# Patient Record
Sex: Female | Born: 1940 | Race: White | Hispanic: No | Marital: Single | Smoking: Never smoker
Health system: Southern US, Community
[De-identification: ages and names within clinical notes are randomized; demographics above are authoritative.]

## PROBLEM LIST (undated history)

## (undated) DIAGNOSIS — I639 Cerebral infarction, unspecified: Secondary | ICD-10-CM

## (undated) DIAGNOSIS — E039 Hypothyroidism, unspecified: Secondary | ICD-10-CM

## (undated) DIAGNOSIS — F411 Generalized anxiety disorder: Secondary | ICD-10-CM

## (undated) DIAGNOSIS — R259 Unspecified abnormal involuntary movements: Secondary | ICD-10-CM

## (undated) DIAGNOSIS — M6530 Trigger finger, unspecified finger: Secondary | ICD-10-CM

## (undated) DIAGNOSIS — M1990 Unspecified osteoarthritis, unspecified site: Secondary | ICD-10-CM

## (undated) DIAGNOSIS — Z780 Asymptomatic menopausal state: Secondary | ICD-10-CM

## (undated) DIAGNOSIS — G2581 Restless legs syndrome: Secondary | ICD-10-CM

## (undated) DIAGNOSIS — R51 Headache: Secondary | ICD-10-CM

## (undated) DIAGNOSIS — R000 Tachycardia, unspecified: Secondary | ICD-10-CM

## (undated) DIAGNOSIS — H269 Unspecified cataract: Secondary | ICD-10-CM

## (undated) DIAGNOSIS — L309 Dermatitis, unspecified: Secondary | ICD-10-CM

## (undated) DIAGNOSIS — H9190 Unspecified hearing loss, unspecified ear: Secondary | ICD-10-CM

## (undated) DIAGNOSIS — F70 Mild intellectual disabilities: Secondary | ICD-10-CM

## (undated) DIAGNOSIS — L03039 Cellulitis of unspecified toe: Secondary | ICD-10-CM

## (undated) DIAGNOSIS — E785 Hyperlipidemia, unspecified: Secondary | ICD-10-CM

## (undated) DIAGNOSIS — I10 Essential (primary) hypertension: Secondary | ICD-10-CM

## (undated) HISTORY — DX: Generalized anxiety disorder: F41.1

## (undated) HISTORY — DX: Trigger finger, unspecified finger: M65.30

## (undated) HISTORY — DX: Tachycardia, unspecified: R00.0

## (undated) HISTORY — DX: Cellulitis of unspecified toe: L03.039

## (undated) HISTORY — PX: BELPHAROPTOSIS REPAIR: SHX369

## (undated) HISTORY — DX: Unspecified osteoarthritis, unspecified site: M19.90

## (undated) HISTORY — DX: Restless legs syndrome: G25.81

## (undated) HISTORY — DX: Dermatitis, unspecified: L30.9

## (undated) HISTORY — DX: Headache: R51

## (undated) HISTORY — PX: CATARACT EXTRACTION, BILATERAL: SHX1313

## (undated) HISTORY — DX: Cerebral infarction, unspecified: I63.9

## (undated) HISTORY — DX: Asymptomatic menopausal state: Z78.0

## (undated) HISTORY — DX: Hypothyroidism, unspecified: E03.9

## (undated) HISTORY — DX: Unspecified abnormal involuntary movements: R25.9

## (undated) HISTORY — DX: Hyperlipidemia, unspecified: E78.5

## (undated) HISTORY — DX: Essential (primary) hypertension: I10

## (undated) HISTORY — DX: Unspecified hearing loss, unspecified ear: H91.90

## (undated) HISTORY — PX: COLONOSCOPY: SHX174

## (undated) HISTORY — DX: Unspecified cataract: H26.9

## (undated) HISTORY — DX: Mild intellectual disabilities: F70

---

## 2002-06-06 ENCOUNTER — Encounter: Admission: RE | Admit: 2002-06-06 | Discharge: 2002-06-06 | Payer: Self-pay | Admitting: Endocrinology

## 2002-06-06 ENCOUNTER — Encounter: Payer: Self-pay | Admitting: Endocrinology

## 2004-08-11 ENCOUNTER — Encounter: Payer: Self-pay | Admitting: Endocrinology

## 2005-07-29 ENCOUNTER — Ambulatory Visit: Payer: Self-pay | Admitting: Endocrinology

## 2006-05-03 ENCOUNTER — Ambulatory Visit: Payer: Self-pay | Admitting: Endocrinology

## 2006-05-10 ENCOUNTER — Ambulatory Visit: Payer: Self-pay | Admitting: Internal Medicine

## 2006-05-21 ENCOUNTER — Ambulatory Visit: Payer: Self-pay | Admitting: Internal Medicine

## 2006-05-31 ENCOUNTER — Ambulatory Visit: Payer: Self-pay | Admitting: Endocrinology

## 2006-06-04 ENCOUNTER — Ambulatory Visit: Payer: Self-pay | Admitting: Internal Medicine

## 2006-06-04 LAB — HM COLONOSCOPY

## 2006-08-25 ENCOUNTER — Ambulatory Visit: Payer: Self-pay | Admitting: Endocrinology

## 2006-10-13 ENCOUNTER — Ambulatory Visit: Payer: Self-pay | Admitting: Endocrinology

## 2006-12-01 ENCOUNTER — Ambulatory Visit: Payer: Self-pay | Admitting: Endocrinology

## 2006-12-06 ENCOUNTER — Encounter: Payer: Self-pay | Admitting: Endocrinology

## 2006-12-06 LAB — CONVERTED CEMR LAB
Metaneph Total, Ur: 142 ug/24hr (ref 95–475)
Metanephrines, Ur: 24 (ref 19–140)
Norepinephrine 24 Hr Urine: 15 mcg/24hr (ref <80)
Normetanephrine, 24H Ur: 118 (ref 52–310)

## 2007-01-05 ENCOUNTER — Ambulatory Visit: Payer: Self-pay | Admitting: Endocrinology

## 2007-04-06 ENCOUNTER — Ambulatory Visit: Payer: Self-pay | Admitting: Endocrinology

## 2007-04-27 ENCOUNTER — Ambulatory Visit: Payer: Self-pay | Admitting: Endocrinology

## 2007-05-11 ENCOUNTER — Ambulatory Visit: Payer: Self-pay | Admitting: Endocrinology

## 2007-06-06 ENCOUNTER — Ambulatory Visit: Payer: Self-pay | Admitting: Endocrinology

## 2007-06-22 ENCOUNTER — Encounter: Payer: Self-pay | Admitting: Endocrinology

## 2007-06-22 DIAGNOSIS — E785 Hyperlipidemia, unspecified: Secondary | ICD-10-CM

## 2007-06-22 HISTORY — DX: Hyperlipidemia, unspecified: E78.5

## 2007-07-06 ENCOUNTER — Ambulatory Visit: Payer: Self-pay | Admitting: Endocrinology

## 2007-08-26 ENCOUNTER — Ambulatory Visit (HOSPITAL_COMMUNITY): Payer: Self-pay | Admitting: Psychiatry

## 2007-09-19 ENCOUNTER — Ambulatory Visit: Payer: Self-pay | Admitting: Endocrinology

## 2007-10-10 ENCOUNTER — Ambulatory Visit: Payer: Self-pay | Admitting: Endocrinology

## 2007-10-10 DIAGNOSIS — F411 Generalized anxiety disorder: Secondary | ICD-10-CM | POA: Insufficient documentation

## 2007-10-10 HISTORY — DX: Generalized anxiety disorder: F41.1

## 2007-11-09 ENCOUNTER — Encounter: Payer: Self-pay | Admitting: Internal Medicine

## 2007-11-21 ENCOUNTER — Ambulatory Visit: Payer: Self-pay | Admitting: Endocrinology

## 2007-11-21 DIAGNOSIS — R259 Unspecified abnormal involuntary movements: Secondary | ICD-10-CM

## 2007-11-21 HISTORY — DX: Unspecified abnormal involuntary movements: R25.9

## 2007-12-19 ENCOUNTER — Encounter: Payer: Self-pay | Admitting: Endocrinology

## 2007-12-29 ENCOUNTER — Encounter: Payer: Self-pay | Admitting: Endocrinology

## 2008-01-16 ENCOUNTER — Ambulatory Visit: Payer: Self-pay | Admitting: Endocrinology

## 2008-02-27 ENCOUNTER — Ambulatory Visit: Payer: Self-pay | Admitting: Endocrinology

## 2008-04-25 ENCOUNTER — Telehealth: Payer: Self-pay | Admitting: Endocrinology

## 2008-05-07 ENCOUNTER — Ambulatory Visit: Payer: Self-pay | Admitting: Endocrinology

## 2008-05-07 DIAGNOSIS — I10 Essential (primary) hypertension: Secondary | ICD-10-CM

## 2008-05-07 HISTORY — DX: Essential (primary) hypertension: I10

## 2008-05-28 ENCOUNTER — Ambulatory Visit: Payer: Self-pay | Admitting: Endocrinology

## 2008-07-02 ENCOUNTER — Ambulatory Visit: Payer: Self-pay | Admitting: Endocrinology

## 2008-07-23 ENCOUNTER — Encounter: Payer: Self-pay | Admitting: Endocrinology

## 2008-08-06 ENCOUNTER — Ambulatory Visit: Payer: Self-pay | Admitting: Endocrinology

## 2008-08-06 ENCOUNTER — Encounter: Payer: Self-pay | Admitting: Internal Medicine

## 2008-08-27 ENCOUNTER — Ambulatory Visit: Payer: Self-pay | Admitting: Endocrinology

## 2008-08-27 DIAGNOSIS — R519 Headache, unspecified: Secondary | ICD-10-CM | POA: Insufficient documentation

## 2008-08-27 DIAGNOSIS — R51 Headache: Secondary | ICD-10-CM

## 2008-08-27 DIAGNOSIS — R000 Tachycardia, unspecified: Secondary | ICD-10-CM

## 2008-08-27 HISTORY — DX: Tachycardia, unspecified: R00.0

## 2008-08-27 HISTORY — DX: Headache: R51

## 2008-09-04 ENCOUNTER — Encounter (INDEPENDENT_AMBULATORY_CARE_PROVIDER_SITE_OTHER): Payer: Self-pay | Admitting: *Deleted

## 2008-09-06 ENCOUNTER — Encounter: Payer: Self-pay | Admitting: Endocrinology

## 2008-09-12 ENCOUNTER — Encounter: Payer: Self-pay | Admitting: Endocrinology

## 2008-09-14 ENCOUNTER — Telehealth: Payer: Self-pay | Admitting: Endocrinology

## 2008-09-14 ENCOUNTER — Encounter (INDEPENDENT_AMBULATORY_CARE_PROVIDER_SITE_OTHER): Payer: Self-pay | Admitting: *Deleted

## 2008-10-01 ENCOUNTER — Ambulatory Visit: Payer: Self-pay | Admitting: Endocrinology

## 2008-10-01 DIAGNOSIS — G2581 Restless legs syndrome: Secondary | ICD-10-CM

## 2008-10-01 HISTORY — DX: Restless legs syndrome: G25.81

## 2008-11-05 ENCOUNTER — Encounter: Payer: Self-pay | Admitting: Endocrinology

## 2008-11-12 ENCOUNTER — Encounter: Admission: RE | Admit: 2008-11-12 | Discharge: 2008-11-12 | Payer: Self-pay | Admitting: Neurology

## 2008-12-03 ENCOUNTER — Encounter: Payer: Self-pay | Admitting: Endocrinology

## 2008-12-07 ENCOUNTER — Telehealth: Payer: Self-pay | Admitting: Endocrinology

## 2008-12-07 ENCOUNTER — Encounter (INDEPENDENT_AMBULATORY_CARE_PROVIDER_SITE_OTHER): Payer: Self-pay | Admitting: *Deleted

## 2009-02-18 ENCOUNTER — Encounter: Payer: Self-pay | Admitting: Endocrinology

## 2009-03-14 ENCOUNTER — Encounter: Payer: Self-pay | Admitting: Endocrinology

## 2009-03-22 ENCOUNTER — Ambulatory Visit: Payer: Self-pay | Admitting: Endocrinology

## 2009-04-15 ENCOUNTER — Ambulatory Visit: Payer: Self-pay | Admitting: Endocrinology

## 2009-04-15 DIAGNOSIS — Z780 Asymptomatic menopausal state: Secondary | ICD-10-CM

## 2009-04-15 DIAGNOSIS — R9431 Abnormal electrocardiogram [ECG] [EKG]: Secondary | ICD-10-CM

## 2009-04-15 DIAGNOSIS — E039 Hypothyroidism, unspecified: Secondary | ICD-10-CM | POA: Insufficient documentation

## 2009-04-15 HISTORY — DX: Asymptomatic menopausal state: Z78.0

## 2009-04-15 HISTORY — DX: Hypothyroidism, unspecified: E03.9

## 2009-05-06 ENCOUNTER — Ambulatory Visit: Payer: Self-pay

## 2009-05-06 ENCOUNTER — Encounter: Payer: Self-pay | Admitting: Endocrinology

## 2009-07-08 ENCOUNTER — Ambulatory Visit: Payer: Self-pay | Admitting: Endocrinology

## 2009-09-02 ENCOUNTER — Telehealth: Payer: Self-pay | Admitting: Endocrinology

## 2009-09-03 ENCOUNTER — Telehealth: Payer: Self-pay | Admitting: Endocrinology

## 2009-10-30 ENCOUNTER — Ambulatory Visit: Payer: Self-pay | Admitting: Endocrinology

## 2009-10-30 DIAGNOSIS — M6530 Trigger finger, unspecified finger: Secondary | ICD-10-CM | POA: Insufficient documentation

## 2009-10-30 HISTORY — DX: Trigger finger, unspecified finger: M65.30

## 2009-10-30 LAB — CONVERTED CEMR LAB
Cholesterol: 306 mg/dL — ABNORMAL HIGH (ref 0–200)
Direct LDL: 164.3 mg/dL
HDL: 44.7 mg/dL (ref >39.00)
TSH: 6.96 microintl units/mL — ABNORMAL HIGH (ref 0.35–5.50)
Total CHOL/HDL Ratio: 7
Triglycerides: 429 mg/dL — ABNORMAL HIGH (ref 0.0–149.0)
VLDL: 85.8 mg/dL — ABNORMAL HIGH (ref 0.0–40.0)

## 2009-11-06 ENCOUNTER — Telehealth: Payer: Self-pay | Admitting: Endocrinology

## 2009-12-23 ENCOUNTER — Ambulatory Visit: Payer: Self-pay | Admitting: Endocrinology

## 2009-12-23 DIAGNOSIS — L03039 Cellulitis of unspecified toe: Secondary | ICD-10-CM

## 2009-12-23 HISTORY — DX: Cellulitis of unspecified toe: L03.039

## 2010-04-10 ENCOUNTER — Telehealth: Payer: Self-pay | Admitting: Endocrinology

## 2010-07-01 ENCOUNTER — Encounter: Payer: Self-pay | Admitting: Endocrinology

## 2010-07-04 ENCOUNTER — Ambulatory Visit: Payer: Self-pay | Admitting: Endocrinology

## 2010-07-07 ENCOUNTER — Telehealth: Payer: Self-pay | Admitting: Internal Medicine

## 2010-07-17 ENCOUNTER — Encounter (INDEPENDENT_AMBULATORY_CARE_PROVIDER_SITE_OTHER): Payer: Self-pay | Admitting: *Deleted

## 2010-08-07 ENCOUNTER — Telehealth: Payer: Self-pay | Admitting: Endocrinology

## 2010-08-13 ENCOUNTER — Encounter: Payer: Self-pay | Admitting: Endocrinology

## 2010-08-28 ENCOUNTER — Encounter: Payer: Self-pay | Admitting: Endocrinology

## 2010-09-04 ENCOUNTER — Encounter: Payer: Self-pay | Admitting: Endocrinology

## 2010-09-08 ENCOUNTER — Encounter (INDEPENDENT_AMBULATORY_CARE_PROVIDER_SITE_OTHER): Payer: Self-pay | Admitting: *Deleted

## 2010-09-09 ENCOUNTER — Encounter (INDEPENDENT_AMBULATORY_CARE_PROVIDER_SITE_OTHER): Payer: Self-pay | Admitting: *Deleted

## 2010-09-09 ENCOUNTER — Telehealth: Payer: Self-pay | Admitting: Endocrinology

## 2010-09-15 ENCOUNTER — Ambulatory Visit: Payer: Self-pay | Admitting: Endocrinology

## 2010-09-18 ENCOUNTER — Encounter: Payer: Self-pay | Admitting: Endocrinology

## 2010-09-19 ENCOUNTER — Telehealth: Payer: Self-pay | Admitting: Endocrinology

## 2010-09-19 ENCOUNTER — Encounter (INDEPENDENT_AMBULATORY_CARE_PROVIDER_SITE_OTHER): Payer: Self-pay | Admitting: *Deleted

## 2010-09-22 ENCOUNTER — Encounter: Payer: Self-pay | Admitting: Endocrinology

## 2010-09-25 ENCOUNTER — Encounter: Payer: Self-pay | Admitting: Endocrinology

## 2010-10-02 ENCOUNTER — Encounter: Payer: Self-pay | Admitting: Endocrinology

## 2010-10-03 ENCOUNTER — Encounter: Payer: Self-pay | Admitting: Endocrinology

## 2010-10-07 ENCOUNTER — Encounter: Payer: Self-pay | Admitting: Endocrinology

## 2010-10-08 ENCOUNTER — Encounter (INDEPENDENT_AMBULATORY_CARE_PROVIDER_SITE_OTHER): Payer: Self-pay | Admitting: *Deleted

## 2010-10-08 ENCOUNTER — Encounter: Payer: Self-pay | Admitting: Endocrinology

## 2010-10-13 ENCOUNTER — Encounter: Payer: Self-pay | Admitting: Endocrinology

## 2010-10-13 ENCOUNTER — Encounter (INDEPENDENT_AMBULATORY_CARE_PROVIDER_SITE_OTHER): Payer: Self-pay | Admitting: *Deleted

## 2010-10-14 ENCOUNTER — Encounter (INDEPENDENT_AMBULATORY_CARE_PROVIDER_SITE_OTHER): Payer: Self-pay | Admitting: *Deleted

## 2010-10-15 ENCOUNTER — Encounter (INDEPENDENT_AMBULATORY_CARE_PROVIDER_SITE_OTHER): Payer: Self-pay | Admitting: *Deleted

## 2010-10-20 ENCOUNTER — Encounter: Payer: Self-pay | Admitting: Endocrinology

## 2010-10-22 ENCOUNTER — Encounter (INDEPENDENT_AMBULATORY_CARE_PROVIDER_SITE_OTHER): Payer: Self-pay | Admitting: *Deleted

## 2010-10-23 ENCOUNTER — Ambulatory Visit: Payer: Self-pay | Admitting: Endocrinology

## 2010-10-24 ENCOUNTER — Encounter: Payer: Self-pay | Admitting: Endocrinology

## 2010-10-24 ENCOUNTER — Encounter (INDEPENDENT_AMBULATORY_CARE_PROVIDER_SITE_OTHER): Payer: Self-pay | Admitting: *Deleted

## 2010-10-24 LAB — CONVERTED CEMR LAB: TSH: 1.38 microintl units/mL (ref 0.35–5.50)

## 2010-10-27 ENCOUNTER — Encounter: Payer: Self-pay | Admitting: Endocrinology

## 2010-10-27 ENCOUNTER — Encounter (INDEPENDENT_AMBULATORY_CARE_PROVIDER_SITE_OTHER): Payer: Self-pay | Admitting: *Deleted

## 2010-10-28 ENCOUNTER — Encounter: Payer: Self-pay | Admitting: Endocrinology

## 2010-10-28 ENCOUNTER — Encounter (INDEPENDENT_AMBULATORY_CARE_PROVIDER_SITE_OTHER): Payer: Self-pay | Admitting: *Deleted

## 2010-10-29 ENCOUNTER — Encounter: Payer: Self-pay | Admitting: Endocrinology

## 2010-10-29 ENCOUNTER — Encounter (INDEPENDENT_AMBULATORY_CARE_PROVIDER_SITE_OTHER): Payer: Self-pay | Admitting: *Deleted

## 2010-10-31 ENCOUNTER — Encounter: Payer: Self-pay | Admitting: Endocrinology

## 2010-10-31 ENCOUNTER — Encounter (INDEPENDENT_AMBULATORY_CARE_PROVIDER_SITE_OTHER): Payer: Self-pay | Admitting: *Deleted

## 2010-11-03 ENCOUNTER — Encounter: Payer: Self-pay | Admitting: Endocrinology

## 2010-11-04 ENCOUNTER — Encounter: Payer: Self-pay | Admitting: Endocrinology

## 2010-11-04 ENCOUNTER — Encounter (INDEPENDENT_AMBULATORY_CARE_PROVIDER_SITE_OTHER): Payer: Self-pay | Admitting: *Deleted

## 2010-11-06 ENCOUNTER — Encounter (INDEPENDENT_AMBULATORY_CARE_PROVIDER_SITE_OTHER): Payer: Self-pay | Admitting: *Deleted

## 2010-11-06 ENCOUNTER — Encounter: Payer: Self-pay | Admitting: Endocrinology

## 2010-11-25 ENCOUNTER — Encounter: Payer: Self-pay | Admitting: Endocrinology

## 2010-12-01 ENCOUNTER — Encounter: Payer: Self-pay | Admitting: Endocrinology

## 2010-12-02 ENCOUNTER — Telehealth: Payer: Self-pay | Admitting: Endocrinology

## 2010-12-02 ENCOUNTER — Encounter (INDEPENDENT_AMBULATORY_CARE_PROVIDER_SITE_OTHER): Payer: Self-pay | Admitting: *Deleted

## 2010-12-05 ENCOUNTER — Encounter (INDEPENDENT_AMBULATORY_CARE_PROVIDER_SITE_OTHER): Payer: Self-pay | Admitting: *Deleted

## 2010-12-05 ENCOUNTER — Encounter: Payer: Self-pay | Admitting: Endocrinology

## 2010-12-10 ENCOUNTER — Encounter: Payer: Self-pay | Admitting: Endocrinology

## 2010-12-15 ENCOUNTER — Encounter (INDEPENDENT_AMBULATORY_CARE_PROVIDER_SITE_OTHER): Payer: Self-pay | Admitting: *Deleted

## 2010-12-19 ENCOUNTER — Ambulatory Visit: Admit: 2010-12-19 | Payer: Self-pay | Admitting: Endocrinology

## 2010-12-21 LAB — CONVERTED CEMR LAB
BUN: 17 mg/dL (ref 6–23)
CO2: 33 meq/L — ABNORMAL HIGH (ref 19–32)
Calcium: 9.2 mg/dL (ref 8.4–10.5)
Chloride: 104 meq/L (ref 96–112)
Cholesterol: 297 mg/dL — ABNORMAL HIGH (ref 0–200)
Creatinine, Ser: 0.8 mg/dL (ref 0.4–1.2)
Direct LDL: 149.2 mg/dL
GFR calc non Af Amer: 76.26 mL/min (ref >60)
Glucose, Bld: 102 mg/dL — ABNORMAL HIGH (ref 70–99)
HDL: 46.2 mg/dL (ref >39.00)
Potassium: 4 meq/L (ref 3.5–5.1)
Sodium: 141 meq/L (ref 135–145)
TSH: 7.74 microintl units/mL — ABNORMAL HIGH (ref 0.35–5.50)
Total CHOL/HDL Ratio: 6
Triglycerides: 562 mg/dL — ABNORMAL HIGH (ref 0.0–149.0)
VLDL: 112.4 mg/dL — ABNORMAL HIGH (ref 0.0–40.0)

## 2010-12-23 NOTE — Letter (Signed)
Summary: Generic Letter  [facility]  [address] [place], [place] [address]   Phone: [phone]  Fax: [phone]    10/28/2010  Laura [name] [address] [place], [place]  [address]  Dear [name],   Dr. [name] wants you to continue to follow the advise of your neurologist, Dr. Terrace Arabia.        Sincerely,   Brenton Grills CMA (AAMA)

## 2010-12-23 NOTE — Letter (Signed)
Summary: Letter from patient regarding medications  Letter from patient regarding medications   Imported By: Sherian Rein 10/27/2010 07:49:44  _____________________________________________________________________  External Attachment:    Type:   Image     Comment:   External Document

## 2010-12-23 NOTE — Letter (Signed)
Summary: Generic Letter  [facility]  [address] [place], [place] [address]   Phone: [phone]  Fax: [phone]    10/13/2010  LAHNA NATH [address] [place], [place]  [address]  Dear [name],    Dr. [name] wants you to follow the advice of your neurologist, Dr. Terrace Arabia.      Sincerely,   Brenton Grills CMA (AAMA)/Dr. Romero Belling

## 2010-12-23 NOTE — Letter (Signed)
Summary: Letter from patient  Letter from patient   Imported By: Lester Valinda 10/06/2010 08:53:47  _____________________________________________________________________  External Attachment:    Type:   Image     Comment:   External Document

## 2010-12-23 NOTE — Progress Notes (Signed)
Summary: rx refill req  Phone Note Call from Patient Call back at Home Phone [phone]   Caller: Patient Summary of Call: Pt faxed letter requesting to have Requip and Temazepam refilled at [facility]. Per pharmacy they are unsure which medication to fill for pt *see phone note 08/15 and 09/15*. Please advise on correct medication and dosage. Initial call taken by: Margaret Pyle, CMA,  September 09, 2010 1:47 PM  Follow-up for Phone Call        i updated med list, per advice of neurologist.  i have printed a refill of temazepam.  ov needed for thyroi. Follow-up by: Minus Breeding MD,  September 09, 2010 2:22 PM  Additional Follow-up for Phone Call Additional follow up Details #1::        rx sent to [facility]. Pt informed by fax per her request. Additional Follow-up by: Brenton Grills MA,  September 09, 2010 3:25 PM    New/Updated Medications: NORTRIPTYLINE HCL 10 MG CAPS (NORTRIPTYLINE HCL) 2 tabs at bedtime TRAZODONE HCL 100 MG TABS (TRAZODONE HCL) 1 tab at bedtime Prescriptions: TEMAZEPAM 15 MG CAPS (TEMAZEPAM) 1 tab three times a day as needed for tremor  #90 x 0   Entered and Authorized by:   Minus Breeding MD   Signed by:   Minus Breeding MD on 09/09/2010   Method used:   Print then Give to Patient   RxID:   [id number]

## 2010-12-23 NOTE — Letter (Signed)
Summary: Generic Letter  [facility]  [address] [place], [place] [address]   Phone: [phone]  Fax: [phone]    10/31/2010  Laura [name] [address] [place], [place]  [address]  Dear [name],   Dr. [name] has refilled your prescription for Temazepam. It has been sent to your [facility].        Sincerely,   Brenton Grills CMA (AAMA)

## 2010-12-23 NOTE — Letter (Signed)
Summary: Generic Letter  [facility]  [address] [place], [place] [address]   Phone: [phone]  Fax: [phone]    10/08/2010  Laura [name] [address] [place], [place]  [address]  Dear [name],   Dr. [name] wants you to follow the advice of your neurologist, Dr. Terrace Arabia.        Sincerely,   Brenton Grills CMA (AAMA/)/Dr. Romero Belling

## 2010-12-23 NOTE — Assessment & Plan Note (Signed)
Summary: follow up/interpreter informed/lb   Vital Signs:  Patient profile:   70 year old female Height:      63 inches (160.02 cm) Weight:      129.13 pounds (58.70 kg) BMI:     22.96 O2 Sat:      95 % on Room air Temp:     98.7 degrees F (37.06 degrees C) oral Pulse rate:   96 / minute BP sitting:   136 / 88  (left arm) Cuff size:   regular  Vitals Entered By: Brenton Grills MA (September 15, 2010 1:18 PM)  O2 Flow:  Room air CC: Follow-up visit/aj Is Patient Diabetic? No   CC:  Follow-up visit/aj.  History of Present Illness: pt was noted at neurol to have elevated tsh.  it has been elevated here, but not as high.  pt has never been on thyroid medication.  she c/o dry mouth.    Current Medications (verified): 1)  Multivitamins   Tabs (Multiple Vitamin) .... Take 1 By Mouth Qd 2)  Metoprolol Tartrate 25 Mg  Tabs (Metoprolol Tartrate) .... 1/2 Bid 3)  Temazepam 15 Mg Caps (Temazepam) .Marland Kitchen.. 1 Tab Three Times A Day As Needed For Tremor 4)  Nortriptyline Hcl 10 Mg Caps (Nortriptyline Hcl) .... 2 Tabs At Bedtime 5)  Trazodone Hcl 100 Mg Tabs (Trazodone Hcl) .Marland Kitchen.. 1 Tab At Bedtime 6)  Vitamin E 100 Unit Caps (Vitamin E) .Marland Kitchen.. 1 By Mouth Two Times A Day  Allergies (verified): No Known Drug Allergies  Past History:  Past Medical History: Last updated: 06/22/2007 Hyperlipidemia ( Rx refused) Deaf Mild retardation Dermatitis Tremors Restless legs syndrome  Family History: Reviewed history and no changes required. no thyroid dz  Review of Systems       she denies weight gain/constipation/arthralgias/fatigue/depression  Physical Exam  General:  normal appearance.   Neck:  Supple without thyroid enlargement or tenderness.  Additional Exam:  outside test results are reviewed: tsh=11   Impression & Recommendations:  Problem # 1:  HYPOTHYROIDISM (ICD-244.9) Assessment Deteriorated  Medications Added to Medication List This Visit: 1)  Vitamin E 100 Unit Caps  (Vitamin e) .Marland Kitchen.. 1 by mouth two times a day 2)  Levothyroxine Sodium 50 Mcg Tabs (Levothyroxine sodium) .Marland Kitchen.. 1 tab once daily  Other Orders: Est. Patient Level III ([address])  Patient Instructions: 1)  levothyroxine 50 micrograms/day. 2)  Please schedule a "medicare wellness" appointment in 3 months. Prescriptions: TRAZODONE HCL 100 MG TABS (TRAZODONE HCL) 1 tab at bedtime  #30 x 11   Entered and Authorized by:   Minus Breeding MD   Signed by:   Minus Breeding MD on 09/18/2010   Method used:   Electronically to        [facility] [address] (retail)       [phone]       [place], [place]  [address]       Ph: [phone]       Fax: [phone]   RxID:   [id number] LEVOTHYROXINE SODIUM 50 MCG TABS (LEVOTHYROXINE SODIUM) 1 tab once daily  #30 x 5   Entered and Authorized by:   Minus Breeding MD   Signed by:   Minus Breeding MD on 09/15/2010   Method used:   Electronically to        [facility] [address] (retail)       [phone]  [place], [place]  [address]       Ph: [phone]       Fax: [phone]   RxID:   [id number]    Orders Added: 1)  Est. Patient Level III [[address]]

## 2010-12-23 NOTE — Letter (Signed)
Summary: Generic Letter  [facility]  [address] [place], [place] [address]   Phone: [phone]  Fax: [phone]    10/15/2010  AVAGAIL WHITTLESEY [address] [place], [place]  [address]  Dear [name],   Dr. [name] wants you to continue to follow the advice of your neurologist, Dr. Terrace Arabia.        Sincerely,   Brenton Grills CMA (AAMA)

## 2010-12-23 NOTE — Medication Information (Signed)
Summary: Rx Request/Patient  Rx Request/Patient   Imported By: Sherian Rein 09/15/2010 15:56:40  _____________________________________________________________________  External Attachment:    Type:   Image     Comment:   External Document

## 2010-12-23 NOTE — Letter (Signed)
Summary: Generic Letter  [facility]  [address] [place], [place] [address]   Phone: [phone]  Fax: [phone]    10/29/2010  MIKI LABUDA [address] [place], [place]  [address]  Dear [name],   Dr. Zannie Cove fax number is [phone]. I have called and verified with his office that this fax number is correct.         Sincerely,   Brenton Grills CMA (AAMA)

## 2010-12-23 NOTE — Letter (Signed)
Summary: Generic Letter   [facility]  [address] [place], [place] [address]   Phone: [phone]  Fax: [phone]    09/08/2010  Laura [name] [address] [place], [place]  [address]  Dear [name],   We have received the results of your thyroid test from Dr. Terrace Arabia. Dr. [name] would like you to make an appointment to address the results of your thyroid test. Please contact the office to schedule an appointment with Dr. [name] at your earliest [place]. You can reach [place] at [phone] to schedule an appointment.          Sincerely,   Brenton Grills MA/Dr. Romero Belling

## 2010-12-23 NOTE — Letter (Signed)
Summary: Generic Letter  [facility]  [address] [place], [place] [address]   Phone: [phone]  Fax: [phone]    10/22/2010  Laura [name] [address] [place], [place]  [address]  Dear [name],  Dr. [name] would like you to come in for labwork to check your thyroid. The laboratory is open from 7:30am-5:30pm M-F. You do not need to make an appointment, you can come in anytime that is convenient for you. Dr. [name] also states that you should follow the instructions of your neurologist Dr. Terrace Arabia.         Sincerely,   Brenton Grills CMA (AAMA)

## 2010-12-23 NOTE — Progress Notes (Signed)
Summary: temazepam  Phone Note From Pharmacy   Caller: [facility] [address] [place] Details for Reason: ? temazepam Summary of Call: Pharmacy is calling about temazepam rx that was written on 07/04/10. Is this ok to fill for tremors. Pt is at pharmacy trying to get rx fill. Pharmacist Trey Paula) states this is unusal for pt to take for tremors three times a day. Is md ok filling med. Initial call taken by: Orlan Leavens RMA,  August 07, 2010 9:45 AM  Follow-up for Phone Call        ok Follow-up by: Minus Breeding MD,  August 07, 2010 11:13 AM  Additional Follow-up for Phone Call Additional follow up Details #1::        Notified Trey Paula with md response Additional Follow-up by: Orlan Leavens RMA,  August 07, 2010 11:43 AM

## 2010-12-23 NOTE — Letter (Signed)
Summary: Request for new OV instruction form/Patient  Request for new OV instruction form/Patient   Imported By: Sherian Rein 09/05/2010 09:37:28  _____________________________________________________________________  External Attachment:    Type:   Image     Comment:   External Document

## 2010-12-23 NOTE — Letter (Signed)
Summary: Letter regarding shaking legs/Patient  Letter regarding shaking legs/Patient   Imported By: Sherian Rein 07/08/2010 12:08:03  _____________________________________________________________________  External Attachment:    Type:   Image     Comment:   External Document

## 2010-12-23 NOTE — Letter (Signed)
Summary: ELAM Consult Scheduled Letter  [facility]  [address] [place], [place] [address]   Phone: [phone]  Fax: [phone]      07/17/2010 MRN: [id number]  ADEA GEISEL [address] [place], [place]  [address]    Dear [name],      We have scheduled an appointment for you.  At the recommendation of Dr.Ellsion, we have scheduled you a consult with Dr.Yan on 08-25-2010 at 1:30pm.Their phone number is [phone].If this appointment day and time is not convenient for you, please feel free to call the office of the doctor you are being referred to at the number listed above and reschedule the appointment.  [facility]   [address], [place], [place] [address]  Thank you,  Patient Care Coordinator  [facility]

## 2010-12-23 NOTE — Letter (Signed)
Summary: Letter faxed regarding medication/Patient  Letter faxed regarding medication/Patient   Imported By: Sherian Rein 10/30/2010 12:16:16  _____________________________________________________________________  External Attachment:    Type:   Image     Comment:   External Document

## 2010-12-23 NOTE — Letter (Signed)
Summary: Generic Letter  [facility]  [address] [place], [place] [address]   Phone: [phone]  Fax: [phone]    09/09/2010  SHETARA LAUNER [address] [place], [place]  [address]  Dear [name],  We have sent a prescription for Temezepam to your [facility] pharmacy. Your prescription for Metoprolol was sent last week and should be ready to pick up. Please contact the office to schedule an appointment with Dr. [name] to follow up on your thyroid.         Sincerely,   Brenton Grills MA/Dr.Sean [name]

## 2010-12-23 NOTE — Assessment & Plan Note (Signed)
Summary: RIGHT GREAT TOE HURTING AND SWOLLEN-LB-INTERPRETER SCH-LB   Vital Signs:  Patient profile:   70 year old female Height:      63 inches (160.02 cm) Weight:      127.50 pounds (57.95 kg) O2 Sat:      96 % on Room air Temp:     97.3 degrees F (36.28 degrees C) oral Pulse rate:   96 / minute BP sitting:   122 / 80  (left arm) Cuff size:   regular  Vitals Entered By: Josph Macho CMA (December 23, 2009 1:05 PM)  O2 Flow:  Room air CC: Right great toe hurting and swollen/ CF Is Patient Diabetic? No   CC:  Right great toe hurting and swollen/ CF.  History of Present Illness: 3 days of pain at the right great toe paronychial area.    Current Medications (verified): 1)  Multivitamins   Tabs (Multiple Vitamin) .... Take 1 By Mouth Qd 2)  Metoprolol Tartrate 25 Mg  Tabs (Metoprolol Tartrate) .... 1/2 Bid 3)  Clonazepam 0.5 Mg Tabs (Clonazepam) .Marland Kitchen.. 1 Three Times A Day As Needed For Tremor  Allergies (verified): No Known Drug Allergies  Past History:  Past Medical History: Last updated: 06/22/2007 Hyperlipidemia ( Rx refused) Deaf Mild retardation Dermatitis Tremors Restless legs syndrome  Review of Systems  The patient denies fever.    Physical Exam  General:  normal appearance.   Extremities:  left great toe paronychial area:  has slight erythema and swelling, but no drainage.  the nail in ingrown.     Impression & Recommendations:  Problem # 1:  PARONYCHIA, RIGHT GREAT TOE (ICD-681.11)  Medications Added to Medication List This Visit: 1)  Cephalexin 250 Mg Caps (Cephalexin) .Marland Kitchen.. 1 tid  Other Orders: Podiatry Referral (Podiatry) Est. Patient Level III ([address])  Patient Instructions: 1)  cephalexin 250 mg two times a day 2)  refer podiatry. Prescriptions: CEPHALEXIN 250 MG CAPS (CEPHALEXIN) 1 tid  #21 x 0   Entered and Authorized by:   Minus Breeding MD   Signed by:   Minus Breeding MD on 12/23/2009   Method used:   Electronically to   [facility] [address] (retail)       [phone]       [place], [place]  [address]       Ph: [phone]       Fax: [phone]   RxID:   [id number]

## 2010-12-23 NOTE — Letter (Signed)
Summary: Generic Letter  [facility]  [address] [place], [place] [address]   Phone: [phone]  Fax: [phone]    10/14/2010  Laura [name] [address] [place], [place]  [address]  Dear [name],   Dr. [name] wants you to follow the advice of your neurologist, Dr. Terrace Arabia. Dr. [name] wants you to continue to take Levothyroxine once daily.        Sincerely,   Brenton Grills CMA (AAMA)

## 2010-12-23 NOTE — Letter (Signed)
Summary: Letter faxed by patient regarding medications  Letter faxed by patient regarding medications   Imported By: Sherian Rein 10/29/2010 07:12:46  _____________________________________________________________________  External Attachment:    Type:   Image     Comment:   External Document

## 2010-12-23 NOTE — Consult Note (Signed)
Summary: [facility]  [facility]   Imported By: Lester [place] 09/05/2010 10:45:50  _____________________________________________________________________  External Attachment:    Type:   Image     Comment:   External Document

## 2010-12-23 NOTE — Letter (Signed)
Summary: Generic Letter  [facility]  [address] [address] [place], [place] [address]   Phone: [phone]  Fax: [phone]    09/19/2010  Laura [name] [address] [place], [place]  [address]  Dear [name],    We have sent the prescription for Trazadone to your [facility] pharmacy.       Sincerely,   Brenton Grills CMA (AAMA)/Dr. [name]

## 2010-12-23 NOTE — Miscellaneous (Signed)
   Clinical Lists Changes  Medications: Removed medication of TRAZODONE HCL 100 MG TABS (TRAZODONE HCL) 1 tab at bedtime

## 2010-12-23 NOTE — Letter (Signed)
Summary: Rx concerns from patient  Rx concerns from patient   Imported By: Lester [place] 10/20/2010 06:58:18  _____________________________________________________________________  External Attachment:    Type:   Image     Comment:   External Document

## 2010-12-23 NOTE — Letter (Signed)
Summary: Letter Re medicine refill  Letter Re medicine refill   Imported By: Lester [place] 09/22/2010 10:25:37  _____________________________________________________________________  External Attachment:    Type:   Image     Comment:   External Document

## 2010-12-23 NOTE — Letter (Signed)
Summary: Generic Letter  [facility]  [address] [place], [place] [address]   Phone: [phone]  Fax: [phone]    10/27/2010  Laura [name] [address] [place], [place]  [address]  Dear [name],   Dr. [name] advises that you schedule a follow up appointment with Dr. Terrace Arabia regarding your concerns.        Sincerely,   Brenton Grills CMA (AAMA)

## 2010-12-23 NOTE — Assessment & Plan Note (Signed)
Summary: FU/ LEGS ARE SHAKEY AND NERVOUS/CLONAZEPAM DIDN'T WORK/NWS   Vital Signs:  Patient profile:   70 year old female Height:      63 inches (160.02 cm) Weight:      131.25 pounds (59.66 kg) BMI:     23.33 O2 Sat:      97 % on Room air Temp:     98.0 degrees F (36.67 degrees C) oral Pulse rate:   73 / minute BP sitting:   130 / 82  (left arm) Cuff size:   regular  Vitals Entered By: Brenton Grills MA (July 04, 2010 1:59 PM)  O2 Flow:  Room air CC: Pt c/o restless legs at all times, states that Clonazepem is not working for her/pt is no longer taking Cephalexin/aj   CC:  Pt c/o restless legs at all times and states that Clonazepem is not working for her/pt is no longer taking Cephalexin/aj.  History of Present Illness: pt says klonopin does not help tremor, and causes drowsiness.    Current Medications (verified): 1)  Multivitamins   Tabs (Multiple Vitamin) .... Take 1 By Mouth Qd 2)  Metoprolol Tartrate 25 Mg  Tabs (Metoprolol Tartrate) .... 1/2 Bid 3)  Clonazepam 0.5 Mg Tabs (Clonazepam) .Marland Kitchen.. 1 Three Times A Day As Needed For Tremor 4)  Cephalexin 250 Mg Caps (Cephalexin) .Marland Kitchen.. 1 Tid  Allergies (verified): No Known Drug Allergies  Past History:  Past Medical History: Last updated: 06/22/2007 Hyperlipidemia ( Rx refused) Deaf Mild retardation Dermatitis Tremors Restless legs syndrome  Review of Systems       she has anxiety.  Physical Exam  General:  normal appearance.   Neurologic:  minimal tremor   Impression & Recommendations:  Problem # 1:  TREMOR (ICD-781.0) needs increased rx  Medications Added to Medication List This Visit: 1)  Temazepam 15 Mg Caps (Temazepam) .Marland Kitchen.. 1 tab three times a day as needed for tremor  Other Orders: Neurology Referral (Neuro) Est. Patient Level III ([address])  Patient Instructions: 1)  refer neurol. 2)  in the meantime, change clonazepam to temazepam 15 mg three times a day as needed for  tremor. Prescriptions: TEMAZEPAM 15 MG CAPS (TEMAZEPAM) 1 tab three times a day as needed for tremor  #90 x 1   Entered and Authorized by:   Minus Breeding MD   Signed by:   Minus Breeding MD on 07/04/2010   Method used:   Print then Give to Patient   RxID:   [id number]

## 2010-12-23 NOTE — Letter (Signed)
Summary: Letter from patient  Letter from patient   Imported By: Lester [place] 10/21/2010 11:55:09  _____________________________________________________________________  External Attachment:    Type:   Image     Comment:   External Document

## 2010-12-23 NOTE — Progress Notes (Signed)
Summary: refill req  Phone Note Call from Patient Call back at Home Phone [phone]   Caller: Patient Summary of Call: Pt sent fax requesting refill for Trazodone Initial call taken by: Brenton Grills CMA [place] Dull),  September 19, 2010 10:21 AM  Follow-up for Phone Call        rx sent to pharmacy, pt informed per letter by fax Follow-up by: Brenton Grills CMA Duncan Dull),  September 19, 2010 10:20 AM

## 2010-12-23 NOTE — Letter (Signed)
Summary: Letter from patient  Letter from patient   Imported By: Lester [place] 10/17/2010 08:53:24  _____________________________________________________________________  External Attachment:    Type:   Image     Comment:   External Document

## 2010-12-23 NOTE — Letter (Signed)
Summary: Letter requesting to stop Trazodine/Patient  Letter requesting to stop Trazodine/Patient   Imported By: Sherian Rein 09/24/2010 11:48:11  _____________________________________________________________________  External Attachment:    Type:   Image     Comment:   External Document

## 2010-12-23 NOTE — Progress Notes (Signed)
Summary: Rx clarificatiopn/SAE pt  Phone Note From Pharmacy   Caller: [facility] [address] [place] Summary of Call: Pharmacy called to verify Rx for Temazepam given to pt three times a day for tremor. Pharmacist states that this is a longer acting drug and is usually used for sleep. Please advise, pharmacist requires clarification before Rx is filled. Initial call taken by: Margaret Pyle, CMA,  July 07, 2010 9:36 AM  Follow-up for Phone Call        this is true, and I agree this would not be a good choice of med for tremor due to risk of somnolence;  pt has been on requip in the past, as well as klonopin  ? ok to re-try requip  ? or consider neuro consult for tremor Follow-up by: Corwin Levins MD,  July 07, 2010 1:19 PM    New/Updated Medications: REQUIP 1 MG TABS (ROPINIROLE HCL) 1 tab by mouth three times a day Please follow up with MD in 30 days Prescriptions: REQUIP 1 MG TABS (ROPINIROLE HCL) 1 tab by mouth three times a day Please follow up with MD in 30 days  #90 x 0   Entered by:   Margaret Pyle, CMA   Authorized by:   Corwin Levins MD   Signed by:   Margaret Pyle, CMA on 07/07/2010   Method used:   Electronically to        [facility] [address] (retail)       [phone]       [place], [place]  [address]       Ph: [phone]       Fax: [phone]   RxID:   [id number]

## 2010-12-23 NOTE — Letter (Signed)
Summary: Letter Regarding Legs & Medication / Patient   Letter Regarding Legs & Medication / Patient   Imported ByLennie Odor 08/26/2010 14:40:00  _____________________________________________________________________  External Attachment:    Type:   Image     Comment:   External Document

## 2010-12-23 NOTE — Letter (Signed)
Summary: Letter from patient  Letter from patient   Imported By: Lester [place] 10/17/2010 08:52:20  _____________________________________________________________________  External Attachment:    Type:   Image     Comment:   External Document

## 2010-12-23 NOTE — Progress Notes (Signed)
  Phone Note Refill Request Message from:  Fax from Pharmacy on Apr 10, 2010 9:43 AM  Refills Requested: Medication #1:  METOPROLOL TARTRATE 25 MG  TABS 1/2 bid Initial call taken by: Ami Bullins CMA,  Apr 10, 2010 9:44 AM    Prescriptions: METOPROLOL TARTRATE 25 MG  TABS (METOPROLOL TARTRATE) 1/2 bid  #30 x 4   Entered by:   Ami Bullins CMA   Authorized by:   Minus Breeding MD   Signed by:   Bill Salinas CMA on 04/10/2010   Method used:   Electronically to        [facility] [address] (retail)       [id number]       [place], [place]  [address]       Ph: [phone]       Fax: [phone]   RxID:   [id number]

## 2010-12-23 NOTE — Letter (Signed)
Summary: Letter regarding medication/Patient  Letter regarding medication/Patient   Imported By: Sherian Rein 10/13/2010 07:36:59  _____________________________________________________________________  External Attachment:    Type:   Image     Comment:   External Document

## 2010-12-23 NOTE — Miscellaneous (Signed)
   Clinical Lists Changes  Medications: Removed medication of NORTRIPTYLINE HCL 10 MG CAPS (NORTRIPTYLINE HCL) 2 tabs at bedtime

## 2010-12-23 NOTE — Letter (Signed)
Summary: Letter regarding wrong fax number/Patient  Letter regarding wrong fax number/Patient   Imported By: Sherian Rein 10/31/2010 12:26:39  _____________________________________________________________________  External Attachment:    Type:   Image     Comment:   External Document

## 2010-12-23 NOTE — Letter (Signed)
Summary: Generic Letter  [facility]  [address] [place], [place] [address]   Phone: [phone]  Fax: [phone]    10/24/2010  KAYLEEANN HUXFORD [address] [place], [place]  [address]  Dear [name],  Your thyroid blood test is normal. Please continue to take the same medication for your thyroid.         Sincerely,   Brenton Grills CMA (AAMA/)/Dr. Romero Belling

## 2010-12-25 NOTE — Letter (Signed)
Summary: Letter from patient  Letter from patient   Imported By: Lester [place] 11/11/2010 11:16:53  _____________________________________________________________________  External Attachment:    Type:   Image     Comment:   External Document

## 2010-12-25 NOTE — Letter (Signed)
Summary: Letter regarding medication/Patient  Letter regarding medication/Patient   Imported By: Sherian Rein 12/15/2010 09:29:54  _____________________________________________________________________  External Attachment:    Type:   Image     Comment:   External Document

## 2010-12-25 NOTE — Progress Notes (Signed)
Summary: rx refill req  Phone Note Call from Patient   Caller: Patient Summary of Call: Pt sent letter regarding requesting refill of Metoprolol Tartrate Initial call taken by: Brenton Grills CMA Duncan Dull),  December 02, 2010 11:49 AM  Follow-up for Phone Call        rx sent to pharmacy-pt informed by letter via fax Follow-up by: Brenton Grills CMA Duncan Dull),  December 02, 2010 11:50 AM    Prescriptions: METOPROLOL TARTRATE 25 MG  TABS (METOPROLOL TARTRATE) 1/2 bid  #30 Each x 5   Entered by:   Brenton Grills CMA (AAMA)   Authorized by:   Minus Breeding MD   Signed by:   Brenton Grills CMA (AAMA) on 12/02/2010   Method used:   Electronically to        [facility] [address] (retail)       [phone]       [place], [place]  [address]       Ph: [phone]       Fax: [phone]   RxID:   [id number]

## 2010-12-25 NOTE — Letter (Signed)
Summary: Generic Letter  [facility]  [address] [place], [place] [address]   Phone: [phone]  Fax: [phone]    12/15/2010  [name] [address] [place], [place]  [address]  Dear [name],   We have reschedule your appointment to January 27th at 1:45pm. If you are unable to make this appointment or if this time is inconvenient for you, please contact our office.        Sincerely,   Brenton Grills CMA (AAMA)

## 2010-12-25 NOTE — Letter (Signed)
Summary: Generic Letter  [facility]  [address] [place], [place] [address]   Phone: [phone]  Fax: [phone]    12/05/2010  Laura [name] [address] [place], [place]  [address]  Dear [name],  Your refill for Temazepam is not due yet. You should still have additional refills of your last prescription Dr. [name] sent in for you. Please contact your pharmacy to see if you have additional refills regarding your Temazepam.         Sincerely,   Brenton Grills CMA (AAMA)

## 2010-12-25 NOTE — Letter (Signed)
Summary: Generic Letter  [facility]  [address] [place], [place] [address]   Phone: [phone]  Fax: [phone]    12/02/2010  SHALAN NEAULT [address] [place], [place]  [address]  Dear [name],   Dr. [name] wants you to continue to follow the instructions of your neurologist, Dr. Terrace Arabia.        Sincerely,   Brenton Grills CMA (AAMA)

## 2010-12-25 NOTE — Letter (Signed)
Summary: Letter faxed regarding medication/Patient  Faxed letter regarding medication/Patient   Imported By: Sherian Rein 11/06/2010 10:13:56  _____________________________________________________________________  External Attachment:    Type:   Image     Comment:   External Document

## 2010-12-25 NOTE — Letter (Signed)
Summary: Generic Letter  [facility]  [address] [place], [place] [address]   Phone: [phone]  Fax: [phone]    12/02/2010  AMYRAH PINKHASOV [address] [place], [place]  [address]  Dear [name],  Your prescription for Metoprolol Tartrate has been sent to your [facility].         Sincerely,   Brenton Grills CMA (AAMA)

## 2010-12-25 NOTE — Letter (Signed)
Summary: Letter regarding medication/Patient  Letter regarding medication/Patient   Imported By: Sherian Rein 12/10/2010 14:19:47  _____________________________________________________________________  External Attachment:    Type:   Image     Comment:   External Document

## 2010-12-25 NOTE — Letter (Signed)
Summary: Letter from patient  Letter from patient   Imported By: Lester [place] 12/08/2010 09:23:24  _____________________________________________________________________  External Attachment:    Type:   Image     Comment:   External Document

## 2010-12-25 NOTE — Letter (Signed)
Summary: Letter regarding Temazepam/Patient  Letter regarding Temazepam/Patient   Imported By: Sherian Rein 11/07/2010 12:40:20  _____________________________________________________________________  External Attachment:    Type:   Image     Comment:   External Document

## 2010-12-25 NOTE — Letter (Signed)
Summary: Letter from patient  Letter from patient   Imported By: Lester Max 12/08/2010 09:22:36  _____________________________________________________________________  External Attachment:    Type:   Image     Comment:   External Document

## 2010-12-25 NOTE — Letter (Signed)
Summary: Generic Letter  [facility]  [address] [place], [place] [address]   Phone: [phone]  Fax: [phone]    11/06/2010  ELLIANAH CORDY [address] [place], [place]  [address]  Dear [name],    Your appointment with Dr. [name] is December 15, 2010 at 1:15pm.       Sincerely,   Brenton Grills CMA [facility])

## 2010-12-25 NOTE — Letter (Signed)
Summary: Letter regarding Temazepam/Patient  Letter regarding Temazepam/Patient   Imported By: Sherian Rein 12/10/2010 11:28:38  _____________________________________________________________________  External Attachment:    Type:   Image     Comment:   External Document

## 2010-12-25 NOTE — Letter (Signed)
Summary: Letter regarding headaches/Patient  Letter regarding headaches/Patient   Imported By: Sherian Rein 11/07/2010 12:38:58  _____________________________________________________________________  External Attachment:    Type:   Image     Comment:   External Document

## 2010-12-25 NOTE — Letter (Signed)
Summary: Generic Letter  [facility]  [address] [place], [place] [address]   Phone: [phone]  Fax: [phone]    11/04/2010  Laura [name] [address] [place], [place]  [address]  Dear [name],   Dr. [name] states that for headache medicine your options are to take Tylenol or to ask Dr. Terrace Arabia about options for headache medicine.        Sincerely,   Brenton Grills CMA (AAMA)

## 2010-12-25 NOTE — Letter (Signed)
Summary: Letter regarding appt/Patient  Letter regarding appt/Patient   Imported By: Sherian Rein 11/12/2010 11:36:19  _____________________________________________________________________  External Attachment:    Type:   Image     Comment:   External Document

## 2011-01-02 ENCOUNTER — Ambulatory Visit (INDEPENDENT_AMBULATORY_CARE_PROVIDER_SITE_OTHER): Payer: Medicare Other | Admitting: Endocrinology

## 2011-01-02 ENCOUNTER — Encounter: Payer: Self-pay | Admitting: Endocrinology

## 2011-01-02 DIAGNOSIS — Z0000 Encounter for general adult medical examination without abnormal findings: Secondary | ICD-10-CM

## 2011-01-08 NOTE — Assessment & Plan Note (Signed)
Summary: MEDICARE WELLNESS/ NWS  INTERPRETER SCHEDULED   Vital Signs:  Patient profile:   70 year old female Height:      63 inches (160.02 cm) Weight:      127.50 pounds (57.95 kg) BMI:     22.67 O2 Sat:      97 % on Room air Temp:     98.0 degrees F (36.67 degrees C) oral Pulse rate:   89 / minute Pulse rhythm:   regular BP sitting:   132 / 80  (left arm) Cuff size:   regular  Vitals Entered By: Brenton Grills CMA Duncan Dull) (January 02, 2011 1:18 PM)  O2 Flow:  Room air CC: Medicare Welllness/pt c/o trembling in legs/aj Is Patient Diabetic? No Comments Pt has not had a recent mammogram or Pap Smear or Zostavax   CC:  Medicare Welllness/pt c/o trembling in legs/aj.  History of Present Illness: pt is here for medicare welllness visit.  she denies memory loss and depression.  she says she is able to perform activities of daily living without assistance.  she has no limitations to physical activity.  Current Medications (verified): 1)  Multivitamins   Tabs (Multiple Vitamin) .... Take 1 By Mouth Qd 2)  Metoprolol Tartrate 25 Mg  Tabs (Metoprolol Tartrate) .... 1/2 Bid 3)  Temazepam 15 Mg Caps (Temazepam) .Marland Kitchen.. 1 Tab Three Times A Day As Needed For Tremor 4)  Vitamin E 100 Unit Caps (Vitamin E) .Marland Kitchen.. 1 By Mouth Two Times A Day 5)  Levothyroxine Sodium 50 Mcg Tabs (Levothyroxine Sodium) .Marland Kitchen.. 1 Tab Once Daily  Allergies (verified): No Known Drug Allergies  Past History:  Past Medical History: Hyperlipidemia ( Rx refused) Deaf Mild retardation Dermatitis Tremors Restless legs syndrome  podiatry: hyatt neurol: yan opthal: dolan  Family History: Reviewed history from 09/15/2010 and no changes required. no thyroid dz  Social History: Reviewed history from 10/01/2008 and no changes required. disabled Never Smoked does not have a phone at home alcohol: none no illegal drugs.   lives alone never married physical activity is very good she has never had a driver's  license  Review of Systems  The patient denies vision loss.         she has severe chronic hearing loss  Physical Exam  General:  normal appearance.   Ears:  (sees opthal) Msk:  pt easily and quickly performs "get-up-and-go" from a sitting position  Psych:  remembers 3/3 at 5 minutes.  excellent recall.  can easily read and write a sentence.  alert and oriented x 3.   Impression & Recommendations:  Problem # 1:  ROUTINE GENERAL MEDICAL EXAM@HEALTH  CARE FACL (ICD-V70.0)  Other Orders: Medicare -1st Annual Wellness Visit [phone])   Patient Instructions: 1)  please consider these measures for your health:  minimize alcohol.  do not use tobacco products.  have a colonoscopy at least every 10 years from age 19.  keep firearms safely stored.  always use seat belts.  have working smoke alarms in your home.  see an eye doctor and dentist regularly.  never drive under the influence of alcohol or drugs (including prescription drugs).  those with fair skin should take precautions against the sun. 2)  please let me know what your wishes would be, if artificial life support measures should become necessary.  it is critically important to prevent falling down (keep floor areas well-lit, dry, and free of loose objects). 3)  please let us know if you decide to get a mammogram, or  osteoporosis x ray. 4)  Please schedule a follow-up appointment in 6 months. 5)  (update: we discussed code status.  pt requests full code, but would not want to be started or maintained on artificial life-support measures if there was not a reasonable chance of recovery)   Orders Added: 1)  Medicare -1st Annual Wellness Visit [[id number]]   Immunization History:  Influenza Immunization History:    Influenza:  historical (08/23/2010)   Immunization History:  Influenza Immunization History:    Influenza:  Historical (08/23/2010)

## 2011-01-25 ENCOUNTER — Encounter: Payer: Self-pay | Admitting: Endocrinology

## 2011-02-10 NOTE — Letter (Signed)
Summary: Letter regarding Temazepam/Patient  Letter regarding Temazepam/Patient   Imported By: Sherian Rein 02/05/2011 12:33:14  _____________________________________________________________________  External Attachment:    Type:   Image     Comment:   External Document

## 2011-03-12 ENCOUNTER — Other Ambulatory Visit: Payer: Self-pay | Admitting: Endocrinology

## 2011-03-20 ENCOUNTER — Encounter: Payer: Self-pay | Admitting: Endocrinology

## 2011-04-09 ENCOUNTER — Encounter: Payer: Self-pay | Admitting: *Deleted

## 2011-05-14 ENCOUNTER — Other Ambulatory Visit: Payer: Self-pay | Admitting: Endocrinology

## 2011-05-14 NOTE — Telephone Encounter (Signed)
Rx faxed to [facility].  

## 2011-05-14 NOTE — Telephone Encounter (Signed)
Please advise 

## 2011-06-03 ENCOUNTER — Other Ambulatory Visit: Payer: Self-pay | Admitting: *Deleted

## 2011-06-03 MED ORDER — METOPROLOL TARTRATE 25 MG PO TABS: ORAL_TABLET | ORAL | Status: DC

## 2011-06-03 NOTE — Telephone Encounter (Signed)
R'cd fax from pt requesting refill of Metoprolol

## 2011-06-22 ENCOUNTER — Encounter: Payer: Self-pay | Admitting: *Deleted

## 2011-06-29 ENCOUNTER — Other Ambulatory Visit (INDEPENDENT_AMBULATORY_CARE_PROVIDER_SITE_OTHER): Payer: Medicare Other

## 2011-06-29 ENCOUNTER — Other Ambulatory Visit: Payer: Self-pay | Admitting: Endocrinology

## 2011-06-29 ENCOUNTER — Ambulatory Visit (INDEPENDENT_AMBULATORY_CARE_PROVIDER_SITE_OTHER): Payer: Medicare Other | Admitting: Endocrinology

## 2011-06-29 ENCOUNTER — Encounter: Payer: Self-pay | Admitting: Endocrinology

## 2011-06-29 VITALS — BP 135/80 | HR 78 | Temp 97.3°F | Ht 63.0 in | Wt 128.2 lb

## 2011-06-29 DIAGNOSIS — R259 Unspecified abnormal involuntary movements: Secondary | ICD-10-CM

## 2011-06-29 DIAGNOSIS — E785 Hyperlipidemia, unspecified: Secondary | ICD-10-CM

## 2011-06-29 DIAGNOSIS — I10 Essential (primary) hypertension: Secondary | ICD-10-CM

## 2011-06-29 DIAGNOSIS — Z79899 Other long term (current) drug therapy: Secondary | ICD-10-CM

## 2011-06-29 DIAGNOSIS — E039 Hypothyroidism, unspecified: Secondary | ICD-10-CM

## 2011-06-29 DIAGNOSIS — R9431 Abnormal electrocardiogram [ECG] [EKG]: Secondary | ICD-10-CM

## 2011-06-29 LAB — TSH: TSH: 1.63 u[IU]/mL (ref 0.35–5.50)

## 2011-06-29 LAB — CBC WITH DIFFERENTIAL/PLATELET
Basophils Absolute: 0 10*3/uL (ref 0.0–0.1)
Basophils Relative: 0.5 % (ref 0.0–3.0)
Eosinophils Absolute: 0.1 10*3/uL (ref 0.0–0.7)
HCT: 39.4 % (ref 36.0–46.0)
Hemoglobin: 13.3 g/dL (ref 12.0–15.0)
Lymphs Abs: 1.7 10*3/uL (ref 0.7–4.0)
MCHC: 33.8 g/dL (ref 30.0–36.0)
MCV: 95.2 fl (ref 78.0–100.0)
Monocytes Absolute: 0.6 10*3/uL (ref 0.1–1.0)
Neutro Abs: 3.5 10*3/uL (ref 1.4–7.7)
RBC: 4.14 Mil/uL (ref 3.87–5.11)
RDW: 12.8 % (ref 11.5–14.6)

## 2011-06-29 LAB — BASIC METABOLIC PANEL
CO2: 27 mEq/L (ref 19–32)
Calcium: 8.7 mg/dL (ref 8.4–10.5)
Chloride: 97 mEq/L (ref 96–112)
Creatinine, Ser: 0.9 mg/dL (ref 0.4–1.2)
Sodium: 134 mEq/L — ABNORMAL LOW (ref 135–145)

## 2011-06-29 LAB — URINALYSIS, ROUTINE W REFLEX MICROSCOPIC
Bilirubin Urine: NEGATIVE
Ketones, ur: NEGATIVE
Specific Gravity, Urine: <=1.005 (ref 1.000–1.030)
Urine Glucose: NEGATIVE
Urobilinogen, UA: 0.2 (ref 0.0–1.0)

## 2011-06-29 LAB — LIPID PANEL
Cholesterol: 290 mg/dL — ABNORMAL HIGH (ref 0–200)
Total CHOL/HDL Ratio: 6
Triglycerides: 574 mg/dL — ABNORMAL HIGH (ref 0.0–149.0)
VLDL: 114.8 mg/dL — ABNORMAL HIGH (ref 0.0–40.0)

## 2011-06-29 LAB — HEPATIC FUNCTION PANEL
ALT: 17 U/L (ref 0–35)
AST: 21 U/L (ref 0–37)
Albumin: 4.1 g/dL (ref 3.5–5.2)
Alkaline Phosphatase: 82 U/L (ref 39–117)

## 2011-06-29 NOTE — Patient Instructions (Addendum)
Stop levothyroxine, on a trial basis. Refer back to neurologist dr Terrace Arabia.  you will receive a phone call, with a day and time for an appointment.   Let's check an "echo" (easy and painless heart test).   Also, please do blood tests today.   (see letter)

## 2011-06-29 NOTE — Progress Notes (Signed)
Subjective:    Patient ID: Laura [name], female    DOB: Aug 16, 1941, 70 y.o.   MRN: [id number]  HPI Pt states few mos of moderate non-vertigenous dizziness sensation in the head, and assoc tremulousness.  She says the sxs are caused by synthroid, and she wants to go off it.   Past Medical History  Diagnosis Date  . HYPERLIPIDEMIA 06/22/2007    rx refused  . HYPOTHYROIDISM 04/15/2009  . Anxiety state, unspecified 10/10/2007  . RESTLESS LEG SYNDROME 10/01/2008  . HYPERTENSION 05/07/2008  . 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 TRIGGER FINGER 10/30/2009  . TREMOR 11/21/2007  . Headache 08/27/2008  . TACHYCARDIA 08/27/2008  . ASYMPTOMATIC POSTMENOPAUSAL STATUS 04/15/2009  . Deaf   . Mild mental retardation   . Dermatitis     No past surgical history on file.  History   Social History  . Marital Status: Single    Spouse Name: N/A    Number of Children: N/A  . Years of Education: N/A   Occupational History  . Disabled    Social History Main Topics  . Smoking status: Never Smoker   . Smokeless tobacco: Not on file  . Alcohol Use: No  . Drug Use: No  . Sexually Active:    Other Topics Concern  . Not on file   Social History Narrative   Lives aloneNever marriedPhysical activity is very goodShe has never had a driver's licenseDoes not have a phone at home     Current Outpatient Prescriptions on File Prior to Visit  Medication Sig Dispense Refill  . levothyroxine (SYNTHROID, LEVOTHROID) 50 MCG tablet TAKE ONE TABLET BY MOUTH EVERY DAY  30 tablet  5  . metoprolol tartrate (LOPRESSOR) 25 MG tablet Take 1/2 tablet by mouth two times a day  30 tablet  5  . Multiple Vitamin (MULTIVITAMIN) tablet Take 1 tablet by mouth daily.        . temazepam (RESTORIL) 15 MG capsule TAKE ONE CAPSULE BY MOUTH THREE TIMES DAILY AS NEEDED FOR TREMOR  90 capsule  5  . vitamin E 100 UNIT capsule Take 100 Units by mouth 2 (two) times daily.          No Known Allergies  Family History  Problem  Relation Age of Onset  . Thyroid disease Neg Hx    BP 142/84  Pulse 78  Temp(Src) 97.3 F (36.3 C) (Oral)  Ht 5\' 3"  (1.6 m)  Wt 128 lb 3.2 oz (58.151 kg)  BMI 22.71 kg/m2  SpO2 97%  Review of Systems She has headache, but no loc.    Objective:   Physical Exam GENERAL: no distress LUNGS:  Clear to auscultation HEART: Regular rate and rhythm without murmurs noted. Normal S1,S2.   head: no deformity eyes: no periorbital swelling, no proptosis external nose and ears are normal mouth: no lesion seen Both eac's and tm's are normal    Lab Results  Component Value Date   WBC 5.9 06/29/2011   HGB 13.3 06/29/2011   HCT 39.4 06/29/2011   PLT 257.0 06/29/2011   CHOL 290* 06/29/2011   TRIG 574.0 Triglyceride is over 400; calculations on Lipids are invalid.* 06/29/2011   HDL 51.80 06/29/2011   LDLDIRECT 126.1 06/29/2011   ALT 17 06/29/2011   AST 21 06/29/2011   NA 134* 06/29/2011   K 4.3 06/29/2011   CL 97 06/29/2011   CREATININE 0.9 06/29/2011   BUN 17 06/29/2011   CO2 27 06/29/2011   TSH 1.63 06/29/2011  Assessment & Plan:  Dizziness, new.  uncertain etiology Hypothyroidism, well-replaced, but she needs synthroid Hypertriglyceridemia. This tg level is high, even non-fasting Htn, well-controlled

## 2011-06-30 LAB — LDL CHOLESTEROL, DIRECT: Direct LDL: 126.1 mg/dL

## 2011-07-02 MED ORDER — LEVOTHYROXINE SODIUM 50 MCG PO TABS: 50.0000 ug | ORAL_TABLET | Freq: Every day | ORAL | Status: DC

## 2011-07-13 ENCOUNTER — Ambulatory Visit: Payer: Medicare Other | Admitting: Endocrinology

## 2011-07-13 ENCOUNTER — Ambulatory Visit (HOSPITAL_COMMUNITY): Payer: Medicare Other | Attending: Endocrinology | Admitting: Radiology

## 2011-07-13 DIAGNOSIS — I079 Rheumatic tricuspid valve disease, unspecified: Secondary | ICD-10-CM | POA: Insufficient documentation

## 2011-07-13 DIAGNOSIS — I059 Rheumatic mitral valve disease, unspecified: Secondary | ICD-10-CM | POA: Insufficient documentation

## 2011-07-13 DIAGNOSIS — I10 Essential (primary) hypertension: Secondary | ICD-10-CM | POA: Insufficient documentation

## 2011-07-13 DIAGNOSIS — E785 Hyperlipidemia, unspecified: Secondary | ICD-10-CM | POA: Insufficient documentation

## 2011-07-13 DIAGNOSIS — R9431 Abnormal electrocardiogram [ECG] [EKG]: Secondary | ICD-10-CM | POA: Insufficient documentation

## 2011-07-13 DIAGNOSIS — Z029 Encounter for administrative examinations, unspecified: Secondary | ICD-10-CM

## 2011-07-13 DIAGNOSIS — I379 Nonrheumatic pulmonary valve disorder, unspecified: Secondary | ICD-10-CM | POA: Insufficient documentation

## 2011-07-13 DIAGNOSIS — I517 Cardiomegaly: Secondary | ICD-10-CM

## 2011-07-15 DIAGNOSIS — I517 Cardiomegaly: Secondary | ICD-10-CM | POA: Insufficient documentation

## 2011-08-20 ENCOUNTER — Other Ambulatory Visit: Payer: Self-pay | Admitting: Neurology

## 2011-08-20 DIAGNOSIS — E785 Hyperlipidemia, unspecified: Secondary | ICD-10-CM

## 2011-08-20 DIAGNOSIS — R200 Anesthesia of skin: Secondary | ICD-10-CM

## 2011-09-14 ENCOUNTER — Ambulatory Visit
Admission: RE | Admit: 2011-09-14 | Discharge: 2011-09-14 | Disposition: A | Discharge status: Home / Self Care | Payer: PRIVATE HEALTH INSURANCE | Source: Ambulatory Visit | Attending: Neurology | Admitting: Neurology

## 2011-09-14 DIAGNOSIS — E785 Hyperlipidemia, unspecified: Secondary | ICD-10-CM

## 2011-09-14 DIAGNOSIS — R200 Anesthesia of skin: Secondary | ICD-10-CM

## 2011-11-04 ENCOUNTER — Telehealth: Payer: Self-pay | Admitting: Endocrinology

## 2011-11-04 NOTE — Telephone Encounter (Signed)
The pt faxed over a note and is requesting a refill of Temazepam.  Thanks!   Fax for [facility]  [phone] Annabelle Harman Cox)

## 2011-11-05 ENCOUNTER — Other Ambulatory Visit: Payer: Self-pay | Admitting: Endocrinology

## 2011-11-05 NOTE — Telephone Encounter (Signed)
Please advise 

## 2011-11-05 NOTE — Telephone Encounter (Signed)
She gets this from [employer]

## 2011-11-05 NOTE — Telephone Encounter (Signed)
Pt informed of MD's advisement via fax.

## 2011-11-06 ENCOUNTER — Other Ambulatory Visit: Payer: Self-pay | Admitting: Endocrinology

## 2011-11-26 ENCOUNTER — Other Ambulatory Visit: Payer: Self-pay | Admitting: Endocrinology

## 2011-11-30 ENCOUNTER — Telehealth: Payer: Self-pay | Admitting: Endocrinology

## 2011-11-30 NOTE — Telephone Encounter (Signed)
[name], interpreter for Laura [name], called to say Laura [name] needs her Temazepam.  Per [name], Dr. Terrace Arabia is sending her back to Dr. [name] for refills.  Her next follow up is Feb. 25.

## 2011-12-02 ENCOUNTER — Other Ambulatory Visit: Payer: Self-pay | Admitting: Endocrinology

## 2011-12-02 MED ORDER — TEMAZEPAM 15 MG PO CAPS: 15.0000 mg | ORAL_CAPSULE | Freq: Three times a day (TID) | ORAL | Status: DC | PRN

## 2011-12-03 ENCOUNTER — Other Ambulatory Visit: Payer: Self-pay | Admitting: Endocrinology

## 2011-12-03 NOTE — Telephone Encounter (Signed)
Rx faxed to Temazepam 15 mg faxed to [facility] pharmacy. Pt informed via fax.

## 2011-12-15 DIAGNOSIS — B351 Tinea unguium: Secondary | ICD-10-CM | POA: Diagnosis not present

## 2011-12-15 DIAGNOSIS — M79609 Pain in unspecified limb: Secondary | ICD-10-CM | POA: Diagnosis not present

## 2012-01-18 ENCOUNTER — Ambulatory Visit (INDEPENDENT_AMBULATORY_CARE_PROVIDER_SITE_OTHER): Payer: Medicare Other | Admitting: Endocrinology

## 2012-01-18 ENCOUNTER — Encounter: Payer: Self-pay | Admitting: Endocrinology

## 2012-01-18 VITALS — BP 130/78 | HR 81 | Temp 97.9°F | Ht 63.0 in | Wt 122.0 lb

## 2012-01-18 DIAGNOSIS — R259 Unspecified abnormal involuntary movements: Secondary | ICD-10-CM

## 2012-01-18 DIAGNOSIS — Z0000 Encounter for general adult medical examination without abnormal findings: Secondary | ICD-10-CM | POA: Diagnosis not present

## 2012-01-18 MED ORDER — CLONAZEPAM 0.5 MG PO TABS: 0.5000 mg | ORAL_TABLET | Freq: Two times a day (BID) | ORAL | Status: DC | PRN

## 2012-01-18 NOTE — Progress Notes (Signed)
Subjective:    Patient ID: Laura [name], female    DOB: 1941/07/16, 71 y.o.   MRN: [id number]  HPI Pt says she no longer sees neurol.  She says tremor persists.   Past Medical History  Diagnosis Date  . HYPERLIPIDEMIA 06/22/2007    rx refused  . HYPOTHYROIDISM 04/15/2009  . Anxiety state, unspecified 10/10/2007  . RESTLESS LEG SYNDROME 10/01/2008  . HYPERTENSION 05/07/2008  . 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 TRIGGER FINGER 10/30/2009  . TREMOR 11/21/2007  . Headache 08/27/2008  . TACHYCARDIA 08/27/2008  . ASYMPTOMATIC POSTMENOPAUSAL STATUS 04/15/2009  . Deaf   . Mild mental retardation   . Dermatitis     No past surgical history on file.  History   Social History  . Marital Status: Single    Spouse Name: N/A    Number of Children: N/A  . Years of Education: N/A   Occupational History  . Disabled    Social History Main Topics  . Smoking status: Never Smoker   . Smokeless tobacco: Not on file  . Alcohol Use: No  . Drug Use: No  . Sexually Active:    Other Topics Concern  . Not on file   Social History Narrative   Lives aloneNever marriedPhysical activity is very goodShe has never had a driver's licenseDoes not have a phone at home     Current Outpatient Prescriptions on File Prior to Visit  Medication Sig Dispense Refill  . levothyroxine (LEVOTHROID) 50 MCG tablet Take 1 tablet (50 mcg total) by mouth daily.  30 tablet  11  . metoprolol tartrate (LOPRESSOR) 25 MG tablet TAKE ONE-HALF TABLET BY MOUTH TWICE DAILY  30 tablet  5  . Multiple Vitamin (MULTIVITAMIN) tablet Take 1 tablet by mouth daily.        . vitamin E 100 UNIT capsule Take 100 Units by mouth 2 (two) times daily.          No Known Allergies  Family History  Problem Relation Age of Onset  . Thyroid disease Neg Hx     BP 130/78  Pulse 81  Temp(Src) 97.9 F (36.6 C) (Oral)  Ht 5\' 3"  (1.6 m)  Wt 122 lb (55.339 kg)  BMI 21.61 kg/m2  SpO2 95%    Review of Systems Pt reports  anxiety.    Objective:   Physical Exam VITAL SIGNS:  See vs page GENERAL: no distress Pulses: dorsalis pedis intact bilat.   Feet: no deformity.  no ulcer on the feet.  feet are of normal color and temp.  no edema Neuro: sensation is intact to touch on the feet.  No tremor   Lab Results  Component Value Date   WBC 5.9 06/29/2011   HGB 13.3 06/29/2011   HCT 39.4 06/29/2011   PLT 257.0 06/29/2011   GLUCOSE 101* 06/29/2011   CHOL 290* 06/29/2011   TRIG 574.0 Triglyceride is over 400; calculations on Lipids are invalid.* 06/29/2011   HDL 51.80 06/29/2011   LDLDIRECT 126.1 06/29/2011   ALT 17 06/29/2011   AST 21 06/29/2011   NA 134* 06/29/2011   K 4.3 06/29/2011   CL 97 06/29/2011   CREATININE 0.9 06/29/2011   BUN 17 06/29/2011   CO2 27 06/29/2011   TSH 1.63 06/29/2011      Assessment & Plan:  Tremor, persistent   Subjective:   Patient here for Medicare annual wellness visit and management of other chronic and acute problems.     Risk factors: advanced age  Roster of Physicians Providing Medical Care to Patient: Neurol: yan    Activities of Daily Living: In your present state of health, do you have any difficulty performing the following activities?:  Preparing food and eating?: No  Bathing yourself: No  Getting dressed: No  Using the toilet: No  Moving around from place to place: No  In the past year have you fallen or had a near fall?: No    Home Safety: Has smoke detector and wears seat belts. No firearms. No excess sun exposure.  Diet and Exercise  Current exercise habits:  Pt reports good activity level. Dietary issues discussed: pt reports a healthy diet   Depression Screen  Q1: Over the past two weeks, have you felt down, depressed or hopeless? no  Q2: Over the past two weeks, have you felt little interest or pleasure in doing things? no   The following portions of the patient's history were reviewed and updated as appropriate: allergies, current medications, past family history,  past medical history, past social history, past surgical history and problem list.  Past Medical History  Diagnosis Date  . HYPERLIPIDEMIA 06/22/2007    rx refused  . HYPOTHYROIDISM 04/15/2009  . Anxiety state, unspecified 10/10/2007  . RESTLESS LEG SYNDROME 10/01/2008  . HYPERTENSION 05/07/2008  . 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 TRIGGER FINGER 10/30/2009  . TREMOR 11/21/2007  . Headache 08/27/2008  . TACHYCARDIA 08/27/2008  . ASYMPTOMATIC POSTMENOPAUSAL STATUS 04/15/2009  . Deaf   . Mild mental retardation   . Dermatitis     No past surgical history on file.  History   Social History  . Marital Status: Single    Spouse Name: N/A    Number of Children: N/A  . Years of Education: N/A   Occupational History  . Disabled    Social History Main Topics  . Smoking status: Never Smoker   . Smokeless tobacco: Not on file  . Alcohol Use: No  . Drug Use: No  . Sexually Active:    Other Topics Concern  . Not on file   Social History Narrative   Lives aloneNever marriedPhysical activity is very goodShe has never had a driver's licenseDoes not have a phone at home     Current Outpatient Prescriptions on File Prior to Visit  Medication Sig Dispense Refill  . levothyroxine (LEVOTHROID) 50 MCG tablet Take 1 tablet (50 mcg total) by mouth daily.  30 tablet  11  . metoprolol tartrate (LOPRESSOR) 25 MG tablet TAKE ONE-HALF TABLET BY MOUTH TWICE DAILY  30 tablet  5  . Multiple Vitamin (MULTIVITAMIN) tablet Take 1 tablet by mouth daily.        . temazepam (RESTORIL) 15 MG capsule Take 1 capsule (15 mg total) by mouth 3 (three) times daily as needed (tremor).  90 capsule  5  . vitamin E 100 UNIT capsule Take 100 Units by mouth 2 (two) times daily.          No Known Allergies  Family History  Problem Relation Age of Onset  . Thyroid disease Neg Hx     BP 130/78  Pulse 81  Temp(Src) 97.9 F (36.6 C) (Oral)  Ht 5\' 3"  (1.6 m)  Wt 122 lb (55.339 kg)  BMI 21.61 kg/m2  SpO2  95%   Review of Systems  She has chronic hearing loss, but no visual loss Objective:   Vision:  Sees opthalmologist Hearing: none--interview via sign-languge interpreter Body mass index:  See vs page Msk: pt  easily and quickly performs "get-up-and-go" from a sitting position Cognitive Impairment Assessment: cognition, memory and judgment appear normal.  remembers 3/3 at 5 minutes.  excellent recall.  can easily read and write a sentence.  alert and oriented x 3   Assessment:   Medicare wellness utd on preventive parameters    Plan:   During the course of the visit the patient was educated and counseled about appropriate screening and preventive services including:        Fall prevention   Screening mammography  Bone densitometry screening  Diabetes screening  Nutrition counseling   Vaccines / LABS Zostavax / Pnemonccoal Vaccine  today  PSA  Patient Instructions (the written plan) was given to the patient.

## 2012-01-18 NOTE — Patient Instructions (Addendum)
please consider these measures for your health:  minimize alcohol.  do not use tobacco products.  have a colonoscopy at least every 10 years from age 69.  Women should have an annual mammogram from age 51.  keep firearms safely stored.  always use seat belts.  have working smoke alarms in your home.  see an eye doctor and dentist regularly.  never drive under the influence of alcohol or drugs (including prescription drugs).  those with fair skin should take precautions against the sun. please let me know what your wishes would be, if artificial life support measures should become necessary.  it is critically important to prevent falling down (keep floor areas well-lit, dry, and free of loose objects.  If you have a cane, walker, or wheelchair, you should use it, even for short trips around the house.  Also, try not to rush). Please return in 1 year. Please continue the same medications for your shakiness.  i don't think there are any other medications that would be better. Change temazepam to clonazepam  Here is a prescription.   Call if you decide to do the mammogram.  This is very important to get, as it reduces your changes of getting cancer.

## 2012-03-22 DIAGNOSIS — M79609 Pain in unspecified limb: Secondary | ICD-10-CM | POA: Diagnosis not present

## 2012-03-22 DIAGNOSIS — B351 Tinea unguium: Secondary | ICD-10-CM | POA: Diagnosis not present

## 2012-06-02 ENCOUNTER — Other Ambulatory Visit: Payer: Self-pay | Admitting: Endocrinology

## 2012-06-02 ENCOUNTER — Encounter: Payer: Self-pay | Admitting: *Deleted

## 2012-06-28 DIAGNOSIS — B351 Tinea unguium: Secondary | ICD-10-CM | POA: Diagnosis not present

## 2012-06-28 DIAGNOSIS — M79609 Pain in unspecified limb: Secondary | ICD-10-CM | POA: Diagnosis not present

## 2012-07-20 ENCOUNTER — Telehealth: Payer: Self-pay | Admitting: *Deleted

## 2012-07-20 ENCOUNTER — Other Ambulatory Visit: Payer: Self-pay | Admitting: Endocrinology

## 2012-07-20 MED ORDER — CLONAZEPAM 0.5 MG PO TABS: 0.5000 mg | ORAL_TABLET | Freq: Two times a day (BID) | ORAL | Status: DC | PRN

## 2012-07-20 NOTE — Telephone Encounter (Signed)
Pt sent fax requesting refill of Clonazepam-last written 01/18/2012 #60 with 5 refills-please advise.

## 2012-07-20 NOTE — Telephone Encounter (Signed)
i printed 

## 2012-07-20 NOTE — Telephone Encounter (Signed)
Rx faxed to pharmacy, pt advised of same via return fax message.

## 2012-07-21 ENCOUNTER — Other Ambulatory Visit: Payer: Self-pay | Admitting: Endocrinology

## 2012-08-31 DIAGNOSIS — Z23 Encounter for immunization: Secondary | ICD-10-CM | POA: Diagnosis not present

## 2012-09-27 DIAGNOSIS — M79609 Pain in unspecified limb: Secondary | ICD-10-CM | POA: Diagnosis not present

## 2012-09-27 DIAGNOSIS — B351 Tinea unguium: Secondary | ICD-10-CM | POA: Diagnosis not present

## 2012-11-24 ENCOUNTER — Other Ambulatory Visit: Payer: Self-pay | Admitting: Endocrinology

## 2012-11-29 ENCOUNTER — Other Ambulatory Visit: Payer: Self-pay

## 2012-11-29 ENCOUNTER — Telehealth: Payer: Self-pay | Admitting: Endocrinology

## 2012-11-29 MED ORDER — METOPROLOL TARTRATE 25 MG PO TABS: 25.0000 mg | ORAL_TABLET | Freq: Two times a day (BID) | ORAL | Status: DC

## 2012-11-29 NOTE — Telephone Encounter (Signed)
[facility] pharmacy called to get dosage clarification on refill of Metoprolol that was e-rx'd for the patient today.  The pharmacy phone number is [phone].

## 2012-11-29 NOTE — Telephone Encounter (Signed)
Spoke with pharmacist they received duplicate rx and this has been taken care of.

## 2012-12-27 ENCOUNTER — Telehealth: Payer: Self-pay | Admitting: *Deleted

## 2012-12-27 DIAGNOSIS — M79609 Pain in unspecified limb: Secondary | ICD-10-CM | POA: Diagnosis not present

## 2012-12-27 DIAGNOSIS — B351 Tinea unguium: Secondary | ICD-10-CM | POA: Diagnosis not present

## 2012-12-27 NOTE — Telephone Encounter (Signed)
Patient information of communication is via fax. Requested appointment date and time be changed with Dr. [name] and to make sure Interpreter for the deaf is available at her next visit with Dr. [name].  Tried to return fax information of new date and time and interpreter will assist her at visit on 01/16/2013 @1 :15pm. Fax # busy.

## 2013-01-16 ENCOUNTER — Ambulatory Visit (INDEPENDENT_AMBULATORY_CARE_PROVIDER_SITE_OTHER): Payer: Medicare Other | Admitting: Endocrinology

## 2013-01-16 VITALS — BP 122/80 | HR 85 | Wt 118.0 lb

## 2013-01-16 DIAGNOSIS — N951 Menopausal and female climacteric states: Secondary | ICD-10-CM

## 2013-01-16 DIAGNOSIS — E785 Hyperlipidemia, unspecified: Secondary | ICD-10-CM

## 2013-01-16 DIAGNOSIS — Z0000 Encounter for general adult medical examination without abnormal findings: Secondary | ICD-10-CM | POA: Diagnosis not present

## 2013-01-16 DIAGNOSIS — G2581 Restless legs syndrome: Secondary | ICD-10-CM

## 2013-01-16 DIAGNOSIS — R259 Unspecified abnormal involuntary movements: Secondary | ICD-10-CM

## 2013-01-16 DIAGNOSIS — I10 Essential (primary) hypertension: Secondary | ICD-10-CM

## 2013-01-16 DIAGNOSIS — Z79899 Other long term (current) drug therapy: Secondary | ICD-10-CM

## 2013-01-16 DIAGNOSIS — E039 Hypothyroidism, unspecified: Secondary | ICD-10-CM

## 2013-01-16 NOTE — Patient Instructions (Addendum)
blood tests are being requested for you today.  We'll contact you with results. good diet and exercise habits significanly improve your health.  please let me know if you wish to be referred to a dietician.  please consider these measures for your health:  minimize alcohol.  do not use tobacco products.  have a colonoscopy at least every 10 years from age 70.  Women should have an annual mammogram from age 75.  keep firearms safely stored.  always use seat belts.  have working smoke alarms in your home.  see an eye doctor and dentist regularly.  never drive under the influence of alcohol or drugs (including prescription drugs).  those with fair skin should take precautions against the sun. Refer to a neurology specialist.  you will receive a phone call, about a day and time for an appointment

## 2013-01-16 NOTE — Progress Notes (Signed)
Subjective:    Patient ID: Laura [name], female    DOB: 1941/05/15, 72 y.o.   MRN: [id number]  HPI The state of at least three ongoing medical problems is addressed today, with interval history of each noted here: RLS: has not improved. Denies weight change HTN: denies sob Past Medical History  Diagnosis Date  . HYPERLIPIDEMIA 06/22/2007    rx refused  . HYPOTHYROIDISM 04/15/2009  . Anxiety state, unspecified 10/10/2007  . RESTLESS LEG SYNDROME 10/01/2008  . HYPERTENSION 05/07/2008  . 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 TRIGGER FINGER 10/30/2009  . TREMOR 11/21/2007  . Headache 08/27/2008  . TACHYCARDIA 08/27/2008  . ASYMPTOMATIC POSTMENOPAUSAL STATUS 04/15/2009  . Deaf   . Mild mental retardation   . Dermatitis    No past surgical history on file.  History   Social History  . Marital Status: Single    Spouse Name: N/A    Number of Children: N/A  . Years of Education: N/A   Occupational History  . Disabled    Social History Main Topics  . Smoking status: Never Smoker   . Smokeless tobacco: Not on file  . Alcohol Use: No  . Drug Use: No  . Sexually Active:    Other Topics Concern  . Not on file   Social History Narrative   Lives alone   Never married   Physical activity is very good   She has never had a driver's license   Does not have a phone at home     Current Outpatient Prescriptions on File Prior to Visit  Medication Sig Dispense Refill  . clonazePAM (KLONOPIN) 0.5 MG tablet Take 1 tablet (0.5 mg total) by mouth 2 (two) times daily as needed. As needed for tremor  60 tablet  5  . levothyroxine (SYNTHROID, LEVOTHROID) 50 MCG tablet TAKE ONE TABLET BY MOUTH EVERY DAY  30 tablet  6  . metoprolol tartrate (LOPRESSOR) 25 MG tablet Take 1 tablet (25 mg total) by mouth 2 (two) times daily. TAKE ONE-HALF TABLET BY MOUTH TWICE DAILY  30 tablet  4  . Multiple Vitamin (MULTIVITAMIN) tablet Take 1 tablet by mouth daily.        . vitamin E 100 UNIT capsule Take  100 Units by mouth 2 (two) times daily.         No current facility-administered medications on file prior to visit.    No Known Allergies  Family History  Problem Relation Age of Onset  . Thyroid disease Neg Hx     BP 122/80  Pulse 85  Wt 118 lb (53.524 kg)  BMI 20.91 kg/m2  SpO2 97%  Review of Systems Denies chest pain and depression.      Objective:   Physical Exam VITAL SIGNS:  See vs page GENERAL: no distress Gait: normal and steady.   LUNGS:  Clear to auscultation HEART:  Regular rate and rhythm without murmurs noted. Normal S1,S2.       Assessment & Plan:  RLS, persistent, needs increased rx Hypothyroidism, on rx HTN, well-controlled   Subjective:   Patient here for Medicare annual wellness visit and management of other chronic and acute problems.     Risk factors: advanced age    Roster of Physicians Providing Medical Care to Patient:  See "snapshot"   Activities of Daily Living: In your present state of health, do you have any difficulty performing the following activities?:  Preparing food and eating?: No  Bathing yourself: No  Getting dressed:  No  Using the toilet:No  Moving around from place to place: No  In the past year have you fallen or had a near fall?:No    Home Safety: Has smoke detector and wears seat belts. No firearms. No excess sun exposure.  Diet and Exercise  Current exercise habits: pt says good Dietary issues discussed: pt reports a healthy diet   Depression Screen  Q1: Over the past two weeks, have you felt down, depressed or hopeless? no  Q2: Over the past two weeks, have you felt little interest or pleasure in doing things? no   The following portions of the patient's history were reviewed and updated as appropriate: allergies, current medications, past family history, past medical history, past social history, past surgical history and problem list.  Past Medical History  Diagnosis Date  . HYPERLIPIDEMIA 06/22/2007    rx  refused  . HYPOTHYROIDISM 04/15/2009  . Anxiety state, unspecified 10/10/2007  . RESTLESS LEG SYNDROME 10/01/2008  . HYPERTENSION 05/07/2008  . 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 TRIGGER FINGER 10/30/2009  . TREMOR 11/21/2007  . Headache 08/27/2008  . TACHYCARDIA 08/27/2008  . ASYMPTOMATIC POSTMENOPAUSAL STATUS 04/15/2009  . Deaf   . Mild mental retardation   . Dermatitis     No past surgical history on file.  History   Social History  . Marital Status: Single    Spouse Name: N/A    Number of Children: N/A  . Years of Education: N/A   Occupational History  . Disabled    Social History Main Topics  . Smoking status: Never Smoker   . Smokeless tobacco: Not on file  . Alcohol Use: No  . Drug Use: No  . Sexually Active:    Other Topics Concern  . Not on file   Social History Narrative   Lives alone   Never married   Physical activity is very good   She has never had a driver's license   Does not have a phone at home     Current Outpatient Prescriptions on File Prior to Visit  Medication Sig Dispense Refill  . clonazePAM (KLONOPIN) 0.5 MG tablet Take 1 tablet (0.5 mg total) by mouth 2 (two) times daily as needed. As needed for tremor  60 tablet  5  . levothyroxine (SYNTHROID, LEVOTHROID) 50 MCG tablet TAKE ONE TABLET BY MOUTH EVERY DAY  30 tablet  6  . metoprolol tartrate (LOPRESSOR) 25 MG tablet Take 1 tablet (25 mg total) by mouth 2 (two) times daily. TAKE ONE-HALF TABLET BY MOUTH TWICE DAILY  30 tablet  4  . Multiple Vitamin (MULTIVITAMIN) tablet Take 1 tablet by mouth daily.        . vitamin E 100 UNIT capsule Take 100 Units by mouth 2 (two) times daily.         No current facility-administered medications on file prior to visit.    No Known Allergies  Family History  Problem Relation Age of Onset  . Thyroid disease Neg Hx     BP 122/80  Pulse 85  Wt 118 lb (53.524 kg)  BMI 20.91 kg/m2  SpO2 97%   Review of Systems  She has chronic hearing loss,  but no visual loss Objective:   Vision:  Sees opthalmologist Hearing: grossly normal Body mass index:  See vs page Msk: pt easily and quickly performs "get-up-and-go" from a sitting position Cognitive Impairment Assessment: cognition, memory and judgment appear normal.  remembers 2/3 at 5 minutes (she says she is  distracted by her RLS sxs).  excellent recall.  can easily read and write a sentence.  alert and oriented x 3.  Assessment:   Medicare wellness utd on preventive parameters.     Plan:   During the course of the visit the patient was educated and counseled about appropriate screening and preventive services including:        Fall prevention   Screening mammography is refused Bone densitometry screening Diabetes screening  Nutrition counseling   Vaccines are refused / LABS   Patient Instructions (the written plan) was given to the patient.

## 2013-01-19 ENCOUNTER — Encounter: Payer: Medicare Other | Admitting: Endocrinology

## 2013-01-19 ENCOUNTER — Telehealth: Payer: Self-pay | Admitting: *Deleted

## 2013-01-19 ENCOUNTER — Other Ambulatory Visit: Payer: Self-pay | Admitting: Endocrinology

## 2013-01-19 NOTE — Telephone Encounter (Signed)
ASHLEY CALLED FROM [facility] FOR REFILL ON KLONOPIN FOR PATIENT. ALSO NEEDS TO KNOW IF OK TO USE A NEW MANUFACTURE FOR PATIENT REFILL OF GENERIC SYNTHROID/LEVOTHYROXINE. STATES IT IS A LAW NOW PHARMACIES HAVE TO CALL AND GET APPROVAL FOR NEW MANUFACTURE WITH PRESCRIPTIONS. CB#[phone]

## 2013-01-20 NOTE — Telephone Encounter (Signed)
I will let Dr. [name] manage on Monday.

## 2013-01-20 NOTE — Telephone Encounter (Signed)
See note below, had sent to Dr. [name] but did not know if you could help with this answer. sue

## 2013-01-22 NOTE — Telephone Encounter (Signed)
ok 

## 2013-01-23 MED ORDER — LEVOTHYROXINE SODIUM 50 MCG PO TABS: ORAL_TABLET | ORAL | Status: DC

## 2013-01-23 MED ORDER — CLONAZEPAM 0.5 MG PO TABS: ORAL_TABLET | ORAL | Status: DC

## 2013-01-23 NOTE — Addendum Note (Signed)
Addended by: Elnora Morrison on: 01/23/2013 09:42 AM   Modules accepted: Orders

## 2013-01-23 NOTE — Telephone Encounter (Signed)
PATIENT NOTIFED OF MEDICATIONS SENT TO [facility]/.

## 2013-01-26 ENCOUNTER — Ambulatory Visit: Payer: Medicare Other | Admitting: Neurology

## 2013-02-01 ENCOUNTER — Encounter: Payer: Self-pay | Admitting: Endocrinology

## 2013-02-02 ENCOUNTER — Ambulatory Visit (INDEPENDENT_AMBULATORY_CARE_PROVIDER_SITE_OTHER): Payer: Medicare Other | Admitting: Neurology

## 2013-02-02 ENCOUNTER — Encounter: Payer: Self-pay | Admitting: Neurology

## 2013-02-02 ENCOUNTER — Other Ambulatory Visit: Payer: Self-pay | Admitting: Neurology

## 2013-02-02 VITALS — BP 130/80 | HR 88 | Temp 97.5°F | Resp 20 | Wt 117.0 lb

## 2013-02-02 DIAGNOSIS — R269 Unspecified abnormalities of gait and mobility: Secondary | ICD-10-CM | POA: Diagnosis not present

## 2013-02-02 DIAGNOSIS — G2581 Restless legs syndrome: Secondary | ICD-10-CM

## 2013-02-02 DIAGNOSIS — R5381 Other malaise: Secondary | ICD-10-CM

## 2013-02-02 LAB — CBC WITH DIFFERENTIAL/PLATELET
Eosinophils Absolute: 0.2 10*3/uL (ref 0.0–0.7)
MCHC: 33.8 g/dL (ref 30.0–36.0)
MCV: 95.4 fl (ref 78.0–100.0)
Monocytes Absolute: 0.6 10*3/uL (ref 0.1–1.0)
Neutrophils Relative %: 64.3 % (ref 43.0–77.0)
Platelets: 264 10*3/uL (ref 150.0–400.0)
RDW: 12.4 % (ref 11.5–14.6)

## 2013-02-02 MED ORDER — PRAMIPEXOLE DIHYDROCHLORIDE 0.125 MG PO TABS: 0.1250 mg | ORAL_TABLET | Freq: Two times a day (BID) | ORAL | Status: DC

## 2013-02-02 NOTE — Progress Notes (Signed)
Laura [name] was seen today in neurologic consultation at the request of Romero Belling, MD.  The consultation is for the evaluation of RLS and tremor.  She is accompanied by a Nurse, learning disability.  She c/o these sx's in the leg for about 12 years.  She describes a shakiness to the legs and describes a shakiness to the legs.  It occurs daily.  It never occurs while walking but always happens in the seated/lying position.  It is worse in the afternoon.  It seems better at night.  There is no sensation of burning or crawling.  She was placed on Klonopin and takes it bid but it does not work.  She knows of nothing that will initiate the sx.  She takes metoprolol for BP and not for this shaking sensation.  She nevers has the sensation in the arms.    PREVIOUS MEDICATIONS: klonopin no help  ALLERGIES:  No Known Allergies  CURRENT MEDICATIONS:  Current Outpatient Prescriptions on File Prior to Visit  Medication Sig Dispense Refill  . clonazePAM (KLONOPIN) 0.5 MG tablet TAKE ONE TABLET BY MOUTH TWICE DAILY AS NEEDED FOR TREMOR  60 tablet  0  . levothyroxine (SYNTHROID, LEVOTHROID) 50 MCG tablet TAKE ONE TABLET BY MOUTH EVERY DAY . PER DR. ELLISON IS OK TO USE BRAND OF SANDOZ.  30 tablet  6  . metoprolol tartrate (LOPRESSOR) 25 MG tablet Take 1 tablet (25 mg total) by mouth 2 (two) times daily. TAKE ONE-HALF TABLET BY MOUTH TWICE DAILY  30 tablet  4  . Multiple Vitamin (MULTIVITAMIN) tablet Take 1 tablet by mouth daily.        . vitamin E 100 UNIT capsule Take 100 Units by mouth 2 (two) times daily.         No current facility-administered medications on file prior to visit.    PAST MEDICAL HISTORY:   Past Medical History  Diagnosis Date  . HYPERLIPIDEMIA 06/22/2007    rx refused  . HYPOTHYROIDISM 04/15/2009  . Anxiety state, unspecified 10/10/2007  . RESTLESS LEG SYNDROME 10/01/2008  . HYPERTENSION 05/07/2008  . 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 TRIGGER FINGER 10/30/2009  . TREMOR 11/21/2007  .  Headache 08/27/2008  . TACHYCARDIA 08/27/2008  . ASYMPTOMATIC POSTMENOPAUSAL STATUS 04/15/2009  . Deaf   . Mild mental retardation   . Dermatitis     PAST SURGICAL HISTORY:   Past Surgical History  Procedure Laterality Date  . Cataract extraction, bilateral      SOCIAL HISTORY:   History   Social History  . Marital Status: Single    Spouse Name: N/A    Number of Children: N/A  . Years of Education: N/A   Occupational History  . Disabled    Social History Main Topics  . Smoking status: Never Smoker   . Smokeless tobacco: Never Used  . Alcohol Use: No  . Drug Use: No  . Sexually Active: Not on file   Other Topics Concern  . Not on file   Social History Narrative   Lives alone   Never married   Physical activity is very good   She has never had a driver's license   Does not have a phone at home     FAMILY HISTORY:   Family Status  Relation Status Death Age  . Mother Deceased     CVA  . Father Deceased     CVA, CAD    ROS:  Admits to some occasional dizziness.  Better if sits  down.  Some off balance.  A complete 10 system review of systems was obtained and was unremarkable apart from what is mentioned above.  PHYSICAL EXAMINATION:    VITALS:   Filed Vitals:   02/02/13 1345  BP: 130/80  Pulse: 88  Temp: 97.5 F (36.4 C)  Resp: 20  Weight: 117 lb (53.071 kg)    GEN:  Normal appears female in no acute distress.  Appears stated age. HEENT:  Normocephalic, atraumatic. The mucous membranes are moist. The superficial temporal arteries are without ropiness or tenderness. Cardiovascular: Regular rate and rhythm with 3/6 SEM Lungs: Clear to auscultation bilaterally. Neck/Heme: There are no carotid bruits noted bilaterally.  NEUROLOGICAL: Orientation:  The patient is alert and oriented x 3.  Fund of knowledge is appropriate.  Recent and remote memory intact.  Attention span and concentration normal.  Repeats and names without difficulty. Cranial nerves: There  is good facial symmetry. The pupils are equal round and reactive to light bilaterally. Fundoscopic exam reveals clear disc margins bilaterally. Extraocular muscles are intact and visual fields are full to confrontational testing. She speaks using sign language.  No intelligible verbal speech to assess fluency. Soft palate rises symmetrically and there is no tongue deviation. She is deaf and cannot hear. Tone: Tone is good throughout. Sensation: Sensation is intact to light touch and pinprick throughout (facial, trunk, extremities). Vibration is intact at the bilateral big toe. There is no extinction with double simultaneous stimulation. There is no sensory dermatomal level identified. Coordination:  The patient has no difficulty with RAM's or FNF bilaterally. Motor: Strength is 5/5 in the bilateral upper and lower extremities.  Shoulder shrug is equal and symmetric. There is no pronator drift.  There are no fasciculations noted. DTR's: Deep tendon reflexes are 2/4 at the bilateral biceps, triceps, brachioradialis, 2+ at the bilateral patella and 1/4 at the bilateral achilles.  Plantar responses are downgoing bilaterally. Gait and Station: The patient is able to ambulate without difficulty.    IMPRESSION/PLAN  1. Probable restless leg syndrome.  I saw no evidence of any tremor, even though she describes a "shakiness" in the legs.  There is no rigidity or spasticity.  -I am going to check CBC, TIBC and iron stores.  -We are going to slowly wean her off clonazepam as she states that it has never been helpful and can make her sleepy.  -We are going to start a low dose of Mirapex.  She will take pramipexole, 0.125 mg at night for a week and then, if tolerated, will increase to one tablet twice a day.  Risks, benefits, side effects and alternative therapies were discussed.  The opportunity to ask questions was given and they were answered to the best of my ability.  The patient expressed understanding and  willingness to follow the outlined treatment protocols.  -I plan on seeing her back in 8 weeks, sooner should new neurologic issues arise.

## 2013-02-02 NOTE — Patient Instructions (Addendum)
1.  Start mirapex (pramipexole) - 0.125 mg - 1 tablet at night for a week and then if you are doing well, increase to 1 tablet in the AM and one tablet in the PM 2.  Decrease klonopin to once daily for a week and then stop the klonopin (clonazepam) 3.  Labs today 4.  Follow up in 8 weeks

## 2013-02-04 LAB — FERRITIN: Ferritin: 62 ng/mL (ref 10–291)

## 2013-02-04 LAB — IRON AND TIBC: %SAT: 14 % — ABNORMAL LOW (ref 20–55)

## 2013-02-06 LAB — FOLATE: Folate: >20 ng/mL

## 2013-02-10 ENCOUNTER — Ambulatory Visit (HOSPITAL_COMMUNITY): Admission: RE | Admit: 2013-02-10 | Payer: Medicare Other | Source: Ambulatory Visit

## 2013-02-15 ENCOUNTER — Ambulatory Visit (HOSPITAL_COMMUNITY)
Admission: RE | Admit: 2013-02-15 | Discharge: 2013-02-15 | Disposition: A | Discharge status: Home / Self Care | Payer: Medicare Other | Source: Ambulatory Visit | Attending: Endocrinology | Admitting: Endocrinology

## 2013-02-15 DIAGNOSIS — Z780 Asymptomatic menopausal state: Secondary | ICD-10-CM | POA: Diagnosis not present

## 2013-02-15 DIAGNOSIS — Z13820 Encounter for screening for osteoporosis: Secondary | ICD-10-CM | POA: Diagnosis not present

## 2013-02-15 DIAGNOSIS — N951 Menopausal and female climacteric states: Secondary | ICD-10-CM

## 2013-02-15 DIAGNOSIS — M810 Age-related osteoporosis without current pathological fracture: Secondary | ICD-10-CM | POA: Diagnosis not present

## 2013-02-16 ENCOUNTER — Other Ambulatory Visit: Payer: Self-pay | Admitting: *Deleted

## 2013-02-16 MED ORDER — LEVOTHYROXINE SODIUM 50 MCG PO TABS: ORAL_TABLET | ORAL | Status: DC

## 2013-02-17 DIAGNOSIS — M19049 Primary osteoarthritis, unspecified hand: Secondary | ICD-10-CM | POA: Diagnosis not present

## 2013-02-23 ENCOUNTER — Encounter: Payer: Self-pay | Admitting: Neurology

## 2013-02-23 ENCOUNTER — Telehealth: Payer: Self-pay | Admitting: Neurology

## 2013-02-23 NOTE — Telephone Encounter (Signed)
Pt wrote me a letter that she d/c klonopin and is on mirpaex 0.125 mg bid.  Sounds like she did okay, but last 7 days felt shaky again.  Sleeping well.  Will increase dose to 2 po bid.  Will write letter and have Jan send to pt.  Jan, will you also take a copy of this letter from pt to Dr. [name] per pt request?  Thanks

## 2013-02-23 NOTE — Telephone Encounter (Signed)
Copy of the letter written by the patient given to Dr. [name] as the patient asked.

## 2013-02-23 NOTE — Telephone Encounter (Signed)
Printed the letter in [facility] written by Dr. Arbutus Leas to the patient and mailed to the patient at [facility] [address] in [place].

## 2013-03-23 ENCOUNTER — Other Ambulatory Visit: Payer: Self-pay | Admitting: Endocrinology

## 2013-03-24 ENCOUNTER — Telehealth: Payer: Self-pay

## 2013-03-24 NOTE — Telephone Encounter (Signed)
Multiple faxes from pt.  It appears that she is still having a lot of restless leg symptoms on the mirapex.  It may be that she was a little better while on the clonazepam, although it difficult to ascertain from her notes.  She has requested a refill of the clonazepam from Dr. [name].  She has a f/u appt on 5/13.

## 2013-03-26 NOTE — Telephone Encounter (Signed)
I have reviewed all of her faxed notes.  It is very difficult to figure out exactly what is helping and what is not, but overall I did think she was trying to tell me that the mirapex wasn't helping.  I am not sure that it would be good pt care to do more over the phone/fax since I think that something is getting lost in the translation.  Please tell her that we will address at her follow up visit in a week.

## 2013-03-27 NOTE — Telephone Encounter (Signed)
Tiffany please call [name] [phone], she has medical power of attorney for patient.

## 2013-03-28 NOTE — Telephone Encounter (Signed)
Spoke with [name] regarding the communication issues we are having.  [name] said she has issues with questions giving her choices for example, Did A help better or B?  She does better with short yes or no questions.  Also explained to her that we would discuss all this at pt's appt next week with an interpreter.

## 2013-04-04 ENCOUNTER — Ambulatory Visit (INDEPENDENT_AMBULATORY_CARE_PROVIDER_SITE_OTHER): Payer: Medicare Other | Admitting: Neurology

## 2013-04-04 ENCOUNTER — Encounter: Payer: Self-pay | Admitting: Neurology

## 2013-04-04 VITALS — BP 128/78 | HR 92 | Temp 98.2°F | Resp 20 | Wt 117.0 lb

## 2013-04-04 DIAGNOSIS — F411 Generalized anxiety disorder: Secondary | ICD-10-CM

## 2013-04-04 MED ORDER — CLONAZEPAM 0.5 MG PO TABS: 0.5000 mg | ORAL_TABLET | Freq: Two times a day (BID) | ORAL | Status: DC | PRN

## 2013-04-04 NOTE — Patient Instructions (Addendum)
1.  Decrease mirapex to one tablet twice per day for a week and then stop the mirapex 2.  Restart the klonopin - 0.5 mg - 1 tablet twice per day

## 2013-04-04 NOTE — Progress Notes (Signed)
Laura [name] was seen today in neurologic f/u for presumed RLS.  She is accompanied by a Nurse, learning disability.  She c/o these sx's in the leg for about 12 years.  The description of the symptoms is very different this time than last time.  Today, the patient states that she always has the symptoms, whether she is walking or sitting.  It does not interfere with her sleep going to sleep.  She describes it as an anxiety in her legs.  Although she states it is like a tremor, she admits that it is not visible.  Last visit, the patient told me that the clonazepam was not helping and I trying to wean her off of the medication while putting her on Mirapex.  She sent several faxes as a form of communication, but it was still very difficult to understand exactly what was helping and was not.  She is following up today in that regard.  Today, she states that the Mirapex was not helpful, and she realized that the clonazepam was helpful.  Prior to the clonazepam, she stated that she tried several anti-anxiety medications that were not as helpful as the clonazepam.  She cannot remember the names of those medications.  She does state that she has the symptoms every single day of the week, with the exception of Sunday.  When I asked her what is different about Sunday, she states that she is able to go to church and "calm down."  PREVIOUS MEDICATIONS: klonopin no help  ALLERGIES:  No Known Allergies  CURRENT MEDICATIONS:  Current Outpatient Prescriptions on File Prior to Visit  Medication Sig Dispense Refill  . clonazePAM (KLONOPIN) 0.5 MG tablet TAKE ONE TABLET BY MOUTH TWICE DAILY AS NEEDED FOR  TREMOR  60 tablet  0  . levothyroxine (SYNTHROID, LEVOTHROID) 50 MCG tablet TAKE ONE TABLET BY MOUTH EVERY DAY . PER DR. ELLISON IS OK TO USE BRAND OF SANDOZ.  30 tablet  6  . metoprolol tartrate (LOPRESSOR) 25 MG tablet Take 1 tablet (25 mg total) by mouth 2 (two) times daily. TAKE ONE-HALF TABLET BY MOUTH TWICE DAILY  30 tablet  4   . Multiple Vitamin (MULTIVITAMIN) tablet Take 1 tablet by mouth daily.        . pramipexole (MIRAPEX) 0.125 MG tablet Take 1 tablet (0.125 mg total) by mouth 2 (two) times daily.  60 tablet  3  . vitamin E 100 UNIT capsule Take 100 Units by mouth 2 (two) times daily.         No current facility-administered medications on file prior to visit.    PAST MEDICAL HISTORY:   Past Medical History  Diagnosis Date  . HYPERLIPIDEMIA 06/22/2007    rx refused  . HYPOTHYROIDISM 04/15/2009  . Anxiety state, unspecified 10/10/2007  . RESTLESS LEG SYNDROME 10/01/2008  . HYPERTENSION 05/07/2008  . 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 TRIGGER FINGER 10/30/2009  . TREMOR 11/21/2007  . Headache 08/27/2008  . TACHYCARDIA 08/27/2008  . ASYMPTOMATIC POSTMENOPAUSAL STATUS 04/15/2009  . Deaf   . Mild mental retardation   . Dermatitis     PAST SURGICAL HISTORY:   Past Surgical History  Procedure Laterality Date  . Cataract extraction, bilateral      SOCIAL HISTORY:   History   Social History  . Marital Status: Single    Spouse Name: N/A    Number of Children: N/A  . Years of Education: N/A   Occupational History  . Disabled    Social  History Main Topics  . Smoking status: Never Smoker   . Smokeless tobacco: Never Used  . Alcohol Use: No  . Drug Use: No  . Sexually Active: Not on file   Other Topics Concern  . Not on file   Social History Narrative   Lives alone   Never married   Physical activity is very good   She has never had a driver's license   Does not have a phone at home     FAMILY HISTORY:   Family Status  Relation Status Death Age  . Mother Deceased     CVA  . Father Deceased     CVA, CAD    ROS:  Admits to some occasional dizziness.  Better if sits down.  Some off balance.  A complete 10 system review of systems was obtained and was unremarkable apart from what is mentioned above.  PHYSICAL EXAMINATION:    VITALS:   There were no vitals filed for this  visit.  GEN:  Normal appears female in no acute distress.  Appears stated age. HEENT:  Normocephalic, atraumatic. The mucous membranes are moist. The superficial temporal arteries are without ropiness or tenderness. Cardiovascular: Regular rate and rhythm with 3/6 SEM Lungs: Clear to auscultation bilaterally. Neck/Heme: There are no carotid bruits noted bilaterally.  NEUROLOGICAL: Orientation:  The patient is alert and oriented x 3.  Fund of knowledge is appropriate.  Recent and remote memory intact.  Attention span and concentration normal.  Repeats and names without difficulty. Cranial nerves: There is good facial symmetry. The pupils are equal round and reactive to light bilaterally. Fundoscopic exam reveals clear disc margins bilaterally. Extraocular muscles are intact and visual fields are full to confrontational testing. She speaks using sign language.  No intelligible verbal speech to assess fluency. Soft palate rises symmetrically and there is no tongue deviation. She is deaf and cannot hear. Tone: Tone is good throughout. Sensation: Sensation is intact to light touch and pinprick throughout (facial, trunk, extremities). Vibration is intact at the bilateral big toe. There is no extinction with double simultaneous stimulation. There is no sensory dermatomal level identified. Coordination:  The patient has no difficulty with RAM's or FNF bilaterally. Motor: Strength is 5/5 in the bilateral upper and lower extremities.  Shoulder shrug is equal and symmetric. There is no pronator drift.  There are no fasciculations noted. DTR's: Deep tendon reflexes are 2/4 at the bilateral biceps, triceps, brachioradialis, 2+ at the bilateral patella and 1/4 at the bilateral achilles.  Plantar responses are downgoing bilaterally. Gait and Station: The patient is able to ambulate without difficulty.   Labs: The patient had some lab work done since last visit.  Her iron was normal at 46.  TIBC was normal at 318.   Percent saturation was slightly low at 14%.  Ferritin was normal at 62.   IMPRESSION/PLAN  1. anxiety disorder.    -Today, the symptoms sound much less like restless leg and much more like anxiety.  She would like to go back on the Klonopin and I think that is reasonable.  I gave her a weaning schedule to get her off of the Mirapex.    -We talked about adding something else for anxiety, perhaps Cymbalta but decided to hold off on that to see how she does when she gets off of the Mirapex and back on the Klonopin.  -I. will see her back in 3 weeks, sooner should new neurologic issues arise.

## 2013-04-11 DIAGNOSIS — M19049 Primary osteoarthritis, unspecified hand: Secondary | ICD-10-CM | POA: Diagnosis not present

## 2013-04-18 ENCOUNTER — Other Ambulatory Visit: Payer: Self-pay | Admitting: Endocrinology

## 2013-04-18 DIAGNOSIS — M79609 Pain in unspecified limb: Secondary | ICD-10-CM | POA: Diagnosis not present

## 2013-04-18 DIAGNOSIS — B351 Tinea unguium: Secondary | ICD-10-CM | POA: Diagnosis not present

## 2013-04-20 ENCOUNTER — Other Ambulatory Visit: Payer: Self-pay | Admitting: *Deleted

## 2013-04-20 MED ORDER — METOPROLOL TARTRATE 25 MG PO TABS: 25.0000 mg | ORAL_TABLET | Freq: Two times a day (BID) | ORAL | Status: DC

## 2013-04-21 ENCOUNTER — Other Ambulatory Visit: Payer: Self-pay | Admitting: *Deleted

## 2013-04-21 MED ORDER — METOPROLOL TARTRATE 25 MG PO TABS: ORAL_TABLET | ORAL | Status: DC

## 2013-04-21 NOTE — Telephone Encounter (Signed)
Rx did not go through electronically.

## 2013-04-25 ENCOUNTER — Encounter: Payer: Self-pay | Admitting: Neurology

## 2013-04-25 ENCOUNTER — Ambulatory Visit (INDEPENDENT_AMBULATORY_CARE_PROVIDER_SITE_OTHER): Payer: Medicare Other | Admitting: Neurology

## 2013-04-25 ENCOUNTER — Other Ambulatory Visit: Payer: Self-pay

## 2013-04-25 VITALS — BP 120/70 | HR 78 | Temp 97.7°F | Resp 12 | Wt 117.0 lb

## 2013-04-25 DIAGNOSIS — I10 Essential (primary) hypertension: Secondary | ICD-10-CM | POA: Diagnosis not present

## 2013-04-25 DIAGNOSIS — F411 Generalized anxiety disorder: Secondary | ICD-10-CM

## 2013-04-25 MED ORDER — BUSPIRONE HCL 5 MG PO TABS: 10.0000 mg | ORAL_TABLET | Freq: Two times a day (BID) | ORAL | Status: DC

## 2013-04-25 MED ORDER — METOPROLOL TARTRATE 25 MG PO TABS: 25.0000 mg | ORAL_TABLET | Freq: Two times a day (BID) | ORAL | Status: DC

## 2013-04-25 NOTE — Progress Notes (Signed)
Laura [name] was seen today in neurologic f/u for presumed RLS.  She is accompanied by a Nurse, learning disability.  She c/o these sx's in the leg for about 12 years.  The description of the symptoms is very different this time than last time.  Today, the patient states that she always has the symptoms, whether she is walking or sitting.  It does not interfere with her sleep going to sleep.  She describes it as an anxiety in her legs.  Although she states it is like a tremor, she admits that it is not visible.  Last visit, the patient told me that the clonazepam was not helping and I trying to wean her off of the medication while putting her on Mirapex.  She sent several faxes as a form of communication, but it was still very difficult to understand exactly what was helping and was not.  She is following up today in that regard.  Today, she states that the Mirapex was not helpful, and she realized that the clonazepam was helpful.  Prior to the clonazepam, she stated that she tried several anti-anxiety medications that were not as helpful as the clonazepam.  She cannot remember the names of those medications.  She does state that she has the symptoms every single day of the week, with the exception of Sunday.  When I asked her what is different about Sunday, she states that she is able to go to church and "calm down."  04/25/2013 Last visit her klonopin was restarted and mirapex d/c.  She reports that she is much better but still would like to try something additional.  She has almost no tremor in the AM but has it sometimes in the PM.  She states that she needs a RF on her metoprolol for BP.  She is unsure if it helps tremor though  PREVIOUS MEDICATIONS: Mirapex no help  ALLERGIES:  No Known Allergies  CURRENT MEDICATIONS:  Current Outpatient Prescriptions on File Prior to Visit  Medication Sig Dispense Refill  . clonazePAM (KLONOPIN) 0.5 MG tablet Take 1 tablet (0.5 mg total) by mouth 2 (two) times daily as  needed for anxiety.  60 tablet  3  . levothyroxine (SYNTHROID, LEVOTHROID) 50 MCG tablet TAKE ONE TABLET BY MOUTH EVERY DAY . PER DR. ELLISON IS OK TO USE BRAND OF SANDOZ.  30 tablet  6  . metoprolol tartrate (LOPRESSOR) 25 MG tablet Take 1 tablet (25 mg total) by mouth 2 (two) times daily. TAKE ONE-HALF TABLET BY MOUTH TWICE DAILY  30 tablet  3  . metoprolol tartrate (LOPRESSOR) 25 MG tablet TAKE ONE-HALF TABLET BY MOUTH TWICE DAILY.  30 tablet  2  . Multiple Vitamin (MULTIVITAMIN) tablet Take 1 tablet by mouth daily.        . vitamin E 100 UNIT capsule Take 100 Units by mouth 2 (two) times daily.        . clonazePAM (KLONOPIN) 0.5 MG tablet TAKE ONE TABLET BY MOUTH TWICE DAILY AS NEEDED FOR  TREMOR  60 tablet  0  . pramipexole (MIRAPEX) 0.125 MG tablet Take 1 tablet (0.125 mg total) by mouth 2 (two) times daily.  60 tablet  3   No current facility-administered medications on file prior to visit.    PAST MEDICAL HISTORY:   Past Medical History  Diagnosis Date  . HYPERLIPIDEMIA 06/22/2007    rx refused  . HYPOTHYROIDISM 04/15/2009  . Anxiety state, unspecified 10/10/2007  . RESTLESS LEG SYNDROME 10/01/2008  . HYPERTENSION 05/07/2008  .  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 TRIGGER FINGER 10/30/2009  . TREMOR 11/21/2007  . Headache(784.0) 08/27/2008  . TACHYCARDIA 08/27/2008  . ASYMPTOMATIC POSTMENOPAUSAL STATUS 04/15/2009  . Deaf   . Mild mental retardation   . Dermatitis     PAST SURGICAL HISTORY:   Past Surgical History  Procedure Laterality Date  . Cataract extraction, bilateral      SOCIAL HISTORY:   History   Social History  . Marital Status: Single    Spouse Name: N/A    Number of Children: N/A  . Years of Education: N/A   Occupational History  . Disabled    Social History Main Topics  . Smoking status: Never Smoker   . Smokeless tobacco: Never Used  . Alcohol Use: No  . Drug Use: No  . Sexually Active: Not on file   Other Topics Concern  . Not on file    Social History Narrative   Lives alone   Never married   Physical activity is very good   She has never had a driver's license   Does not have a phone at home     FAMILY HISTORY:   Family Status  Relation Status Death Age  . Mother Deceased     CVA  . Father Deceased     CVA, CAD    ROS:  Admits to some occasional dizziness.  Better if sits down.  Some off balance.  A complete 10 system review of systems was obtained and was unremarkable apart from what is mentioned above.  PHYSICAL EXAMINATION:    VITALS:   Filed Vitals:   04/25/13 1253  BP: 120/70  Pulse: 78  Temp: 97.7 F (36.5 C)  Resp: 12  Weight: 117 lb (53.071 kg)    GEN:  Normal appears female in no acute distress.  Appears stated age. HEENT:  Normocephalic, atraumatic. The mucous membranes are moist. The superficial temporal arteries are without ropiness or tenderness. Cardiovascular: Regular rate and rhythm with 3/6 SEM Lungs: Clear to auscultation bilaterally. Neck/Heme: There are no carotid bruits noted bilaterally.  NEUROLOGICAL: Orientation:  The patient is alert and oriented x 3.  Fund of knowledge is appropriate.  Recent and remote memory intact.  Attention span and concentration normal.  Repeats and names without difficulty. Cranial nerves: There is good facial symmetry. The pupils are equal round and reactive to light bilaterally. Fundoscopic exam reveals clear disc margins bilaterally. Extraocular muscles are intact and visual fields are full to confrontational testing. She speaks using sign language.  No intelligible verbal speech to assess fluency. Soft palate rises symmetrically and there is no tongue deviation. She is deaf and cannot hear. Tone: Tone is good throughout. Sensation: Sensation is intact to light touch  throughout (facial, trunk, extremities). Coordination:  The patient has no difficulty with RAM's or FNF bilaterally. Motor: Strength is 5/5 in the bilateral upper and lower  extremities.  Shoulder shrug is equal and symmetric. There is no pronator drift.  There are no fasciculations noted. DTR's:  Not tested today Gait and Station: The patient is able to ambulate without difficulty.  Movt exam:  Draws spirals without trouble  Labs: The patient had labs done.  Her iron was normal at 46.  TIBC was normal at 318.  Percent saturation was slightly low at 14%.  Ferritin was normal at 62.   IMPRESSION/PLAN  1. anxiety disorder.    -Today, the symptoms continue to sound much less like restless leg and much more like anxiety.  She would like to continue the on the Klonopin and I think that is reasonable.    -We added buspar and she will slowly titrate to 10 mg bid.  Risks, benefits, side effects and alternative therapies were discussed.  The opportunity to ask questions was given and they were answered to the best of my ability.  The patient expressed understanding and willingness to follow the outlined treatment protocols.  -I. will see her back in 4 weeks, sooner should new neurologic issues arise.  -I let Dr. Henderson Cloud nurse know that she needs a RF on her metoprolol.

## 2013-04-25 NOTE — Patient Instructions (Signed)
1.  Stay on clonazepam and OFF mirapex 2.  I talked with Dr. Henderson Cloud nurse and they will refill your metoprolol 3.  Start buspar (also known as buspirone) as follows:  5 mg once per day for 3 days, then 5 mg twice per day for a week, then 5 mg in the AM and 10 mg (2 tabs) in the PM for a week and then 10 mg (2 tablets) twice per day thereafter 4.  Follow up July 1 at 12:50pm

## 2013-05-23 ENCOUNTER — Other Ambulatory Visit: Payer: Self-pay

## 2013-05-23 ENCOUNTER — Encounter: Payer: Self-pay | Admitting: Neurology

## 2013-05-23 ENCOUNTER — Ambulatory Visit (INDEPENDENT_AMBULATORY_CARE_PROVIDER_SITE_OTHER): Payer: Medicare Other | Admitting: Neurology

## 2013-05-23 VITALS — BP 119/82 | HR 76 | Temp 97.7°F | Resp 18 | Wt 118.0 lb

## 2013-05-23 DIAGNOSIS — F411 Generalized anxiety disorder: Secondary | ICD-10-CM | POA: Diagnosis not present

## 2013-05-23 MED ORDER — METOPROLOL TARTRATE 25 MG PO TABS: ORAL_TABLET | ORAL | Status: DC

## 2013-05-23 NOTE — Patient Instructions (Signed)
1.  Take ONE tablet of clonazepam (klonopin) twice per day 2.  Take TWO tablets of buspar twice per day 3.  Your follow up appointment is August 5 at 1 pm 4.  I sent a message to Dr. Henderson Cloud assistant asking them to refill your metoprolol

## 2013-05-23 NOTE — Progress Notes (Signed)
Laura [name] was seen today in neurologic f/u for presumed RLS.  She is accompanied by a Nurse, learning disability.  She c/o these sx's in the leg for about 12 years.  The description of the symptoms is very different this time than last time.  Today, the patient states that she always has the symptoms, whether she is walking or sitting.  It does not interfere with her sleep going to sleep.  She describes it as an anxiety in her legs.  Although she states it is like a tremor, she admits that it is not visible.  Last visit, the patient told me that the clonazepam was not helping and I trying to wean her off of the medication while putting her on Mirapex.  She sent several faxes as a form of communication, but it was still very difficult to understand exactly what was helping and was not.  She is following up today in that regard.  Today, she states that the Mirapex was not helpful, and she realized that the clonazepam was helpful.  Prior to the clonazepam, she stated that she tried several anti-anxiety medications that were not as helpful as the clonazepam.  She cannot remember the names of those medications.  She does state that she has the symptoms every single day of the week, with the exception of Sunday.  When I asked her what is different about Sunday, she states that she is able to go to church and "calm down."  05/23/13 update:  The pt was started on buspar last visit in addition to her Klonopin.  She is just to be on 2 tablets of BuSpar in the morning and 2 in the afternoon, but she is misunderstood and, until yesterday, had been taking one tablet bid instead of the 2.   She was feeling quite shaky on the inside but over the last day has felt better.  Overall, the patient reports that she is better than she was prior to going on these medications.  PREVIOUS MEDICATIONS: Mirapex no help  ALLERGIES:  No Known Allergies  CURRENT MEDICATIONS:  Current Outpatient Prescriptions on File Prior to Visit  Medication  Sig Dispense Refill  . busPIRone (BUSPAR) 5 MG tablet Take 2 tablets (10 mg total) by mouth 2 (two) times daily.  120 tablet  4  . clonazePAM (KLONOPIN) 0.5 MG tablet Take 1 tablet (0.5 mg total) by mouth 2 (two) times daily as needed for anxiety.  60 tablet  3  . levothyroxine (SYNTHROID, LEVOTHROID) 50 MCG tablet TAKE ONE TABLET BY MOUTH EVERY DAY . PER DR. ELLISON IS OK TO USE BRAND OF SANDOZ.  30 tablet  6  . metoprolol tartrate (LOPRESSOR) 25 MG tablet TAKE ONE-HALF TABLET BY MOUTH TWICE DAILY.  30 tablet  2  . Multiple Vitamin (MULTIVITAMIN) tablet Take 1 tablet by mouth daily.        . vitamin E 100 UNIT capsule Take 100 Units by mouth 2 (two) times daily.         No current facility-administered medications on file prior to visit.    PAST MEDICAL HISTORY:   Past Medical History  Diagnosis Date  . HYPERLIPIDEMIA 06/22/2007    rx refused  . HYPOTHYROIDISM 04/15/2009  . Anxiety state, unspecified 10/10/2007  . RESTLESS LEG SYNDROME 10/01/2008  . HYPERTENSION 05/07/2008  . 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 TRIGGER FINGER 10/30/2009  . TREMOR 11/21/2007  . Headache(784.0) 08/27/2008  . TACHYCARDIA 08/27/2008  . ASYMPTOMATIC POSTMENOPAUSAL STATUS 04/15/2009  .  Deaf   . Mild mental retardation   . Dermatitis     PAST SURGICAL HISTORY:   Past Surgical History  Procedure Laterality Date  . Cataract extraction, bilateral      SOCIAL HISTORY:   History   Social History  . Marital Status: Single    Spouse Name: N/A    Number of Children: N/A  . Years of Education: N/A   Occupational History  . Disabled    Social History Main Topics  . Smoking status: Never Smoker   . Smokeless tobacco: Never Used  . Alcohol Use: No  . Drug Use: No  . Sexually Active: Not on file   Other Topics Concern  . Not on file   Social History Narrative   Lives alone   Never married   Physical activity is very good   She has never had a driver's license   Does not have a phone at home      FAMILY HISTORY:   Family Status  Relation Status Death Age  . Mother Deceased     CVA  . Father Deceased     CVA, CAD    ROS:  Admits to some occasional dizziness.  Better if sits down.  Some off balance.  A complete 10 system review of systems was obtained and was unremarkable apart from what is mentioned above.  PHYSICAL EXAMINATION:    VITALS:   Filed Vitals:   05/23/13 1240  BP: 119/82  Pulse: 76  Temp: 97.7 F (36.5 C)  Resp: 18  Weight: 118 lb (53.524 kg)    GEN:  Normal appears female in no acute distress.  Appears stated age. HEENT:  Normocephalic, atraumatic. The mucous membranes are moist. The superficial temporal arteries are without ropiness or tenderness. Cardiovascular: Regular rate and rhythm with 3/6 SEM Lungs: Clear to auscultation bilaterally. Neck/Heme: There are no carotid bruits noted bilaterally.  NEUROLOGICAL: Orientation:  The patient is alert and oriented x 3.  Fund of knowledge is appropriate.  Recent and remote memory intact.  Attention span and concentration normal.  Repeats and names without difficulty. Cranial nerves: There is good facial symmetry. The pupils are equal round and reactive to light bilaterally. Fundoscopic exam reveals clear disc margins bilaterally. Extraocular muscles are intact and visual fields are full to confrontational testing. She speaks using sign language.  No intelligible verbal speech to assess fluency. Soft palate rises symmetrically and there is no tongue deviation. She is deaf and cannot hear. Tone: Tone is good throughout. Sensation: Sensation is intact to light touch  throughout (facial, trunk, extremities). Coordination:  The patient has no difficulty with RAM's or FNF bilaterally. Motor: Strength is 5/5 in the bilateral upper and lower extremities.  Shoulder shrug is equal and symmetric. There is no pronator drift.  There are no fasciculations noted. DTR's:  Not tested today Gait and Station: The patient is  able to ambulate without difficulty.  Movt exam:  Draws spirals without trouble  Labs: The patient had labs done.  Her iron was normal at 46.  TIBC was normal at 318.  Percent saturation was slightly low at 14%.  Ferritin was normal at 62.   IMPRESSION/PLAN  1. anxiety disorder.    -Today, the symptoms continue to sound much less like restless leg and much more like anxiety.  She would like to continue the on the Klonopin and I think that is reasonable.    -We continued the buspar since she just got up to the  10 mg bid.  Risks, benefits, side effects and alternative therapies were discussed.  The opportunity to ask questions was given and they were answered to the best of my ability.  The patient expressed understanding and willingness to follow the outlined treatment protocols.  -I. will see her back in 4 weeks, sooner should new neurologic issues arise.  -I let Dr. Henderson Cloud nurse know that she needs a RF on her metoprolol again

## 2013-06-20 DIAGNOSIS — M19049 Primary osteoarthritis, unspecified hand: Secondary | ICD-10-CM | POA: Diagnosis not present

## 2013-06-27 ENCOUNTER — Ambulatory Visit (INDEPENDENT_AMBULATORY_CARE_PROVIDER_SITE_OTHER): Payer: Medicare Other | Admitting: Neurology

## 2013-06-27 ENCOUNTER — Encounter: Payer: Self-pay | Admitting: Neurology

## 2013-06-27 VITALS — BP 140/78 | HR 72 | Temp 97.9°F | Resp 18 | Wt 119.0 lb

## 2013-06-27 DIAGNOSIS — F411 Generalized anxiety disorder: Secondary | ICD-10-CM | POA: Diagnosis not present

## 2013-06-27 MED ORDER — BUSPIRONE HCL 15 MG PO TABS: 15.0000 mg | ORAL_TABLET | Freq: Two times a day (BID) | ORAL | Status: DC

## 2013-06-27 NOTE — Patient Instructions (Addendum)
1.  Stop your current buspar and instead fill the new prescription for 15 mg, ONE tablet twice per day 2.  Continue your clonazepam as previously prescribed

## 2013-06-27 NOTE — Progress Notes (Signed)
Laura [name] was seen today in neurologic f/u for presumed RLS.  She is accompanied by a Nurse, learning disability.  She c/o these sx's in the leg for about 12 years.  The description of the symptoms is very different this time than last time.  Today, the patient states that she always has the symptoms, whether she is walking or sitting.  It does not interfere with her sleep going to sleep.  She describes it as an anxiety in her legs.  Although she states it is like a tremor, she admits that it is not visible.  Last visit, the patient told me that the clonazepam was not helping and I trying to wean her off of the medication while putting her on Mirapex.  She sent several faxes as a form of communication, but it was still very difficult to understand exactly what was helping and was not.  She is following up today in that regard.  Today, she states that the Mirapex was not helpful, and she realized that the clonazepam was helpful.  Prior to the clonazepam, she stated that she tried several anti-anxiety medications that were not as helpful as the clonazepam.  She cannot remember the names of those medications.  She does state that she has the symptoms every single day of the week, with the exception of Sunday.  When I asked her what is different about Sunday, she states that she is able to go to church and "calm down."  05/23/13 update:  The pt was started on buspar last visit in addition to her Klonopin.  She is just to be on 2 tablets of BuSpar in the morning and 2 in the afternoon, but she is misunderstood and, until yesterday, had been taking one tablet bid instead of the 2.   She was feeling quite shaky on the inside but over the last day has felt better.  Overall, the patient reports that she is better than she was prior to going on these medications.  06/27/13 update:  The pt is accompanied again by the deaf interpreter.  Pt reports that buspar may be helping some but the klonopin is not.  We reminded her that she  got much worse in the past when klonopin was d/c and while she remembered that, she still thinks that the klonopin doesn't help.  She continues to report tremor in the legs.  It is an inner tremor and cannot be seen.  Walking does not help it.  It comes and goes.  It does not prevent sleep and is more common in the day.  She insists its not anxiety.  She has no sx's on Sunday and the only thing different about this day is that its her church day and she feels more "calm."  PREVIOUS MEDICATIONS: Mirapex no help  ALLERGIES:  No Known Allergies  CURRENT MEDICATIONS:  Current Outpatient Prescriptions on File Prior to Visit  Medication Sig Dispense Refill  . busPIRone (BUSPAR) 5 MG tablet Take 2 tablets (10 mg total) by mouth 2 (two) times daily.  120 tablet  4  . clonazePAM (KLONOPIN) 0.5 MG tablet Take 1 tablet (0.5 mg total) by mouth 2 (two) times daily as needed for anxiety.  60 tablet  3  . levothyroxine (SYNTHROID, LEVOTHROID) 50 MCG tablet TAKE ONE TABLET BY MOUTH EVERY DAY . PER DR. ELLISON IS OK TO USE BRAND OF SANDOZ.  30 tablet  6  . metoprolol tartrate (LOPRESSOR) 25 MG tablet TAKE ONE-HALF TABLET BY MOUTH TWICE DAILY.  30 tablet  5  . Multiple Vitamin (MULTIVITAMIN) tablet Take 1 tablet by mouth daily.        . vitamin E 100 UNIT capsule Take 100 Units by mouth 2 (two) times daily.         No current facility-administered medications on file prior to visit.    PAST MEDICAL HISTORY:   Past Medical History  Diagnosis Date  . HYPERLIPIDEMIA 06/22/2007    rx refused  . HYPOTHYROIDISM 04/15/2009  . Anxiety state, unspecified 10/10/2007  . RESTLESS LEG SYNDROME 10/01/2008  . HYPERTENSION 05/07/2008  . 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 TRIGGER FINGER 10/30/2009  . TREMOR 11/21/2007  . Headache(784.0) 08/27/2008  . TACHYCARDIA 08/27/2008  . ASYMPTOMATIC POSTMENOPAUSAL STATUS 04/15/2009  . Deaf   . Mild mental retardation   . Dermatitis     PAST SURGICAL HISTORY:   Past Surgical  History  Procedure Laterality Date  . Cataract extraction, bilateral      SOCIAL HISTORY:   History   Social History  . Marital Status: Single    Spouse Name: N/A    Number of Children: N/A  . Years of Education: N/A   Occupational History  . Disabled    Social History Main Topics  . Smoking status: Never Smoker   . Smokeless tobacco: Never Used  . Alcohol Use: No  . Drug Use: No  . Sexually Active: Not on file   Other Topics Concern  . Not on file   Social History Narrative   Lives alone   Never married   Physical activity is very good   She has never had a driver's license   Does not have a phone at home     FAMILY HISTORY:   Family Status  Relation Status Death Age  . Mother Deceased     CVA  . Father Deceased     CVA, CAD    ROS:  Admits to some occasional dizziness.  Better if sits down.  Some off balance.  A complete 10 system review of systems was obtained and was unremarkable apart from what is mentioned above.  PHYSICAL EXAMINATION:    VITALS:   Filed Vitals:   06/27/13 1243  BP: 140/78  Pulse: 72  Temp: 97.9 F (36.6 C)  Resp: 18  Weight: 119 lb (53.978 kg)    GEN:  Normal appears female in no acute distress.  Appears stated age.   NEUROLOGICAL: Orientation:  The patient is alert and oriented x 3.  Fund of knowledge is appropriate.   Cranial nerves: There is good facial symmetry. Extraocular muscles are intact.   She speaks using sign language.  No intelligible verbal speech to assess fluency. Soft palate rises symmetrically and there is no tongue deviation. She is deaf and cannot hear. Tone: Tone is good throughout. Sensation: Sensation is intact to light touch  throughout (facial, trunk, extremities). Coordination:  The patient has no difficulty with RAM's or FNF bilaterally. Motor: Strength is 5/5 in the bilateral upper and lower extremities.  Shoulder shrug is equal and symmetric. There is no pronator drift.  There are no  fasciculations noted. DTR's:  Not tested today Gait and Station: The patient is able to ambulate without difficulty.    Labs: The patient had labs done.  Her iron was normal at 46.  TIBC was normal at 318.  Percent saturation was slightly low at 14%.  Ferritin was normal at 62.   IMPRESSION/PLAN  1. anxiety disorder.    -  Today, the symptoms continue to sound much less like restless leg and much more like anxiety.  The pt does not think that this is anxiety and does not wish to seek psychiatric opinion.  I am going to increase the buspar but I also explained to her that this is far out of my area of expertise. I feel strongly that her symptoms are not consistent with restless leg or even akathisia.  Her BuSpar will be increased to 15 mg twice a day.  I do not recommend she discontinue the Klonopin, as she got worse when we do this in the past.  While it is my recommendation that she seek an expert in this area, she would like to followup with Dr. [name] first, which I think is very reasonable given that he is her primary care physician.  She will be discharged from my care, as I really have nothing further to offer.

## 2013-07-18 ENCOUNTER — Encounter: Payer: Self-pay | Admitting: Endocrinology

## 2013-07-18 ENCOUNTER — Ambulatory Visit (INDEPENDENT_AMBULATORY_CARE_PROVIDER_SITE_OTHER): Payer: Medicare Other | Admitting: Endocrinology

## 2013-07-18 VITALS — BP 130/78 | Ht 62.0 in | Wt 119.0 lb

## 2013-07-18 DIAGNOSIS — R259 Unspecified abnormal involuntary movements: Secondary | ICD-10-CM | POA: Diagnosis not present

## 2013-07-18 MED ORDER — CLONAZEPAM 1 MG PO TABS: 1.0000 mg | ORAL_TABLET | Freq: Two times a day (BID) | ORAL | Status: DC | PRN

## 2013-07-18 NOTE — Patient Instructions (Addendum)
Here is a prescription to double the clonazepam. Please come back for a follow-up appointment in 1-2 months.

## 2013-07-18 NOTE — Progress Notes (Signed)
  Subjective:    Patient ID: Laura [name], female    DOB: 11-04-1941, 72 y.o.   MRN: [id number]  HPI Tremor persists.  Pt states few mos of moderate hair loss throughout the head, she feels this is caused by klonopin.   Past Medical History  Diagnosis Date  . HYPERLIPIDEMIA 06/22/2007    rx refused  . HYPOTHYROIDISM 04/15/2009  . Anxiety state, unspecified 10/10/2007  . RESTLESS LEG SYNDROME 10/01/2008  . HYPERTENSION 05/07/2008  . 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 TRIGGER FINGER 10/30/2009  . TREMOR 11/21/2007  . Headache(784.0) 08/27/2008  . TACHYCARDIA 08/27/2008  . ASYMPTOMATIC POSTMENOPAUSAL STATUS 04/15/2009  . Deaf   . Mild mental retardation   . Dermatitis     Past Surgical History  Procedure Laterality Date  . Cataract extraction, bilateral      History   Social History  . Marital Status: Single    Spouse Name: N/A    Number of Children: N/A  . Years of Education: N/A   Occupational History  . Disabled    Social History Main Topics  . Smoking status: Never Smoker   . Smokeless tobacco: Never Used  . Alcohol Use: No  . Drug Use: No  . Sexual Activity: Not on file   Other Topics Concern  . Not on file   Social History Narrative   Lives alone   Never married   Physical activity is very good   She has never had a driver's license   Does not have a phone at home     Current Outpatient Prescriptions on File Prior to Visit  Medication Sig Dispense Refill  . busPIRone (BUSPAR) 15 MG tablet Take 1 tablet (15 mg total) by mouth 2 (two) times daily.  60 tablet  3  . levothyroxine (SYNTHROID, LEVOTHROID) 50 MCG tablet TAKE ONE TABLET BY MOUTH EVERY DAY . PER DR. Keston Seever IS OK TO USE BRAND OF SANDOZ.  30 tablet  6  . metoprolol tartrate (LOPRESSOR) 25 MG tablet TAKE ONE-HALF TABLET BY MOUTH TWICE DAILY.  30 tablet  5  . Multiple Vitamin (MULTIVITAMIN) tablet Take 1 tablet by mouth daily.        . vitamin E 100 UNIT capsule Take 100 Units by mouth 2 (two)  times daily.         No current facility-administered medications on file prior to visit.    No Known Allergies  Family History  Problem Relation Age of Onset  . Thyroid disease Neg Hx     BP 130/78  Ht 5\' 2"  (1.575 m)  Wt 119 lb (53.978 kg)  BMI 21.76 kg/m2  SpO2 94%  Review of Systems She has insomnia.  Anxiety persists    Objective:   Physical Exam VITAL SIGNS:  See vs page GENERAL: no distress Hair: mildly thin      Assessment & Plan:  Hair loss, not due to klonopin Anxiety, she needs increased rx Tremor, due to anxiety

## 2013-08-01 DIAGNOSIS — B351 Tinea unguium: Secondary | ICD-10-CM | POA: Diagnosis not present

## 2013-08-01 DIAGNOSIS — M79609 Pain in unspecified limb: Secondary | ICD-10-CM | POA: Diagnosis not present

## 2013-08-09 ENCOUNTER — Encounter: Payer: Self-pay | Admitting: Endocrinology

## 2013-09-15 ENCOUNTER — Other Ambulatory Visit: Payer: Self-pay

## 2013-09-15 MED ORDER — LEVOTHYROXINE SODIUM 50 MCG PO TABS: ORAL_TABLET | ORAL | Status: DC

## 2013-09-18 ENCOUNTER — Ambulatory Visit (INDEPENDENT_AMBULATORY_CARE_PROVIDER_SITE_OTHER): Payer: Medicare Other | Admitting: Endocrinology

## 2013-09-18 VITALS — BP 100/60 | Temp 97.8°F | Wt 119.0 lb

## 2013-09-18 DIAGNOSIS — F411 Generalized anxiety disorder: Secondary | ICD-10-CM

## 2013-09-18 DIAGNOSIS — R21 Rash and other nonspecific skin eruption: Secondary | ICD-10-CM | POA: Diagnosis not present

## 2013-09-18 MED ORDER — TRIAMCINOLONE ACETONIDE 0.1 % EX CREA: TOPICAL_CREAM | Freq: Two times a day (BID) | CUTANEOUS | Status: DC

## 2013-09-18 NOTE — Progress Notes (Signed)
  Subjective:    Patient ID: Laura [name], female    DOB: 10/20/1941, 72 y.o.   MRN: [id number]  HPI Pt states few years of moderate tremor of the legs, and assoc anxiety.  She has seen several neurologists, who agree this is a symptom of anxiety.   Past Medical History  Diagnosis Date  . HYPERLIPIDEMIA 06/22/2007    rx refused  . HYPOTHYROIDISM 04/15/2009  . Anxiety state, unspecified 10/10/2007  . RESTLESS LEG SYNDROME 10/01/2008  . HYPERTENSION 05/07/2008  . 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 TRIGGER FINGER 10/30/2009  . TREMOR 11/21/2007  . Headache(784.0) 08/27/2008  . TACHYCARDIA 08/27/2008  . ASYMPTOMATIC POSTMENOPAUSAL STATUS 04/15/2009  . Deaf   . Mild mental retardation   . Dermatitis     Past Surgical History  Procedure Laterality Date  . Cataract extraction, bilateral      History   Social History  . Marital Status: Single    Spouse Name: N/A    Number of Children: N/A  . Years of Education: N/A   Occupational History  . Disabled    Social History Main Topics  . Smoking status: Never Smoker   . Smokeless tobacco: Never Used  . Alcohol Use: No  . Drug Use: No  . Sexual Activity: Not on file   Other Topics Concern  . Not on file   Social History Narrative   Lives alone   Never married   Physical activity is very good   She has never had a driver's license   Does not have a phone at home     Current Outpatient Prescriptions on File Prior to Visit  Medication Sig Dispense Refill  . busPIRone (BUSPAR) 15 MG tablet Take 1 tablet (15 mg total) by mouth 2 (two) times daily.  60 tablet  3  . clonazePAM (KLONOPIN) 1 MG tablet Take 1 tablet (1 mg total) by mouth 2 (two) times daily as needed for anxiety.  60 tablet  1  . levothyroxine (SYNTHROID, LEVOTHROID) 50 MCG tablet TAKE ONE TABLET BY MOUTH EVERY DAY . PER DR. Hanz Winterhalter IS OK TO USE BRAND OF SANDOZ.  30 tablet  6  . metoprolol tartrate (LOPRESSOR) 25 MG tablet TAKE ONE-HALF TABLET BY MOUTH TWICE  DAILY.  30 tablet  5  . Multiple Vitamin (MULTIVITAMIN) tablet Take 1 tablet by mouth daily.        . vitamin E 100 UNIT capsule Take 100 Units by mouth 2 (two) times daily.         No current facility-administered medications on file prior to visit.   No Known Allergies  Family History  Problem Relation Age of Onset  . Thyroid disease Neg Hx    BP 100/60  Temp(Src) 97.8 F (36.6 C) (Oral)  Wt 119 lb (53.978 kg)  BMI 21.76 kg/m2  Review of Systems She has insomnia.  She has itching of the legs.    Objective:   Physical Exam VITAL SIGNS:  See vs page GENERAL: no distress Neuro: no tremor is noted now.   Legs: moderate eczematous rash.       Assessment & Plan:  Rash, new, uncertain etiology Anxiety, persistent Tremor, due to anxiety

## 2013-09-18 NOTE — Patient Instructions (Addendum)
Please come back for a "medicare wellness" appointment any day after 01/16/14. Refer to a psychiatry specialist.  you will receive a phone call, about a day and time for an appointment.

## 2013-09-19 ENCOUNTER — Encounter: Payer: Self-pay | Admitting: Endocrinology

## 2013-09-21 ENCOUNTER — Telehealth: Payer: Self-pay | Admitting: *Deleted

## 2013-09-21 NOTE — Telephone Encounter (Signed)
Pt sent a fax requesting a refill for Klonopin and for Triamcinolone cream Pt upset and states she needs the cream because her legs and shoulders "scratch." Please advise.

## 2013-09-22 ENCOUNTER — Other Ambulatory Visit: Payer: Self-pay | Admitting: Endocrinology

## 2013-09-22 MED ORDER — CLONAZEPAM 1 MG PO TABS: 1.0000 mg | ORAL_TABLET | Freq: Two times a day (BID) | ORAL | Status: DC | PRN

## 2013-09-22 MED ORDER — TRIAMCINOLONE ACETONIDE 0.1 % EX CREA: TOPICAL_CREAM | Freq: Two times a day (BID) | CUTANEOUS | Status: DC

## 2013-10-18 ENCOUNTER — Encounter: Payer: Self-pay | Admitting: Podiatry

## 2013-10-24 ENCOUNTER — Ambulatory Visit (INDEPENDENT_AMBULATORY_CARE_PROVIDER_SITE_OTHER): Payer: Medicare Other | Admitting: Podiatry

## 2013-10-24 ENCOUNTER — Encounter: Payer: Self-pay | Admitting: Podiatry

## 2013-10-24 VITALS — BP 145/80 | HR 84 | Resp 12 | Ht 63.0 in | Wt 118.0 lb

## 2013-10-24 DIAGNOSIS — B351 Tinea unguium: Secondary | ICD-10-CM

## 2013-10-24 DIAGNOSIS — M79609 Pain in unspecified limb: Secondary | ICD-10-CM

## 2013-10-24 NOTE — Progress Notes (Signed)
Laura [name] presents today with a chief complaint of painful toenails and calluses to the tips of the toes bilaterally.  Objective: Pulses remain palpable bilateral hammertoe deformity is evident bilateral flexible. 2 through 5 nails are thick yellow dystrophic with mycotic and painful palpation as well as debridement.  Assessment: Pain in limb secondary to onychomycosis 1 through 5 bilateral. Hammertoe deformity 2 through 5 bilateral. Distal clavi 2 through 5.  Plan: Debridement of nails 1 through 5 bilateral is cover service secondary to pain followup with her in 3 months.

## 2013-11-09 ENCOUNTER — Other Ambulatory Visit: Payer: Self-pay

## 2013-11-09 MED ORDER — TRIAMCINOLONE ACETONIDE 0.1 % EX CREA: TOPICAL_CREAM | Freq: Two times a day (BID) | CUTANEOUS | Status: DC

## 2013-11-21 ENCOUNTER — Other Ambulatory Visit: Payer: Self-pay | Admitting: Neurology

## 2013-11-21 ENCOUNTER — Other Ambulatory Visit: Payer: Self-pay | Admitting: *Deleted

## 2013-11-21 ENCOUNTER — Other Ambulatory Visit: Payer: Self-pay | Admitting: Endocrinology

## 2013-11-21 MED ORDER — METOPROLOL TARTRATE 25 MG PO TABS: ORAL_TABLET | ORAL | Status: DC

## 2013-11-21 NOTE — Telephone Encounter (Signed)
Please advise during Dr. George Hugh absence. Thanks!

## 2013-11-22 ENCOUNTER — Other Ambulatory Visit: Payer: Self-pay

## 2013-11-27 ENCOUNTER — Encounter: Payer: Self-pay | Admitting: Endocrinology

## 2013-11-27 ENCOUNTER — Ambulatory Visit (INDEPENDENT_AMBULATORY_CARE_PROVIDER_SITE_OTHER): Payer: Medicare Other | Admitting: Endocrinology

## 2013-11-27 VITALS — BP 122/78 | HR 88 | Temp 97.8°F | Ht 63.0 in | Wt 118.0 lb

## 2013-11-27 DIAGNOSIS — R259 Unspecified abnormal involuntary movements: Secondary | ICD-10-CM | POA: Diagnosis not present

## 2013-11-27 MED ORDER — CLONAZEPAM 1 MG PO TABS: 1.0000 mg | ORAL_TABLET | Freq: Two times a day (BID) | ORAL | Status: DC | PRN

## 2013-11-27 NOTE — Patient Instructions (Signed)
Here are 3 psychiatrists:   Dr Archer AsaGerald Plovsky  [facility]  [address]  [place]  [phone]  Fax: [phone]   Dr Alanson Alyary Cottle  [facility]  [address]  [place] [address]  [phone]  Fax: [phone]   Dr Lester Carolinaupinder Kaur  [facility]  [address]  [place] [address]  [phone]   Please come back for a "medicare wellness" appointment any day after 01/16/14.

## 2013-11-28 NOTE — Progress Notes (Signed)
   Subjective:    Patient ID: Laura ShortsNancy M Nobile, female    DOB: [birth year], 73 y.o.   MRN: [id number]  HPI Pt says she continues to suffer from severe anxiety.  She did not see psychiatry. Tremor persists  Past Medical History  Diagnosis Date  . HYPERLIPIDEMIA 06/22/2007    rx refused  . HYPOTHYROIDISM 04/15/2009  . Anxiety state, unspecified 10/10/2007  . RESTLESS LEG SYNDROME 10/01/2008  . HYPERTENSION 05/07/2008  . 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 TRIGGER FINGER 10/30/2009  . TREMOR 11/21/2007  . Headache(784.0) 08/27/2008  . TACHYCARDIA 08/27/2008  . ASYMPTOMATIC POSTMENOPAUSAL STATUS 04/15/2009  . Deaf   . Mild mental retardation   . Dermatitis     Past Surgical History  Procedure Laterality Date  . Cataract extraction, bilateral      History   Social History  . Marital Status: Single    Spouse Name: N/A    Number of Children: N/A  . Years of Education: N/A   Occupational History  . Disabled    Social History Main Topics  . Smoking status: Never Smoker   . Smokeless tobacco: Never Used  . Alcohol Use: No  . Drug Use: No  . Sexual Activity: Not on file   Other Topics Concern  . Not on file   Social History Narrative   Lives alone   Never married   Physical activity is very good   She has never had a driver's license   Does not have a phone at home     Current Outpatient Prescriptions on File Prior to Visit  Medication Sig Dispense Refill  . levothyroxine (SYNTHROID, LEVOTHROID) 50 MCG tablet TAKE ONE TABLET BY MOUTH EVERY DAY . PER DR. Francine Hannan IS OK TO USE BRAND OF SANDOZ.  30 tablet  6  . metoprolol tartrate (LOPRESSOR) 25 MG tablet TAKE ONE-HALF TABLET BY MOUTH TWICE DAILY.  30 tablet  5  . Multiple Vitamin (MULTIVITAMIN) tablet Take 1 tablet by mouth daily.        Marland Kitchen. triamcinolone cream (KENALOG) 0.1 % Apply topically 2 (two) times daily.  30 g  0  . vitamin E 100 UNIT capsule Take 100 Units by mouth 2 (two) times daily.        . busPIRone  (BUSPAR) 15 MG tablet Take 1 tablet (15 mg total) by mouth 2 (two) times daily.  60 tablet  3   No current facility-administered medications on file prior to visit.    No Known Allergies  Family History  Problem Relation Age of Onset  . Thyroid disease Neg Hx     BP 122/78  Pulse 88  Temp(Src) 97.8 F (36.6 C) (Oral)  Ht 5\' 3"  (1.6 m)  Wt 118 lb (53.524 kg)  BMI 20.91 kg/m2  SpO2 97%  Review of Systems She has insomnia also.    Objective:   Physical Exam VITAL SIGNS:  See vs page GENERAL: no distress PSYCH: Alert and well-oriented.  Does not appear anxious nor depressed.     Assessment & Plan:  Anxiety, persistent Tremor, due to anxiety

## 2013-11-29 ENCOUNTER — Telehealth: Payer: Self-pay

## 2013-11-29 NOTE — Telephone Encounter (Signed)
Laura [name] from Dr. Alwyn Renottle's office called in reguards to a referral for pt. The only referral for psychiatry is from 09/18/2013. Office wanted to verify referral before contacting pt. Also Dr. Alwyn Renottle's office does not take medicare part-D. Please advise, Thanks!

## 2013-11-29 NOTE — Telephone Encounter (Signed)
Please advise pt to fax the other dr's office

## 2013-12-04 NOTE — Telephone Encounter (Signed)
Pt faxing Dr. Alwyn Renottle's office. Advised office ok to proceed with visit.

## 2013-12-07 ENCOUNTER — Other Ambulatory Visit: Payer: Self-pay | Admitting: Endocrinology

## 2013-12-07 ENCOUNTER — Telehealth: Payer: Self-pay

## 2013-12-07 MED ORDER — BUSPIRONE HCL 15 MG PO TABS: 15.0000 mg | ORAL_TABLET | Freq: Two times a day (BID) | ORAL | Status: DC

## 2013-12-07 NOTE — Telephone Encounter (Signed)
Pt contacted Dr. Arbutus Leasat via fax. Pt was requesting refills on Buspar and Kolnopin. On 06/27/2013 pt was discharged from Dr. Arbutus Leasat. Please advise if ok to refill these medication.

## 2013-12-07 NOTE — Telephone Encounter (Signed)
Clonazepam was refilled next week. i refilled buspar

## 2013-12-08 NOTE — Telephone Encounter (Signed)
Pt informed via fax.

## 2013-12-11 ENCOUNTER — Telehealth: Payer: Self-pay

## 2013-12-11 NOTE — Telephone Encounter (Signed)
Dr. Alwyn Renottle's office called stating that he believed that a psychiatric visit was not necessary for restless leg that a PCP could take care.

## 2013-12-28 ENCOUNTER — Telehealth: Payer: Self-pay

## 2013-12-28 NOTE — Telephone Encounter (Signed)
We have to balance the relief of tremor with the side-effect of drowsiness.  i think it is best to continue the same for now

## 2013-12-28 NOTE — Telephone Encounter (Signed)
Pt informed via fax.

## 2013-12-28 NOTE — Telephone Encounter (Signed)
Pt sent a fax to the office requesting that her Clonazepam be changed from 1 mg to 15 mg due to her legs being restless.  Please advise, Thanks!

## 2013-12-29 ENCOUNTER — Telehealth: Payer: Self-pay

## 2013-12-29 MED ORDER — CLONAZEPAM 1 MG PO TABS: 1.0000 mg | ORAL_TABLET | Freq: Two times a day (BID) | ORAL | Status: DC | PRN

## 2013-12-29 NOTE — Telephone Encounter (Signed)
Pt sent a fax requesting a refill on Clonazepam.  Last filled on 11/27/2013.  Please advise, Thanks!

## 2013-12-29 NOTE — Telephone Encounter (Signed)
i printed 

## 2013-12-29 NOTE — Telephone Encounter (Signed)
Script faxed to pharmacy. Pt notified via fax.

## 2014-01-19 ENCOUNTER — Encounter: Payer: Self-pay | Admitting: Endocrinology

## 2014-01-19 ENCOUNTER — Ambulatory Visit (INDEPENDENT_AMBULATORY_CARE_PROVIDER_SITE_OTHER): Payer: Medicare Other | Admitting: Endocrinology

## 2014-01-19 VITALS — BP 120/82 | HR 76 | Temp 97.7°F | Ht 63.0 in | Wt 119.0 lb

## 2014-01-19 DIAGNOSIS — I10 Essential (primary) hypertension: Secondary | ICD-10-CM | POA: Diagnosis not present

## 2014-01-19 DIAGNOSIS — Z79899 Other long term (current) drug therapy: Secondary | ICD-10-CM

## 2014-01-19 DIAGNOSIS — E039 Hypothyroidism, unspecified: Secondary | ICD-10-CM

## 2014-01-19 DIAGNOSIS — R259 Unspecified abnormal involuntary movements: Secondary | ICD-10-CM

## 2014-01-19 DIAGNOSIS — Z0000 Encounter for general adult medical examination without abnormal findings: Secondary | ICD-10-CM | POA: Insufficient documentation

## 2014-01-19 DIAGNOSIS — E785 Hyperlipidemia, unspecified: Secondary | ICD-10-CM | POA: Diagnosis not present

## 2014-01-19 LAB — BASIC METABOLIC PANEL
BUN: 17 mg/dL (ref 6–23)
CALCIUM: 8.7 mg/dL (ref 8.4–10.5)
CO2: 30 mEq/L (ref 19–32)
CREATININE: 1 mg/dL (ref 0.4–1.2)
Chloride: 100 mEq/L (ref 96–112)
GFR: 59.49 mL/min — AB (ref >60.00)
GLUCOSE: 102 mg/dL — AB (ref 70–99)
POTASSIUM: 4.2 meq/L (ref 3.5–5.1)
Sodium: 135 mEq/L (ref 135–145)

## 2014-01-19 LAB — HEPATIC FUNCTION PANEL
ALBUMIN: 3.7 g/dL (ref 3.5–5.2)
ALT: 15 U/L (ref 0–35)
AST: 20 U/L (ref 0–37)
Alkaline Phosphatase: 64 U/L (ref 39–117)
Bilirubin, Direct: 0 mg/dL (ref 0.0–0.3)
TOTAL PROTEIN: 6.7 g/dL (ref 6.0–8.3)
Total Bilirubin: 0.4 mg/dL (ref 0.3–1.2)

## 2014-01-19 LAB — LDL CHOLESTEROL, DIRECT: Direct LDL: 85.3 mg/dL

## 2014-01-19 LAB — CBC WITH DIFFERENTIAL/PLATELET
BASOS ABS: 0 10*3/uL (ref 0.0–0.1)
Basophils Relative: 0.6 % (ref 0.0–3.0)
EOS ABS: 0.2 10*3/uL (ref 0.0–0.7)
Eosinophils Relative: 2.7 % (ref 0.0–5.0)
HCT: 38.8 % (ref 36.0–46.0)
HEMOGLOBIN: 12.7 g/dL (ref 12.0–15.0)
LYMPHS PCT: 27.8 % (ref 12.0–46.0)
Lymphs Abs: 1.6 10*3/uL (ref 0.7–4.0)
MCHC: 32.7 g/dL (ref 30.0–36.0)
MCV: 98.1 fl (ref 78.0–100.0)
MONOS PCT: 10.9 % (ref 3.0–12.0)
Monocytes Absolute: 0.6 10*3/uL (ref 0.1–1.0)
NEUTROS ABS: 3.4 10*3/uL (ref 1.4–7.7)
NEUTROS PCT: 58 % (ref 43.0–77.0)
Platelets: 252 10*3/uL (ref 150.0–400.0)
RBC: 3.96 Mil/uL (ref 3.87–5.11)
RDW: 12.6 % (ref 11.5–14.6)
WBC: 5.8 10*3/uL (ref 4.5–10.5)

## 2014-01-19 LAB — LIPID PANEL
Cholesterol: 186 mg/dL (ref 0–200)
HDL: 47.3 mg/dL (ref >39.00)
TRIGLYCERIDES: 479 mg/dL — AB (ref 0.0–149.0)
Total CHOL/HDL Ratio: 4
VLDL: 95.8 mg/dL — AB (ref 0.0–40.0)

## 2014-01-19 LAB — TSH: TSH: 2.01 u[IU]/mL (ref 0.35–5.50)

## 2014-01-19 MED ORDER — TRIAMCINOLONE ACETONIDE 0.1 % EX CREA: 1.0000 "application " | TOPICAL_CREAM | Freq: Three times a day (TID) | CUTANEOUS | Status: DC

## 2014-01-19 NOTE — Progress Notes (Signed)
Subjective:    Patient ID: Laura [name], female    DOB: 09/18/1941, 73 y.o.   MRN: [id number]  HPI Tremor: pt saw neurology, who said sxs were due to anxiety.  Neurology recommended pt not f/u there.  Anxiety: psychiatrist declined to see patient, saying there was nothing he could do.  sxs persist. HTN: she denies sob Dyslipidemia: she denies chest pain. Past Medical History  Diagnosis Date  . HYPERLIPIDEMIA 06/22/2007    rx refused  . HYPOTHYROIDISM 04/15/2009  . Anxiety state, unspecified 10/10/2007  . RESTLESS LEG SYNDROME 10/01/2008  . HYPERTENSION 05/07/2008  . 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 TRIGGER FINGER 10/30/2009  . TREMOR 11/21/2007  . Headache(784.0) 08/27/2008  . TACHYCARDIA 08/27/2008  . ASYMPTOMATIC POSTMENOPAUSAL STATUS 04/15/2009  . Deaf   . Mild mental retardation   . Dermatitis     Past Surgical History  Procedure Laterality Date  . Cataract extraction, bilateral      History   Social History  . Marital Status: Single    Spouse Name: N/A    Number of Children: N/A  . Years of Education: N/A   Occupational History  . Disabled    Social History Main Topics  . Smoking status: Never Smoker   . Smokeless tobacco: Never Used  . Alcohol Use: No  . Drug Use: No  . Sexual Activity: Not on file   Other Topics Concern  . Not on file   Social History Narrative   Lives alone   Never married   Physical activity is very good   She has never had a driver's license   Does not have a phone at home     Current Outpatient Prescriptions on File Prior to Visit  Medication Sig Dispense Refill  . busPIRone (BUSPAR) 15 MG tablet Take 1 tablet (15 mg total) by mouth 2 (two) times daily.  60 tablet  11  . clonazePAM (KLONOPIN) 1 MG tablet Take 1 tablet (1 mg total) by mouth 2 (two) times daily as needed for anxiety.  60 tablet  4  . levothyroxine (SYNTHROID, LEVOTHROID) 50 MCG tablet TAKE ONE TABLET BY MOUTH EVERY DAY . PER DR. Morgen Linebaugh IS OK TO USE  BRAND OF SANDOZ.  30 tablet  6  . metoprolol tartrate (LOPRESSOR) 25 MG tablet TAKE ONE-HALF TABLET BY MOUTH TWICE DAILY.  30 tablet  5  . Multiple Vitamin (MULTIVITAMIN) tablet Take 1 tablet by mouth daily.        . vitamin E 100 UNIT capsule Take 100 Units by mouth 2 (two) times daily.         No current facility-administered medications on file prior to visit.    No Known Allergies  Family History  Problem Relation Age of Onset  . Thyroid disease Neg Hx     BP 120/82  Pulse 76  Temp(Src) 97.7 F (36.5 C) (Oral)  Ht 5\' 3"  (1.6 m)  Wt 119 lb (53.978 kg)  BMI 21.09 kg/m2  SpO2 98%  Review of Systems Denies weight change and edema.    Objective:   Physical Exam VITAL SIGNS:  See vs page GENERAL: no distress LUNGS:  Clear to auscultation HEART:  Regular rate and rhythm without murmurs noted. Normal S1,S2.   Ext: no edema Neuro: slight tremor of the hands PSYCH: Alert and well-oriented.  Anxious.    Lab Results  Component Value Date   WBC 5.8 01/19/2014   HGB 12.7 01/19/2014   HCT 38.8 01/19/2014  PLT 252.0 01/19/2014   GLUCOSE 102* 01/19/2014   CHOL 186 01/19/2014   TRIG 479.0* 01/19/2014   HDL 47.30 01/19/2014   LDLDIRECT 85.3 01/19/2014   ALT 15 01/19/2014   AST 20 01/19/2014   NA 135 01/19/2014   K 4.2 01/19/2014   CL 100 01/19/2014   CREATININE 1.0 01/19/2014   BUN 17 01/19/2014   CO2 30 01/19/2014   TSH 2.01 01/19/2014   electrocardiogram is not done, as both machines are broken.      Assessment & Plan:  Dyslipidemia: this lab is not fasting, but TG is still high. HTN: well-controlled Anxiety: I'll rx as best I can.   Subjective:   Patient here for Medicare annual wellness visit and management of other chronic and acute problems.     Risk factors: advanced age.   Roster of Physicians Providing Medical Care to Patient:  See "snapshot"   Activities of Daily Living: In your present state of health, do you have any difficulty performing the following  activities?:  Preparing food and eating?: No  Bathing yourself: No  Getting dressed: No  Using the toilet:No  Moving around from place to place: No  In the past year have you fallen or had a near fall?:No    Home Safety: Has smoke detector and wears seat belts. No firearms. No excess sun exposure.  Diet and Exercise  Current exercise habits:  Dietary issues discussed: healthy diet   Depression Screen  Q1: Over the past two weeks, have you felt down, depressed or hopeless?no  Q2: Over the past two weeks, have you felt little interest or pleasure in doing things? no   The following portions of the patient's history were reviewed and updated as appropriate: allergies, current medications, past family history, past medical history, past social history, past surgical history and problem list.   Review of Systems  Denies visual loss; no change in chronic hearing loss.   Objective:   Vision:  Sees opthalmologist Hearing: grossly normal Body mass index:  See vs page Msk: pt easily and quickly performs "get-up-and-go" from a sitting position Cognitive Impairment Assessment: cognition, memory and judgment appear normal.  remembers 2/3 at 5 minutes (? Effort).  excellent recall.  can easily read and write a sentence.  alert and oriented x 3.  Assessment:   Medicare wellness utd on preventive parameters    Plan:   During the course of the visit the patient was educated and counseled about appropriate screening and preventive services including:        Fall prevention   Screening mammography  Bone densitometry screening  Diabetes screening  Nutrition counseling   Vaccines / LABS Zostavax / Pneumococcal Vaccine  today   Patient Instructions (the written plan) was given to the patient.   we discussed code status.  pt requests DNR

## 2014-01-19 NOTE — Patient Instructions (Addendum)
please consider these measures for your health:  minimize alcohol.  do not use tobacco products.  have a colonoscopy at least every 10 years from age 71.  Women should have an annual mammogram from age 71.  keep firearms safely stored.  always use seat belts.  have working smoke alarms in your home.  see an eye doctor and dentist regularly.  never drive under the influence of alcohol or drugs (including prescription drugs).  those with fair skin should take precautions against the sun. please let me know what your wishes would be, if artificial life support measures should become necessary.  it is critically important to prevent falling down (keep floor areas well-lit, dry, and free of loose objects.  If you have a cane, walker, or wheelchair, you should use it, even for short trips around the house.  Also, try not to rush) blood tests are being requested for you today.  We'll contact you with results. A good diet significanly improves your health.  please let me know if you wish to be referred to a dietician.  you should see an eye doctor every year.   Please let us know if you decide to get a mammogram.  This reduces your risk of dying of cancer.   You should have a vaccine against shingles (a painful rash which results from the  chickenpox infection which most people had many years ago).  This vaccine reduces, but does not totally eliminate the risk of shingles.  Because this is a medicare part d benefit, you should get it at a pharmacy.

## 2014-01-21 ENCOUNTER — Encounter: Payer: Self-pay | Admitting: Endocrinology

## 2014-01-23 ENCOUNTER — Encounter: Payer: Self-pay | Admitting: Podiatry

## 2014-01-23 ENCOUNTER — Ambulatory Visit (INDEPENDENT_AMBULATORY_CARE_PROVIDER_SITE_OTHER): Payer: Medicare Other | Admitting: Podiatry

## 2014-01-23 VITALS — BP 150/84 | HR 95 | Resp 18

## 2014-01-23 DIAGNOSIS — M79609 Pain in unspecified limb: Secondary | ICD-10-CM

## 2014-01-23 DIAGNOSIS — B351 Tinea unguium: Secondary | ICD-10-CM

## 2014-01-23 NOTE — Progress Notes (Signed)
Trim toenails.  Objective: Vital signs are stable she is alert and oriented x3. Her nails are thick yellow dystrophic onychomycotic and painful palpation.  Assessment: Pain in limb secondary to onychomycosis 1 through 5 bilateral.  Plan: Debridement of nails 1 through 5 bilateral covered service secondary to pain.

## 2014-01-26 ENCOUNTER — Other Ambulatory Visit: Payer: Self-pay

## 2014-01-26 ENCOUNTER — Other Ambulatory Visit (INDEPENDENT_AMBULATORY_CARE_PROVIDER_SITE_OTHER): Payer: Medicare Other

## 2014-01-26 DIAGNOSIS — I10 Essential (primary) hypertension: Secondary | ICD-10-CM | POA: Diagnosis not present

## 2014-01-26 LAB — URINALYSIS, ROUTINE W REFLEX MICROSCOPIC
BILIRUBIN URINE: NEGATIVE
KETONES UR: NEGATIVE
Nitrite: NEGATIVE
Specific Gravity, Urine: 1.01 (ref 1.000–1.030)
TOTAL PROTEIN, URINE-UPE24: NEGATIVE
URINE GLUCOSE: NEGATIVE
UROBILINOGEN UA: 0.2 (ref 0.0–1.0)
pH: 6 (ref 5.0–8.0)

## 2014-01-27 ENCOUNTER — Encounter: Payer: Self-pay | Admitting: Endocrinology

## 2014-02-05 ENCOUNTER — Ambulatory Visit (INDEPENDENT_AMBULATORY_CARE_PROVIDER_SITE_OTHER): Payer: Medicare Other | Admitting: Endocrinology

## 2014-02-05 ENCOUNTER — Ambulatory Visit
Admission: RE | Admit: 2014-02-05 | Discharge: 2014-02-05 | Disposition: A | Discharge status: Home / Self Care | Payer: Medicare Other | Source: Ambulatory Visit | Attending: Endocrinology | Admitting: Endocrinology

## 2014-02-05 ENCOUNTER — Encounter: Payer: Self-pay | Admitting: Endocrinology

## 2014-02-05 VITALS — BP 112/80 | HR 84 | Temp 97.5°F | Ht 63.0 in | Wt 116.0 lb

## 2014-02-05 DIAGNOSIS — S20212A Contusion of left front wall of thorax, initial encounter: Secondary | ICD-10-CM

## 2014-02-05 DIAGNOSIS — R079 Chest pain, unspecified: Secondary | ICD-10-CM | POA: Diagnosis not present

## 2014-02-05 DIAGNOSIS — S298XXA Other specified injuries of thorax, initial encounter: Secondary | ICD-10-CM | POA: Diagnosis not present

## 2014-02-05 DIAGNOSIS — S20219A Contusion of unspecified front wall of thorax, initial encounter: Secondary | ICD-10-CM | POA: Diagnosis not present

## 2014-02-05 NOTE — Patient Instructions (Addendum)
A chest-x-ray is requested for you today.  We'll contact you with results.  I hope you feel better soon.  If you don't feel better by next week, please call back.  Please call sooner if you get worse. Please try to minimize the clonazepam, as this increases your risk of falling.

## 2014-02-05 NOTE — Progress Notes (Signed)
Subjective:    Patient ID: Laura [name], female    DOB: 1941-06-09, 73 y.o.   MRN: [id number]  HPI Pt states 5 days of moderate pain at the left flank, and assoc dizziness.  Pain is much less now, but not resolved. BM does not help the pain.  She feels the pain started after she struck the area on a table.   Past Medical History  Diagnosis Date  . HYPERLIPIDEMIA 06/22/2007    rx refused  . HYPOTHYROIDISM 04/15/2009  . Anxiety state, unspecified 10/10/2007  . RESTLESS LEG SYNDROME 10/01/2008  . HYPERTENSION 05/07/2008  . 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 TRIGGER FINGER 10/30/2009  . TREMOR 11/21/2007  . Headache(784.0) 08/27/2008  . TACHYCARDIA 08/27/2008  . ASYMPTOMATIC POSTMENOPAUSAL STATUS 04/15/2009  . Deaf   . Mild mental retardation   . Dermatitis     Past Surgical History  Procedure Laterality Date  . Cataract extraction, bilateral      History   Social History  . Marital Status: Single    Spouse Name: N/A    Number of Children: N/A  . Years of Education: N/A   Occupational History  . Disabled    Social History Main Topics  . Smoking status: Never Smoker   . Smokeless tobacco: Never Used  . Alcohol Use: No  . Drug Use: No  . Sexual Activity: Not on file   Other Topics Concern  . Not on file   Social History Narrative   Lives alone   Never married   Physical activity is very good   She has never had a driver's license   Does not have a phone at home     Current Outpatient Prescriptions on File Prior to Visit  Medication Sig Dispense Refill  . busPIRone (BUSPAR) 15 MG tablet Take 1 tablet (15 mg total) by mouth 2 (two) times daily.  60 tablet  11  . clonazePAM (KLONOPIN) 1 MG tablet Take 1 tablet (1 mg total) by mouth 2 (two) times daily as needed for anxiety.  60 tablet  4  . levothyroxine (SYNTHROID, LEVOTHROID) 50 MCG tablet TAKE ONE TABLET BY MOUTH EVERY DAY . PER DR. Natane Heward IS OK TO USE BRAND OF SANDOZ.  30 tablet  6  . metoprolol tartrate  (LOPRESSOR) 25 MG tablet TAKE ONE-HALF TABLET BY MOUTH TWICE DAILY.  30 tablet  5  . Multiple Vitamin (MULTIVITAMIN) tablet Take 1 tablet by mouth daily.        Marland Kitchen triamcinolone cream (KENALOG) 0.1 % Apply 1 application topically 3 (three) times daily. As needed for itching  45 g  4  . vitamin E 100 UNIT capsule Take 100 Units by mouth 2 (two) times daily.         No current facility-administered medications on file prior to visit.   No Known Allergies  Family History  Problem Relation Age of Onset  . Thyroid disease Neg Hx    BP 112/80  Pulse 84  Temp(Src) 97.5 F (36.4 C) (Oral)  Ht 5\' 3"  (1.6 m)  Wt 116 lb (52.617 kg)  BMI 20.55 kg/m2  SpO2 96%  Review of Systems She reports constipation, but no n/v.  Denies brbpr, dysuria, and hematuria.    Objective:   Physical Exam VITAL SIGNS:  See vs page GENERAL: no distress Chest wall: moderately tender at the left anterolateral chest wall.  ABDOMEN:abdomen is soft, nontender.  no hepatosplenomegaly.  not distended.  no hernia   CXR: NAD  Assessment & Plan:  Chest-wall contusion, new Anxiety: clonazepam increases the risk of falls.

## 2014-03-13 ENCOUNTER — Telehealth: Payer: Self-pay | Admitting: Endocrinology

## 2014-03-13 NOTE — Telephone Encounter (Signed)
please call patient: There is nothing further i can do for your shakiness. Please follow the instructions of the people at the exercise class.

## 2014-03-13 NOTE — Telephone Encounter (Signed)
Pt notified via fax.

## 2014-03-15 ENCOUNTER — Other Ambulatory Visit: Payer: Self-pay | Admitting: Endocrinology

## 2014-03-21 ENCOUNTER — Other Ambulatory Visit: Payer: Self-pay

## 2014-03-21 MED ORDER — METOPROLOL TARTRATE 25 MG PO TABS: ORAL_TABLET | ORAL | Status: DC

## 2014-04-09 ENCOUNTER — Ambulatory Visit (INDEPENDENT_AMBULATORY_CARE_PROVIDER_SITE_OTHER): Payer: Medicare Other | Admitting: Endocrinology

## 2014-04-09 ENCOUNTER — Encounter: Payer: Self-pay | Admitting: Endocrinology

## 2014-04-09 VITALS — BP 120/86 | HR 77 | Temp 97.7°F | Ht 63.0 in | Wt 117.0 lb

## 2014-04-09 DIAGNOSIS — R259 Unspecified abnormal involuntary movements: Secondary | ICD-10-CM | POA: Diagnosis not present

## 2014-04-09 MED ORDER — BUSPIRONE HCL 15 MG PO TABS: 15.0000 mg | ORAL_TABLET | Freq: Two times a day (BID) | ORAL | Status: DC

## 2014-04-09 NOTE — Progress Notes (Signed)
Subjective:    Patient ID: Laura ShortsNancy M Carranza, female    DOB: 1941/07/24, 73 y.o.   MRN: [id number]  HPI Tremor: pt saw neurology, who said sxs were due to anxiety.  Neurology recommended pt not f/u there.  Pt says she gets insufficient relief from the klonopin.   Anxiety: psychiatrist declined to see patient, saying there was nothing he could do.  sxs persist.  Pt declines to try a different psychiatrist, as she doesn't believe this is the reason for her tremor.   Past Medical History  Diagnosis Date  . HYPERLIPIDEMIA 06/22/2007    rx refused  . HYPOTHYROIDISM 04/15/2009  . Anxiety state, unspecified 10/10/2007  . RESTLESS LEG SYNDROME 10/01/2008  . HYPERTENSION 05/07/2008  . 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 TRIGGER FINGER 10/30/2009  . TREMOR 11/21/2007  . Headache(784.0) 08/27/2008  . TACHYCARDIA 08/27/2008  . ASYMPTOMATIC POSTMENOPAUSAL STATUS 04/15/2009  . Deaf   . Mild mental retardation   . Dermatitis     Past Surgical History  Procedure Laterality Date  . Cataract extraction, bilateral      History   Social History  . Marital Status: Single    Spouse Name: N/A    Number of Children: N/A  . Years of Education: N/A   Occupational History  . Disabled    Social History Main Topics  . Smoking status: Never Smoker   . Smokeless tobacco: Never Used  . Alcohol Use: No  . Drug Use: No  . Sexual Activity: Not on file   Other Topics Concern  . Not on file   Social History Narrative   Lives alone   Never married   Physical activity is very good   She has never had a driver's license   Does not have a phone at home     Current Outpatient Prescriptions on File Prior to Visit  Medication Sig Dispense Refill  . clonazePAM (KLONOPIN) 1 MG tablet Take 1 tablet (1 mg total) by mouth 2 (two) times daily as needed for anxiety.  60 tablet  4  . levothyroxine (SYNTHROID, LEVOTHROID) 50 MCG tablet TAKE ONE TABLET BY MOUTH ONCE DAILY  30 tablet  0  . metoprolol tartrate  (LOPRESSOR) 25 MG tablet TAKE ONE-HALF TABLET BY MOUTH TWICE DAILY.  30 tablet  2  . Multiple Vitamin (MULTIVITAMIN) tablet Take 1 tablet by mouth daily.        Marland Kitchen. triamcinolone cream (KENALOG) 0.1 % Apply 1 application topically 3 (three) times daily. As needed for itching  45 g  4  . vitamin E 100 UNIT capsule Take 100 Units by mouth 2 (two) times daily.         No current facility-administered medications on file prior to visit.    No Known Allergies  Family History  Problem Relation Age of Onset  . Thyroid disease Neg Hx     BP 120/86  Pulse 77  Temp(Src) 97.7 F (36.5 C) (Oral)  Ht 5\' 3"  (1.6 m)  Wt 117 lb (53.071 kg)  BMI 20.73 kg/m2  SpO2 97%  Review of Systems Pt says she has drowsiness from the clonazepam.  She denies insomnia.      Objective:   Physical Exam VITAL SIGNS:  See vs page GENERAL: no distress Neuro: moderate fine tremor of the hands.    Lab Results  Component Value Date   TSH 2.01 01/19/2014      Assessment & Plan:  Anxiety: not well-controlled.  She declines ref  psychiatry Tremor: not well-controlled  Patient Instructions  Please understand that i cannot do better for your tremor than 2 different neurologists (one of whom is a tremor specialist).

## 2014-04-09 NOTE — Patient Instructions (Signed)
Please understand that i cannot do better for your tremor than 2 different neurologists (one of whom is a tremor specialist).

## 2014-04-12 ENCOUNTER — Other Ambulatory Visit: Payer: Self-pay

## 2014-04-12 MED ORDER — LEVOTHYROXINE SODIUM 50 MCG PO TABS: ORAL_TABLET | ORAL | Status: DC

## 2014-05-08 ENCOUNTER — Encounter: Payer: Self-pay | Admitting: Podiatry

## 2014-05-08 ENCOUNTER — Ambulatory Visit (INDEPENDENT_AMBULATORY_CARE_PROVIDER_SITE_OTHER): Payer: Medicare Other | Admitting: Podiatry

## 2014-05-08 VITALS — BP 154/90 | HR 84 | Resp 12

## 2014-05-08 DIAGNOSIS — M79609 Pain in unspecified limb: Secondary | ICD-10-CM

## 2014-05-08 DIAGNOSIS — B351 Tinea unguium: Secondary | ICD-10-CM | POA: Diagnosis not present

## 2014-05-08 NOTE — Progress Notes (Signed)
She presents today with a chief complaint of painful elongated toenails one through 5 bilateral.  Objective: Pulses are strongly palpable today nails are thick yellow dystrophic with mycotic and sharply incurvated.  Assessment: Pain in limb secondary to onychomycosis 1 through 5 bilateral.  Plan: Debridement of nails 1 through 5 bilateral secondary to pain. Followup with me in 3 months.

## 2014-05-15 DIAGNOSIS — H35039 Hypertensive retinopathy, unspecified eye: Secondary | ICD-10-CM | POA: Diagnosis not present

## 2014-05-15 DIAGNOSIS — H52229 Regular astigmatism, unspecified eye: Secondary | ICD-10-CM | POA: Diagnosis not present

## 2014-05-15 DIAGNOSIS — Q142 Congenital malformation of optic disc: Secondary | ICD-10-CM | POA: Diagnosis not present

## 2014-05-15 DIAGNOSIS — H5200 Hypermetropia, unspecified eye: Secondary | ICD-10-CM | POA: Diagnosis not present

## 2014-05-31 ENCOUNTER — Other Ambulatory Visit: Payer: Self-pay

## 2014-06-01 ENCOUNTER — Telehealth: Payer: Self-pay

## 2014-06-01 MED ORDER — CLONAZEPAM 1 MG PO TABS: 1.0000 mg | ORAL_TABLET | Freq: Two times a day (BID) | ORAL | Status: DC | PRN

## 2014-06-01 NOTE — Telephone Encounter (Signed)
Rx faxed to pharmacy  

## 2014-06-01 NOTE — Telephone Encounter (Signed)
i printed 

## 2014-06-01 NOTE — Telephone Encounter (Signed)
Received a refill request for Clonazepam 1 mg. Pt was last seen on 04/09/2014 and med was last refilled on 12/29/2013.  Thanks!

## 2014-06-13 ENCOUNTER — Other Ambulatory Visit: Payer: Self-pay

## 2014-06-13 MED ORDER — LEVOTHYROXINE SODIUM 50 MCG PO TABS: ORAL_TABLET | ORAL | Status: DC

## 2014-06-13 MED ORDER — METOPROLOL TARTRATE 25 MG PO TABS: ORAL_TABLET | ORAL | Status: DC

## 2014-08-14 ENCOUNTER — Ambulatory Visit (INDEPENDENT_AMBULATORY_CARE_PROVIDER_SITE_OTHER): Payer: Medicare Other | Admitting: Podiatry

## 2014-08-14 ENCOUNTER — Encounter: Payer: Self-pay | Admitting: Podiatry

## 2014-08-14 DIAGNOSIS — M79609 Pain in unspecified limb: Secondary | ICD-10-CM | POA: Diagnosis not present

## 2014-08-14 DIAGNOSIS — B351 Tinea unguium: Secondary | ICD-10-CM

## 2014-08-14 DIAGNOSIS — M79673 Pain in unspecified foot: Secondary | ICD-10-CM

## 2014-08-14 NOTE — Progress Notes (Signed)
   Subjective:    Patient ID: Laura [name], female    DOB: [birth year], 73 y.o.   MRN: [id number]  HPI Comments: Presents today chief complaint of painful elongated toenails.  Objective: Pulses are palpable bilateral nails are thick, yellow dystrophic onychomycosis and painful palpation.   Assessment: Onychomycosis with pain in limb.  Plan: Treatment of nails in thickness and length as covered service secondary to pain.      Review of Systems     Objective:   Physical Exam        Assessment & Plan:

## 2014-09-05 ENCOUNTER — Ambulatory Visit (INDEPENDENT_AMBULATORY_CARE_PROVIDER_SITE_OTHER): Payer: Medicare Other | Admitting: Endocrinology

## 2014-09-05 ENCOUNTER — Encounter: Payer: Self-pay | Admitting: Endocrinology

## 2014-09-05 VITALS — BP 130/84 | HR 82 | Temp 97.7°F | Ht 63.0 in | Wt 120.0 lb

## 2014-09-05 DIAGNOSIS — R259 Unspecified abnormal involuntary movements: Secondary | ICD-10-CM

## 2014-09-05 MED ORDER — ALPRAZOLAM 0.25 MG PO TABS: 0.2500 mg | ORAL_TABLET | Freq: Two times a day (BID) | ORAL | Status: DC | PRN

## 2014-09-05 NOTE — Progress Notes (Signed)
Subjective:    Patient ID: Laura [name], female    DOB: 02-Apr-1941, 73 y.o.   MRN: [id number]  HPI Tremor: pt saw neurology, who said sxs were due to anxiety.  Neurology recommended pt not f/u there.  Pt says she gets insufficient relief from the klonopin.  Pt says she wants to d/c the klonopin, and instead take a different med.  Anxiety: psychiatrist declined to see patient, saying there was nothing he could do.  sxs persist.  Pt declines to try a different psychiatrist, as she doesn't believe this is the reason for her tremor.   She has slight dryness in the mouth, and assoc intermittent drowsiness.   Past Medical History  Diagnosis Date  . HYPERLIPIDEMIA 06/22/2007    rx refused  . HYPOTHYROIDISM 04/15/2009  . Anxiety state, unspecified 10/10/2007  . RESTLESS LEG SYNDROME 10/01/2008  . HYPERTENSION 05/07/2008  . 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 TRIGGER FINGER 10/30/2009  . TREMOR 11/21/2007  . Headache(784.0) 08/27/2008  . TACHYCARDIA 08/27/2008  . ASYMPTOMATIC POSTMENOPAUSAL STATUS 04/15/2009  . Deaf   . Mild mental retardation   . Dermatitis     Past Surgical History  Procedure Laterality Date  . Cataract extraction, bilateral      History   Social History  . Marital Status: Single    Spouse Name: N/A    Number of Children: N/A  . Years of Education: N/A   Occupational History  . Disabled    Social History Main Topics  . Smoking status: Never Smoker   . Smokeless tobacco: Never Used  . Alcohol Use: No  . Drug Use: No  . Sexual Activity: Not on file   Other Topics Concern  . Not on file   Social History Narrative   Lives alone   Never married   Physical activity is very good   She has never had a driver's license   Does not have a phone at home     Current Outpatient Prescriptions on File Prior to Visit  Medication Sig Dispense Refill  . levothyroxine (SYNTHROID, LEVOTHROID) 50 MCG tablet TAKE ONE TABLET BY MOUTH ONCE DAILY  30 tablet  2  .  metoprolol tartrate (LOPRESSOR) 25 MG tablet TAKE ONE-HALF TABLET BY MOUTH TWICE DAILY.  30 tablet  2  . Multiple Vitamin (MULTIVITAMIN) tablet Take 1 tablet by mouth daily.        Marland Kitchen. triamcinolone cream (KENALOG) 0.1 % Apply 1 application topically 3 (three) times daily. As needed for itching  45 g  4  . vitamin E 100 UNIT capsule Take 100 Units by mouth 2 (two) times daily.         No current facility-administered medications on file prior to visit.    No Known Allergies  Family History  Problem Relation Age of Onset  . Thyroid disease Neg Hx     BP 130/84  Pulse 82  Temp(Src) 97.7 F (36.5 C) (Oral)  Ht 5\' 3"  (1.6 m)  Wt 120 lb (54.432 kg)  BMI 21.26 kg/m2  SpO2 97%  Review of Systems Tremor is unchanged.  Denies excessive diaphoresis.      Objective:   Physical Exam VITAL SIGNS:  See vs page.   GENERAL: no distress. Skin: not diaphoretic.   Neuro: slight coarse tremor of all 4's.          Assessment & Plan:  Anxiety: not well-controlled Tremor: due to the anxiety, also not well-controlled Dry mouth, new. uncertain etiology.  i told pt i don't know what to do for this.    Patient is advised the following: Patient Instructions  Here is a prescription for the shaking, on a trial basis. If it works, please let us know, so we can refill it.

## 2014-09-05 NOTE — Patient Instructions (Addendum)
Here is a prescription for the shaking, on a trial basis. If it works, please let us know, so we can refill it.

## 2014-09-07 ENCOUNTER — Encounter: Payer: Self-pay | Admitting: Endocrinology

## 2014-09-10 ENCOUNTER — Telehealth: Payer: Self-pay | Admitting: Endocrinology

## 2014-09-10 NOTE — Telephone Encounter (Signed)
Pt calling regarding the fax she sent this AM regarding alprozaolone? not working. It makes her leg worse it has not gotten better. She is feeling shaky and dizzy, legs have been hurting with cramping.   Pt thinks the clonozapam will be better for her.  Please advise you suggest.  Fax # [phone]

## 2014-09-10 NOTE — Telephone Encounter (Signed)
Pt advised via fax.

## 2014-09-10 NOTE — Telephone Encounter (Signed)
See below and please advise, Thanks!  

## 2014-09-10 NOTE — Telephone Encounter (Signed)
Ok, please go back to the clonazepam

## 2014-09-11 ENCOUNTER — Telehealth: Payer: Self-pay

## 2014-09-11 MED ORDER — LEVOTHYROXINE SODIUM 50 MCG PO TABS: ORAL_TABLET | ORAL | Status: DC

## 2014-09-11 MED ORDER — METOPROLOL TARTRATE 25 MG PO TABS: ORAL_TABLET | ORAL | Status: DC

## 2014-09-11 NOTE — Telephone Encounter (Signed)
You have refills on the clonazepam.  Please claim a refill.

## 2014-09-11 NOTE — Telephone Encounter (Signed)
Contacted pharmacy to check and see if the pt has refills on the clonazepam. Pt does have refill, but on 10/8 pt claimed a refill for her clonazepam. Pt will not be able to pick up another rx that her insurance will pay until 11/8. Pt will be notified.

## 2014-09-11 NOTE — Telephone Encounter (Signed)
Patient need refill of Metoprolol 25 mg Levothyroxine 50 mg

## 2014-09-11 NOTE — Telephone Encounter (Signed)
Sorry, we can't do early

## 2014-09-11 NOTE — Telephone Encounter (Signed)
Contacted pt and advised that she could go back on the clonazepam. Pt faxed back stating that she has thrown all of her Clonazepam in her toilet at home and is currently out of her medication. Pt states that she will need a new rx.  Please advise, Thanks!

## 2014-09-12 ENCOUNTER — Telehealth: Payer: Self-pay

## 2014-09-12 ENCOUNTER — Telehealth: Payer: Self-pay | Admitting: Endocrinology

## 2014-09-12 NOTE — Telephone Encounter (Signed)
Pt advised that Clonazepam could not be refilled. Pt is going to stop the Xanax. Pt states that she has been having more shakiness in her legs and she has had constant headache. Pt voiced understanding and states she will stay off the Xanax until she can get her Clonazepam refilled.

## 2014-09-12 NOTE — Telephone Encounter (Signed)
Pt advised that rx would not be able to be refilled.

## 2014-09-12 NOTE — Telephone Encounter (Signed)
Pt advised via fax that Clonazepam will not be able and be refilled until 11/8.

## 2014-09-12 NOTE — Telephone Encounter (Signed)
Rapid heart beat after lunch, she is concern it usually happens every morning or afternoon, metropolo it does well in the morning afternoon is when she experience this she gets really dizzy she thinks she need to be on another medication, clonziapam is what she thinks she may need Hearing impaired

## 2014-09-14 ENCOUNTER — Telehealth: Payer: Self-pay | Admitting: Endocrinology

## 2014-09-14 NOTE — Telephone Encounter (Signed)
See below, pt has been advised that clonazepam cannot be refilled until 11/8. Not sure how to proceed. Thanks!

## 2014-09-14 NOTE — Telephone Encounter (Signed)
Return call to speak to you Meagan

## 2014-09-14 NOTE — Telephone Encounter (Signed)
Please take the alprazolam as needed, until then

## 2014-09-14 NOTE — Telephone Encounter (Signed)
Spoke with pt and advised that her Clonazepam Rx will be refilled on 11/8.

## 2014-09-14 NOTE — Telephone Encounter (Signed)
Pt advised.

## 2014-09-14 NOTE — Telephone Encounter (Signed)
Dizzy, eyes are twitchy, throat is dry, body is shaky please advise asap

## 2014-09-17 ENCOUNTER — Telehealth: Payer: Self-pay | Admitting: Endocrinology

## 2014-09-17 NOTE — Telephone Encounter (Signed)
Patient need refill of Clonazepam

## 2014-09-17 NOTE — Telephone Encounter (Signed)
Fax sent to pt advised that rx will not be refilled until 11/8.

## 2014-09-24 ENCOUNTER — Telehealth: Payer: Self-pay | Admitting: Endocrinology

## 2014-09-24 DIAGNOSIS — Z23 Encounter for immunization: Secondary | ICD-10-CM | POA: Diagnosis not present

## 2014-09-24 NOTE — Telephone Encounter (Signed)
Patient stated that her leg are still shaky, she need a refill of Clonazepam sent to her pharmacy.

## 2014-09-24 NOTE — Telephone Encounter (Signed)
Pt advised that rx will be refilled on 09/30/2014.

## 2014-09-26 ENCOUNTER — Telehealth: Payer: Self-pay | Admitting: Endocrinology

## 2014-09-26 NOTE — Telephone Encounter (Signed)
See below and please advise, Thanks!  

## 2014-09-26 NOTE — Telephone Encounter (Signed)
Pt advised via fax. Waiting on response.

## 2014-09-26 NOTE — Telephone Encounter (Signed)
There are many possible causes for this symptom, and I am always happy to check this in the office. Please resume the medication when you can obtain it.

## 2014-09-26 NOTE — Telephone Encounter (Signed)
Patient would like to know if Dr. Everardo AllEllison can send something to her pharmacy for her dizziness  Please advise    Thank you

## 2014-10-02 ENCOUNTER — Other Ambulatory Visit: Payer: Self-pay | Admitting: Endocrinology

## 2014-10-02 ENCOUNTER — Telehealth: Payer: Self-pay | Admitting: Endocrinology

## 2014-10-02 MED ORDER — CLONAZEPAM 1 MG PO TABS: 1.0000 mg | ORAL_TABLET | Freq: Two times a day (BID) | ORAL | Status: DC | PRN

## 2014-10-02 NOTE — Telephone Encounter (Signed)
See below and please advise, Thanks!  

## 2014-10-02 NOTE — Telephone Encounter (Signed)
Rx faxed to pharmacy. Pt notified.

## 2014-10-02 NOTE — Telephone Encounter (Signed)
Patient would like to have her Clonzepam sent to her pharmacy   [facility]    Thank you

## 2014-10-02 NOTE — Telephone Encounter (Signed)
Ok, i printed 

## 2014-10-10 ENCOUNTER — Telehealth: Payer: Self-pay | Admitting: Endocrinology

## 2014-10-10 MED ORDER — METOPROLOL TARTRATE 25 MG PO TABS: ORAL_TABLET | ORAL | Status: DC

## 2014-10-10 MED ORDER — LEVOTHYROXINE SODIUM 50 MCG PO TABS: ORAL_TABLET | ORAL | Status: DC

## 2014-10-10 NOTE — Telephone Encounter (Signed)
Rx sent per pt's request.  

## 2014-10-10 NOTE — Telephone Encounter (Signed)
Pt needs Metoprolol 25 mg and levothyroxine called into [facility] pharmacy

## 2014-10-30 ENCOUNTER — Telehealth: Payer: Self-pay | Admitting: Endocrinology

## 2014-10-30 NOTE — Telephone Encounter (Signed)
Patient would like a refill on her clonzepam  Also she would like to know if she can take caltrate [address]  [facility] [address]    Please advise patient

## 2014-10-30 NOTE — Telephone Encounter (Signed)
Pt advised to contact [facility] for refill on clonazepam she had 4 refills left. Please advise if ok to take caltrate. Thanks!

## 2014-10-30 NOTE — Telephone Encounter (Signed)
Pt advised.

## 2014-10-30 NOTE — Telephone Encounter (Signed)
caltrate is fine

## 2014-11-01 ENCOUNTER — Telehealth: Payer: Self-pay | Admitting: Endocrinology

## 2014-11-01 NOTE — Telephone Encounter (Signed)
Contacted [facility]. They states the had 6 refills for the pt on file. Advised pharmacy to refill medication and to contact the pt when the medication is ready for pick up.

## 2014-11-01 NOTE — Telephone Encounter (Signed)
[facility],could you please resend.

## 2014-11-13 ENCOUNTER — Encounter: Payer: Self-pay | Admitting: Podiatry

## 2014-11-13 ENCOUNTER — Ambulatory Visit (INDEPENDENT_AMBULATORY_CARE_PROVIDER_SITE_OTHER): Payer: Medicare Other | Admitting: Podiatry

## 2014-11-13 DIAGNOSIS — M79673 Pain in unspecified foot: Secondary | ICD-10-CM

## 2014-11-13 DIAGNOSIS — B351 Tinea unguium: Secondary | ICD-10-CM | POA: Diagnosis not present

## 2014-11-13 NOTE — Progress Notes (Signed)
She presents today with a chief complaint of painful elongated toenails one through 5 bilateral.  Objective: Pulses are strongly palpable today nails are thick yellow dystrophic with mycotic and sharply incurvated.  Assessment: Pain in limb secondary to onychomycosis 1 through 5 bilateral.  Plan: Debridement of nails 1 through 5 bilateral secondary to pain. Followup with me in 3 months. 

## 2015-01-08 ENCOUNTER — Other Ambulatory Visit: Payer: Self-pay | Admitting: Endocrinology

## 2015-01-23 ENCOUNTER — Telehealth: Payer: Self-pay | Admitting: Endocrinology

## 2015-01-23 NOTE — Telephone Encounter (Signed)
Patient states that she is having shaking legs at night She is taking her medication as directed; the shaking is keeping her up    Please advise patient    Thank you

## 2015-01-24 NOTE — Telephone Encounter (Signed)
See note below and please advise, Thanks! 

## 2015-01-24 NOTE — Telephone Encounter (Signed)
please message patient: Please understand i've done all i can do for this.  i hope you feel better soon.

## 2015-01-25 NOTE — Telephone Encounter (Signed)
Unable to reach pt. Number listed has been disconnected. Front staff advised to notify me when pt calls back.

## 2015-02-04 ENCOUNTER — Telehealth: Payer: Self-pay | Admitting: *Deleted

## 2015-02-04 NOTE — Telephone Encounter (Signed)
[facility] to [facility]

## 2015-02-06 ENCOUNTER — Telehealth: Payer: Self-pay | Admitting: Endocrinology

## 2015-02-06 ENCOUNTER — Other Ambulatory Visit: Payer: Self-pay | Admitting: Endocrinology

## 2015-02-06 NOTE — Telephone Encounter (Signed)
Need refill on levothyroxin call in for pick up for tomorrow

## 2015-02-06 NOTE — Telephone Encounter (Signed)
Message below added to chart in error.

## 2015-02-12 ENCOUNTER — Encounter: Payer: Self-pay | Admitting: Podiatry

## 2015-02-12 ENCOUNTER — Ambulatory Visit: Payer: Medicare Other | Admitting: Podiatry

## 2015-02-12 ENCOUNTER — Ambulatory Visit (INDEPENDENT_AMBULATORY_CARE_PROVIDER_SITE_OTHER): Payer: Medicare Other | Admitting: Podiatry

## 2015-02-12 DIAGNOSIS — M79673 Pain in unspecified foot: Secondary | ICD-10-CM

## 2015-02-12 DIAGNOSIS — B351 Tinea unguium: Secondary | ICD-10-CM

## 2015-02-12 NOTE — Progress Notes (Signed)
She presents today with a chief complaint of painful elongated toenails one through 5 bilateral.  Objective: Pulses are strongly palpable today nails are thick yellow dystrophic with mycotic and sharply incurvated.  Assessment: Pain in limb secondary to onychomycosis 1 through 5 bilateral.  Plan: Debridement of nails 1 through 5 bilateral secondary to pain. Followup with me in 3 months. 

## 2015-02-14 ENCOUNTER — Ambulatory Visit: Payer: Medicare Other

## 2015-02-25 DIAGNOSIS — M1811 Unilateral primary osteoarthritis of first carpometacarpal joint, right hand: Secondary | ICD-10-CM | POA: Diagnosis not present

## 2015-02-25 DIAGNOSIS — M1812 Unilateral primary osteoarthritis of first carpometacarpal joint, left hand: Secondary | ICD-10-CM | POA: Diagnosis not present

## 2015-02-26 ENCOUNTER — Telehealth: Payer: Self-pay | Admitting: Endocrinology

## 2015-02-26 ENCOUNTER — Encounter: Payer: Self-pay | Admitting: Endocrinology

## 2015-02-26 ENCOUNTER — Ambulatory Visit (INDEPENDENT_AMBULATORY_CARE_PROVIDER_SITE_OTHER): Payer: Medicare Other | Admitting: Endocrinology

## 2015-02-26 VITALS — BP 120/82 | HR 92 | Temp 98.3°F | Ht 63.0 in | Wt 118.0 lb

## 2015-02-26 DIAGNOSIS — I10 Essential (primary) hypertension: Secondary | ICD-10-CM

## 2015-02-26 DIAGNOSIS — E039 Hypothyroidism, unspecified: Secondary | ICD-10-CM

## 2015-02-26 DIAGNOSIS — Z0000 Encounter for general adult medical examination without abnormal findings: Secondary | ICD-10-CM | POA: Diagnosis not present

## 2015-02-26 DIAGNOSIS — N951 Menopausal and female climacteric states: Secondary | ICD-10-CM

## 2015-02-26 DIAGNOSIS — E785 Hyperlipidemia, unspecified: Secondary | ICD-10-CM | POA: Diagnosis not present

## 2015-02-26 NOTE — Progress Notes (Signed)
we discussed code status.  pt requests full code, but would not want to be started or maintained on artificial life-support measures if there was not a reasonable chance of recovery 

## 2015-02-26 NOTE — Patient Instructions (Signed)
blood tests are requested for you today.  We'll let you know about the results.  please consider these measures for your health:  minimize alcohol.  do not use tobacco products.  have a colonoscopy at least every 10 years from age 72.  Women should have an annual mammogram from age 72.  keep firearms safely stored.  always use seat belts.  have working smoke alarms in your home.  see an eye doctor and dentist regularly.  never drive under the influence of alcohol or drugs (including prescription drugs).  those with fair skin should take precautions against the sun.  it is critically important to prevent falling down (keep floor areas well-lit, dry, and free of loose objects.  If you have a cane, walker, or wheelchair, you should use it, even for short trips around the house.  Also, try not to rush).   good diet and exercise significantly improve your health.  please let me know if you wish to be referred to a dietician.  high blood sugar is very risky to your health.  you should see an eye doctor and dentist every year.  It is very important to get all recommended vaccinations.  Please come back for a follow-up appointment in 6 months.

## 2015-02-26 NOTE — Progress Notes (Signed)
Subjective:    Patient ID: Laura [name], female    DOB: Jan 15, 1941, 74 y.o.   MRN: [id number]  HPI The state of at least three ongoing medical problems is addressed today, with interval history of each noted here: HTN: pt denies sob Dyslipidemia: she denies chest pain Tremor: pt says is unchanged Hypothyroidism: denies weight change Past Medical History  Diagnosis Date  . HYPERLIPIDEMIA 06/22/2007    rx refused  . HYPOTHYROIDISM 04/15/2009  . Anxiety state, unspecified 10/10/2007  . RESTLESS LEG SYNDROME 10/01/2008  . HYPERTENSION 05/07/2008  . 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 TRIGGER FINGER 10/30/2009  . TREMOR 11/21/2007  . Headache(784.0) 08/27/2008  . TACHYCARDIA 08/27/2008  . ASYMPTOMATIC POSTMENOPAUSAL STATUS 04/15/2009  . Deaf   . Mild mental retardation   . Dermatitis     Past Surgical History  Procedure Laterality Date  . Cataract extraction, bilateral      History   Social History  . Marital Status: Single    Spouse Name: N/A  . Number of Children: N/A  . Years of Education: N/A   Occupational History  . Disabled    Social History Main Topics  . Smoking status: Never Smoker   . Smokeless tobacco: Never Used  . Alcohol Use: No  . Drug Use: No  . Sexual Activity: Not on file   Other Topics Concern  . Not on file   Social History Narrative   Lives alone   Never married   Physical activity is very good   She has never had a driver's license   Does not have a phone at home     Current Outpatient Prescriptions on File Prior to Visit  Medication Sig Dispense Refill  . clonazePAM (KLONOPIN) 1 MG tablet Take 1 tablet (1 mg total) by mouth 2 (two) times daily as needed for anxiety. (Patient taking differently: Take 1 mg by mouth 2 (two) times daily as needed for anxiety. Pt states that medication is not working.) 60 tablet 5  . levothyroxine (SYNTHROID, LEVOTHROID) 50 MCG tablet TAKE ONE TABLET BY MOUTH ONCE DAILY 30 tablet 4  . metoprolol  tartrate (LOPRESSOR) 25 MG tablet TAKE ONE-HALF TABLET BY MOUTH TWICE DAILY. 30 tablet 2  . Multiple Vitamin (MULTIVITAMIN) tablet Take 1 tablet by mouth daily.      Marland Kitchen triamcinolone cream (KENALOG) 0.1 % APPLY  CREAM EXTERNALLY THREE TIMES DAILY AS NEEDED FOR  ITCHING 45 g 2  . vitamin E 100 UNIT capsule Take 100 Units by mouth 2 (two) times daily.       No current facility-administered medications on file prior to visit.    No Known Allergies  Family History  Problem Relation Age of Onset  . Thyroid disease Neg Hx     BP 120/82 mmHg  Pulse 92  Temp(Src) 98.3 F (36.8 C) (Oral)  Ht  (1.6 m)  Wt 118 lb (53.524 kg)  BMI 20.91 kg/m2  SpO2 95%   Review of Systems Denies edema and anxiety    Objective:   Physical Exam VITAL SIGNS:  See vs page GENERAL: no distress NECK: There is no palpable thyroid enlargement.  No thyroid nodule is palpable.  No palpable lymphadenopathy at the anterior neck. LUNGS:  Clear to auscultation HEART:  Regular rate and rhythm without murmurs noted. Normal S1,S2.   Ext; no edema Neuro: slight tremor of the hands   i personally reviewed electrocardiogram tracing: no signif change since 2014.     Assessment &  Plan:  Hypothyroidism: on rx HTN: well-controlled.  Same medication. Tremor: this is the best control this pt should aim for, given her h/o intolerance of meds, and the fact that she has seen multiple specialists for this. Dyslipidemia: she is due for recheck   blood tests are requested for you today.  We'll let you know about the results.    Subjective:   Patient here for Medicare annual wellness visit and management of other chronic and acute problems.     Risk factors: advanced age    Roster of Physicians Providing Medical Care to Patient:  See "snapshot"   Activities of Daily Living: In your present state of health, do you have any difficulty performing the following activities?:  Preparing food and eating?: No  Bathing  yourself: No  Getting dressed: No  Using the toilet:No  Moving around from place to place: No  In the past year have you fallen or had a near fall?:No    Home Safety: Has smoke detector and wears seat belts. No firearms. No excess sun exposure.  Diet and Exercise  Current exercise habits: pt says she walks frequently Dietary issues discussed: pt reports a healthy diet   Depression Screen  Q1: Over the past two weeks, have you felt down, depressed or hopeless?no  Q2: Over the past two weeks, have you felt little interest or pleasure in doing things? no   The following portions of the patient's history were reviewed and updated as appropriate: allergies, current medications, past family history, past medical history, past social history, past surgical history and problem list.   Review of Systems  Denies visual loss (no change in chronic severe hearing loss) Objective:   Vision:  Sees opthalmologist Hearing: grossly normal Body mass index:  See vs page Msk: pt easily and quickly performs "get-up-and-go" from a sitting position Cognitive Impairment Assessment: cognition, memory and judgment appear normal.  remembers 3/3 at 5 minutes.  excellent recall.  can easily read and write a sentence.  alert and oriented x 3   Assessment:   Medicare wellness utd on preventive parameters    Plan:   During the course of the visit the patient was educated and counseled about appropriate screening and preventive services including:       Fall prevention   Screening mammography  Bone densitometry screening  Diabetes screening  Nutrition counseling   Vaccines / LABS Zostavax / Pneumococcal Vaccine  today   Patient Instructions (the written plan) was given to the patient.

## 2015-02-26 NOTE — Telephone Encounter (Signed)
Pt called concerned on  dosage on medications are wrong and doesn't know  wht to take call pt back at [phone] this is a video phone pt does sign language

## 2015-02-27 ENCOUNTER — Other Ambulatory Visit: Payer: Self-pay

## 2015-02-27 NOTE — Telephone Encounter (Signed)
This message was entered no records are entered for this pt.

## 2015-03-04 ENCOUNTER — Ambulatory Visit: Payer: Medicare Other | Admitting: Endocrinology

## 2015-03-05 ENCOUNTER — Other Ambulatory Visit: Payer: Self-pay | Admitting: Endocrinology

## 2015-03-14 ENCOUNTER — Inpatient Hospital Stay: Admission: RE | Admit: 2015-03-14 | Payer: Medicare Other | Source: Ambulatory Visit

## 2015-03-26 DIAGNOSIS — M1812 Unilateral primary osteoarthritis of first carpometacarpal joint, left hand: Secondary | ICD-10-CM | POA: Diagnosis not present

## 2015-03-26 DIAGNOSIS — M1811 Unilateral primary osteoarthritis of first carpometacarpal joint, right hand: Secondary | ICD-10-CM | POA: Diagnosis not present

## 2015-04-08 ENCOUNTER — Telehealth: Payer: Self-pay | Admitting: Endocrinology

## 2015-04-08 MED ORDER — METOPROLOL TARTRATE 25 MG PO TABS: ORAL_TABLET | ORAL | Status: DC

## 2015-04-08 NOTE — Telephone Encounter (Signed)
Pt needs refills on her BP med and clonazapam please

## 2015-04-08 NOTE — Telephone Encounter (Signed)
Unable to contacted the patient at this time. Will try again at a later time.

## 2015-04-08 NOTE — Telephone Encounter (Signed)
Patient called requesting her Clonazepam be changed to a alternative medication. Patient stated her legs are not benefiting from the medication. Patient stated the medication is not helping her sleep and her legs are jumpy all night. Please advise, Thanks!

## 2015-04-08 NOTE — Telephone Encounter (Signed)
Please refill metoprolol. please call patient: we have tried multiple times--we can't improve on the clonazepam.

## 2015-04-08 NOTE — Telephone Encounter (Signed)
Pt returning your call informed pt about metoprolol rx but will need call back regarding clonazepam

## 2015-04-09 MED ORDER — CLONAZEPAM 1 MG PO TABS: 1.0000 mg | ORAL_TABLET | Freq: Two times a day (BID) | ORAL | Status: DC | PRN

## 2015-04-09 NOTE — Telephone Encounter (Signed)
i printed 

## 2015-04-09 NOTE — Telephone Encounter (Signed)
Rx faxed to [facility]

## 2015-04-09 NOTE — Addendum Note (Signed)
Addended by: Romero BellingELLISON, Zanaiya Calabria on: 04/09/2015 10:26 AM   Modules accepted: Orders

## 2015-04-09 NOTE — Telephone Encounter (Signed)
I contacted the patient and advised the Clonazepam could not be increased and that a alternative medication could not be sent. I explained adjustments had been tried in the past and we could not try anything new. Patient stated since no other medicine could be sent she would like a refill on her Clonazepam.  Please advise if ok to refill medication. Thanks!

## 2015-04-23 ENCOUNTER — Telehealth: Payer: Self-pay | Admitting: Endocrinology

## 2015-04-23 NOTE — Telephone Encounter (Signed)
Unable to reach the patient. I will try at a later time.

## 2015-04-23 NOTE — Telephone Encounter (Signed)
Please try cutting the pill in 1/2.

## 2015-04-23 NOTE — Telephone Encounter (Signed)
Patient need a refill clonazepam 225 mg, she would like to if she could have a lower dosage because it is making her sick, please advise

## 2015-04-23 NOTE — Telephone Encounter (Signed)
See note below and please advise if ok to reduce clonazepam. Thanks!

## 2015-04-24 ENCOUNTER — Other Ambulatory Visit: Payer: Self-pay

## 2015-04-24 NOTE — Telephone Encounter (Signed)
I contacted the patient and advised Dr. Everardo AllEllison said she could try taking 1/2 of the medication. Patient stated she would try this and let us know if this helps.

## 2015-04-24 NOTE — Telephone Encounter (Signed)
Tried to contact the patient via sign language interpreter, unable to do so at this time. Will try again at a later time.

## 2015-04-30 ENCOUNTER — Telehealth: Payer: Self-pay | Admitting: Endocrinology

## 2015-04-30 NOTE — Telephone Encounter (Signed)
See below to be advised.  

## 2015-04-30 NOTE — Telephone Encounter (Signed)
Pt states she has gotten much better with the change in one full tab in AM and PM on clonazapam

## 2015-05-07 DIAGNOSIS — M1812 Unilateral primary osteoarthritis of first carpometacarpal joint, left hand: Secondary | ICD-10-CM | POA: Diagnosis not present

## 2015-05-07 DIAGNOSIS — M1811 Unilateral primary osteoarthritis of first carpometacarpal joint, right hand: Secondary | ICD-10-CM | POA: Diagnosis not present

## 2015-05-23 ENCOUNTER — Ambulatory Visit (INDEPENDENT_AMBULATORY_CARE_PROVIDER_SITE_OTHER): Payer: Medicare Other | Admitting: Podiatry

## 2015-05-23 ENCOUNTER — Encounter: Payer: Self-pay | Admitting: Podiatry

## 2015-05-23 VITALS — BP 147/84 | HR 79 | Resp 18

## 2015-05-23 DIAGNOSIS — M79673 Pain in unspecified foot: Secondary | ICD-10-CM

## 2015-05-23 DIAGNOSIS — B351 Tinea unguium: Secondary | ICD-10-CM

## 2015-05-23 NOTE — Progress Notes (Signed)
Patient ID: Laura [name], female   DOB: May 20, 1941, 74 y.o.   MRN: [id number] rComplaint:  Visit Type: Patient returns to my office for continued preventative foot care services. Complaint: Patient states" my nails have grown long and thick and become painful to walk and wear her shoes.  .She  presents for preventative foot care services. No changes to ROS  Podiatric Exam: Vascular: dorsalis pedis and posterior tibial pulses are palpable bilateral. Capillary return is immediate. Temperature gradient is WNL. Skin turgor WNL  Sensorium: Normal Semmes Weinstein monofilament test. Normal tactile sensation bilaterally. Nail Exam: Pt has thick disfigured discolored nails with subungual debris noted bilateral entire nail hallux through fifth toenails Ulcer Exam: There is no evidence of ulcer or pre-ulcerative changes or infection. Orthopedic Exam: Muscle tone and strength are WNL. No limitations in general ROM. No crepitus or effusions noted. Foot type and digits show no abnormalities. Bony prominences are unremarkable. Skin: No Porokeratosis. No infection or ulcers  Diagnosis:  Tinea unguium, Pain in right toe, pain in left toes  Treatment & Plan Procedures and Treatment: Consent by patient was obtained for treatment procedures. The patient understood the discussion of treatment and procedures well. All questions were answered thoroughly reviewed. Debridement of mycotic and hypertrophic toenails, 1 through 5 bilateral and clearing of subungual debris. No ulceration, no infection noted.  Return Visit-Office Procedure: Patient instructed to return to the office for a follow up visit 3 months for continued evaluation and treatment.

## 2015-05-24 ENCOUNTER — Telehealth: Payer: Self-pay | Admitting: *Deleted

## 2015-05-24 NOTE — Telephone Encounter (Signed)
Interpretor spoke for pt, stating Dr. Stacie AcresMayer trimmed he toenails yesterday, and now there is bleeding and the other toenails were not cut right.  Pt states she informed Dr. Al CorpusHyatt, they should get rid of Dr. Stacie AcresMayer, and she only wants to see Dr. Al CorpusHyatt.  I apologized and instructed interpretor to instruct pt to apply antibiotic ointment and bandaids to the areas of concern for 3-4 days and report any signs of redness, swelling or drainage.  I also instructed the interpretor to have pt schedule with Dr. Al CorpusHyatt only.

## 2015-06-07 ENCOUNTER — Telehealth: Payer: Self-pay | Admitting: Endocrinology

## 2015-06-07 NOTE — Telephone Encounter (Signed)
Please give pt a call regarding medcaition

## 2015-06-07 NOTE — Telephone Encounter (Signed)
[phone] Is the number to [facility] Anaracy who is the pt interpretor I have called to get clarification for the over the counter Rx she is thinking about buying.

## 2015-06-18 DIAGNOSIS — M1811 Unilateral primary osteoarthritis of first carpometacarpal joint, right hand: Secondary | ICD-10-CM | POA: Diagnosis not present

## 2015-06-18 DIAGNOSIS — M1812 Unilateral primary osteoarthritis of first carpometacarpal joint, left hand: Secondary | ICD-10-CM | POA: Diagnosis not present

## 2015-07-03 ENCOUNTER — Other Ambulatory Visit: Payer: Self-pay | Admitting: Endocrinology

## 2015-07-05 DIAGNOSIS — Z23 Encounter for immunization: Secondary | ICD-10-CM | POA: Diagnosis not present

## 2015-07-23 DIAGNOSIS — M1811 Unilateral primary osteoarthritis of first carpometacarpal joint, right hand: Secondary | ICD-10-CM | POA: Diagnosis not present

## 2015-07-23 DIAGNOSIS — M1812 Unilateral primary osteoarthritis of first carpometacarpal joint, left hand: Secondary | ICD-10-CM | POA: Diagnosis not present

## 2015-07-24 ENCOUNTER — Other Ambulatory Visit: Payer: Self-pay | Admitting: Endocrinology

## 2015-07-24 ENCOUNTER — Telehealth: Payer: Self-pay

## 2015-07-24 NOTE — Telephone Encounter (Signed)
Pt will get B/P recheck on f/u visit 08/06/2015

## 2015-08-06 ENCOUNTER — Encounter: Payer: Self-pay | Admitting: Endocrinology

## 2015-08-06 ENCOUNTER — Ambulatory Visit (INDEPENDENT_AMBULATORY_CARE_PROVIDER_SITE_OTHER): Payer: Medicare Other | Admitting: Endocrinology

## 2015-08-06 VITALS — BP 124/78 | HR 94 | Temp 97.6°F | Ht 63.0 in | Wt 116.0 lb

## 2015-08-06 DIAGNOSIS — I10 Essential (primary) hypertension: Secondary | ICD-10-CM

## 2015-08-06 DIAGNOSIS — E785 Hyperlipidemia, unspecified: Secondary | ICD-10-CM

## 2015-08-06 DIAGNOSIS — E039 Hypothyroidism, unspecified: Secondary | ICD-10-CM | POA: Diagnosis not present

## 2015-08-06 DIAGNOSIS — R739 Hyperglycemia, unspecified: Secondary | ICD-10-CM

## 2015-08-06 LAB — URINALYSIS, ROUTINE W REFLEX MICROSCOPIC
BILIRUBIN URINE: NEGATIVE
Hgb urine dipstick: NEGATIVE
KETONES UR: NEGATIVE
Nitrite: NEGATIVE
PH: 5.5 (ref 5.0–8.0)
RBC / HPF: NONE SEEN (ref 0–?)
TOTAL PROTEIN, URINE-UPE24: NEGATIVE
URINE GLUCOSE: NEGATIVE
UROBILINOGEN UA: 0.2 (ref 0.0–1.0)

## 2015-08-06 LAB — CBC WITH DIFFERENTIAL/PLATELET
BASOS ABS: 0 10*3/uL (ref 0.0–0.1)
BASOS PCT: 0.5 % (ref 0.0–3.0)
EOS ABS: 0.2 10*3/uL (ref 0.0–0.7)
Eosinophils Relative: 2.6 % (ref 0.0–5.0)
HEMATOCRIT: 39 % (ref 36.0–46.0)
Hemoglobin: 12.8 g/dL (ref 12.0–15.0)
LYMPHS ABS: 1.5 10*3/uL (ref 0.7–4.0)
Lymphocytes Relative: 22 % (ref 12.0–46.0)
MCHC: 32.9 g/dL (ref 30.0–36.0)
MCV: 96.5 fl (ref 78.0–100.0)
MONO ABS: 0.6 10*3/uL (ref 0.1–1.0)
Monocytes Relative: 9.4 % (ref 3.0–12.0)
NEUTROS ABS: 4.4 10*3/uL (ref 1.4–7.7)
NEUTROS PCT: 65.5 % (ref 43.0–77.0)
PLATELETS: 267 10*3/uL (ref 150.0–400.0)
RBC: 4.04 Mil/uL (ref 3.87–5.11)
RDW: 12.6 % (ref 11.5–15.5)
WBC: 6.8 10*3/uL (ref 4.0–10.5)

## 2015-08-06 LAB — LIPID PANEL
CHOL/HDL RATIO: 4
CHOLESTEROL: 191 mg/dL (ref 0–200)
HDL: 48.6 mg/dL (ref >39.00)
NonHDL: 142.24
Triglycerides: 355 mg/dL — ABNORMAL HIGH (ref 0.0–149.0)
VLDL: 71 mg/dL — AB (ref 0.0–40.0)

## 2015-08-06 LAB — TSH: TSH: 3.47 u[IU]/mL (ref 0.35–4.50)

## 2015-08-06 LAB — BASIC METABOLIC PANEL
BUN: 18 mg/dL (ref 6–23)
CHLORIDE: 98 meq/L (ref 96–112)
CO2: 32 mEq/L (ref 19–32)
Calcium: 8.9 mg/dL (ref 8.4–10.5)
Creatinine, Ser: 0.78 mg/dL (ref 0.40–1.20)
GFR: 77.08 mL/min (ref >60.00)
Glucose, Bld: 155 mg/dL — ABNORMAL HIGH (ref 70–99)
POTASSIUM: 4.2 meq/L (ref 3.5–5.1)
SODIUM: 135 meq/L (ref 135–145)

## 2015-08-06 LAB — LDL CHOLESTEROL, DIRECT: Direct LDL: 99 mg/dL

## 2015-08-06 NOTE — Progress Notes (Signed)
Subjective:    Patient ID: Laura [name], female    DOB: 1941/01/24, 74 y.o.   MRN: [id number]  HPI The state of at least three ongoing medical problems is addressed today, with interval history of each noted here:  Tremor: pt says the klonopin does not adequately help.   Denies SOB. Hypothyroidism: she denies excessive diaphoresis.  Past Medical History  Diagnosis Date  . HYPERLIPIDEMIA 06/22/2007    rx refused  . HYPOTHYROIDISM 04/15/2009  . Anxiety state, unspecified 10/10/2007  . RESTLESS LEG SYNDROME 10/01/2008  . HYPERTENSION 05/07/2008  . 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 TRIGGER FINGER 10/30/2009  . TREMOR 11/21/2007  . Headache(784.0) 08/27/2008  . TACHYCARDIA 08/27/2008  . ASYMPTOMATIC POSTMENOPAUSAL STATUS 04/15/2009  . Deaf   . Mild mental retardation   . Dermatitis     Past Surgical History  Procedure Laterality Date  . Cataract extraction, bilateral      Social History   Social History  . Marital Status: Single    Spouse Name: N/A  . Number of Children: N/A  . Years of Education: N/A   Occupational History  . Disabled    Social History Main Topics  . Smoking status: Never Smoker   . Smokeless tobacco: Never Used  . Alcohol Use: No  . Drug Use: No  . Sexual Activity: Not on file   Other Topics Concern  . Not on file   Social History Narrative   Lives alone   Never married   Physical activity is very good   She has never had a driver's license   Does not have a phone at home     Current Outpatient Prescriptions on File Prior to Visit  Medication Sig Dispense Refill  . clonazePAM (KLONOPIN) 1 MG tablet Take 1 tablet (1 mg total) by mouth 2 (two) times daily as needed for anxiety. 60 tablet 5  . levothyroxine (SYNTHROID, LEVOTHROID) 50 MCG tablet TAKE ONE TABLET BY MOUTH ONCE DAILY 30 tablet 0  . metoprolol tartrate (LOPRESSOR) 25 MG tablet TAKE ONE-HALF TABLET BY MOUTH TWICE DAILY 30 tablet 0  . Multiple Vitamin (MULTIVITAMIN) tablet  Take 1 tablet by mouth daily.      Marland Kitchen triamcinolone cream (KENALOG) 0.1 % APPLY  CREAM EXTERNALLY THREE TIMES DAILY AS NEEDED FOR  ITCHING 45 g 2  . vitamin E 100 UNIT capsule Take 100 Units by mouth 2 (two) times daily.       No current facility-administered medications on file prior to visit.    No Known Allergies  Family History  Problem Relation Age of Onset  . Thyroid disease Neg Hx     BP 124/78 mmHg  Pulse 94  Temp(Src) 97.6 F (36.4 C) (Oral)  Ht  (1.6 m)  Wt 116 lb (52.617 kg)  BMI 20.55 kg/m2  SpO2 93%  Review of Systems Denies chest pain and weight change    Objective:   Physical Exam VITAL SIGNS:  See vs page GENERAL: no distress NECK: There is no palpable thyroid enlargement.  No thyroid nodule is palpable.  No palpable lymphadenopathy at the anterior neck. LUNGS:  Clear to auscultation HEART:  Regular rate and rhythm without murmurs noted. Normal S1,S2.   PSYCH: Does not appear anxious nor depressed.  Lab Results  Component Value Date   HGBA1C 6.4 08/08/2015   Lab Results  Component Value Date   TSH 3.47 08/06/2015   Lab Results  Component Value Date   CHOL 191 08/06/2015  HDL 48.60 08/06/2015   LDLDIRECT 99.0 08/06/2015   TRIG 355.0* 08/06/2015   CHOLHDL 4 08/06/2015      Assessment & Plan:  Hyperglycemia, new: i advised diet Movement disorder.  i told pt that extensive w/u has been neg, and this is the best we can do.  Hypothyroidism: well-replaced.  Dyslipidemia: well-controlled.  Patient is advised the following: Patient Instructions  blood tests are requested for you today.  We'll let you know about the results. Please continue the same medications.   Please come back for a follow-up appointment in 7 months.

## 2015-08-06 NOTE — Patient Instructions (Addendum)
blood tests are requested for you today.  We'll let you know about the results. Please continue the same medications.   Please come back for a follow-up appointment in 7 months.

## 2015-08-07 ENCOUNTER — Other Ambulatory Visit: Payer: Self-pay | Admitting: Endocrinology

## 2015-08-07 DIAGNOSIS — R739 Hyperglycemia, unspecified: Secondary | ICD-10-CM | POA: Insufficient documentation

## 2015-08-08 ENCOUNTER — Encounter: Payer: Self-pay | Admitting: Endocrinology

## 2015-08-08 ENCOUNTER — Other Ambulatory Visit (INDEPENDENT_AMBULATORY_CARE_PROVIDER_SITE_OTHER): Payer: Medicare Other

## 2015-08-08 DIAGNOSIS — R739 Hyperglycemia, unspecified: Secondary | ICD-10-CM

## 2015-08-08 LAB — HEMOGLOBIN A1C: HEMOGLOBIN A1C: 6.4 % (ref 4.6–6.5)

## 2015-08-13 ENCOUNTER — Telehealth: Payer: Self-pay | Admitting: Endocrinology

## 2015-08-13 NOTE — Telephone Encounter (Signed)
Patient stated that she got a letter in the mail. Her b/s has been high and she would like to get a list of foods of what she can and can't eat please advise

## 2015-08-13 NOTE — Telephone Encounter (Signed)
i printed letter 

## 2015-08-13 NOTE — Telephone Encounter (Signed)
See note below and please advise, Thanks! 

## 2015-08-13 NOTE — Telephone Encounter (Signed)
Letter mailed to the patient

## 2015-08-22 ENCOUNTER — Ambulatory Visit: Payer: Medicare Other | Admitting: Podiatry

## 2015-08-27 DIAGNOSIS — M1811 Unilateral primary osteoarthritis of first carpometacarpal joint, right hand: Secondary | ICD-10-CM | POA: Diagnosis not present

## 2015-08-27 DIAGNOSIS — M1812 Unilateral primary osteoarthritis of first carpometacarpal joint, left hand: Secondary | ICD-10-CM | POA: Diagnosis not present

## 2015-09-02 ENCOUNTER — Telehealth: Payer: Self-pay | Admitting: Endocrinology

## 2015-09-02 NOTE — Telephone Encounter (Signed)
Pt is going to do exercises at home from now on she did not feel comfortable at the place she was going to

## 2015-09-02 NOTE — Telephone Encounter (Signed)
See note below to be advised. 

## 2015-09-02 NOTE — Telephone Encounter (Signed)
Ok, thank you

## 2015-09-04 ENCOUNTER — Other Ambulatory Visit: Payer: Self-pay | Admitting: Endocrinology

## 2015-09-05 ENCOUNTER — Encounter: Payer: Self-pay | Admitting: Podiatry

## 2015-09-05 ENCOUNTER — Ambulatory Visit (INDEPENDENT_AMBULATORY_CARE_PROVIDER_SITE_OTHER): Payer: Medicare Other | Admitting: Podiatry

## 2015-09-05 DIAGNOSIS — M79672 Pain in left foot: Secondary | ICD-10-CM

## 2015-09-05 DIAGNOSIS — M79671 Pain in right foot: Secondary | ICD-10-CM | POA: Diagnosis not present

## 2015-09-05 DIAGNOSIS — B351 Tinea unguium: Secondary | ICD-10-CM | POA: Diagnosis not present

## 2015-09-05 NOTE — Progress Notes (Signed)
She presents today chief complaint of painful elongated toenails.  Objective: Vital signs are stable she is alert and oriented 3. Pulses are strongly palpable. Neurologic sensorium is intact percent was C monofilament. Toenails are thick yellow dystrophic onychomycotic with sharp innervated nail margins  Assessment: Ingrown nails with pain in limb secondary to onychomycosis 1 through 5 bilateral.  Plan: Debridement of nails 1 through 5 bilateral covered service secondary to pain follow up with her in 3 months.  Arbutus Pedodd Pavel Gadd DPM

## 2015-09-24 DIAGNOSIS — M1812 Unilateral primary osteoarthritis of first carpometacarpal joint, left hand: Secondary | ICD-10-CM | POA: Diagnosis not present

## 2015-09-24 DIAGNOSIS — M1811 Unilateral primary osteoarthritis of first carpometacarpal joint, right hand: Secondary | ICD-10-CM | POA: Diagnosis not present

## 2015-10-01 ENCOUNTER — Other Ambulatory Visit: Payer: Self-pay | Admitting: Endocrinology

## 2015-10-22 ENCOUNTER — Other Ambulatory Visit: Payer: Self-pay | Admitting: Endocrinology

## 2015-10-30 ENCOUNTER — Telehealth: Payer: Self-pay | Admitting: Endocrinology

## 2015-10-30 NOTE — Telephone Encounter (Signed)
Pt states the medicine for the tremors Klonopin is not working, she is still very shaky all day. Please advise. Heart is still beating very fast, her legs are still weak.

## 2015-10-30 NOTE — Telephone Encounter (Signed)
Attempted to reach the pt. Will try again at a later time.  

## 2015-10-30 NOTE — Telephone Encounter (Signed)
See note below and please advise, Thanks! 

## 2015-10-30 NOTE — Telephone Encounter (Signed)
please call patient: Please understand that this is the best we can do, according to the several specialists you have seen.

## 2015-11-01 NOTE — Telephone Encounter (Signed)
Attempted to reach the pt. Pt unavailable. Letter mailed to the pt.

## 2015-11-05 DIAGNOSIS — M1811 Unilateral primary osteoarthritis of first carpometacarpal joint, right hand: Secondary | ICD-10-CM | POA: Diagnosis not present

## 2015-11-05 DIAGNOSIS — M1812 Unilateral primary osteoarthritis of first carpometacarpal joint, left hand: Secondary | ICD-10-CM | POA: Diagnosis not present

## 2015-11-06 ENCOUNTER — Telehealth: Payer: Self-pay

## 2015-11-06 ENCOUNTER — Other Ambulatory Visit: Payer: Self-pay | Admitting: Endocrinology

## 2015-11-06 MED ORDER — CLONAZEPAM 1 MG PO TABS: 1.0000 mg | ORAL_TABLET | Freq: Two times a day (BID) | ORAL | Status: DC | PRN

## 2015-11-06 NOTE — Telephone Encounter (Signed)
Rx sent per pt's request.  

## 2015-11-06 NOTE — Telephone Encounter (Signed)
Patient ask when is her medication going to be sent to her, Clonazepam 1 mg, please advise

## 2015-11-06 NOTE — Telephone Encounter (Signed)
Pt is requesting a refill on her clonazepam.  Please advise, Thanks!

## 2015-11-06 NOTE — Telephone Encounter (Signed)
i printed 

## 2015-12-04 ENCOUNTER — Other Ambulatory Visit: Payer: Self-pay | Admitting: Endocrinology

## 2015-12-10 ENCOUNTER — Encounter: Payer: Self-pay | Admitting: Podiatry

## 2015-12-10 ENCOUNTER — Ambulatory Visit (INDEPENDENT_AMBULATORY_CARE_PROVIDER_SITE_OTHER): Payer: Medicare Other | Admitting: Podiatry

## 2015-12-10 DIAGNOSIS — M79676 Pain in unspecified toe(s): Secondary | ICD-10-CM | POA: Diagnosis not present

## 2015-12-10 DIAGNOSIS — B351 Tinea unguium: Secondary | ICD-10-CM | POA: Diagnosis not present

## 2015-12-10 NOTE — Progress Notes (Signed)
She presents today with chief complaint of painful elongated toenails by her interpreter.  Objective: Vital signs are stable she is alert and oriented 3. Pulses are palpable bilateral. No calf pain. Her toenails are long thick yellow dystrophic and clinically mycotic and painful on palpation.  Assessment: Pain and limb secondary to onychomycosis 1 through 5 bilateral.  Plan: Debridement of toenails 1 through 5 bilateral.

## 2015-12-17 DIAGNOSIS — M1811 Unilateral primary osteoarthritis of first carpometacarpal joint, right hand: Secondary | ICD-10-CM | POA: Diagnosis not present

## 2015-12-17 DIAGNOSIS — M1812 Unilateral primary osteoarthritis of first carpometacarpal joint, left hand: Secondary | ICD-10-CM | POA: Diagnosis not present

## 2016-01-01 ENCOUNTER — Other Ambulatory Visit: Payer: Self-pay | Admitting: Endocrinology

## 2016-01-28 DIAGNOSIS — M1811 Unilateral primary osteoarthritis of first carpometacarpal joint, right hand: Secondary | ICD-10-CM | POA: Diagnosis not present

## 2016-01-28 DIAGNOSIS — M1812 Unilateral primary osteoarthritis of first carpometacarpal joint, left hand: Secondary | ICD-10-CM | POA: Diagnosis not present

## 2016-02-12 ENCOUNTER — Other Ambulatory Visit: Payer: Self-pay | Admitting: Endocrinology

## 2016-03-09 ENCOUNTER — Ambulatory Visit (INDEPENDENT_AMBULATORY_CARE_PROVIDER_SITE_OTHER): Payer: Medicare Other | Admitting: Endocrinology

## 2016-03-09 ENCOUNTER — Encounter: Payer: Self-pay | Admitting: Endocrinology

## 2016-03-09 VITALS — BP 130/82 | HR 82 | Resp 14 | Ht 63.0 in | Wt 115.4 lb

## 2016-03-09 DIAGNOSIS — Z780 Asymptomatic menopausal state: Secondary | ICD-10-CM

## 2016-03-09 DIAGNOSIS — R251 Tremor, unspecified: Secondary | ICD-10-CM | POA: Diagnosis not present

## 2016-03-09 DIAGNOSIS — Z0000 Encounter for general adult medical examination without abnormal findings: Secondary | ICD-10-CM | POA: Diagnosis not present

## 2016-03-09 DIAGNOSIS — N951 Menopausal and female climacteric states: Secondary | ICD-10-CM

## 2016-03-09 MED ORDER — CLONAZEPAM 0.5 MG PO TABS: 1.5000 mg | ORAL_TABLET | Freq: Two times a day (BID) | ORAL | Status: DC | PRN

## 2016-03-09 NOTE — Progress Notes (Signed)
Subjective:    Patient ID: Laura [name], female    DOB: 1941-01-08, 75 y.o.   MRN: [id number]  HPI Pt returns for f/u tremor: pt says the klonopin does not adequately help.   Past Medical History  Diagnosis Date  . HYPERLIPIDEMIA 06/22/2007    rx refused  . HYPOTHYROIDISM 04/15/2009  . Anxiety state, unspecified 10/10/2007  . RESTLESS LEG SYNDROME 10/01/2008  . HYPERTENSION 05/07/2008  . 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 TRIGGER FINGER 10/30/2009  . TREMOR 11/21/2007  . Headache(784.0) 08/27/2008  . TACHYCARDIA 08/27/2008  . ASYMPTOMATIC POSTMENOPAUSAL STATUS 04/15/2009  . Deaf   . Mild mental retardation   . Dermatitis     Past Surgical History  Procedure Laterality Date  . Cataract extraction, bilateral      Social History   Social History  . Marital Status: Single    Spouse Name: N/A  . Number of Children: N/A  . Years of Education: N/A   Occupational History  . Disabled    Social History Main Topics  . Smoking status: Never Smoker   . Smokeless tobacco: Never Used  . Alcohol Use: No  . Drug Use: No  . Sexual Activity: Not on file   Other Topics Concern  . Not on file   Social History Narrative   Lives alone   Never married   Physical activity is very good   She has never had a driver's license   Does not have a phone at home     Current Outpatient Prescriptions on File Prior to Visit  Medication Sig Dispense Refill  . cholecalciferol (VITAMIN D) 1000 UNITS tablet Take 1,000 Units by mouth daily.    Marland Kitchen levothyroxine (SYNTHROID, LEVOTHROID) 50 MCG tablet TAKE ONE TABLET BY MOUTH ONCE DAILY 30 tablet 0  . metoprolol tartrate (LOPRESSOR) 25 MG tablet TAKE ONE-HALF TABLET BY MOUTH TWICE DAILY 30 tablet 0  . Multiple Vitamin (MULTIVITAMIN) tablet Take 1 tablet by mouth daily.      Marland Kitchen triamcinolone cream (KENALOG) 0.1 % APPLY CREAM EXTERNALLY THREE TIMES DAILY AS NEEDED FOR  ITCHING 45 g 0  . vitamin E 100 UNIT capsule Take 100 Units by mouth 2 (two)  times daily.       No current facility-administered medications on file prior to visit.    No Known Allergies  Family History  Problem Relation Age of Onset  . Thyroid disease Neg Hx     BP 130/82 mmHg  Pulse 82  Resp 14  Ht  (1.6 m)  Wt 115 lb 6.4 oz (52.345 kg)  BMI 20.45 kg/m2  SpO2 96%    Review of Systems Denies insomnia.     Objective:   Physical Exam VITAL SIGNS:  See vs page.  GENERAL: no distress.  Neuro: slight tremor of the hands.    Lab Results  Component Value Date   TSH 3.47 08/06/2015       Assessment & Plan:  Tremor: she needs increased rx: Here is a prescription to increase the clonazepam.    Subjective:   Patient here for Medicare annual wellness visit and management of other chronic and acute problems.     Risk factors: advanced age    Roster of Physicians Providing Medical Care to Patient:  See "snapshot"   Activities of Daily Living: In your present state of health, do you have any difficulty performing the following activities (lives in independent living apt)?:  Preparing food and eating?: No  Bathing  yourself: No  Getting dressed: No  Using the toilet:No  Moving around from place to place: No  In the past year have you fallen or had a near fall?: No    Home Safety: Has smoke detector and wears seat belts. No firearms. No excess sun exposure.  Diet and Exercise  Current exercise habits: pt says good Dietary issues discussed: pt reports a healthy diet.    Depression Screen  Q1: Over the past two weeks, have you felt down, depressed or hopeless?no  Q2: Over the past two weeks, have you felt little interest or pleasure in doing things? no   The following portions of the patient's history were reviewed and updated as appropriate: allergies, current medications, past family history, past medical history, past social history, past surgical history and problem list.   Review of Systems  Has chronic hearing loss; denies visual  loss Objective:   Vision:  Sees optometrist--declines [facility] today Hearing: none (chronic) Body mass index:  See vs page Msk: pt easily and quickly performs "get-up-and-go" from a sitting position.  Cognitive Impairment Assessment: cognition, memory and judgment appear normal.  remembers 3/3 at 5 minutes.  excellent recall.  can easily read and write a sentence.  alert and oriented x 3   Assessment:   Medicare wellness utd on preventive parameters    Plan:   During the course of the visit the patient was educated and counseled about appropriate screening and preventive services including:       Fall prevention   Screening mammography  Bone densitometry screening  Diabetes screening  Nutrition counseling   Vaccines / LABS Zostavax / Pneumococcal Vaccine  today   Patient Instructions (the written plan) was given to the patient.

## 2016-03-09 NOTE — Patient Instructions (Addendum)
Please come back for a follow-up appointment in 6 months.   Here is a prescription to increase the clonazepam.  please consider these measures for your health:  minimize alcohol.  do not use tobacco products.  have a colonoscopy at least every 10 years from age 73.  Women should have an annual mammogram from age 73.  keep firearms safely stored.  always use seat belts.  have working smoke alarms in your home.  see an eye doctor and dentist regularly.  never drive under the influence of alcohol or drugs (including prescription drugs).  those with fair skin should take precautions against the sun. it is critically important to prevent falling down (keep floor areas well-lit, dry, and free of loose objects.  If you have a cane, walker, or wheelchair, you should use it, even for short trips around the house.  Wear flat-soled shoes.  Also, try not to rush) good diet and exercise significantly improve your health.  please let me know if you wish to be referred to a dietician.  high blood sugar is very risky to your health.  you should see an eye doctor and dentist every year.  It is very important to get all recommended vaccinations.

## 2016-03-09 NOTE — Progress Notes (Signed)
we discussed code status.  pt declines to decide

## 2016-03-10 ENCOUNTER — Ambulatory Visit: Payer: Medicare Other | Admitting: Podiatry

## 2016-03-10 DIAGNOSIS — M1812 Unilateral primary osteoarthritis of first carpometacarpal joint, left hand: Secondary | ICD-10-CM | POA: Diagnosis not present

## 2016-03-10 DIAGNOSIS — M1811 Unilateral primary osteoarthritis of first carpometacarpal joint, right hand: Secondary | ICD-10-CM | POA: Diagnosis not present

## 2016-03-12 ENCOUNTER — Ambulatory Visit (INDEPENDENT_AMBULATORY_CARE_PROVIDER_SITE_OTHER): Payer: Medicare Other | Admitting: Podiatry

## 2016-03-12 ENCOUNTER — Encounter: Payer: Self-pay | Admitting: Podiatry

## 2016-03-12 DIAGNOSIS — Q828 Other specified congenital malformations of skin: Secondary | ICD-10-CM

## 2016-03-12 DIAGNOSIS — B351 Tinea unguium: Secondary | ICD-10-CM | POA: Diagnosis not present

## 2016-03-12 DIAGNOSIS — M79676 Pain in unspecified toe(s): Secondary | ICD-10-CM

## 2016-03-14 NOTE — Progress Notes (Signed)
She presents today with her interpreter as she is deaf. She is complaining of painful elongated toenails with corns and calluses to the distal aspect of the toes.  Objective: Vital signs stable alert and oriented 3. Pulses are palpable. Neurologic sensorium is intact. Deep tendon reflexes are intact. Muscle strength is normal. Toenails are elongated dystrophic and clinically mycotic. Distal clavi are noted to toes #2 and #3 primarily bilateral foot. No signs of infection.  Assessment: Distal porokeratosis and pain in limb secondary to onychomycosis.  Plan: Debridement of toenails and reactive hyperkeratoses bilateral.

## 2016-03-23 ENCOUNTER — Ambulatory Visit (INDEPENDENT_AMBULATORY_CARE_PROVIDER_SITE_OTHER)
Admission: RE | Admit: 2016-03-23 | Discharge: 2016-03-23 | Disposition: A | Discharge status: Home / Self Care | Payer: Medicare Other | Source: Ambulatory Visit | Attending: Endocrinology | Admitting: Endocrinology

## 2016-03-23 DIAGNOSIS — Z780 Asymptomatic menopausal state: Secondary | ICD-10-CM | POA: Diagnosis not present

## 2016-03-23 DIAGNOSIS — N951 Menopausal and female climacteric states: Secondary | ICD-10-CM

## 2016-03-25 DIAGNOSIS — Z780 Asymptomatic menopausal state: Secondary | ICD-10-CM | POA: Insufficient documentation

## 2016-04-01 ENCOUNTER — Other Ambulatory Visit: Payer: Self-pay | Admitting: Endocrinology

## 2016-04-01 ENCOUNTER — Encounter: Payer: Self-pay | Admitting: Endocrinology

## 2016-04-29 ENCOUNTER — Other Ambulatory Visit: Payer: Self-pay | Admitting: Endocrinology

## 2016-05-11 ENCOUNTER — Encounter: Payer: Self-pay | Admitting: Internal Medicine

## 2016-05-18 DIAGNOSIS — M1812 Unilateral primary osteoarthritis of first carpometacarpal joint, left hand: Secondary | ICD-10-CM | POA: Diagnosis not present

## 2016-06-02 ENCOUNTER — Other Ambulatory Visit: Payer: Self-pay | Admitting: Endocrinology

## 2016-06-04 ENCOUNTER — Other Ambulatory Visit: Payer: Self-pay | Admitting: Endocrinology

## 2016-06-11 ENCOUNTER — Encounter: Payer: Self-pay | Admitting: Podiatry

## 2016-06-11 ENCOUNTER — Ambulatory Visit (INDEPENDENT_AMBULATORY_CARE_PROVIDER_SITE_OTHER): Payer: Medicare Other | Admitting: Podiatry

## 2016-06-11 DIAGNOSIS — M79676 Pain in unspecified toe(s): Secondary | ICD-10-CM | POA: Diagnosis not present

## 2016-06-11 DIAGNOSIS — B351 Tinea unguium: Secondary | ICD-10-CM | POA: Diagnosis not present

## 2016-06-11 DIAGNOSIS — Q828 Other specified congenital malformations of skin: Secondary | ICD-10-CM

## 2016-06-13 NOTE — Progress Notes (Signed)
She presents today for follow-up complaining of painful elongated toenails with points and calluses.  Objective: Vital signs are stable she is alert and oriented 3 pulses are palpable. Toenails thick yellow dystrophic with mycotic painful palpation with multiple porokeratotic lesions.  Assessment: Pain in limb seeing onychomycosis porokeratosis.  Plan: Debridement of toenails 1 through 5 bilateral. Debridement of all reactive hyperkeratosis.

## 2016-06-30 ENCOUNTER — Other Ambulatory Visit: Payer: Self-pay | Admitting: Endocrinology

## 2016-06-30 DIAGNOSIS — M1812 Unilateral primary osteoarthritis of first carpometacarpal joint, left hand: Secondary | ICD-10-CM | POA: Diagnosis not present

## 2016-06-30 DIAGNOSIS — M1811 Unilateral primary osteoarthritis of first carpometacarpal joint, right hand: Secondary | ICD-10-CM | POA: Diagnosis not present

## 2016-07-27 ENCOUNTER — Other Ambulatory Visit: Payer: Self-pay | Admitting: Endocrinology

## 2016-08-02 DIAGNOSIS — Z23 Encounter for immunization: Secondary | ICD-10-CM | POA: Diagnosis not present

## 2016-08-25 ENCOUNTER — Other Ambulatory Visit: Payer: Self-pay | Admitting: Endocrinology

## 2016-08-28 DIAGNOSIS — M1811 Unilateral primary osteoarthritis of first carpometacarpal joint, right hand: Secondary | ICD-10-CM | POA: Diagnosis not present

## 2016-08-28 DIAGNOSIS — M1812 Unilateral primary osteoarthritis of first carpometacarpal joint, left hand: Secondary | ICD-10-CM | POA: Diagnosis not present

## 2016-09-02 ENCOUNTER — Telehealth: Payer: Self-pay | Admitting: Endocrinology

## 2016-09-02 ENCOUNTER — Other Ambulatory Visit: Payer: Self-pay | Admitting: Endocrinology

## 2016-09-02 NOTE — Telephone Encounter (Signed)
I printed  

## 2016-09-08 ENCOUNTER — Ambulatory Visit (INDEPENDENT_AMBULATORY_CARE_PROVIDER_SITE_OTHER): Payer: Medicare Other | Admitting: Endocrinology

## 2016-09-08 ENCOUNTER — Encounter: Payer: Self-pay | Admitting: Endocrinology

## 2016-09-08 VITALS — BP 118/78 | HR 70 | Ht 63.0 in | Wt 120.0 lb

## 2016-09-08 DIAGNOSIS — E039 Hypothyroidism, unspecified: Secondary | ICD-10-CM

## 2016-09-08 DIAGNOSIS — Z0000 Encounter for general adult medical examination without abnormal findings: Secondary | ICD-10-CM | POA: Diagnosis not present

## 2016-09-08 DIAGNOSIS — E785 Hyperlipidemia, unspecified: Secondary | ICD-10-CM

## 2016-09-08 DIAGNOSIS — R739 Hyperglycemia, unspecified: Secondary | ICD-10-CM | POA: Diagnosis not present

## 2016-09-08 DIAGNOSIS — I10 Essential (primary) hypertension: Secondary | ICD-10-CM | POA: Diagnosis not present

## 2016-09-08 LAB — CBC WITH DIFFERENTIAL/PLATELET
BASOS ABS: 0 10*3/uL (ref 0.0–0.1)
BASOS PCT: 0.3 % (ref 0.0–3.0)
EOS PCT: 3.3 % (ref 0.0–5.0)
Eosinophils Absolute: 0.3 10*3/uL (ref 0.0–0.7)
HEMATOCRIT: 39.2 % (ref 36.0–46.0)
Hemoglobin: 13.1 g/dL (ref 12.0–15.0)
LYMPHS ABS: 2 10*3/uL (ref 0.7–4.0)
LYMPHS PCT: 23.1 % (ref 12.0–46.0)
MCHC: 33.5 g/dL (ref 30.0–36.0)
MCV: 94.7 fl (ref 78.0–100.0)
MONOS PCT: 9.9 % (ref 3.0–12.0)
Monocytes Absolute: 0.9 10*3/uL (ref 0.1–1.0)
NEUTROS ABS: 5.6 10*3/uL (ref 1.4–7.7)
NEUTROS PCT: 63.4 % (ref 43.0–77.0)
PLATELETS: 319 10*3/uL (ref 150.0–400.0)
RBC: 4.14 Mil/uL (ref 3.87–5.11)
RDW: 12.9 % (ref 11.5–15.5)
WBC: 8.8 10*3/uL (ref 4.0–10.5)

## 2016-09-08 LAB — URINALYSIS, ROUTINE W REFLEX MICROSCOPIC
BILIRUBIN URINE: NEGATIVE
HGB URINE DIPSTICK: NEGATIVE
KETONES UR: NEGATIVE
NITRITE: NEGATIVE
RBC / HPF: NONE SEEN (ref 0–?)
Specific Gravity, Urine: 1.01 (ref 1.000–1.030)
TOTAL PROTEIN, URINE-UPE24: NEGATIVE
URINE GLUCOSE: NEGATIVE
Urobilinogen, UA: 0.2 (ref 0.0–1.0)
pH: 7 (ref 5.0–8.0)

## 2016-09-08 LAB — BASIC METABOLIC PANEL
BUN: 20 mg/dL (ref 6–23)
CALCIUM: 9.2 mg/dL (ref 8.4–10.5)
CO2: 32 meq/L (ref 19–32)
CREATININE: 0.88 mg/dL (ref 0.40–1.20)
Chloride: 99 mEq/L (ref 96–112)
GFR: 66.86 mL/min (ref >60.00)
GLUCOSE: 90 mg/dL (ref 70–99)
Potassium: 4.3 mEq/L (ref 3.5–5.1)
SODIUM: 136 meq/L (ref 135–145)

## 2016-09-08 LAB — HEMOGLOBIN A1C: HEMOGLOBIN A1C: 6.4 % (ref 4.6–6.5)

## 2016-09-08 LAB — LIPID PANEL
Cholesterol: 202 mg/dL — ABNORMAL HIGH (ref 0–200)
HDL: 54.4 mg/dL (ref >39.00)
Total CHOL/HDL Ratio: 4
Triglycerides: 414 mg/dL — ABNORMAL HIGH (ref 0.0–149.0)

## 2016-09-08 LAB — TSH: TSH: 2.78 u[IU]/mL (ref 0.35–4.50)

## 2016-09-08 LAB — LDL CHOLESTEROL, DIRECT: LDL DIRECT: 95 mg/dL

## 2016-09-08 NOTE — Progress Notes (Signed)
Subjective:    Patient ID: Laura [name], female    DOB: Nov 23, 1941, 75 y.o.   MRN: [id number]  HPI Tremor: she takes klonopin BID.  She says it does not last the full 12 hrs.  HTN: denies sob.   Hypothyroidism: she takes synthroid as rx'ed.  No weight change Past Medical History:  Diagnosis Date  . Anxiety state, unspecified 10/10/2007  . ASYMPTOMATIC POSTMENOPAUSAL STATUS 04/15/2009  . Deaf   . Dermatitis   . Headache(784.0) 08/27/2008  . HYPERLIPIDEMIA 06/22/2007   rx refused  . HYPERTENSION 05/07/2008  . HYPOTHYROIDISM 04/15/2009  . Mild mental retardation   . 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 RESTLESS LEG SYNDROME 10/01/2008  . TACHYCARDIA 08/27/2008  . TREMOR 11/21/2007  . TRIGGER FINGER 10/30/2009    Past Surgical History:  Procedure Laterality Date  . CATARACT EXTRACTION, BILATERAL      Social History   Social History  . Marital status: Single    Spouse name: N/A  . Number of children: N/A  . Years of education: N/A   Occupational History  . Disabled Unemployed   Social History Main Topics  . Smoking status: Never Smoker  . Smokeless tobacco: Never Used  . Alcohol use No  . Drug use: No  . Sexual activity: Not on file   Other Topics Concern  . Not on file   Social History Narrative   Lives alone   Never married   Physical activity is very good   She has never had a driver's license   Does not have a phone at home     Current Outpatient Prescriptions on File Prior to Visit  Medication Sig Dispense Refill  . cholecalciferol (VITAMIN D) 1000 UNITS tablet Take 1,000 Units by mouth daily.    . clonazePAM (KLONOPIN) 0.5 MG tablet TAKE THREE TABLETS BY MOUTH TWICE DAILY AS NEEDED FOR ANXIETY OR  TREMOR 180 tablet 5  . levothyroxine (SYNTHROID, LEVOTHROID) 50 MCG tablet TAKE ONE TABLET BY MOUTH ONCE DAILY 30 tablet 0  . metoprolol tartrate (LOPRESSOR) 25 MG tablet TAKE ONE-HALF TABLET BY MOUTH TWICE DAILY 30 tablet 0  . Multiple Vitamin  (MULTIVITAMIN) tablet Take 1 tablet by mouth daily.      Marland Kitchen triamcinolone cream (KENALOG) 0.1 % APPLY  CREAM EXTERNALLY THREE TIMES DAILY AS NEEDED FOR ITCHING 80 g 2  . vitamin E 100 UNIT capsule Take 100 Units by mouth 2 (two) times daily.       No current facility-administered medications on file prior to visit.     No Known Allergies  Family History  Problem Relation Age of Onset  . Thyroid disease Neg Hx     BP 118/78   Pulse 70   Ht 5\' 3"  (1.6 m)   Wt 120 lb (54.4 kg)   SpO2 97%   BMI 21.26 kg/m   Review of Systems Denies chest pain and falls    Objective:   Physical Exam VS: see vs page GEN: no distress.   NECK: supple, thyroid is not enlarged CHEST WALL: no deformity.  LUNGS: clear to auscultation.   CV: reg rate and rhythm, no murmur.  MUSCULOSKELETAL: muscle bulk and strength are grossly normal.  no obvious joint swelling.  gait is normal and steady EXTEMITIES: no deformity.  no ulcer on the feet.  feet are of normal color and temp.  no edema PULSES: dorsalis pedis intact bilat.  no carotid bruit NEURO:  cn 2-12 grossly intact (except for hearing  loss).   readily moves all 4's.  sensation is intact to touch on the feet.   SKIN:  Normal texture and temperature.  No rash or suspicious lesion is visible.   NODES:  None palpable at the neck PSYCH: alert, well-oriented.  Does not appear anxious nor depressed.    ecg machine is not working today.      Assessment & Plan:  Tremor/anxiety: she needs increased rx Hypothyroidism: due for recheck HTN: well-controlled.  Patient is advised the following: Patient Instructions  Please come back for a "medicare wellness" appointment in 6 months (must be after 03/09/17). Please continue the same metoprolol.   blood tests are requested for you today.  We'll let you know about the results. You can take the clonazepam as many as 3 per day.    Pt declines to schedule colonoscopy today.  I have offered to call to schedule  appt, and she also declines.

## 2016-09-08 NOTE — Patient Instructions (Addendum)
Please come back for a "medicare wellness" appointment in 6 months (must be after 03/09/17). Please continue the same metoprolol.   blood tests are requested for you today.  We'll let you know about the results. You can take the clonazepam as many as 3 per day.

## 2016-09-15 ENCOUNTER — Encounter: Payer: Self-pay | Admitting: Internal Medicine

## 2016-09-16 NOTE — Telephone Encounter (Signed)
See message and please advise, Thanks!  

## 2016-09-16 NOTE — Telephone Encounter (Signed)
Was refilled last week. Alternatives have been tried.  This is the best we have

## 2016-09-16 NOTE — Telephone Encounter (Signed)
Refill of clonazePAM (KLONOPIN) 0.5 MG tablet patient stated that  Have this shaking in her legs, medicine is not working it there anything else she can take?  [facility] [address] - [place], [place] - [address] [phone] (Phone) [phone] (Fax)

## 2016-09-16 NOTE — Telephone Encounter (Signed)
Attempted to reach the patient. Patient was not available, will try again at a later time.  

## 2016-09-17 ENCOUNTER — Encounter: Payer: Self-pay | Admitting: Podiatry

## 2016-09-17 ENCOUNTER — Encounter: Payer: Medicare Other | Admitting: Podiatry

## 2016-09-17 ENCOUNTER — Ambulatory Visit (INDEPENDENT_AMBULATORY_CARE_PROVIDER_SITE_OTHER): Payer: Medicare Other | Admitting: Podiatry

## 2016-09-17 DIAGNOSIS — M79676 Pain in unspecified toe(s): Secondary | ICD-10-CM

## 2016-09-17 DIAGNOSIS — Q828 Other specified congenital malformations of skin: Secondary | ICD-10-CM | POA: Diagnosis not present

## 2016-09-17 DIAGNOSIS — B351 Tinea unguium: Secondary | ICD-10-CM

## 2016-09-19 NOTE — Progress Notes (Signed)
She presents today without her interpreter. She is here for routine nail debridement.  Objective: Vital signs are stable she is alert and oriented 3 pulses are palpable. Neurologic sensorium is intact. Deep tendon reflexes are intact. She has distal clavi and porokeratosis plantar aspect of the bilateral foot. Toenails are thick yellow dystrophic mycotic painful palpation as well as debridement.  Assessment: Pain limbs and onychomycosis porokeratosis.  Plan: Debridement of toenails 1 through 5 bilateral. Debridement of all reactive hyperkeratosis. Follow up with her in 3 months.

## 2016-09-22 ENCOUNTER — Ambulatory Visit: Payer: Medicare Other | Admitting: Podiatry

## 2016-09-23 ENCOUNTER — Other Ambulatory Visit: Payer: Self-pay | Admitting: Endocrinology

## 2016-09-28 NOTE — Telephone Encounter (Signed)
Patient was unavailable and did not have a working Engineer, technical salesvoicemail at this time. Will wait for the patient to call back to discuss as need be.

## 2016-10-03 NOTE — Progress Notes (Signed)
This encounter was created in error - please disregard.

## 2016-10-05 DIAGNOSIS — M1811 Unilateral primary osteoarthritis of first carpometacarpal joint, right hand: Secondary | ICD-10-CM | POA: Diagnosis not present

## 2016-10-05 DIAGNOSIS — M1812 Unilateral primary osteoarthritis of first carpometacarpal joint, left hand: Secondary | ICD-10-CM | POA: Diagnosis not present

## 2016-10-26 ENCOUNTER — Other Ambulatory Visit: Payer: Self-pay | Admitting: Endocrinology

## 2016-10-28 ENCOUNTER — Telehealth: Payer: Self-pay | Admitting: Endocrinology

## 2016-10-28 NOTE — Telephone Encounter (Signed)
See message and please advise, Thanks!  

## 2016-10-28 NOTE — Telephone Encounter (Signed)
Try moving more of the clonazepam from morning to night.

## 2016-10-28 NOTE — Telephone Encounter (Signed)
In the middle of the night her legs shake badly clonazepam helps during the day but at night it does not help

## 2016-10-29 ENCOUNTER — Other Ambulatory Visit: Payer: Self-pay

## 2016-10-29 NOTE — Telephone Encounter (Signed)
Attempted to reach the patient. Number listed would not connect to the patient. Will try again at a later time.

## 2016-10-30 ENCOUNTER — Ambulatory Visit (AMBULATORY_SURGERY_CENTER): Payer: Self-pay | Admitting: *Deleted

## 2016-10-30 VITALS — Ht 63.0 in | Wt 114.0 lb

## 2016-10-30 DIAGNOSIS — Z1211 Encounter for screening for malignant neoplasm of colon: Secondary | ICD-10-CM

## 2016-10-30 MED ORDER — NA SULFATE-K SULFATE-MG SULF 17.5-3.13-1.6 GM/177ML PO SOLN: 1.0000 | Freq: Once | ORAL | 0 refills | Status: AC

## 2016-10-30 NOTE — Progress Notes (Signed)
No egg or soy allergy known to patient  No issues with past sedation with any surgeries  or procedures, no intubation problems  No diet pills per patient No home 02 use per patient  No blood thinners per patient  Pt denies issues with constipation  No A fib or A flutter   POA susan and sign language interpreter in [place] today with patient

## 2016-11-05 NOTE — Telephone Encounter (Signed)
Attempted to reach the patient. Number listed would not connect to the patient

## 2016-11-13 ENCOUNTER — Ambulatory Visit (AMBULATORY_SURGERY_CENTER): Payer: Medicare Other | Admitting: Internal Medicine

## 2016-11-13 ENCOUNTER — Encounter: Payer: Self-pay | Admitting: Internal Medicine

## 2016-11-13 VITALS — BP 134/74 | HR 78 | Temp 96.6°F | Resp 18 | Ht 63.0 in | Wt 114.0 lb

## 2016-11-13 DIAGNOSIS — E079 Disorder of thyroid, unspecified: Secondary | ICD-10-CM | POA: Diagnosis not present

## 2016-11-13 DIAGNOSIS — Z1211 Encounter for screening for malignant neoplasm of colon: Secondary | ICD-10-CM | POA: Diagnosis not present

## 2016-11-13 DIAGNOSIS — I10 Essential (primary) hypertension: Secondary | ICD-10-CM | POA: Diagnosis not present

## 2016-11-13 DIAGNOSIS — Z1212 Encounter for screening for malignant neoplasm of rectum: Secondary | ICD-10-CM

## 2016-11-13 MED ORDER — SODIUM CHLORIDE 0.9 % IV SOLN: 500.0000 mL | INTRAVENOUS | Status: DC

## 2016-11-13 NOTE — Progress Notes (Signed)
Patient awakening,vss,report to rn 

## 2016-11-13 NOTE — Op Note (Signed)
[facility] Patient Name: Laura [name] Procedure Date: 11/13/2016 11:37 AM MRN: [id number] Endoscopist: Wilhemina Bonito. Marina Goodell , MD Age: 75 Referring MD:  Date of Birth: [birth year] Gender: Female Account #: [id number] Procedure:                Colonoscopy Indications:              Screening for colorectal malignant neoplasm.                            Negative index exam 2007 Medicines:                Monitored Anesthesia Care Procedure:                Pre-Anesthesia Assessment:                           - Prior to the procedure, a History and Physical                            was performed, and patient medications and                            allergies were reviewed. The patient's tolerance of                            previous anesthesia was also reviewed. The risks                            and benefits of the procedure and the sedation                            options and risks were discussed with the patient.                            All questions were answered, and informed consent                            was obtained. Prior Anticoagulants: The patient has                            taken no previous anticoagulant or antiplatelet                            agents. ASA Grade Assessment: II - A patient with                            mild systemic disease. After reviewing the risks                            and benefits, the patient was deemed in                            satisfactory condition to undergo the procedure.  After obtaining informed consent, the colonoscope                            was passed under direct vision. Throughout the                            procedure, the patient's blood pressure, pulse, and                            oxygen saturations were monitored continuously. The                            Model CF-HQ190L [id number]) scope was introduced                            through the anus and advanced to the  the cecum,                            identified by appendiceal orifice and ileocecal                            valve. The ileocecal valve, appendiceal orifice,                            and rectum were photographed. The quality of the                            bowel preparation was good. The colonoscopy was                            performed without difficulty. The patient tolerated                            the procedure well. The bowel preparation used was                            SUPREP. Scope In: 11:41:52 AM Scope Out: 11:50:19 AM Scope Withdrawal Time: 0 hours 6 minutes 6 seconds  Total Procedure Duration: 0 hours 8 minutes 27 seconds  Findings:                 Multiple diverticula were found in the sigmoid                            colon.                           Internal hemorrhoids were found during retroflexion.                           The exam was otherwise without abnormality on                            direct and retroflexion views. Complications:            No immediate complications. Estimated  blood loss:                            None. Estimated Blood Loss:     Estimated blood loss: none. Impression:               - Diverticulosis in the sigmoid colon.                           - Internal hemorrhoids.                           - The examination was otherwise normal on direct                            and retroflexion views.                           - No specimens collected. Recommendation:           - Repeat colonoscopy is not recommended for                            screening purposes.                           - Patient has a contact number available for                            emergencies. The signs and symptoms of potential                            delayed complications were discussed with the                            patient. Return to normal activities tomorrow.                            Written discharge instructions were provided to  the                            patient.                           - Resume previous diet.                           - Continue present medications. Wilhemina BonitoJohn N. Marina GoodellPerry, MD 11/13/2016 11:54:38 AM This report has been signed electronically.

## 2016-11-13 NOTE — Patient Instructions (Signed)
YOU HAD AN ENDOSCOPIC PROCEDURE TODAY AT THE [facility]:   Refer to the procedure report that was given to you for any specific questions about what was found during the examination.  If the procedure report does not answer your questions, please call your gastroenterologist to clarify.  If you requested that your care partner not be given the details of your procedure findings, then the procedure report has been included in a sealed envelope for you to review at your convenience later.  YOU SHOULD EXPECT: Some feelings of bloating in the abdomen. Passage of more gas than usual.  Walking can help get rid of the air that was put into your GI tract during the procedure and reduce the bloating. If you had a lower endoscopy (such as a colonoscopy or flexible sigmoidoscopy) you may notice spotting of blood in your stool or on the toilet paper. If you underwent a bowel prep for your procedure, you may not have a normal bowel movement for a few days.  Please Note:  You might notice some irritation and congestion in your nose or some drainage.  This is from the oxygen used during your procedure.  There is no need for concern and it should clear up in a day or so.  SYMPTOMS TO REPORT IMMEDIATELY:   Following lower endoscopy (colonoscopy or flexible sigmoidoscopy):  Excessive amounts of blood in the stool  Significant tenderness or worsening of abdominal pains  Swelling of the abdomen that is new, acute  Fever of 100F or higher   Following upper endoscopy (EGD)  Vomiting of blood or coffee ground material  New chest pain or pain under the shoulder blades  Painful or persistently difficult swallowing  New shortness of breath  Fever of 100F or higher  Black, tarry-looking stools  For urgent or emergent issues, a gastroenterologist can be reached at any hour by calling [phone].   DIET:  We do recommend a small meal at first, but then you may proceed to your regular diet.  Drink  plenty of fluids but you should avoid alcoholic beverages for 24 hours.  ACTIVITY:  You should plan to take it easy for the rest of today and you should NOT DRIVE or use heavy machinery until tomorrow (because of the sedation medicines used during the test).    FOLLOW UP: Our staff will call the number listed on your records the next business day following your procedure to check on you and address any questions or concerns that you may have regarding the information given to you following your procedure. If we do not reach you, we will leave a message.  However, if you are feeling well and you are not experiencing any problems, there is no need to return our call.  We will assume that you have returned to your regular daily activities without incident.  If any biopsies were taken you will be contacted by phone or by letter within the next 1-3 weeks.  Please call us at [phone] if you have not heard about the biopsies in 3 weeks.    SIGNATURES/CONFIDENTIALITY: You and/or your care partner have signed paperwork which will be entered into your electronic medical record.  These signatures attest to the fact that that the information above on your After Visit Summary has been reviewed and is understood.  Full responsibility of the confidentiality of this discharge information lies with you and/or your care-partner.   Information on diverticulosis ,hemorrhoids and high fiber diet given to  you today

## 2016-11-17 ENCOUNTER — Telehealth: Payer: Self-pay | Admitting: *Deleted

## 2016-11-17 NOTE — Telephone Encounter (Signed)
No answer. Message left to call if questions or concerns. 

## 2016-11-24 ENCOUNTER — Other Ambulatory Visit: Payer: Self-pay | Admitting: Endocrinology

## 2016-12-08 DIAGNOSIS — M1812 Unilateral primary osteoarthritis of first carpometacarpal joint, left hand: Secondary | ICD-10-CM | POA: Diagnosis not present

## 2016-12-08 DIAGNOSIS — M1811 Unilateral primary osteoarthritis of first carpometacarpal joint, right hand: Secondary | ICD-10-CM | POA: Diagnosis not present

## 2016-12-15 ENCOUNTER — Ambulatory Visit (INDEPENDENT_AMBULATORY_CARE_PROVIDER_SITE_OTHER): Payer: Medicare Other | Admitting: Podiatry

## 2016-12-15 ENCOUNTER — Encounter: Payer: Self-pay | Admitting: Podiatry

## 2016-12-15 DIAGNOSIS — B351 Tinea unguium: Secondary | ICD-10-CM | POA: Diagnosis not present

## 2016-12-15 DIAGNOSIS — M79676 Pain in unspecified toe(s): Secondary | ICD-10-CM

## 2016-12-15 DIAGNOSIS — Q828 Other specified congenital malformations of skin: Secondary | ICD-10-CM

## 2016-12-16 NOTE — Progress Notes (Signed)
She presents today chief complaint of painful elongated toenails corns and calluses.  Objective: Vital signs are stable she is alert and oriented 3. Pulses are palpable. Neurologic sensory is intact. Toenails are long thick yellow dystrophic onychomycotic in need of care. She has distal clavi to the toes which are thick and painful. Otherwise plantar aspect of the foot does not demonstrate any wounds.  Assessment: Pain in limb secondary to onychomycosis and porokeratosis.  Plan: Debridement of toenails 1 through 5 bilateral. Debridement of all reactive hyperkeratotic tissue. Follow-up with her in 3 months

## 2016-12-23 ENCOUNTER — Other Ambulatory Visit: Payer: Self-pay | Admitting: Endocrinology

## 2017-01-19 DIAGNOSIS — M1812 Unilateral primary osteoarthritis of first carpometacarpal joint, left hand: Secondary | ICD-10-CM | POA: Diagnosis not present

## 2017-01-19 DIAGNOSIS — M1811 Unilateral primary osteoarthritis of first carpometacarpal joint, right hand: Secondary | ICD-10-CM | POA: Diagnosis not present

## 2017-01-28 ENCOUNTER — Other Ambulatory Visit: Payer: Self-pay

## 2017-01-28 MED ORDER — LEVOTHYROXINE SODIUM 50 MCG PO TABS: 50.0000 ug | ORAL_TABLET | Freq: Every day | ORAL | 1 refills | Status: DC

## 2017-02-22 ENCOUNTER — Telehealth: Payer: Self-pay | Admitting: Endocrinology

## 2017-02-22 NOTE — Telephone Encounter (Signed)
Pt states she could be using too much Klonopin, please advise   Her legs are shaking badly

## 2017-02-22 NOTE — Telephone Encounter (Signed)
Requested a call back from the patient to discuss.  

## 2017-02-24 ENCOUNTER — Other Ambulatory Visit: Payer: Self-pay | Admitting: Endocrinology

## 2017-03-02 ENCOUNTER — Other Ambulatory Visit: Payer: Self-pay | Admitting: Endocrinology

## 2017-03-03 ENCOUNTER — Telehealth: Payer: Self-pay | Admitting: Endocrinology

## 2017-03-03 NOTE — Telephone Encounter (Signed)
Pt called in and said that she is having some really bad cold symptoms, yellow mucus, and just continuous nose blowing.  She also went and bought some Mucinex yesterday and after reading the label she is concerned that she cannot take it with her health issues, she would like to know if this is safe.  She is just very concerned with what she can and cannot take.  Please advise.

## 2017-03-03 NOTE — Telephone Encounter (Signed)
mucinex is safe to take. If fever or sob, she should have ov here

## 2017-03-03 NOTE — Telephone Encounter (Signed)
Attempted to reach the patient. Patient did not have a working Engineer, technical sales. Will try again at a later time.

## 2017-03-03 NOTE — Telephone Encounter (Signed)
See message and please advise, Thanks!  

## 2017-03-04 NOTE — Telephone Encounter (Signed)
Attempted to reach the patient. Patient did not have a working voicemail. Will try again at a later time.  

## 2017-03-05 NOTE — Telephone Encounter (Signed)
Attempted to reach the patient. Patient did not have a working Engineer, technical sales.

## 2017-03-09 ENCOUNTER — Ambulatory Visit (INDEPENDENT_AMBULATORY_CARE_PROVIDER_SITE_OTHER): Payer: Medicare Other | Admitting: Podiatry

## 2017-03-09 DIAGNOSIS — M79676 Pain in unspecified toe(s): Secondary | ICD-10-CM

## 2017-03-09 DIAGNOSIS — Q828 Other specified congenital malformations of skin: Secondary | ICD-10-CM | POA: Diagnosis not present

## 2017-03-09 DIAGNOSIS — B351 Tinea unguium: Secondary | ICD-10-CM

## 2017-03-09 NOTE — Progress Notes (Signed)
Painful elongated toenails and multiple porokeratosis plantar aspect of the bilateral foot toes and tips of the toes.  Objective: Vital signs are stable she's alert and oriented 3 no open lesions or wounds are noted. Tenderness along thick yellow dystrophic mycotic and painful on palpation as well as debridement. She also has reactive keratomas distal aspect of the bilateral toes and of the plantar talus of the toes and plantar forefoot.  Assessment: Pigmented onychomycosis of porokeratosis.  Plan: Debridement of all hyperkeratotic tissue thoroughly today and debridement of all toenails 1 through 5 bilateral. Follow up with her in 3 months.

## 2017-03-15 DIAGNOSIS — M1811 Unilateral primary osteoarthritis of first carpometacarpal joint, right hand: Secondary | ICD-10-CM | POA: Diagnosis not present

## 2017-03-15 DIAGNOSIS — M1812 Unilateral primary osteoarthritis of first carpometacarpal joint, left hand: Secondary | ICD-10-CM | POA: Diagnosis not present

## 2017-03-23 ENCOUNTER — Encounter: Payer: Self-pay | Admitting: Endocrinology

## 2017-03-23 ENCOUNTER — Ambulatory Visit (INDEPENDENT_AMBULATORY_CARE_PROVIDER_SITE_OTHER): Payer: Medicare Other | Admitting: Endocrinology

## 2017-03-23 VITALS — BP 140/80 | HR 80 | Ht 62.0 in | Wt 117.0 lb

## 2017-03-23 DIAGNOSIS — E039 Hypothyroidism, unspecified: Secondary | ICD-10-CM | POA: Diagnosis not present

## 2017-03-23 DIAGNOSIS — I10 Essential (primary) hypertension: Secondary | ICD-10-CM

## 2017-03-23 DIAGNOSIS — M810 Age-related osteoporosis without current pathological fracture: Secondary | ICD-10-CM

## 2017-03-23 DIAGNOSIS — E119 Type 2 diabetes mellitus without complications: Secondary | ICD-10-CM | POA: Diagnosis not present

## 2017-03-23 DIAGNOSIS — R251 Tremor, unspecified: Secondary | ICD-10-CM

## 2017-03-23 DIAGNOSIS — R739 Hyperglycemia, unspecified: Secondary | ICD-10-CM

## 2017-03-23 LAB — HEMOGLOBIN A1C: Hgb A1c MFr Bld: 6.6 % — ABNORMAL HIGH (ref 4.6–6.5)

## 2017-03-23 LAB — TSH: TSH: 2.29 u[IU]/mL (ref 0.35–4.50)

## 2017-03-23 LAB — VITAMIN D 25 HYDROXY (VIT D DEFICIENCY, FRACTURES): VITD: 43.14 ng/mL (ref 30.00–100.00)

## 2017-03-23 MED ORDER — ALENDRONATE SODIUM 70 MG PO TABS: 70.0000 mg | ORAL_TABLET | ORAL | 11 refills | Status: DC

## 2017-03-23 NOTE — Patient Instructions (Addendum)
Try taking the clonazepam 3 times a day, as needed. Please continue the same metoprolol.  Please consider these measures for your health:  minimize alcohol.  Do not use tobacco products.  Have a colonoscopy at least every 10 years from age 74.  Women should have an annual mammogram from age 76.  Keep firearms safely stored.  Always use seat belts.  have working smoke alarms in your home.  See an eye doctor and dentist regularly.  Never drive under the influence of alcohol or drugs (including prescription drugs).  Those with fair skin should take precautions against the sun, and should carefully examine their skin once per month, for any new or changed moles. please let me know what your wishes would be, if artificial life support measures should become necessary.  It is critically important to prevent falling down (keep floor areas well-lit, dry, and free of loose objects.  If you have a cane, walker, or wheelchair, you should use it, even for short trips around the house.  Wear flat-soled shoes.  Also, try not to rush). good diet and exercise significantly improve your health.  please let me know if you wish to be referred to a dietician.  high blood sugar is very risky to your health.  you should see an eye doctor and dentist every year.  It is very important to get all recommended vaccinations.  I have sent a prescription to your pharmacy, for the osteoporosis. blood tests are requested for you today.  We'll let you know about the results. Please come back for a follow-up appointment in 6 months.

## 2017-03-23 NOTE — Progress Notes (Signed)
Subjective:    Patient ID: Laura [name], female    DOB: 08-27-1941, 76 y.o.   MRN: [id number]  HPI  The state of at least three ongoing medical problems is addressed today, with interval history of each noted here: Tremor: she says klonopin BID does not help.  She says it does not last the full 12 hrs. Denies falls HTN: denies sob.  Hyperglycemia: denies chest painever on rx for this.  Denies LOC   Past Medical History:  Diagnosis Date  . Anxiety state, unspecified 10/10/2007  . Arthritis   . ASYMPTOMATIC POSTMENOPAUSAL STATUS 04/15/2009  . Cataract    removed both eyes   . Deaf   . Dermatitis   . Headache(784.0) 08/27/2008  . HYPERLIPIDEMIA 06/22/2007   rx refused  . HYPERTENSION 05/07/2008  . HYPOTHYROIDISM 04/15/2009  . Mild mental retardation   . 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 RESTLESS LEG SYNDROME 10/01/2008  . TACHYCARDIA 08/27/2008  . TREMOR 11/21/2007  . TRIGGER FINGER 10/30/2009    Past Surgical History:  Procedure Laterality Date  . CATARACT EXTRACTION, BILATERAL    . COLONOSCOPY      Social History   Social History  . Marital status: Single    Spouse name: N/A  . Number of children: N/A  . Years of education: N/A   Occupational History  . Disabled Unemployed   Social History Main Topics  . Smoking status: Never Smoker  . Smokeless tobacco: Never Used  . Alcohol use No  . Drug use: No  . Sexual activity: Not on file   Other Topics Concern  . Not on file   Social History Narrative   Lives alone   Never married   Physical activity is very good   She has never had a driver's license   Does not have a phone at home     Current Outpatient Prescriptions on File Prior to Visit  Medication Sig Dispense Refill  . cholecalciferol (VITAMIN D) 1000 UNITS tablet Take 1,000 Units by mouth daily.    Marland Kitchen levothyroxine (SYNTHROID, LEVOTHROID) 50 MCG tablet Take 1 tablet (50 mcg total) by mouth daily. 90 tablet 1  . metoprolol tartrate (LOPRESSOR) 25  MG tablet TAKE ONE-HALF TABLET BY MOUTH TWICE DAILY 30 tablet 0  . Multiple Vitamin (MULTIVITAMIN) tablet Take 1 tablet by mouth daily.      Marland Kitchen triamcinolone cream (KENALOG) 0.1 % APPLY  CREAM EXTERNALLY THREE TIMES DAILY AS NEEDED FOR ITCHING 80 g 2  . vitamin E 100 UNIT capsule Take 100 Units by mouth 2 (two) times daily.      . clonazePAM (KLONOPIN) 0.5 MG tablet TAKE THREE TABLETS BY MOUTH TWICE DAILY AS NEEDED FOR ANXIETY OR  TREMOR (Patient not taking: Reported on 03/23/2017) 180 tablet 5   Current Facility-Administered Medications on File Prior to Visit  Medication Dose Route Frequency Provider Last Rate Last Dose  . 0.9 %  sodium chloride infusion  500 mL Intravenous Continuous Hilarie Fredrickson, MD        No Known Allergies  Family History  Problem Relation Age of Onset  . Thyroid disease Neg Hx   . Colon cancer Neg Hx   . Colon polyps Neg Hx   . Rectal cancer Neg Hx   . Stomach cancer Neg Hx     BP 140/80   Pulse 80   Ht  (1.575 m)   Wt 117 lb (53.1 kg)   SpO2 96%   BMI 21.40  kg/m    Review of Systems Denies LOC and leg swelling    Objective:   Physical Exam VITAL SIGNS:  See vs page GENERAL: no distress.  Gait: normal and steady. Neuro: slight fine tremor of the hands.    Lab Results  Component Value Date   HGBA1C 6.6 (H) 03/23/2017      Assessment & Plan:  Type 2 DM: new: I advised metformin Tremor: persistent. Osteoporosis: I advised rx. HTN: well-controlled.   I offered rx for metformin. Try taking the clonazepam 3 times a day, as needed. Please continue the same metoprolol. I have sent a prescription to your pharmacy, for fosamax.     Subjective:   Patient here for Medicare annual wellness visit and management of other chronic and acute problems.     Risk factors: advanced age    Roster of Physicians Providing Medical Care to Patient:  See "snapshot"   Activities of Daily Living: In your present state of health, do you have any  difficulty performing the following activities (lives alone)?:  Preparing food and eating?: No  Bathing yourself: No  Getting dressed: No  Using the toilet:No  Moving around from place to place: No  In the past year have you fallen or had a near fall?: No    Home Safety: Has smoke detector and wears seat belts. No firearms. No excess sun exposure.   Diet and Exercise  Current exercise habits: pt says good.   Dietary issues discussed: pt reports a healthy diet.    Depression Screen  Q1: Over the past two weeks, have you felt down, depressed or hopeless? no  Q2: Over the past two weeks, have you felt little interest or pleasure in doing things? no   The following portions of the patient's history were reviewed and updated as appropriate: allergies, current medications, past family history, past medical history, past social history, past surgical history and problem list.   Review of Systems  No change in chronic hearing loss; denies visual loss Objective:   Vision:  Sees optometrist Dr Sherryle Lis, so she declines [facility] today.   Hearing: grossly normal Body mass index:  See vs page Msk: pt easily and quickly performs "get-up-and-go" from a sitting position Cognitive Impairment Assessment: cognition, memory and judgment appear normal.  remembers 3/3 at 5 minutes.  excellent recall.  can easily read and write a sentence.  alert and oriented x 3   Assessment:   Medicare wellness utd on preventive parameters    Plan:   During the course of the visit the patient was educated and counseled about appropriate screening and preventive services including:       Fall prevention   Screening mammography  Bone densitometry screening  Diabetes screening  Nutrition counseling   Vaccines / LABS Zostavax / Pneumococcal Vaccine  today  PSA  Patient Instructions (the written plan) was given to the patient.

## 2017-03-24 ENCOUNTER — Encounter: Payer: Self-pay | Admitting: Endocrinology

## 2017-03-24 LAB — PTH, INTACT AND CALCIUM
Calcium: 8.6 mg/dL (ref 8.6–10.4)
PTH: 27 pg/mL (ref 14–64)

## 2017-03-30 ENCOUNTER — Ambulatory Visit: Payer: Self-pay | Admitting: Endocrinology

## 2017-04-06 ENCOUNTER — Emergency Department (HOSPITAL_COMMUNITY): Payer: Medicare Other

## 2017-04-06 ENCOUNTER — Inpatient Hospital Stay (HOSPITAL_COMMUNITY)
Admission: EM | Admit: 2017-04-06 | Discharge: 2017-04-13 | DRG: 871 | Disposition: A | Discharge status: Skilled Nursing Facility | Payer: Medicare Other | Attending: Internal Medicine | Admitting: Internal Medicine

## 2017-04-06 ENCOUNTER — Inpatient Hospital Stay (HOSPITAL_COMMUNITY): Payer: Medicare Other

## 2017-04-06 DIAGNOSIS — S0083XA Contusion of other part of head, initial encounter: Secondary | ICD-10-CM | POA: Diagnosis present

## 2017-04-06 DIAGNOSIS — F845 Asperger's syndrome: Secondary | ICD-10-CM | POA: Diagnosis present

## 2017-04-06 DIAGNOSIS — Z9181 History of falling: Secondary | ICD-10-CM

## 2017-04-06 DIAGNOSIS — W19XXXA Unspecified fall, initial encounter: Secondary | ICD-10-CM | POA: Diagnosis present

## 2017-04-06 DIAGNOSIS — R404 Transient alteration of awareness: Secondary | ICD-10-CM | POA: Diagnosis not present

## 2017-04-06 DIAGNOSIS — J690 Pneumonitis due to inhalation of food and vomit: Secondary | ICD-10-CM | POA: Diagnosis present

## 2017-04-06 DIAGNOSIS — I629 Nontraumatic intracranial hemorrhage, unspecified: Secondary | ICD-10-CM | POA: Diagnosis not present

## 2017-04-06 DIAGNOSIS — R739 Hyperglycemia, unspecified: Secondary | ICD-10-CM | POA: Diagnosis present

## 2017-04-06 DIAGNOSIS — H9190 Unspecified hearing loss, unspecified ear: Secondary | ICD-10-CM | POA: Diagnosis present

## 2017-04-06 DIAGNOSIS — G9340 Encephalopathy, unspecified: Secondary | ICD-10-CM | POA: Diagnosis present

## 2017-04-06 DIAGNOSIS — T1490XA Injury, unspecified, initial encounter: Secondary | ICD-10-CM | POA: Diagnosis not present

## 2017-04-06 DIAGNOSIS — G2581 Restless legs syndrome: Secondary | ICD-10-CM | POA: Diagnosis present

## 2017-04-06 DIAGNOSIS — E876 Hypokalemia: Secondary | ICD-10-CM | POA: Diagnosis present

## 2017-04-06 DIAGNOSIS — S065X0A Traumatic subdural hemorrhage without loss of consciousness, initial encounter: Secondary | ICD-10-CM | POA: Diagnosis not present

## 2017-04-06 DIAGNOSIS — I69391 Dysphagia following cerebral infarction: Secondary | ICD-10-CM | POA: Diagnosis not present

## 2017-04-06 DIAGNOSIS — Z5189 Encounter for other specified aftercare: Secondary | ICD-10-CM | POA: Diagnosis not present

## 2017-04-06 DIAGNOSIS — I69328 Other speech and language deficits following cerebral infarction: Secondary | ICD-10-CM | POA: Diagnosis not present

## 2017-04-06 DIAGNOSIS — I619 Nontraumatic intracerebral hemorrhage, unspecified: Secondary | ICD-10-CM | POA: Diagnosis not present

## 2017-04-06 DIAGNOSIS — S065XAA Traumatic subdural hemorrhage with loss of consciousness status unknown, initial encounter: Secondary | ICD-10-CM

## 2017-04-06 DIAGNOSIS — Y92129 Unspecified place in nursing home as the place of occurrence of the external cause: Secondary | ICD-10-CM

## 2017-04-06 DIAGNOSIS — R6889 Other general symptoms and signs: Secondary | ICD-10-CM | POA: Diagnosis not present

## 2017-04-06 DIAGNOSIS — S065X9A Traumatic subdural hemorrhage with loss of consciousness of unspecified duration, initial encounter: Secondary | ICD-10-CM | POA: Diagnosis present

## 2017-04-06 DIAGNOSIS — R4182 Altered mental status, unspecified: Secondary | ICD-10-CM | POA: Diagnosis not present

## 2017-04-06 DIAGNOSIS — E039 Hypothyroidism, unspecified: Secondary | ICD-10-CM | POA: Diagnosis not present

## 2017-04-06 DIAGNOSIS — G8191 Hemiplegia, unspecified affecting right dominant side: Secondary | ICD-10-CM | POA: Diagnosis present

## 2017-04-06 DIAGNOSIS — S0081XA Abrasion of other part of head, initial encounter: Secondary | ICD-10-CM | POA: Diagnosis present

## 2017-04-06 DIAGNOSIS — I10 Essential (primary) hypertension: Secondary | ICD-10-CM | POA: Diagnosis not present

## 2017-04-06 DIAGNOSIS — Z66 Do not resuscitate: Secondary | ICD-10-CM | POA: Diagnosis present

## 2017-04-06 DIAGNOSIS — R4189 Other symptoms and signs involving cognitive functions and awareness: Secondary | ICD-10-CM | POA: Diagnosis not present

## 2017-04-06 DIAGNOSIS — I612 Nontraumatic intracerebral hemorrhage in hemisphere, unspecified: Secondary | ICD-10-CM | POA: Diagnosis not present

## 2017-04-06 DIAGNOSIS — I639 Cerebral infarction, unspecified: Secondary | ICD-10-CM | POA: Diagnosis present

## 2017-04-06 DIAGNOSIS — R296 Repeated falls: Secondary | ICD-10-CM | POA: Diagnosis present

## 2017-04-06 DIAGNOSIS — J9690 Respiratory failure, unspecified, unspecified whether with hypoxia or hypercapnia: Secondary | ICD-10-CM | POA: Diagnosis not present

## 2017-04-06 DIAGNOSIS — A419 Sepsis, unspecified organism: Principal | ICD-10-CM

## 2017-04-06 DIAGNOSIS — F70 Mild intellectual disabilities: Secondary | ICD-10-CM | POA: Diagnosis present

## 2017-04-06 DIAGNOSIS — Z79899 Other long term (current) drug therapy: Secondary | ICD-10-CM | POA: Diagnosis not present

## 2017-04-06 DIAGNOSIS — S06359A Traumatic hemorrhage of left cerebrum with loss of consciousness of unspecified duration, initial encounter: Secondary | ICD-10-CM | POA: Diagnosis not present

## 2017-04-06 DIAGNOSIS — I6200 Nontraumatic subdural hemorrhage, unspecified: Secondary | ICD-10-CM

## 2017-04-06 DIAGNOSIS — M6281 Muscle weakness (generalized): Secondary | ICD-10-CM | POA: Diagnosis not present

## 2017-04-06 DIAGNOSIS — S199XXA Unspecified injury of neck, initial encounter: Secondary | ICD-10-CM | POA: Diagnosis not present

## 2017-04-06 DIAGNOSIS — R2689 Other abnormalities of gait and mobility: Secondary | ICD-10-CM | POA: Diagnosis not present

## 2017-04-06 DIAGNOSIS — S06360A Traumatic hemorrhage of cerebrum, unspecified, without loss of consciousness, initial encounter: Secondary | ICD-10-CM | POA: Diagnosis not present

## 2017-04-06 DIAGNOSIS — E785 Hyperlipidemia, unspecified: Secondary | ICD-10-CM | POA: Diagnosis present

## 2017-04-06 DIAGNOSIS — F411 Generalized anxiety disorder: Secondary | ICD-10-CM | POA: Diagnosis present

## 2017-04-06 DIAGNOSIS — S069X6D Unspecified intracranial injury with loss of consciousness greater than 24 hours without return to pre-existing conscious level with patient surviving, subsequent encounter: Secondary | ICD-10-CM | POA: Diagnosis not present

## 2017-04-06 DIAGNOSIS — Z7189 Other specified counseling: Secondary | ICD-10-CM | POA: Diagnosis not present

## 2017-04-06 LAB — URINALYSIS, ROUTINE W REFLEX MICROSCOPIC
Bilirubin Urine: NEGATIVE
GLUCOSE, UA: 50 mg/dL — AB
HGB URINE DIPSTICK: NEGATIVE
Ketones, ur: NEGATIVE mg/dL
Leukocytes, UA: NEGATIVE
Nitrite: NEGATIVE
Protein, ur: NEGATIVE mg/dL
SPECIFIC GRAVITY, URINE: 1.018 (ref 1.005–1.030)
pH: 7 (ref 5.0–8.0)

## 2017-04-06 LAB — PHOSPHORUS: Phosphorus: 2.7 mg/dL (ref 2.5–4.6)

## 2017-04-06 LAB — CK: Total CK: 85 U/L (ref 38–234)

## 2017-04-06 LAB — I-STAT CG4 LACTIC ACID, ED: Lactic Acid, Venous: 2.02 mmol/L (ref 0.5–1.9)

## 2017-04-06 LAB — I-STAT CHEM 8, ED
BUN: 16 mg/dL (ref 6–20)
Calcium, Ion: 0.95 mmol/L — ABNORMAL LOW (ref 1.15–1.40)
Chloride: 101 mmol/L (ref 101–111)
Creatinine, Ser: 0.5 mg/dL (ref 0.44–1.00)
GLUCOSE: 146 mg/dL — AB (ref 65–99)
HCT: 37 % (ref 36.0–46.0)
Hemoglobin: 12.6 g/dL (ref 12.0–15.0)
POTASSIUM: 3.4 mmol/L — AB (ref 3.5–5.1)
SODIUM: 137 mmol/L (ref 135–145)
TCO2: 24 mmol/L (ref 0–100)

## 2017-04-06 LAB — COMPREHENSIVE METABOLIC PANEL
ALT: 19 U/L (ref 14–54)
AST: 30 U/L (ref 15–41)
Albumin: 3.8 g/dL (ref 3.5–5.0)
Alkaline Phosphatase: 69 U/L (ref 38–126)
Anion gap: 9 (ref 5–15)
BILIRUBIN TOTAL: 0.7 mg/dL (ref 0.3–1.2)
BUN: 16 mg/dL (ref 6–20)
CO2: 25 mmol/L (ref 22–32)
CREATININE: 0.75 mg/dL (ref 0.44–1.00)
Calcium: 8.2 mg/dL — ABNORMAL LOW (ref 8.9–10.3)
Chloride: 98 mmol/L — ABNORMAL LOW (ref 101–111)
GFR calc Af Amer: >60 mL/min (ref >60)
Glucose, Bld: 167 mg/dL — ABNORMAL HIGH (ref 65–99)
Potassium: 3.8 mmol/L (ref 3.5–5.1)
Sodium: 132 mmol/L — ABNORMAL LOW (ref 135–145)
TOTAL PROTEIN: 7.1 g/dL (ref 6.5–8.1)

## 2017-04-06 LAB — CBC WITH DIFFERENTIAL/PLATELET
BASOS PCT: 0 %
Basophils Absolute: 0 10*3/uL (ref 0.0–0.1)
EOS ABS: 0 10*3/uL (ref 0.0–0.7)
Eosinophils Relative: 0 %
HCT: 38.4 % (ref 36.0–46.0)
HEMOGLOBIN: 12.5 g/dL (ref 12.0–15.0)
Lymphocytes Relative: 5 %
Lymphs Abs: 0.7 10*3/uL (ref 0.7–4.0)
MCH: 30.7 pg (ref 26.0–34.0)
MCHC: 32.6 g/dL (ref 30.0–36.0)
MCV: 94.3 fL (ref 78.0–100.0)
MONOS PCT: 9 %
Monocytes Absolute: 1.3 10*3/uL — ABNORMAL HIGH (ref 0.1–1.0)
NEUTROS ABS: 11.5 10*3/uL — AB (ref 1.7–7.7)
NEUTROS PCT: 86 %
Platelets: 235 10*3/uL (ref 150–400)
RBC: 4.07 MIL/uL (ref 3.87–5.11)
RDW: 12.1 % (ref 11.5–15.5)
WBC: 13.5 10*3/uL — AB (ref 4.0–10.5)

## 2017-04-06 LAB — I-STAT TROPONIN, ED: Troponin i, poc: 0 ng/mL (ref 0.00–0.08)

## 2017-04-06 LAB — PROTIME-INR
INR: 1.13
PROTHROMBIN TIME: 14.6 s (ref 11.4–15.2)

## 2017-04-06 LAB — MAGNESIUM: Magnesium: 1.7 mg/dL (ref 1.7–2.4)

## 2017-04-06 LAB — LACTIC ACID, PLASMA: LACTIC ACID, VENOUS: 2.7 mmol/L — AB (ref 0.5–1.9)

## 2017-04-06 LAB — BRAIN NATRIURETIC PEPTIDE: B Natriuretic Peptide: 135.6 pg/mL — ABNORMAL HIGH (ref 0.0–100.0)

## 2017-04-06 MED ORDER — METOCLOPRAMIDE HCL 5 MG/ML IJ SOLN
10.0000 mg | Freq: Once | INTRAMUSCULAR | Status: AC
  Administered 2017-04-06: 10 mg via INTRAVENOUS
  Filled 2017-04-06: qty 2

## 2017-04-06 MED ORDER — ACETAMINOPHEN 650 MG RE SUPP
650.0000 mg | Freq: Once | RECTAL | Status: AC
  Administered 2017-04-06: 650 mg via RECTAL
  Filled 2017-04-06: qty 1

## 2017-04-06 MED ORDER — ONDANSETRON HCL 4 MG/2ML IJ SOLN: 4.0000 mg | Freq: Three times a day (TID) | INTRAMUSCULAR | Status: DC | PRN

## 2017-04-06 MED ORDER — SODIUM CHLORIDE 0.9 % IV SOLN
INTRAVENOUS | Status: DC
  Administered 2017-04-06: 16:00:00 via INTRAVENOUS

## 2017-04-06 MED ORDER — SODIUM CHLORIDE 0.9 % IV SOLN
250.0000 mL | INTRAVENOUS | Status: DC | PRN
  Administered 2017-04-11: 250 mL via INTRAVENOUS

## 2017-04-06 MED ORDER — VANCOMYCIN HCL IN DEXTROSE 1-5 GM/200ML-% IV SOLN
1000.0000 mg | Freq: Once | INTRAVENOUS | Status: DC
  Filled 2017-04-06: qty 200

## 2017-04-06 MED ORDER — PIPERACILLIN-TAZOBACTAM 3.375 G IVPB: 3.3750 g | Freq: Three times a day (TID) | INTRAVENOUS | Status: DC

## 2017-04-06 MED ORDER — VANCOMYCIN HCL 500 MG IV SOLR: 500.0000 mg | Freq: Two times a day (BID) | INTRAVENOUS | Status: DC

## 2017-04-06 MED ORDER — SODIUM CHLORIDE 0.9 % IV SOLN
500.0000 mg | Freq: Two times a day (BID) | INTRAVENOUS | Status: DC
  Administered 2017-04-06 – 2017-04-12 (×12): 500 mg via INTRAVENOUS
  Filled 2017-04-06 (×14): qty 5

## 2017-04-06 MED ORDER — POTASSIUM CHLORIDE CRYS ER 20 MEQ PO TBCR: 20.0000 meq | EXTENDED_RELEASE_TABLET | ORAL | Status: DC

## 2017-04-06 MED ORDER — SODIUM CHLORIDE 0.9 % IV BOLUS (SEPSIS)
1000.0000 mL | Freq: Once | INTRAVENOUS | Status: AC
  Administered 2017-04-06: 1000 mL via INTRAVENOUS

## 2017-04-06 MED ORDER — SODIUM CHLORIDE 0.9 % IV BOLUS (SEPSIS): 500.0000 mL | Freq: Once | INTRAVENOUS | Status: DC

## 2017-04-06 MED ORDER — SODIUM CHLORIDE 0.9 % IV SOLN
3.0000 g | Freq: Three times a day (TID) | INTRAVENOUS | Status: DC
  Administered 2017-04-06 – 2017-04-12 (×17): 3 g via INTRAVENOUS
  Filled 2017-04-06 (×21): qty 3

## 2017-04-06 MED ORDER — SODIUM CHLORIDE 0.9 % IV BOLUS (SEPSIS)
250.0000 mL | Freq: Once | INTRAVENOUS | Status: AC
  Administered 2017-04-06: 250 mL via INTRAVENOUS

## 2017-04-06 MED ORDER — PIPERACILLIN-TAZOBACTAM 3.375 G IVPB 30 MIN
3.3750 g | Freq: Once | INTRAVENOUS | Status: AC
  Administered 2017-04-06: 3.375 g via INTRAVENOUS
  Filled 2017-04-06: qty 50

## 2017-04-06 MED ORDER — SODIUM CHLORIDE 0.9 % IV BOLUS (SEPSIS)
500.0000 mL | Freq: Once | INTRAVENOUS | Status: AC
  Administered 2017-04-06: 500 mL via INTRAVENOUS

## 2017-04-06 MED ORDER — LABETALOL HCL 5 MG/ML IV SOLN
5.0000 mg | Freq: Once | INTRAVENOUS | Status: DC
  Filled 2017-04-06: qty 4

## 2017-04-06 MED ORDER — POTASSIUM CHLORIDE IN NACL 40-0.9 MEQ/L-% IV SOLN
INTRAVENOUS | Status: DC
  Administered 2017-04-06 – 2017-04-10 (×4): 50 mL/h via INTRAVENOUS
  Filled 2017-04-06 (×7): qty 1000

## 2017-04-06 NOTE — ED Notes (Signed)
Sign Language Interpreter at bedside   

## 2017-04-06 NOTE — Consult Note (Signed)
CC:  Chief Complaint  Patient presents with  . Loss of Consciousness   HPI: Laura [name] is a 76 y.o. female who presented to the ER from [facility] assisted living after being found this morning at approximately 1100 seated upright in a chair covered in emesis and not responding to staff. Last normal known1700 yesterday. 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 On autism spectrum but reportedly high functioning and able to complete all ADLs independently. Present in room is Patient advocate, relative and interpreter. Patient does not open her eyes and therefore I am unable to get any history from her. History is limited to chart review and pt advocate. Advocate states typically dinner is around 1700. Yesterday she was at dinner and appeared normal. Every morning, staff checks in on residents of [facility] to ensure they are okay and they were unable to get a hold of her. They went upstairs and she was covered in emesis and still wearing her clothes from dinner. She states pt typically gets changed around 1900-2000 so they would assume something happened prior to them. Nursing present in room states patient was agitated and uncooperative during imaging tests. She would briefly open her eyes and quickly shut them. She is healthy otherwise and not on anti-coagulation. Of note: pt is found to be septic  PMH: Past Medical History:  Diagnosis Date  . Anxiety state, unspecified 10/10/2007  . Arthritis   . ASYMPTOMATIC POSTMENOPAUSAL STATUS 04/15/2009  . Cataract    removed both eyes   . Deaf   . Dermatitis   . Headache(784.0) 08/27/2008  . HYPERLIPIDEMIA 06/22/2007   rx refused  . HYPERTENSION 05/07/2008  . HYPOTHYROIDISM 04/15/2009  . Mild mental retardation   . 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 RESTLESS LEG SYNDROME 10/01/2008  . TACHYCARDIA 08/27/2008  . TREMOR 11/21/2007  . TRIGGER FINGER 10/30/2009    PSH: Past Surgical History:  Procedure Laterality  Date  . CATARACT EXTRACTION, BILATERAL    . COLONOSCOPY      SH: Social History  Substance Use Topics  . Smoking status: Never Smoker  . Smokeless tobacco: Never Used  . Alcohol use No    MEDS: Prior to Admission medications   Medication Sig Start Date End Date Taking? Authorizing Provider  alendronate (FOSAMAX) 70 MG tablet Take 1 tablet (70 mg total) by mouth once a week. Take with a full glass of water on an empty stomach. 03/23/17   Romero Belling, MD  cholecalciferol (VITAMIN D) 1000 UNITS tablet Take 1,000 Units by mouth daily.    [provider]  clonazePAM (KLONOPIN) 0.5 MG tablet TAKE THREE TABLETS BY MOUTH TWICE DAILY AS NEEDED FOR ANXIETY OR  TREMOR Patient not taking: Reported on 03/23/2017 03/02/17   Romero Belling, MD  levothyroxine (SYNTHROID, LEVOTHROID) 50 MCG tablet Take 1 tablet (50 mcg total) by mouth daily. 01/28/17   Romero Belling, MD  metoprolol tartrate (LOPRESSOR) 25 MG tablet TAKE ONE-HALF TABLET BY MOUTH TWICE DAILY 03/06/15   Romero Belling, MD  Multiple Vitamin (MULTIVITAMIN) tablet Take 1 tablet by mouth daily.      [provider]  triamcinolone cream (KENALOG) 0.1 % APPLY  CREAM EXTERNALLY THREE TIMES DAILY AS NEEDED FOR ITCHING 07/28/16   Romero Belling, MD  vitamin E 100 UNIT capsule Take 100 Units by mouth 2 (two) times daily.      [provider]    ALLERGY: No Known Allergies  ROS: Unable to obtain ROS  Vitals:   04/06/17 1315 04/06/17 1400  BP: (!) 169/81 (!) 154/71  Pulse: 89 97  Resp: 19   Temp:     General appearance: Laying on right side in fetal position. Eyes tightly closed.  Eyes: PERRL Cardiovascular: Regular rate and rhythm without murmurs, rubs, gallops. No edema or variciosities. Distal pulses normal. Pulmonary: Clear to auscultation Neurological Eyes tightly closed.  Responds to pain.  Unable to do remainder of exam due to patient keeping eyes closed and deafness  IMAGING: IMPRESSION: 1. Acute 2 x 4.1 cm  LEFT temporal lobe hematoma with surrounding subcentimeter intraparenchymal hemorrhages. 2. Acute LEFT holo hemispheric subdural hematoma with acute falcotentorial subdural hematoma measuring to 3 mm. 3. 7 mm LEFT-to-RIGHT midline shift without ventricle entrapment. 4. Small RIGHT parietal scalp hematoma, no skull fracture.  IMPRESSION/PLAN - 76 y.o. female who was brought to ER after being found covered in emesis and not responding to staff. Her CT head shows large temporal lobe hematoma and small SDH. She is also septic and is currently undergoing work up. It is difficult to determine her exact GCS because she is deaf and otherwise noncommunicative with the exception of sign language and also has autism. She does respond to pain and moves all extremities. I have discussed the case with Dr Conchita ParisNundkumar who has also seen and evaluated the patient. Although she has a large hematoma, it is unlikely that this hematoma would cause global deficits like this. Suspect sepsis is playing a large role in her current symptoms. Will start Keppra 500mg  BID x7days for seizure prophylaxis and repeat head CT tomorrow am for monitoring. We will see if being treated for sepsis will help improve her current state vs ICP monitoring.

## 2017-04-06 NOTE — H&P (Addendum)
PULMONARY / CRITICAL CARE MEDICINE   Name: Laura [name] MRN: [id number] DOB: [birth year]    ADMISSION DATE:  04/06/2017 CONSULTATION DATE:  04/06/17  REFERRING MD:  Dr. Armond Hang / EDP   CHIEF COMPLAINT:  Altered mental status   HISTORY OF PRESENT ILLNESS:   76 y/o F, SNF resident, with PMH of deafness, who presented to [facility] on 5/15 with altered mental status.  She lives at [facility] and was found around 1100 am in her chair covered in emesis.  She was last known normal at 1700 on 5/14.  CT of the head was evaluated which demonstrated left temporal lobe hematoma with surrounding intraparenchymal hemorrhages, acute left holo hemispheric subdural hematoma with acute falcotentorial subdural hematoma with L to R midline shift without ventricle entrapement, small right parietal scalp hematoma, no skull fracture.  She was evaluated by neurosurgery in the ER.  Initial labs- Na 131, K 3.4, glucose 146, BUN 16 / Sr Cr 0.50, lactic acid 2.02, WBC 13.5, hgb 12.5 and platelets 235.  CXR pending.   PCCM called for admission.    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  PAST SURGICAL HISTORY: She  has a past surgical history that includes Cataract extraction, bilateral and Colonoscopy.  No Known Allergies  Current Facility-Administered Medications on File Prior to Encounter  Medication  . 0.9 %  sodium chloride infusion   Current Outpatient Prescriptions on File Prior to Encounter  Medication Sig  . alendronate (FOSAMAX) 70 MG tablet Take 1 tablet (70 mg total) by  mouth once a week. Take with a full glass of water on an empty stomach.  . cholecalciferol (VITAMIN D) 1000 UNITS tablet Take 1,000 Units by mouth daily.  . clonazePAM (KLONOPIN) 0.5 MG tablet TAKE THREE TABLETS BY MOUTH TWICE DAILY AS NEEDED FOR ANXIETY OR  TREMOR (Patient not taking: Reported on 03/23/2017)  . levothyroxine (SYNTHROID, LEVOTHROID) 50 MCG tablet Take 1 tablet (50 mcg total) by mouth daily.  . metoprolol tartrate (LOPRESSOR) 25 MG tablet TAKE ONE-HALF TABLET BY MOUTH TWICE DAILY  . Multiple Vitamin (MULTIVITAMIN) tablet Take 1 tablet by mouth daily.    Marland Kitchen triamcinolone cream (KENALOG) 0.1 % APPLY  CREAM EXTERNALLY THREE TIMES DAILY AS NEEDED FOR ITCHING  . vitamin E 100 UNIT capsule Take 100 Units by mouth 2 (two) times daily.      FAMILY HISTORY:  Her indicated that her mother is deceased. She indicated that her father is deceased. She indicated that the status of her neg hx is unknown.    SOCIAL HISTORY: She  reports that she has never smoked. She has never used smokeless tobacco. She reports that she does not drink alcohol or use drugs.  REVIEW OF SYSTEMS:  Unable to complete as patient is altered   SUBJECTIVE:    VITAL SIGNS: BP (!) 143/82 (BP Location: Right Arm)   Pulse 85   Temp (!) 101 F (38.3 C) (Rectal)   Resp 19   Ht 5\' 2"  (1.575 m)   Wt 117 lb 1 oz (53.1 kg)   SpO2 98%   BMI 21.41 kg/m   HEMODYNAMICS:    VENTILATOR SETTINGS:    INTAKE / OUTPUT: No intake/output data recorded.  PHYSICAL EXAMINATION:  General: chronically ill appearing female in NAD lying in bed, covers over head HEENT: MM pink/moist, pupils 2-3 mm  Neuro: squeezes hand with interpreter commands via sign language, no other follow commands  CV: s1s2 rrr, no m/r/g PULM: even/non-labored, lungs bilaterally coarse  ZO:XWRU, non-tender, bsx4 active  Extremities: warm/dry, no edema  Skin: numerous scattered skin lesions of varying stages, scabs/scars (pt has anxiety disorder and  picks skin)   LABS:  BMET  Recent Labs Lab 04/06/17 1213 04/06/17 1228  NA 132* 137  K 3.8 3.4*  CL 98* 101  CO2 25  --   BUN 16 16  CREATININE 0.75 0.50  GLUCOSE 167* 146*    Electrolytes  Recent Labs Lab 04/06/17 1213  CALCIUM 8.2*    CBC  Recent Labs Lab 04/06/17 1213 04/06/17 1228  WBC 13.5*  --   HGB 12.5 12.6  HCT 38.4 37.0  PLT 235  --     Coag's  Recent Labs Lab 04/06/17 1213  INR 1.13    Sepsis Markers  Recent Labs Lab 04/06/17 1228  LATICACIDVEN 2.02*    ABG No results for input(s): PHART, PCO2ART, PO2ART in the last 168 hours.  Liver Enzymes  Recent Labs Lab 04/06/17 1213  AST 30  ALT 19  ALKPHOS 69  BILITOT 0.7  ALBUMIN 3.8    Cardiac Enzymes No results for input(s): TROPONINI, PROBNP in the last 168 hours.  Glucose No results for input(s): GLUCAP in the last 168 hours.  Imaging Ct Head Wo Contrast  Result Date: 04/06/2017 CLINICAL DATA:  Found covered and vomit at care facility, last seen normal at 1700 hours. RIGHT occipital hematoma. Recently admitted for sepsis. EXAM: CT HEAD WITHOUT CONTRAST TECHNIQUE: Contiguous axial images were obtained from the base of the skull through the vertex without intravenous contrast. COMPARISON:  CT HEAD November 12, 2008 and MRI of the head September 14, 2011 FINDINGS: BRAIN: 2 x 4.1 cm LEFT temporal lobe intraparenchymal hematoma with subcentimeter satellite hematomas. Low-density surrounding vasogenic edema. In addition, 7 mm dense LEFT holo hemispheric subdural hematoma. Acute bilateral cerebellar tentorial subdural hematomas measuring to 3 mm extending to the RIGHT interhemispheric fissure. 7 mm LEFT-to-RIGHT midline shift. Mild mass effect LEFT lateral ventricle without entrapment or hydrocephalus. Basal cisterns are patent. VASCULAR: Mild calcific atherosclerosis of the carotid siphons. SKULL: No skull fracture. Small RIGHT parietal scalp hematoma without subcutaneous gas or radiopaque  foreign bodies. SINUSES/ORBITS: Status post RIGHT status with RIGHT sphenoid sinus air-fluid level. Mild paranasal sinus mucosal thickening. Under pneumatized, well-aerated mastoid aircells. The included ocular globes and orbital contents are non-suspicious. Status post bilateral ocular lens implants. OTHER: None. IMPRESSION: 1. Acute 2 x 4.1 cm LEFT temporal lobe hematoma with surrounding subcentimeter intraparenchymal hemorrhages. 2. Acute LEFT holo hemispheric subdural hematoma with acute falcotentorial subdural hematoma measuring to 3 mm. 3. 7 mm LEFT-to-RIGHT midline shift without ventricle entrapment. 4. Small RIGHT parietal scalp hematoma, no skull fracture. Critical Value/emergent results were called by telephone at the time of interpretation on 04/06/2017 at 1:55 pm to Dr. Loren Racer , who verbally acknowledged these results. Electronically Signed   By: Awilda Metro M.D.   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 Traumatic injury.  PCCM called for ICU admit.   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 fall Hx Deafness, Mild Mental Deficit  P:   RASS goal: n/a Minimize sedating medications  Interpreter / Advocate at bedside  Place C-Collar until soft tissue injury can be ruled out  NSGY following, no intervention recommended  PULMONARY A: Rule Out Aspiration  P:    Assess CXR now  See ID O2 as needed to maintain saturations >92% Pulmonary hygiene    CARDIOVASCULAR A:  Hx HTN, HLD P:  Monitor hemodynamics in ICU  RENAL A:   Hypokalemia - mild Elevated Lactic Acid R/O Rhabdomyolysis   P:   Trend BMP / urinary output Replace electrolytes as indicated Avoid nephrotoxic agents, ensure adequate renal perfusion Assess CK > last seen normal 1700, ? How long she was immobile in her chair NS @ 40 ml/hr  GASTROINTESTINAL A:   Vomiting - suspect in setting of L CVA/hemorrhage  P:   PRN zofran  Aspiration precautions  NPO   HEMATOLOGIC A:   Mild Leukocytosis  P:  Monitor CBC   INFECTIOUS A:   Fever - anticipate related to L CVA/hemorrhage, rule out infectious etiology P:   Monitor fever curve / WBC trend  Empiric unasyn as above  Follow cultures as above  ENDOCRINE A:   Hypothyroidism  Hyperglycemia  P:   Assess TSH  Consider restart of synthroid in am 5/16 Monitor glucose on BMP   FAMILY  - Updates:  Family (cousin) & HCPOA (advocate) updated at bedside 5/15.   - Inter-disciplinary family meet or Palliative Care meeting due by: 5/22  CC Time:  30 minutes    Canary Brim, NP-C [facility] Pgr: [phone] or if no answer [phone] 04/06/2017, 2:54 PM  ,STAFF NOTE: I, Rory Percy, MD FACP have personally reviewed patient's available data, including medical history, events of note, physical examination and test results as part of my evaluation. I have discussed with resident/NP and other care providers such as pharmacist, RN and RRT. In addition, I personally evaluated patient and elicited key findings of: awakens, but does not open her eyes, moves lower ext, coughing well and protecting airway, lungs slight coarse, she was found in vomit and has fever, NO SIRS noted and no sepsis organ failure, she aspirated, I think she has fall ( frequent reported from med poa)and has scalp hematoma and ich  leading to found down and vomiting and aspiration pneumonitis, she does nOT have any tenderness on examination neck but cant clear  Clinically, will place collar, will need neck CT  And then CT in am of head, NS on board, will monitor for neuro q1h status, keep MAP 75 and less 105 as able, NPO, will dc sepsis protocol given no SIRS and brain edema risk with extensive fluid resus, change ABX to unasyn, dc zosyn and vanc, assess sputum sample, I updated med poa in room also had sign interpretors available in room The patient is critically ill with multiple organ systems failure and requires high complexity decision making for assessment and support, frequent evaluation and titration of therapies, application of advanced monitoring technologies and extensive  interpretation of multiple databases.   Critical Care Time devoted to patient care services described in this note is 30 Minutes. This time reflects time of care of this signee: Rory Percyaniel Caitlyne Ingham, MD FACP. This critical care time does not reflect procedure time, or teaching time or supervisory time of PA/NP/Med student/Med Resident etc but could involve care discussion time. Rest per NP/medical resident whose note is outlined above and that I agree with   Mcarthur Rossettianiel J. Tyson AliasFeinstein, MD, FACP Pgr: [phone] [facility] 04/06/2017 4:31 PM

## 2017-04-06 NOTE — ED Triage Notes (Signed)
[facility] after being found this morning at approximately 1100 seated upright in a chair covered in emesis.  Last seen normal at 1700 yesterday.  Responsive to pain, covered in vomit, with a hematoma to her right occipital.  Facility states she is normally alert, independent and physically active. Patient currently withdraws from interaction, trying to keep her face covered.  EMS reports patient is also deaf.

## 2017-04-06 NOTE — ED Provider Notes (Signed)
[facility]-EMERGENCY DEPT Provider Note   CSN: [id number] Arrival date & time: 04/06/17  1147  Level 5 caveat applies 2/2 ICH and sepsis.   History   Chief Complaint Chief Complaint  Patient presents with  . Loss of Consciousness    HPI Laura [name] is a 76 y.o. female.  The history is provided by the EMS personnel and a relative.  Altered Mental Status   This is a new problem. Episode onset: last seen normal yesterday evening. The problem has not changed since onset.Associated symptoms include confusion and somnolence. Associated symptoms comments: vomiting.    Past Medical History:  Diagnosis Date  . Anxiety state, unspecified 10/10/2007  . Arthritis   . ASYMPTOMATIC POSTMENOPAUSAL STATUS 04/15/2009  . Cataract    removed both eyes   . Deaf   . Dermatitis   . Headache(784.0) 08/27/2008  . HYPERLIPIDEMIA 06/22/2007   rx refused  . HYPERTENSION 05/07/2008  . HYPOTHYROIDISM 04/15/2009  . Mild mental retardation   . 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 RESTLESS LEG SYNDROME 10/01/2008  . TACHYCARDIA 08/27/2008  . TREMOR 11/21/2007  . TRIGGER FINGER 10/30/2009    Patient Active Problem List   Diagnosis Date Noted  . Osteoporosis 03/23/2017  . Menopause 03/25/2016  . Hyperglycemia 08/07/2015  . Contusion of left chest wall 02/05/2014  . Routine general medical examination at a health care facility 01/19/2014  . Left ventricular hypertrophy 07/15/2011  . Encounter for long-term (current) use of other medications 06/29/2011  . 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 TRIGGER FINGER 10/30/2009  . Hypothyroidism 04/15/2009  . ABNORMAL ELECTROCARDIOGRAM 04/15/2009  . RESTLESS LEG SYNDROME 10/01/2008  . HEADACHE 08/27/2008  . TACHYCARDIA 08/27/2008  . Essential hypertension 05/07/2008  . Abnormal involuntary movement 11/21/2007  . ANXIETY STATE, UNSPECIFIED 10/10/2007  . Dyslipidemia 06/22/2007    Past Surgical History:  Procedure Laterality Date  . CATARACT  EXTRACTION, BILATERAL    . COLONOSCOPY      OB History    No data available       Home Medications    Prior to Admission medications   Medication Sig Start Date End Date Taking? Authorizing Provider  alendronate (FOSAMAX) 70 MG tablet Take 1 tablet (70 mg total) by mouth once a week. Take with a full glass of water on an empty stomach. 03/23/17   Romero BellingEllison, Sean, MD  cholecalciferol (VITAMIN D) 1000 UNITS tablet Take 1,000 Units by mouth daily.    [provider]  clonazePAM (KLONOPIN) 0.5 MG tablet TAKE THREE TABLETS BY MOUTH TWICE DAILY AS NEEDED FOR ANXIETY OR  TREMOR Patient not taking: Reported on 03/23/2017 03/02/17   Romero BellingEllison, Sean, MD  levothyroxine (SYNTHROID, LEVOTHROID) 50 MCG tablet Take 1 tablet (50 mcg total) by mouth daily. 01/28/17   Romero BellingEllison, Sean, MD  metoprolol tartrate (LOPRESSOR) 25 MG tablet TAKE ONE-HALF TABLET BY MOUTH TWICE DAILY 03/06/15   Romero BellingEllison, Sean, MD  Multiple Vitamin (MULTIVITAMIN) tablet Take 1 tablet by mouth daily.      [provider]  triamcinolone cream (KENALOG) 0.1 % APPLY  CREAM EXTERNALLY THREE TIMES DAILY AS NEEDED FOR ITCHING 07/28/16   Romero BellingEllison, Sean, MD  vitamin E 100 UNIT capsule Take 100 Units by mouth 2 (two) times daily.      [provider]    Family History Family History  Problem Relation Age of Onset  . Thyroid disease Neg Hx   . Colon cancer Neg Hx   . Colon polyps Neg Hx   .  Rectal cancer Neg Hx   . Stomach cancer Neg Hx     Social History Social History  Substance Use Topics  . Smoking status: Never Smoker  . Smokeless tobacco: Never Used  . Alcohol use No     Allergies   Patient has no known allergies.   Review of Systems Review of Systems  Unable to perform ROS: Mental status change  Psychiatric/Behavioral: Positive for confusion.     Physical Exam Updated Vital Signs There were no vitals taken for this visit. ED Triage Vitals  Enc Vitals Group     BP 04/06/17 1200 (!) 174/96      Pulse Rate 04/06/17 1211 78     Resp 04/06/17 1211 (!) 26     Temp 04/06/17 1211 (!) 101 F (38.3 C)     Temp Source 04/06/17 1211 Rectal     SpO2 04/06/17 1211 100 %     Weight 04/06/17 1315 117 lb 1 oz (53.1 kg)     Height 04/06/17 1315 5\' 2"  (1.575 m)     Head Circumference --      Peak Flow --      Pain Score --      Pain Loc --      Pain Edu? --      Excl. in GC? --     Physical Exam  Constitutional:  Chronically ill appearing female holding covers over her eyes  HENT:  Head: Normocephalic and atraumatic.  Abrasion to left forehead  Eyes: EOM are normal. Pupils are equal, round, and reactive to light.  Neck: Normal range of motion. Neck supple.  Cardiovascular: Normal rate and regular rhythm.   No murmur heard. Pulmonary/Chest: Effort normal and breath sounds normal. No respiratory distress.  Abdominal: Soft. There is no tenderness.  Musculoskeletal: She exhibits no edema or deformity.  Neurological: She is alert. No cranial nerve deficit. GCS eye subscore is 4. GCS verbal subscore is 3. GCS motor subscore is 5.  Decreased strength along right upper and lower extr  Skin: Skin is warm and dry. She is not diaphoretic.  Psychiatric: She has a normal mood and affect.  Nursing note and vitals reviewed.    ED Treatments / Results  Labs (all labs ordered are listed, but only abnormal results are displayed) Labs Reviewed - No data to display  EKG  EKG Interpretation None       Radiology No results found.  Procedures Procedures (including critical care time)  Medications Ordered in ED Medications - No data to display   Initial Impression / Assessment and Plan / ED Course  I have reviewed the triage vital signs and the nursing notes.  Pertinent labs & imaging results that were available during my care of the patient were reviewed by me and considered in my medical decision making (see chart for details).     Patient is a 76 year old female with history of  deafness who presents with EMS after being found unresponsive, covered in vomit from her nursing home. Last seen normal around 5 PM last night. Upon arrival patient is febrile but otherwise hemodynamically stable, she is protecting her airway, possibly has some right-sided weakness. Head CT obtained with intracranial hemorrhage. Labs with leukocytosis along with elevated lactic acid. Patient will be covered with broad-spectrum antibiotics for concern of sepsis in the setting of intracranial hemorrhage. Neurosurgery consulted. Pt will be admitted to ICU for further management and evaluation. Will control bp to goal <160.  Final Clinical Impressions(s) / ED  Diagnoses   Final diagnoses:  None    New Prescriptions New Prescriptions   No medications on file     Marijean Niemann, MD 04/07/17 1610    Loren Racer, MD 04/07/17 1451

## 2017-04-06 NOTE — Progress Notes (Signed)
Upon arrival to unit, pt's lower abdomen firm to touch and pt had not emptied bladder since 1400 when ED staff bladder scanned and I/O catheterized (unknown output from that).  Bladder scanned at that time and scanner indicated that pt had >96299mL in bladder at that time. Upon speaking w/ eLink RN - CCMD made decision to place foley at this time r/t urinary retention and inability to void on own. Foley catheter was inserted at 2230 after order placed.  Will continue to monitor closely.  Francia GreavesSavannah R Kendyll Huettner, RN

## 2017-04-06 NOTE — Progress Notes (Signed)
Pharmacy Antibiotic Note  Laura [name] is a 76 y.o. female admitted on 04/06/2017 with sepsis.  Pharmacy has been consulted for vancomycin and zosyn dosing. Tmax is 101 and WBC is elevated at 13.5. Scr is WNL. Lactic acid is elevated at 2.02.   Plan: Vancomycin 1gm IV x 1 then 500mg  IV Q12H Zosyn 3.375gm IV Q8H (4 hr inf) F/u renal fxn, C&S, clinical status and trough at SS Height: 5\' 2"  (157.5 cm) Weight: 117 lb 1 oz (53.1 kg) IBW/kg (Calculated) : 50.1  Temp (24hrs), Avg:101 F (38.3 C), Min:101 F (38.3 C), Max:101 F (38.3 C)   Recent Labs Lab 04/06/17 1213 04/06/17 1228  WBC 13.5*  --   CREATININE 0.75 0.50  LATICACIDVEN  --  2.02*    Estimated Creatinine Clearance: 48.8 mL/min (by C-G formula based on SCr of 0.5 mg/dL).    No Known Allergies  Antimicrobials this admission: Vanc 5/15>> Zosyn 5/15>>  Dose adjustments this admission: N/A  Microbiology results: Pending  Thank you for allowing pharmacy to be a part of this patient's care.  ADDENDUM Pharmacy consult to dose Unasyn for aspiration pneumonia . D/c vanc, Zosyn Unasyn 3g IV Q8H   Laura [name], PharmD PGY1 Pharmacy Resident Rx ED [phone]#[id number] 04/06/2017 3:30 PM

## 2017-04-06 NOTE — ED Notes (Signed)
Patient not cooperative to allow portable chest xray.

## 2017-04-06 NOTE — ED Notes (Signed)
Pt to CT

## 2017-04-06 NOTE — Progress Notes (Signed)
Pharmacy Antibiotic Note  Laura [name] is a 76 y.o. female admitted on 04/06/2017 with sepsis.  Pharmacy has been consulted for vancomycin and zosyn dosing. Tmax is 101 and WBC is elevated at 13.5. Scr is WNL. Lactic acid is elevated at 2.02.   Plan: Vancomycin 1gm IV x 1 then 500mg  IV Q12H Zosyn 3.375gm IV Q8H (4 hr inf) F/u renal fxn, C&S, clinical status and trough at SS    Temp (24hrs), Avg:101 F (38.3 C), Min:101 F (38.3 C), Max:101 F (38.3 C)   Recent Labs Lab 04/06/17 1213 04/06/17 1228  WBC 13.5*  --   CREATININE  --  0.50  LATICACIDVEN  --  2.02*    CrCl cannot be calculated (Unknown ideal weight.).    No Known Allergies  Antimicrobials this admission: Vanc 5/15>> Zosyn 5/15>>  Dose adjustments this admission: N/A  Microbiology results: Pending  Thank you for allowing pharmacy to be a part of this patient's care.  Laura [name], Laura [name] 04/06/2017 1:01 PM

## 2017-04-06 NOTE — ED Notes (Signed)
Pt in and out cathed for urine sample. Tolerated well. Changed sheets. Pt resting comfortably at this time.

## 2017-04-07 ENCOUNTER — Inpatient Hospital Stay (HOSPITAL_COMMUNITY): Payer: Medicare Other

## 2017-04-07 ENCOUNTER — Encounter (HOSPITAL_COMMUNITY): Payer: Self-pay | Admitting: *Deleted

## 2017-04-07 DIAGNOSIS — S06359A Traumatic hemorrhage of left cerebrum with loss of consciousness of unspecified duration, initial encounter: Secondary | ICD-10-CM

## 2017-04-07 DIAGNOSIS — G9340 Encephalopathy, unspecified: Secondary | ICD-10-CM

## 2017-04-07 DIAGNOSIS — I639 Cerebral infarction, unspecified: Secondary | ICD-10-CM

## 2017-04-07 DIAGNOSIS — R4189 Other symptoms and signs involving cognitive functions and awareness: Secondary | ICD-10-CM

## 2017-04-07 DIAGNOSIS — Z7189 Other specified counseling: Secondary | ICD-10-CM

## 2017-04-07 LAB — CBC
HEMATOCRIT: 36.5 % (ref 36.0–46.0)
HEMOGLOBIN: 11.7 g/dL — AB (ref 12.0–15.0)
MCH: 30.9 pg (ref 26.0–34.0)
MCHC: 32.1 g/dL (ref 30.0–36.0)
MCV: 96.3 fL (ref 78.0–100.0)
Platelets: 197 10*3/uL (ref 150–400)
RBC: 3.79 MIL/uL — AB (ref 3.87–5.11)
RDW: 12.3 % (ref 11.5–15.5)
WBC: 13.3 10*3/uL — ABNORMAL HIGH (ref 4.0–10.5)

## 2017-04-07 LAB — MRSA PCR SCREENING: MRSA BY PCR: NEGATIVE

## 2017-04-07 LAB — BASIC METABOLIC PANEL
Anion gap: 6 (ref 5–15)
BUN: 10 mg/dL (ref 6–20)
CHLORIDE: 102 mmol/L (ref 101–111)
CO2: 25 mmol/L (ref 22–32)
Calcium: 7.5 mg/dL — ABNORMAL LOW (ref 8.9–10.3)
Creatinine, Ser: 0.61 mg/dL (ref 0.44–1.00)
GFR calc Af Amer: >60 mL/min (ref >60)
GFR calc non Af Amer: >60 mL/min (ref >60)
Glucose, Bld: 135 mg/dL — ABNORMAL HIGH (ref 65–99)
Potassium: 4 mmol/L (ref 3.5–5.1)
Sodium: 133 mmol/L — ABNORMAL LOW (ref 135–145)

## 2017-04-07 LAB — MAGNESIUM: MAGNESIUM: 1.7 mg/dL (ref 1.7–2.4)

## 2017-04-07 MED ORDER — ORAL CARE MOUTH RINSE
15.0000 mL | Freq: Two times a day (BID) | OROMUCOSAL | Status: DC
  Administered 2017-04-07 – 2017-04-13 (×12): 15 mL via OROMUCOSAL

## 2017-04-07 MED ORDER — METOPROLOL TARTRATE 5 MG/5ML IV SOLN
2.5000 mg | Freq: Four times a day (QID) | INTRAVENOUS | Status: DC | PRN
  Administered 2017-04-07 – 2017-04-08 (×3): 2.5 mg via INTRAVENOUS
  Filled 2017-04-07 (×3): qty 5

## 2017-04-07 MED ORDER — ACETAMINOPHEN 650 MG RE SUPP
650.0000 mg | Freq: Four times a day (QID) | RECTAL | Status: DC | PRN
  Administered 2017-04-07 – 2017-04-10 (×4): 650 mg via RECTAL
  Filled 2017-04-07 (×4): qty 1

## 2017-04-07 MED ORDER — LEVOTHYROXINE SODIUM 100 MCG IV SOLR
25.0000 ug | Freq: Every day | INTRAVENOUS | Status: DC
  Administered 2017-04-07 – 2017-04-10 (×4): 25 ug via INTRAVENOUS
  Filled 2017-04-07 (×4): qty 5

## 2017-04-07 MED ORDER — METOPROLOL TARTRATE 5 MG/5ML IV SOLN
2.5000 mg | Freq: Four times a day (QID) | INTRAVENOUS | Status: DC
  Administered 2017-04-07 – 2017-04-10 (×10): 2.5 mg via INTRAVENOUS
  Filled 2017-04-07 (×10): qty 5

## 2017-04-07 MED ORDER — HYDRALAZINE HCL 20 MG/ML IJ SOLN
10.0000 mg | INTRAMUSCULAR | Status: DC | PRN
  Administered 2017-04-07: 40 mg via INTRAVENOUS
  Administered 2017-04-08 – 2017-04-09 (×5): 20 mg via INTRAVENOUS
  Administered 2017-04-10 (×2): 40 mg via INTRAVENOUS
  Administered 2017-04-10: 20 mg via INTRAVENOUS
  Administered 2017-04-10: 40 mg via INTRAVENOUS
  Administered 2017-04-11 (×2): 20 mg via INTRAVENOUS
  Filled 2017-04-07: qty 2
  Filled 2017-04-07 (×7): qty 1
  Filled 2017-04-07: qty 2
  Filled 2017-04-07 (×2): qty 1
  Filled 2017-04-07: qty 2
  Filled 2017-04-07 (×2): qty 1

## 2017-04-07 NOTE — Consult Note (Addendum)
Neurology Consult Note  Reason for Consultation: Encephalopathy out of proportion to CT findings  Requesting provider: Karma Lew, MD  CC: Unable to obtain as the patient is encephalopathic and nonverbal  HPI: This is a 76 year old woman with history of autism and deafness who is now admitted with intracerebral hemorrhage. History is obtained from review of the patient's medical record as she is encephalopathic and unable to provide. Family is present at the bedside and offers additional information as needed.  According to notes, the patient lives at [facility] assisted living facility. She was found at 11:00 AM on 04/06/17 sitting in a chair covered in vomit. She had last been seen in her usual state of health at 1700 on 04/05/17. Facility staff called 911 and EMS arrived to find the patient responsive only to pain, covered and vomiting, with a hematoma over the right occipital scalp. She was transported to the [facility] emergency department. On arrival in the ED, she was found to have a temperature of 101F with blood pressure 174/96. She had a GCS score of 12. She was noted to have an abrasion to her left forehead with decreased strength in the right arm and leg. CT scan of the head was obtained and showed a left temporal lobe hematoma with left-sided subdural hematoma. Additional findings included peripheral leukocytosis with elevated lactic acid. She was placed on empiric antibiotics for presumed sepsis. Neurosurgical consultation was obtained and she was admitted to the ICU for further management.  Neurosurgery saw the patient and felt that her mental status may be more related to possible sepsis with low concern for significantly elevated intracranial hypertension. They deferred placement of ICP monitor, recommending that her exam be followed as her sepsis is treated. She was started on Keppra empirically with no report of any seizure activity to date. She was admitted by the  intensivist who felt that she did not have sepsis or SIRS. The patient's power of attorney indicated to the admitting physician that the patient has frequent falls. It was felt that her intracranial hemorrhage was likely the result of trauma with subsequent vomiting and aspiration pneumonitis. She has been maintained on Unasyn to cover for possible pneumonitis. She has been able to protect her airway and has not required intubation. She has remained very encephalopathic, essentially lying in bed with her eyes closed and not participating with any examination. Dr. Molli Knock is concerned that this mental status is out of proportion to the CT scan findings and has not consulted neurology for further recommendations.  PMH:  Past Medical History:  Diagnosis Date  . Anxiety state, unspecified 10/10/2007  . Arthritis   . ASYMPTOMATIC POSTMENOPAUSAL STATUS 04/15/2009  . Cataract    removed both eyes   . Deaf   . Dermatitis   . Headache(784.0) 08/27/2008  . HYPERLIPIDEMIA 06/22/2007   rx refused  . HYPERTENSION 05/07/2008  . HYPOTHYROIDISM 04/15/2009  . Mild mental retardation   . 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 RESTLESS LEG SYNDROME 10/01/2008  . TACHYCARDIA 08/27/2008  . TREMOR 11/21/2007  . TRIGGER FINGER 10/30/2009    PSH:  Past Surgical History:  Procedure Laterality Date  . CATARACT EXTRACTION, BILATERAL    . COLONOSCOPY      Family history: Family History  Problem Relation Age of Onset  . Thyroid disease Neg Hx   . Colon cancer Neg Hx   . Colon polyps Neg Hx   . Rectal cancer Neg Hx   . Stomach cancer Neg Hx  Social history:  Social History   Social History  . Marital status: Single    Spouse name: N/A  . Number of children: N/A  . Years of education: N/A   Occupational History  . Disabled Unemployed   Social History Main Topics  . Smoking status: Never Smoker  . Smokeless tobacco: Never Used  . Alcohol use No  . Drug use: No  . Sexual activity: Not on file    Other Topics Concern  . Not on file   Social History Narrative   Lives alone   Never married   Physical activity is very good   She has never had a driver's license   Does not have a phone at home      Current inpatient meds: Medications reviewed and reconciled.  Current Facility-Administered Medications  Medication Dose Route Frequency Provider Last Rate Last Dose  . 0.9 %  sodium chloride infusion  250 mL Intravenous PRN Ollis, Brandi L, NP      . 0.9 % NaCl with KCl 40 mEq / L  infusion   Intravenous Continuous Merwyn Katos, MD 50 mL/hr at 04/07/17 1414 50 mL/hr at 04/07/17 1414  . acetaminophen (TYLENOL) suppository 650 mg  650 mg Rectal Q6H PRN Canary Brim L, NP   650 mg at 04/07/17 1555  . Ampicillin-Sulbactam (UNASYN) 3 g in sodium chloride 0.9 % 100 mL IVPB  3 g Intravenous Q8H Nelda Bucks, MD 200 mL/hr at 04/07/17 1412 3 g at 04/07/17 1412  . hydrALAZINE (APRESOLINE) injection 10-40 mg  10-40 mg Intravenous Q4H PRN Canary Brim L, NP   20 mg at 04/07/17 1555  . levETIRAcetam (KEPPRA) 500 mg in sodium chloride 0.9 % 100 mL IVPB  500 mg Intravenous Q12H Alyson Ingles, PA-C 420 mL/hr at 04/07/17 1413 500 mg at 04/07/17 1413  . levothyroxine (SYNTHROID, LEVOTHROID) injection 25 mcg  25 mcg Intravenous Daily Ollis, Brandi L, NP   25 mcg at 04/07/17 1149  . MEDLINE mouth rinse  15 mL Mouth Rinse BID Nelda Bucks, MD   15 mL at 04/07/17 1000  . metoprolol tartrate (LOPRESSOR) injection 2.5 mg  2.5 mg Intravenous Q6H PRN Ollis, Brandi L, NP   2.5 mg at 04/07/17 1412  . metoprolol tartrate (LOPRESSOR) injection 2.5 mg  2.5 mg Intravenous Q6H Ollis, Brandi L, NP   2.5 mg at 04/07/17 1151  . ondansetron (ZOFRAN) injection 4 mg  4 mg Intravenous Q8H PRN Ollis, Brandi L, NP        Allergies: No Known Allergies  ROS: As per HPI. A full 14-point review of systems could not be obtained as the patient is encephalopathic and unable to provide.  PE:  BP (!)  106/45   Pulse 95   Temp (!) 101.7 F (38.7 C)   Resp (!) 34   Ht 5\' 2"  (1.575 m)   Wt 53 kg (116 lb 13.5 oz)   SpO2 97%   BMI 21.37 kg/m   General: WDWN, lying curled up on her left side in the ICU bed. She does not open her eyes with tactile stimulation. She does resist portions of the examination including passive range of motion and passive eye opening. She does not participate with examination, however, so this is largely limited to observation. She makes no attempts at vocalization.  HEENT: Right occipital scalp contusion present.cervical collar is in place. I was unable to visualize her oropharynx as she does not participate with the examination. Sclerae anicteric  with mild edema on the left. No conjunctival injection.  CV: Regular, no murmur. Carotid pulses full and symmetric, no bruits. Distal pulses 2+ and symmetric.  Lungs: Coarse upper airway sounds noted diffusely.  Abdomen: Soft, non-distended, no rebound or guarding. Bowel sounds present x4.  Extremities: No C/C/E. Neuro:  CN: Pupils are equal and round. They are symmetrically reactive from 3-->2 mm.  She resists passive eye opening with fair strength bilaterally. Her eyes appear conjugate. No forced deviation or nystagmus is noted. Corneal reflexes are present bilaterally, better on the left than the right. Her face appears grossly symmetric but is limited by her position and by the cervical collar. The remainder of her cranial nerve examination cannot be performed as she is unable to participate with the assessment.  Motor: Normal bulk, tone. She moves all 4 extremities spontaneously with relatively less movement of the right arm. Tone appears to be increased on the right, particularly in the right upper extremity. No abnormal movements are seen.  Sensation: She withdraws from noxious stimuli in the left arm and both lower extremities. Limited withdrawal is noted in the right upper extremity.  DTRs: 2+ on the left, 3+ on the  right. Toes upgoing bilaterally. Coordination/gait: These cannot be assessed as the patient does not participate with the examination due to encephalopathy.   Labs:  Lab Results  Component Value Date   WBC 13.3 (H) 04/07/2017   HGB 11.7 (L) 04/07/2017   HCT 36.5 04/07/2017   PLT 197 04/07/2017   GLUCOSE 135 (H) 04/07/2017   CHOL 202 (H) 09/08/2016   TRIG (H) 09/08/2016    414.0 Triglyceride is over 400; calculations on Lipids are invalid.   HDL 54.40 09/08/2016   LDLDIRECT 95.0 09/08/2016   ALT 19 04/06/2017   AST 30 04/06/2017   NA 133 (L) 04/07/2017   K 4.0 04/07/2017   CL 102 04/07/2017   CREATININE 0.61 04/07/2017   BUN 10 04/07/2017   CO2 25 04/07/2017   TSH 2.29 03/23/2017   INR 1.13 04/06/2017   HGBA1C 6.6 (H) 03/23/2017   Magnesium 1.7  Phosphorous 2.7  CK 85 Lactate 2.02-->2.7 Urinalysis notable for glucose of 50 Blood cultures with no growth at 1 day  Imaging:  I have personally and independently reviewed the CT scan of the head without contrast from today. This shows a large area of intraparenchymal hemorrhage in the left temporal lobe. This measures 4.3 x 2.2 x 4.4 cm with estimated volume of 22 cm. In addition, she has a subdural hematoma overlying the left convexity measuring about 6 mm at its thickest point, extending into the left tentorium. There appears to be mild edema throughout the left cerebral hemisphere. There is left to right shift measuring about 7 mm. Basilar cisterns are patent. There is no evidence of acute ischemia. There does appear to be some mass effect upon the lateral ventricles though no hydrocephalus is present. This scan was directly compared to the prior scan from 04/06/17. There's been a slight increase in the size of her temporal lobe hematoma with initial volume 19 cm. The amount of midline shift is essentially unchanged.  Assessment and Plan:  1. Acute ICH: This is most likely traumatic in etiology given history of frequent falls with  evidence of abrasion to her forehead and scalp hematoma in the occiput. I cannot completely exclude the possibility that this represents a primary hypertensive hemorrhage, however. Other less likely considerations include AVM, aneurysm, sympathomimetic abuse, and complication from anticoagulation or antiplatelet  therapy. This has remained stable on follow-up imaging today. PT/INR were normal on presentation. Urine drug screen was not sent. Monitor blood pressure, goal SBP 140-160 mmHg. Avoid antiplatelet agents and NSAIDs for1-2 weeks. Avoid therapeutic systemic anticoagulation for the next 3-4 weeks. Avoid fever and hyperglycemia as these are associated with worse neurologic prognosis. Avoid hypotonic IVF to minimize the risk of exacerbating cerebral edema. Frequent neuro checks.   ICH score: 1  2. Subdural hematoma: She has a small left-sided subdural hematoma. This is likely traumatic in nature as above. However, I cannot exclude the possibility of a primary hypertensive hemorrhage with extension into the subdural space. For now, this is not an operable lesion and would continue with supportive care. Avoid nonsteroidal anti-inflammatories, antiplatelet agents, and anticoagulation.  3. Acute encephalopathy: This is significant. She does have some purposeful withdrawal and spontaneous movements of the extremities but does not engage her participate with the examination. This is likely enlarged part the result of her intracerebral hemorrhage. If this is truly a traumatic injury, then there may be an element of postconcussive symptoms as well. 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 This would predispose her to encephalopathy and delirium from all causes. She is more likely to be significantly affected from insults that would be less impactful on a person with normal cerebral reserve, and it is also likely that she will take longer to recover from these insults as well. Unfortunately,  it is probable that she will have a new neurologic baseline after this is all over with but this can only be determined over time. For now, treatment is supportive. Continue to optimize metabolic status as you are. Continue to treat any underlying infection as you are. Limit CNS active medications, particularly benzodiazepines, opiates, and anything with strong anticholinergic properties. There is no evidence of seizure activity on her exam and no seizures have been reported. May consider EEG in a.m. depending upon her exam.  4. Right hemiparesis: She seems to have definite weakness in the right arm with possible weakness in the right leg. This would be consistent with her left sided intracranial hemorrhages. PT/OT/rehabilitation when able.  This was discussed with the patient's friend and cousin who are present at the bedside.her friend also called the patient's power of attorney who participate in the conversation via telephone. Education was provided on the diagnosis and expected evaluation and treatment. They are in agreement with the plan as noted. They were given the opportunity to ask any questions and these were addressed to their satisfaction.

## 2017-04-07 NOTE — Progress Notes (Signed)
PULMONARY / CRITICAL CARE MEDICINE   Name: Laura [name] MRN: [id number] DOB: 18-Jan-1941    ADMISSION DATE:  04/06/2017 CONSULTATION DATE:  04/06/17  REFERRING MD:  Dr. Armond Hang / EDP   CHIEF COMPLAINT:  Altered mental status   HISTORY OF PRESENT ILLNESS:   76 y/o F, SNF resident, with PMH of deafness, who presented to [facility] on 5/15 with altered mental status.  She lives at [facility] and was found around 1100 am in her chair covered in emesis.  She was last known normal at 1700 on 5/14.  CT of the head was evaluated which demonstrated left temporal lobe hematoma with surrounding intraparenchymal hemorrhages, acute left holo hemispheric subdural hematoma with acute falcotentorial subdural hematoma with L to R midline shift without ventricle entrapement, small right parietal scalp hematoma, no skull fracture.  She was evaluated by neurosurgery in the ER.  Initial labs- Na 131, K 3.4, glucose 146, BUN 16 / Sr Cr 0.50, lactic acid 2.02, WBC 13.5, hgb 12.5 and platelets 235.  CXR pending.   PCCM called for admission.   SUBJECTIVE:  RN reports SBP running > 170, no acute events overnight  VITAL SIGNS: BP 134/72   Pulse 87   Temp 99.9 F (37.7 C) (Axillary)   Resp 18   Ht 5\' 2"  (1.575 m)   Wt 116 lb 13.5 oz (53 kg)   SpO2 97%   BMI 21.37 kg/m   HEMODYNAMICS:    VENTILATOR SETTINGS:    INTAKE / OUTPUT: I/O last 3 completed shifts: In: 1004.2 [I.V.:594.2; IV Piggyback:410] Out: 1600 [Urine:1600]  PHYSICAL EXAMINATION: General: critically ill appearing female in NAD, lying in bed HEENT: MM pink/moist Neuro: does not wake to stimulation, C-collar in place, no response to sign language CV: s1s2 rrr, no m/r/g PULM: even/non-labored, lungs bilaterally coarse WG:NFAO, non-tender, bsx4 active  Extremities: warm/dry, no edema  Skin: multiple skin lesions/scabs of varying stages from baseline picking related to anxiety   LABS:  BMET  Recent Labs Lab 04/06/17 1213  04/06/17 1228 04/07/17 0316  NA 132* 137 133*  K 3.8 3.4* 4.0  CL 98* 101 102  CO2 25  --  25  BUN 16 16 10   CREATININE 0.75 0.50 0.61  GLUCOSE 167* 146* 135*    Electrolytes  Recent Labs Lab 04/06/17 1213 04/06/17 1819 04/07/17 0316  CALCIUM 8.2*  --  7.5*  MG  --  1.7 1.7  PHOS  --  2.7  --     CBC  Recent Labs Lab 04/06/17 1213 04/06/17 1228 04/07/17 0316  WBC 13.5*  --  13.3*  HGB 12.5 12.6 11.7*  HCT 38.4 37.0 36.5  PLT 235  --  197    Coag's  Recent Labs Lab 04/06/17 1213  INR 1.13    Sepsis Markers  Recent Labs Lab 04/06/17 1228 04/06/17 1819  LATICACIDVEN 2.02* 2.7*    ABG No results for input(s): PHART, PCO2ART, PO2ART in the last 168 hours.  Liver Enzymes  Recent Labs Lab 04/06/17 1213  AST 30  ALT 19  ALKPHOS 69  BILITOT 0.7  ALBUMIN 3.8    Cardiac Enzymes No results for input(s): TROPONINI, PROBNP in the last 168 hours.  Glucose No results for input(s): GLUCAP in the last 168 hours.  Imaging Ct Head Wo Contrast  Result Date: 04/07/2017 CLINICAL DATA:  Subdural hematoma follow-up EXAM: CT HEAD WITHOUT CONTRAST TECHNIQUE: Contiguous axial images were obtained from the base of the skull through the vertex without intravenous  contrast. COMPARISON:  Head CT 04/06/2017 FINDINGS: Brain: Intraparenchymal hematoma in the left temporal lobe measures 4.3 x 2.2 x 4.4 cm (volume = 22 cm^3), previously 4.1 x 2.0 x 4.4 cm (volume = 19 cm^3). Left convexity subdural hematoma is unchanged in thickness measuring 5-6 mm. Hematoma extends along the left leaflet of the cerebellar tentorium. There is mass effect on the lateral ventricles, but no hydrocephalus or ventricular trapping. There is a rightward midline shift measuring 7 mm, previously 6 mm. No new area of hemorrhage. Basal cisterns remain patent. No cerebellar tonsillar herniation. Vascular: No hyperdense vessel or unexpected calcification. Skull: Normal visualized skull base, calvarium  and extracranial soft tissues. Sinuses/Orbits: No sinus fluid levels or advanced mucosal thickening. No mastoid effusion. Normal orbits. IMPRESSION: 1. Slightly increased size of left temporal lobe intraparenchymal hematoma with very slight increase in rightward midline shift common of 7 mm. No new area of hemorrhage. 2. Unchanged size of left convexity subdural hematoma. 3. No hydrocephalus. Electronically Signed   By: Deatra RobinsonKevin  Herman M.D.   On: 04/07/2017 05:52   Ct Head Wo Contrast  Result Date: 04/06/2017 CLINICAL DATA:  Found covered and vomit at care facility, last seen normal at 1700 hours. RIGHT occipital hematoma. Recently admitted for sepsis. EXAM: CT HEAD WITHOUT CONTRAST TECHNIQUE: Contiguous axial images were obtained from the base of the skull through the vertex without intravenous contrast. COMPARISON:  CT HEAD November 12, 2008 and MRI of the head September 14, 2011 FINDINGS: BRAIN: 2 x 4.1 cm LEFT temporal lobe intraparenchymal hematoma with subcentimeter satellite hematomas. Low-density surrounding vasogenic edema. In addition, 7 mm dense LEFT holo hemispheric subdural hematoma. Acute bilateral cerebellar tentorial subdural hematomas measuring to 3 mm extending to the RIGHT interhemispheric fissure. 7 mm LEFT-to-RIGHT midline shift. Mild mass effect LEFT lateral ventricle without entrapment or hydrocephalus. Basal cisterns are patent. VASCULAR: Mild calcific atherosclerosis of the carotid siphons. SKULL: No skull fracture. Small RIGHT parietal scalp hematoma without subcutaneous gas or radiopaque foreign bodies. SINUSES/ORBITS: Status post RIGHT status with RIGHT sphenoid sinus air-fluid level. Mild paranasal sinus mucosal thickening. Under pneumatized, well-aerated mastoid aircells. The included ocular globes and orbital contents are non-suspicious. Status post bilateral ocular lens implants. OTHER: None. IMPRESSION: 1. Acute 2 x 4.1 cm LEFT temporal lobe hematoma with surrounding subcentimeter  intraparenchymal hemorrhages. 2. Acute LEFT holo hemispheric subdural hematoma with acute falcotentorial subdural hematoma measuring to 3 mm. 3. 7 mm LEFT-to-RIGHT midline shift without ventricle entrapment. 4. Small RIGHT parietal scalp hematoma, no skull fracture. Critical Value/emergent results were called by telephone at the time of interpretation on 04/06/2017 at 1:55 pm to Dr. Loren RacerAVID YELVERTON , who verbally acknowledged these results. Electronically Signed   By: Awilda Metroourtnay  Bloomer M.D.   On: 04/06/2017 14:06   Ct Cervical Spine Wo Contrast  Result Date: 04/06/2017 CLINICAL DATA:  Fall EXAM: CT CERVICAL SPINE WITHOUT CONTRAST TECHNIQUE: Multidetector CT imaging of the cervical spine was performed without intravenous contrast. Multiplanar CT image reconstructions were also generated. COMPARISON:  04/06/2017 FINDINGS: Alignment: Trace retrolisthesis of C4 on C5. Straightening of the cervical spine. Skull base and vertebrae: Craniovertebral junction is intact. There is no fracture identified. Mildly hypoplastic appearing C5 and C6 vertebral bodies with fusion. Soft tissues and spinal canal: No prevertebral fluid or swelling. No visible canal hematoma. Disc levels: Marked disc space narrowing, endplate change and osteophyte at C4-C5 with posterior disc osteophyte complex and mild canal stenosis. Moderate to marked bilateral foraminal narrowing at this level. Moderate narrowing at C6-C7 with  moderate foraminal narrowing. Upper chest: Negative. Other: None IMPRESSION: 1. No definite acute osseous abnormality 2. 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 Moderate severe degenerative disc changes at C4-C5 and C6-C7. Electronically Signed   By: Jasmine Pang M.D.   On: 04/06/2017 17:43   Dg Chest Port 1 View  Result Date: 04/07/2017 CLINICAL DATA:  Followup respiratory failure. EXAM: PORTABLE CHEST 1 VIEW COMPARISON:  04/06/2017 FINDINGS: Chronic cardiomegaly with left  ventricular prominence. Chronic aortic atherosclerosis. Patchy density in both lower lobes consistent with mild atelectasis/ pneumonia. Upper lungs are clear. No evidence heart failure or effusion. No acute bone finding. IMPRESSION: Cardiomegaly. Persistent patchy lower lobe density consistent with atelectasis or mild pneumonia. Electronically Signed   By: Paulina Fusi M.D.   On: 04/07/2017 07:25   Dg Chest Port 1 View  Result Date: 04/06/2017 CLINICAL DATA:  Sepsis, emesis and unresponsive. EXAM: PORTABLE CHEST 1 VIEW COMPARISON:  02/05/2014 CXR FINDINGS: Cardiomegaly with aortic atherosclerosis. Right basilar atelectasis and/or scarring. No pneumonic consolidation, CHF nor effusion. No pneumothorax. No acute nor suspicious osseous abnormality. IMPRESSION: Cardiomegaly with aortic atherosclerosis. Right basilar atelectasis and/or scarring. Otherwise negative exam. Electronically Signed   By: Tollie Eth M.D.   On: 04/06/2017 22:17     STUDIES:  CT Head 5/15 >> left temporal lobe hematoma with surrounding intraparenchymal hemorrhages, acute left holo hemispheric subdural hematoma with acute falcotentorial subdural hematoma with L to R midline shift without ventricle entrapement, small right parietal scalp hematoma, no skull fracture CT Neck 5/15 >> no acute osseous abnormality CT Head 5/16 >> slightly increased size of left temporal lobe intraparenchymal hematoma with very slight increase in rightward midline shift shift of 7 mm, no new area of hemorrhage, unchanged size of left convexity subdural hematoma, no hydrocephalus   CULTURES: BCx2 5/15 >>   ANTIBIOTICS: Vanco & Zosyn x1 in ER  Unasyn 5/15 >>   SIGNIFICANT EVENTS: 5/15  Admit with AMS, CT head positive for L CVA   LINES/TUBES:   DISCUSSION: 76 y/o F, ALF resident, 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 Traumatic injury.  PCCM called for ICU admit.   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 fall Hx Deafness, Mild Mental Deficit / Asperger's - pt at baseline lives in an ALF, able to go with the group to the grocery store / buy groceries, does not recognize social que's, functions at an the level of ~ 76 year old  P:   RASS goal: n/a Minimize sedating medications  SBP Goal <160 per NSGY  Continue C-collar until soft tissue injury can be ruled out  NSGY following, appreciate input Hold home klonopin > was PRN for tremor  Neurology consulted, appreciate input   PULMONARY A: Rule Out Aspiration - bilateral lower lobe atelectasis  P:   Follow intermittent CXR See ID  O2 as needed to support sats >92% Pulmonary hygiene - mobilize, upright positioning    CARDIOVASCULAR A:  Hx HTN, HLD P:  Monitor hemodynamics in ICU PRN lopressor 2.5 mg IV Q6 for SBP >160  RENAL A:   Hypokalemia - mild Elevated Lactic Acid Rhabdomyolysis ruled out P:   Trend BMP / urinary output Replace electrolytes as indicated Avoid nephrotoxic agents, ensure adequate renal perfusion NS with 40 mEq of KCL at 62ml/hr  GASTROINTESTINAL A:   Vomiting - suspect in setting of L CVA/hemorrhage  P:  PRN zofran  Aspiration precautions  NPO  HEMATOLOGIC A:   Mild Leukocytosis  P:  Monitor CBC / WBC trend   INFECTIOUS A:   Fever - anticipate related to L CVA/hemorrhage, rule out infectious etiology P:   Empiric Unasyn as above, D2/x  Follow cultures, PCT to narrow abx  ENDOCRINE A:   Hypothyroidism - TSH wnl on admit Hyperglycemia  P:   Resume synthroid IV for now  Monitor glucose on BMP   FAMILY  - Updates:  HCPOA [name]) updated at bedside 5/16.  Reviewed possibility of poor airway protection with the HCPOA and she feels the patient would not tolerate vent support.  She also feels CPR is not in her best interest.  Pt DNR/DNI.  Continue full medical support for now.     - Inter-disciplinary family meet or Palliative Care meeting  due by: 5/22  CC Time:  30 minutes    Canary Brim, NP-C [facility] Pgr: [phone] or if no answer [phone] 04/07/2017, 9:48 AM  Attending Note:  76 year old female with autism and deaf who had a traumatic brain injury with a SDH after a fall.  Patient is protecting her airway with a c-collar in place on exam.  I reviewed CXR myself, no acute findings noted.  Spoke with DPOA who after discussion informed us that patient would not want nor be able to understand intubation and advanced life support.  Based on discussion will make DNR.  Spoke with NS, ICP monitor is unlikely to be revealing.  Will give time for patient to declare herself as her CT findings are not compatible with exam.  Will consult neurology and likely order an EEG.  The patient is critically ill with multiple organ systems failure and requires high complexity decision making for assessment and support, frequent evaluation and titration of therapies, application of advanced monitoring technologies and extensive interpretation of multiple databases.   Critical Care Time devoted to patient care services described in this note is  35  Minutes. This time reflects time of care of this signee Dr Koren Bound. This critical care time does not reflect procedure time, or teaching time or supervisory time of PA/NP/Med student/Med Resident etc but could involve care discussion time.  Alyson Reedy, M.D. [facility] Pulmonary/Critical Care Medicine. Pager: [phone]. After hours pager: [phone].

## 2017-04-07 NOTE — Evaluation (Signed)
Clinical/Bedside Swallow Evaluation Patient Details  Name: Laura [name] MRN: [id number] Date of Birth: 06/08/1941  Today's Date: 04/07/2017 Time: SLP Start Time (ACUTE ONLY): 1205 SLP Stop Time (ACUTE ONLY): 1212 SLP Time Calculation (min) (ACUTE ONLY): 7 min  Past Medical History:  Past Medical History:  Diagnosis Date  . Anxiety state, unspecified 10/10/2007  . Arthritis   . ASYMPTOMATIC POSTMENOPAUSAL STATUS 04/15/2009  . Cataract    removed both eyes   . Deaf   . Dermatitis   . Headache(784.0) 08/27/2008  . HYPERLIPIDEMIA 06/22/2007   rx refused  . HYPERTENSION 05/07/2008  . HYPOTHYROIDISM 04/15/2009  . Mild mental retardation   . 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 RESTLESS LEG SYNDROME 10/01/2008  . TACHYCARDIA 08/27/2008  . TREMOR 11/21/2007  . TRIGGER FINGER 10/30/2009   Past Surgical History:  Past Surgical History:  Procedure Laterality Date  . CATARACT EXTRACTION, BILATERAL    . COLONOSCOPY     HPI:  Pt is a 76 y/o F admitted from ALF with AMS, found to have L temporal lobe hematoma. PMH includes deafness (uses ASL), asperger's, cataracts, arthritis, HTN, HLD, anxiety   Assessment / Plan / Recommendation Clinical Impression  Pt is not alert or participatory for PO trials. She had minimal labial movement to oral care via suction, but no further interaction despite multimodal stimulation. Recommend to remain NPO pending any improvements in mentation. SLP Visit Diagnosis: Dysphagia, unspecified (R13.10)    Aspiration Risk  Moderate aspiration risk    Diet Recommendation NPO   Medication Administration: Via alternative means    Other  Recommendations Oral Care Recommendations: Oral care QID Other Recommendations: Have oral suction available   Follow up Recommendations  (tba)      Frequency and Duration min 2x/week  2 weeks       Prognosis Prognosis for Safe Diet Advancement: Fair Barriers to Reach Goals: Cognitive deficits      Swallow Study    General HPI: Pt is a 76 y/o F admitted from ALF with AMS, found to have L temporal lobe hematoma. PMH includes deafness (uses ASL), asperger's, cataracts, arthritis, HTN, HLD, anxiety Type of Study: Bedside Swallow Evaluation Previous Swallow Assessment: none in chart Diet Prior to this Study: NPO Temperature Spikes Noted: Yes Respiratory Status: Room air History of Recent Intubation: No Behavior/Cognition: Lethargic/Drowsy Oral Cavity Assessment: Other (comment) (unable to visualize well) Oral Care Completed by SLP: Yes Oral Cavity - Dentition: Other (Comment) (difficult to visualize) Vision:  (doesn't open her eyes) Patient Positioning: Upright in bed Baseline Vocal Quality: Not observed Volitional Cough: Cognitively unable to elicit Volitional Swallow: Unable to elicit    Oral/Motor/Sensory Function     Ice Chips Ice chips: Not tested   Thin Liquid Thin Liquid: Not tested    Nectar Thick Nectar Thick Liquid: Not tested   Honey Thick Honey Thick Liquid: Not tested   Puree Puree: Not tested   Solid   GO   Solid: Not tested        [name], Lurdes Haltiwanger 04/07/2017,2:16 PM  [name], M.A. CCC-SLP [phone]

## 2017-04-07 NOTE — Progress Notes (Signed)
BPs staying 160 systolic. Temp 101.7. NP Ollis notified, orders received.

## 2017-04-07 NOTE — Progress Notes (Signed)
No issues overnight. Pt remains largely unresponsive (is deaf at baseline, communicates by signing, has autism spectrum disorder)  EXAM:  BP 105/80 (BP Location: Left Leg)   Pulse 86   Temp 99 F (37.2 C) (Axillary)   Resp (!) 30   Ht 5\' 2"  (1.575 m)   Wt 53 kg (116 lb 13.5 oz)   SpO2 97%   BMI 21.37 kg/m   Somnolent, not arousable to voice Not Following commands Does move spontaneously all extremities, does not follow commands  IMAGING: Repeat CTH today demonstrates essentially stable left temporal hemorrhage. The basal cisterns are widely patent, no HCP. Minimal MLS. No significant sulcal effacement over the convexities.   IMPRESSION:  76 y.o. female with likely traumatic left temporal contusion/small SDH. Based on CT, I do not suspect she has elevated ICP. PCCM does not feel she has SIRS/Sepsis to explain her encephalopathy. It certainly is possible that the temporal contusion in this patient with autism spectrum disorder has resulted in a much more significant encephalopathic picture than expected.   PLAN: - At this point I still do no believe that ICP monitor is going to be of much benefit. - May consider an MRI brain

## 2017-04-07 NOTE — Progress Notes (Signed)
BPs running 170s systolic. NP Ollis paged, orders received.

## 2017-04-08 ENCOUNTER — Inpatient Hospital Stay (HOSPITAL_COMMUNITY): Payer: Medicare Other

## 2017-04-08 DIAGNOSIS — S069X6D Unspecified intracranial injury with loss of consciousness greater than 24 hours without return to pre-existing conscious level with patient surviving, subsequent encounter: Secondary | ICD-10-CM

## 2017-04-08 LAB — CBC
HEMATOCRIT: 35.2 % — AB (ref 36.0–46.0)
Hemoglobin: 11.2 g/dL — ABNORMAL LOW (ref 12.0–15.0)
MCH: 30.9 pg (ref 26.0–34.0)
MCHC: 31.8 g/dL (ref 30.0–36.0)
MCV: 97 fL (ref 78.0–100.0)
PLATELETS: 205 10*3/uL (ref 150–400)
RBC: 3.63 MIL/uL — AB (ref 3.87–5.11)
RDW: 12.4 % (ref 11.5–15.5)
WBC: 14.7 10*3/uL — AB (ref 4.0–10.5)

## 2017-04-08 LAB — BASIC METABOLIC PANEL
ANION GAP: 8 (ref 5–15)
BUN: 14 mg/dL (ref 6–20)
CALCIUM: 7.8 mg/dL — AB (ref 8.9–10.3)
CHLORIDE: 103 mmol/L (ref 101–111)
CO2: 23 mmol/L (ref 22–32)
Creatinine, Ser: 0.61 mg/dL (ref 0.44–1.00)
GFR calc non Af Amer: >60 mL/min (ref >60)
Glucose, Bld: 167 mg/dL — ABNORMAL HIGH (ref 65–99)
Potassium: 3.9 mmol/L (ref 3.5–5.1)
SODIUM: 134 mmol/L — AB (ref 135–145)

## 2017-04-08 LAB — MAGNESIUM: MAGNESIUM: 1.8 mg/dL (ref 1.7–2.4)

## 2017-04-08 MED ORDER — MAGNESIUM SULFATE 2 GM/50ML IV SOLN
2.0000 g | Freq: Once | INTRAVENOUS | Status: AC
  Administered 2017-04-08: 2 g via INTRAVENOUS
  Filled 2017-04-08: qty 50

## 2017-04-08 NOTE — Progress Notes (Signed)
  Speech Language Pathology Treatment: Dysphagia  Patient Details Name: Laura [name] MRN: [id number] DOB: 01-09-1941 Today's Date: 04/08/2017 Time: [id number] SLP Time Calculation (min) (ACUTE ONLY): 15 min  Assessment / Plan / Recommendation Clinical Impression  Pt is a little more alert today although she still keeps her eyes closed. She seems to show some awareness as SLP would fingerspell words into her hand - especially when SLP signed the pt's caregiver's name. Pt still did not orally accept any PO trials, although she did grab the spoon with her hand and push it away. Continue to recommend that she remain NPO pending improvements in mentation.    HPI HPI: Pt is a 76 y/o F admitted from ALF with AMS, found to have L temporal lobe hematoma. PMH includes deafness (uses ASL), asperger's, cataracts, arthritis, HTN, HLD, anxiety      SLP Plan  Continue with current plan of care       Recommendations  Diet recommendations: NPO Medication Administration: Via alternative means                Oral Care Recommendations: Oral care QID Follow up Recommendations:  (tba) SLP Visit Diagnosis: Dysphagia, unspecified (R13.10) Plan: Continue with current plan of care       GO                [name], Tashanti Dalporto 04/08/2017, 9:26 AM  [name], M.A. CCC-SLP [phone]

## 2017-04-08 NOTE — Progress Notes (Signed)
Subjective: No significant changes overnight. Patient remains controlled up on her right side in the ICU bed. When attempting to visualize pupils patient closes his eyes very tightly and does not like to be moved.  Objective: Current vital signs: BP (!) 158/57   Pulse 91   Temp 98 F (36.7 C) (Oral)   Resp (!) 28   Ht 5\' 2"  (1.575 m)   Wt 52.6 kg (115 lb 15.4 oz)   SpO2 98%   BMI 21.21 kg/m  Vital signs in last 24 hours: Temp:  [97.6 F (36.4 C)-101.7 F (38.7 C)] 98 F (36.7 C) (05/17 0844) Pulse Rate:  [65-109] 91 (05/17 0805) Resp:  [13-34] 28 (05/17 0805) BP: (105-174)/(45-131) 158/57 (05/17 0805) SpO2:  [87 %-100 %] 98 % (05/17 0805) Weight:  [52.6 kg (115 lb 15.4 oz)] 52.6 kg (115 lb 15.4 oz) (05/17 0500)  Intake/Output from previous day: 05/16 0701 - 05/17 0700 In: 1710 [I.V.:1200; IV Piggyback:510] Out: 1020 [Urine:1020] Intake/Output this shift: No intake/output data recorded. Nutritional status: Diet NPO time specified  ROS:                                                                                                                                       History obtained from unobtainable from patient due to mental status     Neurologic Exam: CN: Pupils are equal and round--although difficult to visualize at times as patient clenches eyelids tightly. They are symmetrically reactive from 3-->2 mm.  She resists passive eye opening with fair strength bilaterally. Her eyes appear conjugate. No forced deviation or nystagmus is noted.  Her face appears grossly symmetric but is limited by her position and by the cervical collar. The remainder of her cranial nerve examination cannot be performed as she is unable to participate with the assessment.  Motor: Normal bulk, tone. She moves all 4 extremities spontaneously with relatively less movement of the right arm. Tone appears to be increased on the right, particularly in the right upper extremity. No abnormal movements  are seen. Patient remains [place] fetal position however she is on the right side today Sensation: She withdraws from noxious stimuli in the left arm and both lower extremities. Limited withdrawal is noted in the right upper extremity.  DTRs: 2+ on the left, 3+ on the right. Toes downgoing bilaterally. Coordination/gait: These cannot be assessed as the patient does not participate with the examination due to encephalopathy.    Lab Results: Basic Metabolic Panel:  Recent Labs Lab 04/06/17 1213 04/06/17 1228 04/06/17 1819 04/07/17 0316 04/08/17 0222  NA 132* 137  --  133* 134*  K 3.8 3.4*  --  4.0 3.9  CL 98* 101  --  102 103  CO2 25  --   --  25 23  GLUCOSE 167* 146*  --  135* 167*  BUN 16 16  --  10 14  CREATININE 0.75 0.50  --  0.61 0.61  CALCIUM 8.2*  --   --  7.5* 7.8*  MG  --   --  1.7 1.7 1.8  PHOS  --   --  2.7  --   --     Liver Function Tests:  Recent Labs Lab 04/06/17 1213  AST 30  ALT 19  ALKPHOS 69  BILITOT 0.7  PROT 7.1  ALBUMIN 3.8   No results for input(s): LIPASE, AMYLASE in the last 168 hours. No results for input(s): AMMONIA in the last 168 hours.  CBC:  Recent Labs Lab 04/06/17 1213 04/06/17 1228 04/07/17 0316 04/08/17 0222  WBC 13.5*  --  13.3* 14.7*  NEUTROABS 11.5*  --   --   --   HGB 12.5 12.6 11.7* 11.2*  HCT 38.4 37.0 36.5 35.2*  MCV 94.3  --  96.3 97.0  PLT 235  --  197 205    Cardiac Enzymes:  Recent Labs Lab 04/06/17 1819  CKTOTAL 85    Lipid Panel: No results for input(s): CHOL, TRIG, HDL, CHOLHDL, VLDL, LDLCALC in the last 168 hours.  CBG: No results for input(s): GLUCAP in the last 168 hours.  Microbiology: Results for orders placed or performed during the hospital encounter of 04/06/17  Culture, blood (Routine X 2) w Reflex to ID Panel     Status: None (Preliminary result)   Collection Time: 04/06/17  2:15 PM  Result Value Ref Range Status   Specimen Description BLOOD LEFT ANTECUBITAL  Final   Special  Requests   Final    BOTTLES DRAWN AEROBIC AND ANAEROBIC Blood Culture adequate volume   Culture NO GROWTH 1 DAY  Final   Report Status PENDING  Incomplete  Culture, blood (Routine X 2) w Reflex to ID Panel     Status: None (Preliminary result)   Collection Time: 04/06/17  6:38 PM  Result Value Ref Range Status   Specimen Description BLOOD RIGHT HAND  Final   Special Requests   Final    BOTTLES DRAWN AEROBIC ONLY Blood Culture adequate volume   Culture NO GROWTH < 24 HOURS  Final   Report Status PENDING  Incomplete  MRSA PCR Screening     Status: None   Collection Time: 04/06/17 10:37 PM  Result Value Ref Range Status   MRSA by PCR NEGATIVE NEGATIVE Final    Comment:        The GeneXpert MRSA Assay (FDA approved for NASAL specimens only), is one component of a comprehensive MRSA colonization surveillance program. It is not intended to diagnose MRSA infection nor to guide or monitor treatment for MRSA infections.     Coagulation Studies:  Recent Labs  04/06/17 1213  LABPROT 14.6  INR 1.13    Imaging: Ct Head Wo Contrast  Result Date: 04/07/2017 CLINICAL DATA:  Subdural hematoma follow-up EXAM: CT HEAD WITHOUT CONTRAST TECHNIQUE: Contiguous axial images were obtained from the base of the skull through the vertex without intravenous contrast. COMPARISON:  Head CT 04/06/2017 FINDINGS: Brain: Intraparenchymal hematoma in the left temporal lobe measures 4.3 x 2.2 x 4.4 cm (volume = 22 cm^3), previously 4.1 x 2.0 x 4.4 cm (volume = 19 cm^3). Left convexity subdural hematoma is unchanged in thickness measuring 5-6 mm. Hematoma extends along the left leaflet of the cerebellar tentorium. There is mass effect on the lateral ventricles, but no hydrocephalus or ventricular trapping. There is a rightward midline shift measuring 7 mm, previously 6 mm. No new area of hemorrhage. Basal cisterns remain patent. No  cerebellar tonsillar herniation. Vascular: No hyperdense vessel or unexpected  calcification. Skull: Normal visualized skull base, calvarium and extracranial soft tissues. Sinuses/Orbits: No sinus fluid levels or advanced mucosal thickening. No mastoid effusion. Normal orbits. IMPRESSION: 1. Slightly increased size of left temporal lobe intraparenchymal hematoma with very slight increase in rightward midline shift common of 7 mm. No new area of hemorrhage. 2. Unchanged size of left convexity subdural hematoma. 3. No hydrocephalus. Electronically Signed   By: Deatra Robinson M.D.   On: 04/07/2017 05:52   Ct Head Wo Contrast  Result Date: 04/06/2017 CLINICAL DATA:  Found covered and vomit at care facility, last seen normal at 1700 hours. RIGHT occipital hematoma. Recently admitted for sepsis. EXAM: CT HEAD WITHOUT CONTRAST TECHNIQUE: Contiguous axial images were obtained from the base of the skull through the vertex without intravenous contrast. COMPARISON:  CT HEAD November 12, 2008 and MRI of the head September 14, 2011 FINDINGS: BRAIN: 2 x 4.1 cm LEFT temporal lobe intraparenchymal hematoma with subcentimeter satellite hematomas. Low-density surrounding vasogenic edema. In addition, 7 mm dense LEFT holo hemispheric subdural hematoma. Acute bilateral cerebellar tentorial subdural hematomas measuring to 3 mm extending to the RIGHT interhemispheric fissure. 7 mm LEFT-to-RIGHT midline shift. Mild mass effect LEFT lateral ventricle without entrapment or hydrocephalus. Basal cisterns are patent. VASCULAR: Mild calcific atherosclerosis of the carotid siphons. SKULL: No skull fracture. Small RIGHT parietal scalp hematoma without subcutaneous gas or radiopaque foreign bodies. SINUSES/ORBITS: Status post RIGHT status with RIGHT sphenoid sinus air-fluid level. Mild paranasal sinus mucosal thickening. Under pneumatized, well-aerated mastoid aircells. The included ocular globes and orbital contents are non-suspicious. Status post bilateral ocular lens implants. OTHER: None. IMPRESSION: 1. Acute 2 x 4.1 cm  LEFT temporal lobe hematoma with surrounding subcentimeter intraparenchymal hemorrhages. 2. Acute LEFT holo hemispheric subdural hematoma with acute falcotentorial subdural hematoma measuring to 3 mm. 3. 7 mm LEFT-to-RIGHT midline shift without ventricle entrapment. 4. Small RIGHT parietal scalp hematoma, no skull fracture. Critical Value/emergent results were called by telephone at the time of interpretation on 04/06/2017 at 1:55 pm to Dr. Loren Racer , who verbally acknowledged these results. Electronically Signed   By: Awilda Metro M.D.   On: 04/06/2017 14:06   Ct Cervical Spine Wo Contrast  Result Date: 04/06/2017 CLINICAL DATA:  Fall EXAM: CT CERVICAL SPINE WITHOUT CONTRAST TECHNIQUE: Multidetector CT imaging of the cervical spine was performed without intravenous contrast. Multiplanar CT image reconstructions were also generated. COMPARISON:  04/06/2017 FINDINGS: Alignment: Trace retrolisthesis of C4 on C5. Straightening of the cervical spine. Skull base and vertebrae: Craniovertebral junction is intact. There is no fracture identified. Mildly hypoplastic appearing C5 and C6 vertebral bodies with fusion. Soft tissues and spinal canal: No prevertebral fluid or swelling. No visible canal hematoma. Disc levels: Marked disc space narrowing, endplate change and osteophyte at C4-C5 with posterior disc osteophyte complex and mild canal stenosis. Moderate to marked bilateral foraminal narrowing at this level. Moderate narrowing at C6-C7 with moderate foraminal narrowing. Upper chest: Negative. Other: None IMPRESSION: 1. No definite acute osseous abnormality 2. 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 Moderate severe degenerative disc changes at C4-C5 and C6-C7. Electronically Signed   By: Jasmine Pang M.D.   On: 04/06/2017 17:43   Dg Chest Port 1 View  Result Date: 04/07/2017 CLINICAL DATA:  Followup respiratory failure. EXAM: PORTABLE CHEST 1 VIEW COMPARISON:   04/06/2017 FINDINGS: Chronic cardiomegaly with left ventricular prominence. Chronic aortic atherosclerosis. Patchy density in both lower lobes consistent with  mild atelectasis/ pneumonia. Upper lungs are clear. No evidence heart failure or effusion. No acute bone finding. IMPRESSION: Cardiomegaly. Persistent patchy lower lobe density consistent with atelectasis or mild pneumonia. Electronically Signed   By: Paulina Fusi M.D.   On: 04/07/2017 07:25   Dg Chest Port 1 View  Result Date: 04/06/2017 CLINICAL DATA:  Sepsis, emesis and unresponsive. EXAM: PORTABLE CHEST 1 VIEW COMPARISON:  02/05/2014 CXR FINDINGS: Cardiomegaly with aortic atherosclerosis. Right basilar atelectasis and/or scarring. No pneumonic consolidation, CHF nor effusion. No pneumothorax. No acute nor suspicious osseous abnormality. IMPRESSION: Cardiomegaly with aortic atherosclerosis. Right basilar atelectasis and/or scarring. Otherwise negative exam. Electronically Signed   By: Tollie Eth M.D.   On: 04/06/2017 22:17    Medications:  Scheduled: . levothyroxine  25 mcg Intravenous Daily  . mouth rinse  15 mL Mouth Rinse BID  . metoprolol tartrate  2.5 mg Intravenous Q6H    Assessment/Plan:  Assessment and Plan:  1. Acute ICH: This is most likely traumatic in etiology given history of frequent falls with evidence of abrasion to her forehead and scalp hematoma in the occiput. I cannot completely exclude the possibility that this represents a primary hypertensive hemorrhage, however. Other less likely considerations include AVM, aneurysm, sympathomimetic abuse, and complication from anticoagulation or antiplatelet therapy. This has remained stable. PT/INR were normal on presentation. Urine drug screen was not sent. Monitor blood pressure, goal SBP 140-160 mmHg. Avoid antiplatelet agents and NSAIDs for1-2 weeks. Avoid therapeutic systemic anticoagulation for the next 3-4 weeks. Avoid fever and hyperglycemia as these are associated with  worse neurologic prognosis. Avoid hypotonic IVF to minimize the risk of exacerbating cerebral edema. Frequent neuro checks.   ICH score: 1  2. Subdural hematoma: She has a small left-sided subdural hematoma. This is likely traumatic in nature as above. However, I cannot exclude the possibility of a primary hypertensive hemorrhage with extension into the subdural space. For now, this is not an operable lesion and would continue with supportive care. Avoid nonsteroidal anti-inflammatories, antiplatelet agents, and anticoagulation.  3. Acute encephalopathy: This is significant. She does have some purposeful withdrawal and spontaneous movements of the extremities but does not engage her participate with the examination. This is likely enlarged part the result of her intracerebral hemorrhage. If this is truly a traumatic injury, then there may be an element of postconcussive symptoms as well. 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 This would predispose her to encephalopathy and delirium from all causes. She is more likely to be significantly affected from insults that would be less impactful on a person with normal cerebral reserve, and it is also likely that she will take longer to recover from these insults as well. Unfortunately, it is probable that she will have a new neurologic baseline after this is all over with but this can only be determined over time. For now, treatment is supportive. Continue to optimize metabolic status as you are. Continue to treat any underlying infection as you are. Limit CNS active medications, particularly benzodiazepines, opiates, and anything with strong anticholinergic properties. There is no evidence of seizure activity on her exam and no seizures have been reported. There still remains to be in no evidence of seizure activity at this time. We'll hold off on EEG at this time.         Felicie Morn PA-C [facility] [phone]  04/08/2017, 10:25 AM

## 2017-04-08 NOTE — Progress Notes (Signed)
Per night RN, Staff unable to get pt to lie supine for CXR (pt rolls left and right) despite multiple attempts with staff and family assistance. Will notify MD on rounds.

## 2017-04-08 NOTE — Progress Notes (Signed)
Pt seen and examined. No issues overnight. Remains at baseline from yesterday  EXAM: Temp:  [97.6 F (36.4 C)-101.7 F (38.7 C)] 98.4 F (36.9 C) (05/17 1303) Pulse Rate:  [68-101] 91 (05/17 0805) Resp:  [16-34] 28 (05/17 0805) BP: (105-174)/(45-131) 158/57 (05/17 0805) SpO2:  [93 %-100 %] 98 % (05/17 0805) Weight:  [52.6 kg (115 lb 15.4 oz)] 52.6 kg (115 lb 15.4 oz) (05/17 0500) Intake/Output      05/16 0701 - 05/17 0700 05/17 0701 - 05/18 0700   I.V. (mL/kg) 1200 (22.8)    IV Piggyback 510    Total Intake(mL/kg) 1710 (32.5)    Urine (mL/kg/hr) 1020 (0.8)    Total Output 1020     Net +690           Somnolent, not arousable to voice Not Following commands Does move spontaneously all extremities, does not follow commands  Stable Continue current care

## 2017-04-08 NOTE — Progress Notes (Signed)
PULMONARY / CRITICAL CARE MEDICINE   Name: Laura [name] MRN: [id number] DOB: 03/17/1941    ADMISSION DATE:  04/06/2017 CONSULTATION DATE:  04/06/17  REFERRING MD:  Dr. Armond HangWoodrum / EDP   CHIEF COMPLAINT:  Altered mental status   HISTORY OF PRESENT ILLNESS:   76 y/o F, SNF resident, with PMH of deafness, who [facility] with altered mental status.  She lives at [facility] and was found around 1100 am in her chair covered in emesis.  She was last known normal at 1700 on 5/14.  CT of the head was evaluated which demonstrated left temporal lobe hematoma with surrounding intraparenchymal hemorrhages, acute left holo hemispheric subdural hematoma with acute falcotentorial subdural hematoma with L to R midline shift without ventricle entrapement, small right parietal scalp hematoma, no skull fracture.  She was evaluated by neurosurgery in the ER.  Initial labs- Na 131, K 3.4, glucose 146, BUN 16 / Sr Cr 0.50, lactic acid 2.02, WBC 13.5, hgb 12.5 and platelets 235.  CXR pending.   PCCM called for admission.   SUBJECTIVE:  No events overnight, moving more but remains unresponsive  VITAL SIGNS: BP (!) 158/57   Pulse 91   Temp 98 F (36.7 C) (Oral)   Resp (!) 28   Ht 5\' 2"  (1.575 m)   Wt 52.6 kg (115 lb 15.4 oz)   SpO2 98%   BMI 21.21 kg/m   HEMODYNAMICS:    VENTILATOR SETTINGS:    INTAKE / OUTPUT: I/O last 3 completed shifts: In: 2714.2 [I.V.:1794.2; IV Piggyback:920] Out: 2620 [Urine:2620]  PHYSICAL EXAMINATION: General: Chronically ill appearing female, NAD HEENT: MM pink/moist Neuro: Opens eyes but otherwise unresponsive CV: RRR, Nl S1/S2, -M/R/G. PULM: CTA bilaterally YQ:MVHQGI:soft, non-tender, bsx4 active  Extremities: warm/dry, no edema  Skin: multiple skin lesions/scabs of varying stages from baseline picking related to anxiety   LABS:  BMET  Recent Labs Lab 04/06/17 1213 04/06/17 1228 04/07/17 0316 04/08/17 0222  NA 132* 137 133* 134*  K 3.8  3.4* 4.0 3.9  CL 98* 101 102 103  CO2 25  --  25 23  BUN 16 16 10 14   CREATININE 0.75 0.50 0.61 0.61  GLUCOSE 167* 146* 135* 167*   Electrolytes  Recent Labs Lab 04/06/17 1213 04/06/17 1819 04/07/17 0316 04/08/17 0222  CALCIUM 8.2*  --  7.5* 7.8*  MG  --  1.7 1.7 1.8  PHOS  --  2.7  --   --     CBC  Recent Labs Lab 04/06/17 1213 04/06/17 1228 04/07/17 0316 04/08/17 0222  WBC 13.5*  --  13.3* 14.7*  HGB 12.5 12.6 11.7* 11.2*  HCT 38.4 37.0 36.5 35.2*  PLT 235  --  197 205    Coag's  Recent Labs Lab 04/06/17 1213  INR 1.13    Sepsis Markers  Recent Labs Lab 04/06/17 1228 04/06/17 1819  LATICACIDVEN 2.02* 2.7*    ABG No results for input(s): PHART, PCO2ART, PO2ART in the last 168 hours.  Liver Enzymes  Recent Labs Lab 04/06/17 1213  AST 30  ALT 19  ALKPHOS 69  BILITOT 0.7  ALBUMIN 3.8    Cardiac Enzymes No results for input(s): TROPONINI, PROBNP in the last 168 hours.  Glucose No results for input(s): GLUCAP in the last 168 hours.  Imaging No results found.   STUDIES:  CT Head 5/15 >> left temporal lobe hematoma with surrounding intraparenchymal hemorrhages, acute left holo hemispheric subdural hematoma with acute falcotentorial subdural  hematoma with L to R midline shift without ventricle entrapement, small right parietal scalp hematoma, no skull fracture CT Neck 5/15 >> no acute osseous abnormality CT Head 5/16 >> slightly increased size of left temporal lobe intraparenchymal hematoma with very slight increase in rightward midline shift shift of 7 mm, no new area of hemorrhage, unchanged size of left convexity subdural hematoma, no hydrocephalus   CULTURES: BCx2 5/15 >>   ANTIBIOTICS: Vanco & Zosyn x1 in ER  Unasyn 5/15 >>   SIGNIFICANT EVENTS: 5/15  Admit with AMS, CT head positive for L CVA   LINES/TUBES:  I reviewed CXR myself, no acute disease noted.  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 Traumatic injury.  PCCM called for ICU admit.  Discussed with PCCM-NP and [facility]-MD.  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 fall Hx Deafness, Mild Mental Deficit / Asperger's - pt at baseline lives in an ALF, able to go with the group to the grocery store / buy groceries, does not recognize social que's, functions at an the level of ~ 76 year old  P:   RASS goal: n/a. D/C all sedating medications. SBP Goal <160 per NSGY. Continue C-collar until soft tissue injury can be ruled out when patient is more responsive NSGY following, appreciate input Hold home klonopin > was PRN for tremor  Neurology following, appreciate input  PULMONARY A: Rule Out Aspiration - bilateral lower lobe atelectasis  P:   Change CXR to intermittently See ID section O2 as needed to support sats >92% Pulmonary hygiene - mobilize, upright positioning    CARDIOVASCULAR A:  Hx HTN, HLD P:  Monitor hemodynamics in ICU PRN lopressor 2.5 mg IV Q6 for SBP >160  RENAL A:   Hypokalemia - mild Elevated Lactic Acid Rhabdomyolysis ruled out P:   Trend BMP / urinary output Replace electrolytes as indicated. Avoid nephrotoxic agents, ensure adequate renal perfusion NS with 40 meq KCl/liter at 50 ml/hr.  GASTROINTESTINAL A:   Vomiting - suspect in setting of L CVA/hemorrhage  P:   PRN zofran  Aspiration precautions NPO, if remains unresponsive by AM then will need NGT and TF  HEMATOLOGIC A:   Mild Leukocytosis  P:  CBC in AM Transfuse per ICU protocol  INFECTIOUS A:   Fever - anticipate related to L CVA/hemorrhage, rule out infectious etiology P:   Empiric Unasyn as above, D3/x  Follow cultures, PCT to narrow abx  ENDOCRINE A:   Hypothyroidism - TSH wnl on admit Hyperglycemia  P:   Resume synthroid IV for now  Monitor glucose on BMP   FAMILY  - Updates:  Family updated bedside.  - Inter-disciplinary  family meet or Palliative Care meeting due by: 5/22  Transfer to SDU and to [facility] service with PCCM off 5/18.  Alyson Reedy, M.D. [facility] Pulmonary/Critical Care Medicine. Pager: [phone]. After hours pager: [phone].

## 2017-04-09 ENCOUNTER — Encounter (HOSPITAL_COMMUNITY): Payer: Self-pay

## 2017-04-09 DIAGNOSIS — E039 Hypothyroidism, unspecified: Secondary | ICD-10-CM

## 2017-04-09 DIAGNOSIS — J690 Pneumonitis due to inhalation of food and vomit: Secondary | ICD-10-CM

## 2017-04-09 LAB — CBC
HCT: 35.4 % — ABNORMAL LOW (ref 36.0–46.0)
Hemoglobin: 11.1 g/dL — ABNORMAL LOW (ref 12.0–15.0)
MCH: 30.5 pg (ref 26.0–34.0)
MCHC: 31.4 g/dL (ref 30.0–36.0)
MCV: 97.3 fL (ref 78.0–100.0)
Platelets: 232 10*3/uL (ref 150–400)
RBC: 3.64 MIL/uL — ABNORMAL LOW (ref 3.87–5.11)
RDW: 13.1 % (ref 11.5–15.5)
WBC: 14.3 10*3/uL — ABNORMAL HIGH (ref 4.0–10.5)

## 2017-04-09 LAB — BASIC METABOLIC PANEL
Anion gap: 10 (ref 5–15)
BUN: 22 mg/dL — AB (ref 6–20)
CALCIUM: 7.8 mg/dL — AB (ref 8.9–10.3)
CO2: 21 mmol/L — ABNORMAL LOW (ref 22–32)
Chloride: 109 mmol/L (ref 101–111)
Creatinine, Ser: 0.6 mg/dL (ref 0.44–1.00)
GFR calc non Af Amer: >60 mL/min (ref >60)
Glucose, Bld: 128 mg/dL — ABNORMAL HIGH (ref 65–99)
Potassium: 4 mmol/L (ref 3.5–5.1)
SODIUM: 140 mmol/L (ref 135–145)

## 2017-04-09 LAB — PHOSPHORUS: PHOSPHORUS: 1.7 mg/dL — AB (ref 2.5–4.6)

## 2017-04-09 LAB — MAGNESIUM: MAGNESIUM: 2.2 mg/dL (ref 1.7–2.4)

## 2017-04-09 MED ORDER — SODIUM PHOSPHATES 45 MMOLE/15ML IV SOLN
30.0000 mmol | Freq: Once | INTRAVENOUS | Status: AC
  Administered 2017-04-09: 30 mmol via INTRAVENOUS
  Filled 2017-04-09: qty 10

## 2017-04-09 NOTE — Progress Notes (Signed)
eLink Physician-Brief Progress Note Patient Name: Laura [name] DOB: 01-Sep-1941 MRN: [id number]   Date of Service  04/09/2017  HPI/Events of Note  hypophos  eICU Interventions  Phos replaced     Intervention Category Intermediate Interventions: Electrolyte abnormality - evaluation and management  Mouhamed Glassco 04/09/2017, 5:20 AM

## 2017-04-09 NOTE — Progress Notes (Signed)
Pt seen and examined.  No issues overnight. Much more awake today She denies any headache but does not answer about other pain Does sign about wanting a cup  EXAM: Temp:  [96.9 F (36.1 C)-99.3 F (37.4 C)] 98.8 F (37.1 C) (05/18 1200) Pulse Rate:  [38-98] 81 (05/18 1300) Resp:  [14-30] 23 (05/18 1300) BP: (122-177)/(59-107) 147/98 (05/18 1300) SpO2:  [96 %-98 %] 98 % (05/18 1300) Weight:  [54.5 kg (120 lb 2.4 oz)] 54.5 kg (120 lb 2.4 oz) (05/18 0500) Intake/Output      05/17 0701 - 05/18 0700 05/18 0701 - 05/19 0700   I.V. (mL/kg) 1200 (22) 100 (1.8)   IV Piggyback 560    Total Intake(mL/kg) 1760 (32.3) 100 (1.8)   Urine (mL/kg/hr) 635 (0.5) 150 (0.4)   Total Output 635 150   Net +1125 -50         Awake and alert Still in C collar Intermittently follows commands - friends state this is baseline CN grossly intact MAEW  Plan Much more awake and alert compared to yesterday. Stable. Continue current care

## 2017-04-09 NOTE — Progress Notes (Signed)
[facility] PROGRESS NOTE  Laura [name] ZOX:[id number] DOB: 07/09/1941 DOA: 04/06/2017  PCP: Romero Belling, MD  Brief History/Interval Summary: 76 year old female who apparently lives at a assisted living facility who has a past medical history of deafness, autism, was brought in from her facility for unresponsiveness. Apparently she has been having nausea and vomiting. She was found to have a hematoma over her right occipital scalp area. Evaluation revealed a left temporal lobe intracranial hematoma along with a left-sided subdural hematoma. She had a fever when she was initially evaluated. She was placed on empiric antibiotics. Neurosurgery and neurology was consulted. She was admitted to the intensive care unit.  Reason for Visit: Intracranial hemorrhage  Consultants: Neurosurgery. Neurology  Procedures: None so far  Antibiotics: Unasyn  Subjective/Interval History: Patient awake, alert. Unable to communicate due to her hearing impairment. Her friend is at the bedside. States that she appears to be more responsive today than before.  ROS: Unable to do  Objective:  Vital Signs  Vitals:   04/09/17 0600 04/09/17 0700 04/09/17 0800 04/09/17 0900  BP: (!) 154/72 (!) 147/92 (!) 170/80 140/83  Pulse: 77 91 81 96  Resp: (!) 26 20 19  (!) 30  Temp:   (!) 96.9 F (36.1 C)   TempSrc:   Axillary   SpO2: 96% 98% 97% 96%  Weight:      Height:        Intake/Output Summary (Last 24 hours) at 04/09/17 1118 Last data filed at 04/09/17 0900  Gross per 24 hour  Intake             1860 ml  Output              635 ml  Net             1225 ml   Filed Weights   04/07/17 0413 04/08/17 0500 04/09/17 0500  Weight: 53 kg (116 lb 13.5 oz) 52.6 kg (115 lb 15.4 oz) 54.5 kg (120 lb 2.4 oz)    General appearance: alert, appears stated age and no distress Resp: clear to auscultation bilaterally Cardio: regular rate and rhythm, S1, S2 normal, no murmur, click, rub or gallop GI: soft,  non-tender; bowel sounds normal; no masses,  no organomegaly Extremities: extremities normal, atraumatic, no cyanosis or edema Neurologic: She is awake, alert. Moving all her extremities.  Lab Results:  Data Reviewed: I have personally reviewed following labs and imaging studies  CBC:  Recent Labs Lab 04/06/17 1213 04/06/17 1228 04/07/17 0316 04/08/17 0222 04/09/17 0340  WBC 13.5*  --  13.3* 14.7* 14.3*  NEUTROABS 11.5*  --   --   --   --   HGB 12.5 12.6 11.7* 11.2* 11.1*  HCT 38.4 37.0 36.5 35.2* 35.4*  MCV 94.3  --  96.3 97.0 97.3  PLT 235  --  197 205 232    Basic Metabolic Panel:  Recent Labs Lab 04/06/17 1213 04/06/17 1228 04/06/17 1819 04/07/17 0316 04/08/17 0222 04/09/17 0340  NA 132* 137  --  133* 134* 140  K 3.8 3.4*  --  4.0 3.9 4.0  CL 98* 101  --  102 103 109  CO2 25  --   --  25 23 21*  GLUCOSE 167* 146*  --  135* 167* 128*  BUN 16 16  --  10 14 22*  CREATININE 0.75 0.50  --  0.61 0.61 0.60  CALCIUM 8.2*  --   --  7.5* 7.8* 7.8*  MG  --   --  1.7 1.7 1.8 2.2  PHOS  --   --  2.7  --   --  1.7*    GFR: Estimated Creatinine Clearance: 48.8 mL/min (by C-G formula based on SCr of 0.6 mg/dL).  Liver Function Tests:  Recent Labs Lab 04/06/17 1213  AST 30  ALT 19  ALKPHOS 69  BILITOT 0.7  PROT 7.1  ALBUMIN 3.8    Coagulation Profile:  Recent Labs Lab 04/06/17 1213  INR 1.13    Cardiac Enzymes:  Recent Labs Lab 04/06/17 1819  CKTOTAL 85     Recent Results (from the past 240 hour(s))  Culture, blood (Routine X 2) w Reflex to ID Panel     Status: None (Preliminary result)   Collection Time: 04/06/17  2:15 PM  Result Value Ref Range Status   Specimen Description BLOOD LEFT ANTECUBITAL  Final   Special Requests   Final    BOTTLES DRAWN AEROBIC AND ANAEROBIC Blood Culture adequate volume   Culture NO GROWTH 2 DAYS  Final   Report Status PENDING  Incomplete  Culture, blood (Routine X 2) w Reflex to ID Panel     Status: None  (Preliminary result)   Collection Time: 04/06/17  6:38 PM  Result Value Ref Range Status   Specimen Description BLOOD RIGHT HAND  Final   Special Requests   Final    BOTTLES DRAWN AEROBIC ONLY Blood Culture adequate volume   Culture NO GROWTH 2 DAYS  Final   Report Status PENDING  Incomplete  MRSA PCR Screening     Status: None   Collection Time: 04/06/17 10:37 PM  Result Value Ref Range Status   MRSA by PCR NEGATIVE NEGATIVE Final    Comment:        The GeneXpert MRSA Assay (FDA approved for NASAL specimens only), is one component of a comprehensive MRSA colonization surveillance program. It is not intended to diagnose MRSA infection nor to guide or monitor treatment for MRSA infections.       Radiology Studies: No results found.   Medications:  Scheduled: . levothyroxine  25 mcg Intravenous Daily  . mouth rinse  15 mL Mouth Rinse BID  . metoprolol tartrate  2.5 mg Intravenous Q6H   Continuous: . sodium chloride    . 0.9 % NaCl with KCl 40 mEq / L 50 mL/hr at 04/09/17 0900  . ampicillin-sulbactam (UNASYN) IV Stopped (04/09/17 0603)  . levETIRAcetam Stopped (04/09/17 0247)  . sodium phosphate  Dextrose 5% IVPB 30 mmol (04/09/17 0643)   ZOX:WRUEAV chloride, acetaminophen, hydrALAZINE, metoprolol tartrate, ondansetron (ZOFRAN) IV  Assessment/Plan:  Active Problems:   CVA (cerebral vascular accident) (HCC)    Intracranial hemorrhage/subdural hematoma/acute encephalopathy Etiology is unclear. Could be related to trauma workup be due to hypertension. Neurology and neurosurgery following. Patient's mental status appears to have improved in the last 24 hours. She is on antiepileptic treatment with Keppra. At baseline patient lives in an assisted living facility. She has autism. Her intellectual level is that of her 76 year old.  Cervical neck collar This was placed at the time of admission. CT of the cervical spine did not show any acute findings. For some reason the  cervical collar was continued. May need flexion extension films. We'll obtain this in the next day or so.  Aspiration pneumonia Continue Unasyn. Appears to be stable from a respiratory standpoint. Blood cultures are negative so far.  Essential hypertension Continue to monitor blood pressures closely. She is on metoprolol.  History of hypothyroidism. Continue with  levothyroxine. Transition to oral when she is able to take by mouth.  Hypokalemia/hypophosphatemia Potassium level is normal. Phosphate was repleted.  Vomiting This occurred at the assisted living facility. None noted here so far. Episode of vomiting could've been due to her intracranial hemorrhage.  History of autism and deafness. Stable  DVT Prophylaxis: SCDs    Code Status: DO NOT RESUSCITATE  Family Communication: No family at bedside  Disposition Plan: Management as outlined above.    LOS: 3 days   Tmc HealthcareKRISHNAN,Rakeisha Nyce  [facility] Pager [phone] 04/09/2017, 11:18 AM  If 7PM-7AM, please contact night-coverage at [email or web address], password [email or web address]

## 2017-04-09 NOTE — Progress Notes (Signed)
  Speech Language Pathology Treatment: Dysphagia  Patient Details Name: Laura [name] MRN: [id number] DOB: [birth year] Today's Date: 04/09/2017 Time: [id number] SLP Time Calculation (min) (ACUTE ONLY): 24 min  Assessment / Plan / Recommendation Clinical Impression  Pt is much more alert today with eyes open. She does not initiate much communication with interpreter and caregiver present to sign with her, but she does intermittently nod her head yes. She consumed thin liquids and solids without overt s/s of aspiration, although vocal quality could not be assessed. She has prolonged mastication with soft solids, but liquid washes were provided to assist with clearance of oral residuals. Eructation was also noted throughout intake. Recommend initiation of Dys 2 diet and thin liquids with full supervision. Will f/u for tolerance.   HPI HPI: Pt is a 76 y/o F admitted from ALF with AMS, found to have L temporal lobe hematoma. PMH includes deafness (uses ASL), asperger's, cataracts, arthritis, HTN, HLD, anxiety      SLP Plan  Goals updated       Recommendations  Diet recommendations: Dysphagia 2 (fine chop);Thin liquid Liquids provided via: Cup;Straw Medication Administration: Whole meds with puree Supervision: Staff to assist with self feeding;Full supervision/cueing for compensatory strategies Compensations: Slow rate;Small sips/bites;Follow solids with liquid Postural Changes and/or Swallow Maneuvers: Seated upright 90 degrees;Upright 30-60 min after meal                Oral Care Recommendations: Oral care BID Follow up Recommendations:  (tba) SLP Visit Diagnosis: Dysphagia, unspecified (R13.10) Plan: Goals updated       GO                Laura [name], Laura [name] 04/09/2017, 1:29 PM  Laura [name], M.A. CCC-SLP [phone]

## 2017-04-09 NOTE — Care Management Note (Signed)
Case Management Note  Patient Details  Name: Laura [name] MRN: [id number] Date of Birth: [birth year]  Subjective/Objective:    Pt admitted on 04/06/17 with ICH/SDH.  PTA, [facility] resided at [facility].                  Action/Plan: PT/OT consults pending.  CSW consulted to facilitate return to facility upon medical stability.    Expected Discharge Date:                  Expected Discharge Plan:  Assisted Living / Rest Home  In-House Referral:  Clinical Social Work  Discharge planning Services  CM Consult  Post Acute Care Choice:    Choice offered to:     DME Arranged:    DME Agency:     HH Arranged:    HH Agency:     Status of Service:  In process, will continue to follow  If discussed at Long Length of Stay Meetings, dates discussed:    Additional Comments:  Quintella BatonJulie W. Lanett Lasorsa, RN, BSN  Trauma/Neuro ICU Case Manager [phone]

## 2017-04-09 NOTE — Progress Notes (Addendum)
Neurology Progress Note  Subjective: Dramatic improvement in mental status today. She is now alert and per friends at the bedside is communicating well via sign language. She does not indicate any headache or pain. She follows some commands for me, appears mildly perseverative.   Medications reviewed and reconciled.   Pertinent meds: Keppra 500 mg q12h  Current Meds:   Current Facility-Administered Medications:  .  0.9 %  sodium chloride infusion, 250 mL, Intravenous, PRN, Ollis, Brandi L, NP .  0.9 % NaCl with KCl 40 mEq / L  infusion, , Intravenous, Continuous, Merwyn Katos, MD, Last Rate: 50 mL/hr at 04/09/17 1257, 50 mL/hr at 04/09/17 1257 .  acetaminophen (TYLENOL) suppository 650 mg, 650 mg, Rectal, Q6H PRN, Ollis, Brandi L, NP, 650 mg at 04/08/17 1249 .  Ampicillin-Sulbactam (UNASYN) 3 g in sodium chloride 0.9 % 100 mL IVPB, 3 g, Intravenous, Q8H, Nelda Bucks, MD, Stopped at 04/09/17 [phone] .  hydrALAZINE (APRESOLINE) injection 10-40 mg, 10-40 mg, Intravenous, Q4H PRN, Ollis, Brandi L, NP, 20 mg at 04/09/17 0808 .  levETIRAcetam (KEPPRA) 500 mg in sodium chloride 0.9 % 100 mL IVPB, 500 mg, Intravenous, Q12H, Costella, Darci Current, PA-C, Stopped at 04/09/17 0247 .  levothyroxine (SYNTHROID, LEVOTHROID) injection 25 mcg, 25 mcg, Intravenous, Daily, Ollis, Brandi L, NP, 25 mcg at 04/09/17 0911 .  MEDLINE mouth rinse, 15 mL, Mouth Rinse, BID, Nelda Bucks, MD, 15 mL at 04/09/17 0907 .  metoprolol tartrate (LOPRESSOR) injection 2.5 mg, 2.5 mg, Intravenous, Q6H PRN, Ollis, Brandi L, NP, 2.5 mg at 04/08/17 1043 .  metoprolol tartrate (LOPRESSOR) injection 2.5 mg, 2.5 mg, Intravenous, Q6H, Ollis, Brandi L, NP, 2.5 mg at 04/09/17 1259 .  ondansetron (ZOFRAN) injection 4 mg, 4 mg, Intravenous, Q8H PRN, Ollis, Brandi L, NP  Objective:  Temp:  [96.9 F (36.1 C)-99.3 F (37.4 C)] 98.8 F (37.1 C) (05/18 1200) Pulse Rate:  [38-98] 81 (05/18 1300) Resp:  [14-30] 23 (05/18  1300) BP: (122-177)/(59-107) 147/98 (05/18 1300) SpO2:  [96 %-98 %] 98 % (05/18 1300) Weight:  [54.5 kg (120 lb 2.4 oz)] 54.5 kg (120 lb 2.4 oz) (05/18 0500)  General: WDWN Caucasian woman lying in bed in NAD. She is alert. She is able to sign with her friends but this is limited.  HEENT: Neck is supple without lymphadenopathy. Mucous membranes are moist and the oropharynx is clear. Sclerae are anicteric. There is no conjunctival injection.  CV: Regular, no murmur. Carotid pulses are 2+ and symmetric with no bruits. Distal pulses 2+ and symmetric.  Lungs: CTAB  Extremities: No C/C/E. Neuro: MS: As noted above.  CN: Pupils are equal and reactive from 3-->2 mm bilaterally. EOMI, no nystagmus. She blinks to visual threat. Corneals appear intact and symmetric. Facial sensation is intact to light touch. Her face appears grossly symmetric. She is deaf. The remainder of her cranial nerves cannot be accurately assessed as she does not participate.  Motor: Normal bulk, tone. She moves all four extremities well. She has some trouble following commands for confrontational strength testing but is at least 4/5 throughout. No pronator drift. No tremor or other abnormal movements are observed.  Sensation: She withdraws from minimal noxious stimulation x4.   DTRs: 3+, brisker on the R. Toes are upgoing bilaterally.  Coordination/gait: She does not follow commands for testing. She has no overt dysmetria with observed movements of the arms.   Labs: Lab Results  Component Value Date   WBC 14.3 (H) 04/09/2017  HGB 11.1 (L) 04/09/2017   HCT 35.4 (L) 04/09/2017   PLT 232 04/09/2017   GLUCOSE 128 (H) 04/09/2017   CHOL 202 (H) 09/08/2016   TRIG (H) 09/08/2016    414.0 Triglyceride is over 400; calculations on Lipids are invalid.   HDL 54.40 09/08/2016   LDLDIRECT 95.0 09/08/2016   ALT 19 04/06/2017   AST 30 04/06/2017   NA 140 04/09/2017   K 4.0 04/09/2017   CL 109 04/09/2017   CREATININE 0.60  04/09/2017   BUN 22 (H) 04/09/2017   CO2 21 (L) 04/09/2017   TSH 2.29 03/23/2017   INR 1.13 04/06/2017   HGBA1C 6.6 (H) 03/23/2017   CBC Latest Ref Rng & Units 04/09/2017 04/08/2017 04/07/2017  WBC 4.0 - 10.5 K/uL 14.3(H) 14.7(H) 13.3(H)  Hemoglobin 12.0 - 15.0 g/dL 11.1(L) 11.2(L) 11.7(L)  Hematocrit 36.0 - 46.0 % 35.4(L) 35.2(L) 36.5  Platelets 150 - 400 K/uL 232 205 197    Lab Results  Component Value Date   HGBA1C 6.6 (H) 03/23/2017   Lab Results  Component Value Date   ALT 19 04/06/2017   AST 30 04/06/2017   ALKPHOS 69 04/06/2017   BILITOT 0.7 04/06/2017    Radiology:  There is no new neuroimaging for review.   A/P:   1. ICH: This is most likely traumatic in etiology. She has a large L temporal hematoma with adjacent subdural blood. This remained stable on repeat CTH. She is much more alert this morning. Continue to monitor blood pressure, avoiding SBP>160 mmHg. Avoid antiplatelet agents and NSAIDs for 1-2 weeks, carefully evaluating risk vs benefit before resuming. Avoid therapeutic systemic anticoagulation for 3-4 weeks. Avoid fever and hyperglycemia as these are associated with worse neurologic outcomes. Avoid hypotonic IVF.   2. Subdural hematoma: This is acute, traumatic in etiology. Treatment is supportive. Monitor BP. Avoid antiplatelet agents, NSAIDs, and anticoagulants as above.   3. Acute encephalopathy: This is much improved today. She is now awake, communicative, and following commands. Continue supportive care. Optimize metabolic status as you are. Continue to minimize CNS active meds, particularly benzos, opiates, and anything with strong anticholinergic properties. She has a static encephalopathy and is deaf at baseline. These increase her risk of encephalopathy from all causes.   This was discussed with the patient's friends. Education was provided on the diagnosis and expected evaluation and treatment. They are in agreement with the plan as noted. They were  given the opportunity to ask any questions and these were addressed to their satisfaction.   No additional recs at this time, will sign off. Call if any new issues arise.   Rhona Leavensimothy Oster, MD [facility]

## 2017-04-09 NOTE — Progress Notes (Signed)
Pharmacy Antibiotic Note  Laura [name] is a 76 y.o. female admitted on 04/06/2017 with sepsis and aspiration pneumonia.  Pharmacy has been consulted for unasyn dosing. Pt is afebrile but WBC remains elevated at 14.3. SCr is stable and WNL. Dose remains appropriate.   Plan: Continue unasyn 3gm IV Q8H F/u renal fxn, C&S, clinical status and LOT  Height: 5\' 2"  (157.5 cm) Weight: 120 lb 2.4 oz (54.5 kg) IBW/kg (Calculated) : 50.1  Temp (24hrs), Avg:98.6 F (37 C), Min:98 F (36.7 C), Max:99.3 F (37.4 C)   Recent Labs Lab 04/06/17 1213 04/06/17 1228 04/06/17 1819 04/07/17 0316 04/08/17 0222 04/09/17 0340  WBC 13.5*  --   --  13.3* 14.7* 14.3*  CREATININE 0.75 0.50  --  0.61 0.61 0.60  LATICACIDVEN  --  2.02* 2.7*  --   --   --     Estimated Creatinine Clearance: 48.8 mL/min (by C-G formula based on SCr of 0.6 mg/dL).    No Known Allergies  Antimicrobials this admission: Zosyn x 1 5/15 Unasyn 5/15>>  Dose adjustments this admission: N/A  Microbiology results: 5/15 MRSA - neg 5/15 Blood - NGTD  Thank you for allowing pharmacy to be a part of this patient's care.  Lysle Pearlachel Evett Kassa, PharmD, BCPS 04/09/2017 7:40 AM

## 2017-04-10 DIAGNOSIS — I10 Essential (primary) hypertension: Secondary | ICD-10-CM

## 2017-04-10 LAB — CBC
HCT: 31.7 % — ABNORMAL LOW (ref 36.0–46.0)
Hemoglobin: 10.1 g/dL — ABNORMAL LOW (ref 12.0–15.0)
MCH: 30.5 pg (ref 26.0–34.0)
MCHC: 31.9 g/dL (ref 30.0–36.0)
MCV: 95.8 fL (ref 78.0–100.0)
Platelets: 246 10*3/uL (ref 150–400)
RBC: 3.31 MIL/uL — ABNORMAL LOW (ref 3.87–5.11)
RDW: 12.9 % (ref 11.5–15.5)
WBC: 11.3 10*3/uL — AB (ref 4.0–10.5)

## 2017-04-10 LAB — BASIC METABOLIC PANEL
ANION GAP: 7 (ref 5–15)
BUN: 20 mg/dL (ref 6–20)
CHLORIDE: 107 mmol/L (ref 101–111)
CO2: 22 mmol/L (ref 22–32)
Calcium: 7.5 mg/dL — ABNORMAL LOW (ref 8.9–10.3)
Creatinine, Ser: 0.65 mg/dL (ref 0.44–1.00)
GFR calc Af Amer: >60 mL/min (ref >60)
GFR calc non Af Amer: >60 mL/min (ref >60)
Glucose, Bld: 110 mg/dL — ABNORMAL HIGH (ref 65–99)
POTASSIUM: 3.9 mmol/L (ref 3.5–5.1)
SODIUM: 136 mmol/L (ref 135–145)

## 2017-04-10 MED ORDER — METOPROLOL TARTRATE 25 MG PO TABS
25.0000 mg | ORAL_TABLET | Freq: Two times a day (BID) | ORAL | Status: DC
  Administered 2017-04-10 – 2017-04-13 (×6): 25 mg via ORAL
  Filled 2017-04-10 (×6): qty 1

## 2017-04-10 MED ORDER — LEVOTHYROXINE SODIUM 50 MCG PO TABS
50.0000 ug | ORAL_TABLET | Freq: Every day | ORAL | Status: DC
  Administered 2017-04-11 – 2017-04-13 (×3): 50 ug via ORAL
  Filled 2017-04-10 (×3): qty 1

## 2017-04-10 NOTE — Evaluation (Signed)
Physical Therapy Evaluation Patient Details Name: Laura [name] MRN: [id number] DOB: [birth year] Today's Date: 04/10/2017   History of Present Illness  Pt is a 76 yo female admitted through ED on 04/06/17 unresponsive. [facility] Pt was diagnosied with an ICH and subdural hematoma with acute encephalopathy possible due to a traumatic even. Pt has history of autism, anxiety, OA, HLD, hypothyroid. Pt is deaf.   Clinical Impression  Pt presents with the above diagnosis and below deficits for therapy evaluation. Prior to admission, pt was living independently in an ILF and able to do for herself including performing gait. Pt was able to mobilize without an AD. Pt does have autism and was able to follow one step commands previously, but was unable to process complicated or multi step commands. Pt is Deaf. Pt requires max tactile cues to initiate movement due to hearing loss. Pt requires max A for mobility but is able to sit EOB without support briefly before attempting to move herself back to bed. Due to previous history of independence, pt will benefit from CIR at discharge assuming she continues to make improvements with mobility and arousal. Pt continues to benefit from acute rehab services to improve function prior to DC.     Follow Up Recommendations CIR    Equipment Recommendations  None recommended by PT (defer to next venue)    Recommendations for Other Services Rehab consult     Precautions / Restrictions Precautions Precautions: Fall Restrictions Weight Bearing Restrictions: No      Mobility  Bed Mobility Overal bed mobility: Needs Assistance Bed Mobility: Supine to Sit;Sit to Supine     Supine to sit: Max assist;+2 for physical assistance Sit to supine: Max assist;+2 for physical assistance   General bed mobility comments: Pt is Max A x2 to get EOB and able to sit EOB for 5 minutes before she initiates laying back to bed. Pt is able to lay  down in bed and initiate mobility from sit to supine but still requires Max A   Transfers                    Ambulation/Gait                Stairs            Wheelchair Mobility    Modified Rankin (Stroke Patients Only)       Balance Overall balance assessment: Needs assistance Sitting-balance support: Feet unsupported;Bilateral upper extremity supported Sitting balance-Leahy Scale: Poor Sitting balance - Comments: sitting briefly without assistance.  Postural control: Right lateral lean                                   Pertinent Vitals/Pain Pain Assessment: Faces Faces Pain Scale: No hurt    Home Living Family/patient expects to be discharged to:: Skilled nursing facility                 Additional Comments: [facility].     Prior Function Level of Independence: Needs assistance   Gait / Transfers Assistance Needed: was independent with mobility  ADL's / Homemaking Assistance Needed: able to do her own bathing a dressing. Took her meals up to her room        Hand Dominance   Dominant Hand: Right    Extremity/Trunk Assessment   Upper  Extremity Assessment Upper Extremity Assessment: Defer to OT evaluation    Lower Extremity Assessment Lower Extremity Assessment: Difficult to assess due to impaired cognition       Communication   Communication: No difficulties  Cognition Arousal/Alertness: Lethargic Behavior During Therapy: Anxious Overall Cognitive Status: Impaired/Different from baseline Area of Impairment: Orientation;Attention;Memory;Following commands;Awareness                 Orientation Level: Disoriented to;Person;Place;Time;Situation Current Attention Level: Focused Memory: Decreased short-term memory Following Commands: Follows one step commands inconsistently   Awareness: Intellectual   General Comments: Pt is very sleepy. Unable to  get eyes to open with noxious stimuli or sitting EOB      General Comments General comments (skin integrity, edema, etc.): Pt is deaf and responds to noxious stimuli but does not open her eyes    Exercises     Assessment/Plan    PT Assessment Patient needs continued PT services  PT Problem List Decreased strength;Decreased range of motion;Decreased activity tolerance;Decreased balance;Decreased mobility;Decreased knowledge of use of DME;Pain       PT Treatment Interventions DME instruction;Gait training;Stair training;Functional mobility training;Therapeutic activities;Therapeutic exercise;Balance training;Patient/family education;Neuromuscular re-education    PT Goals (Current goals can be found in the Care Plan section)  Acute Rehab PT Goals Patient Stated Goal: none stated PT Goal Formulation: With family Time For Goal Achievement: 04/24/17 Potential to Achieve Goals: Fair    Frequency Min 3X/week   Barriers to discharge        Co-evaluation PT/OT/SLP Co-Evaluation/Treatment: Yes Reason for Co-Treatment: Complexity of the patient's impairments (multi-system involvement);For patient/therapist safety;Necessary to address cognition/behavior during functional activity PT goals addressed during session: Mobility/safety with mobility         AM-PAC PT "6 Clicks" Daily Activity  Outcome Measure Difficulty turning over in bed (including adjusting bedclothes, sheets and blankets)?: Total Difficulty moving from lying on back to sitting on the side of the bed? : Total Difficulty sitting down on and standing up from a chair with arms (e.g., wheelchair, bedside commode, etc,.)?: Total Help needed moving to and from a bed to chair (including a wheelchair)?: Total Help needed walking in hospital room?: Total Help needed climbing 3-5 steps with a railing? : Total 6 Click Score: 6    End of Session   Activity Tolerance: Patient limited by fatigue;Patient limited by  lethargy Patient left: in bed;with call bell/phone within reach;with bed alarm set;with family/visitor present Nurse Communication: Mobility status PT Visit Diagnosis: Muscle weakness (generalized) (M62.81);Hemiplegia and hemiparesis Hemiplegia - Right/Left: Right Hemiplegia - dominant/non-dominant: Dominant Hemiplegia - caused by: Other cerebrovascular disease    Time: 1610-9604 PT Time Calculation (min) (ACUTE ONLY): 33 min   Charges:   PT Evaluation $PT Eval Moderate Complexity: 1 Procedure     PT G Codes:        Colin Broach PT, DPT  [phone]   Ruel Favors Aletha Halim 04/10/2017, 1:40 PM

## 2017-04-10 NOTE — Progress Notes (Signed)
  Speech Language Pathology Treatment: Dysphagia  Patient Details Name: Laura [name] MRN: [id number] DOB: 09-Mar-1941 Today's Date: 04/10/2017 Time: [id number] SLP Time Calculation (min) (ACUTE ONLY): 15 min  Assessment / Plan / Recommendation Clinical Impression  Pt remaining very sleepy this am, not opening eyes, responding OK with max tactile communication from ASL interpreter. SLP washed face, repositioned, placed cup in hand, pt grasped cup, puckered lips and took rapid sips with appearance of ill-timed swallow with some gulping, possible delayed initiation with rapid rate of oral intake.  No coughing or signs of aspiration. Pts HR dropped to 30s/40s but remained responsive and HR came right back up. Called RN to room, MD also arrived, believed she had a vagal response. Sats stayed at 100. Recommend pt continue diet, but only feeding when pt is fully alert, eyes open responsive to care givers. Discussed with caregivers and RN. Will follow for tolerance.   HPI HPI: Pt is a 76 y/o F admitted from ALF with AMS, found to have L temporal lobe hematoma. PMH includes deafness (uses ASL), asperger's, cataracts, arthritis, HTN, HLD, anxiety      SLP Plan  Goals updated       Recommendations  Diet recommendations: Dysphagia 2 (fine chop);Thin liquid Liquids provided via: Cup;Straw Medication Administration: Whole meds with puree Supervision: Staff to assist with self feeding;Full supervision/cueing for compensatory strategies Compensations: Slow rate;Small sips/bites;Follow solids with liquid Postural Changes and/or Swallow Maneuvers: Seated upright 90 degrees;Upright 30-60 min after meal                Oral Care Recommendations: Oral care BID SLP Visit Diagnosis: Dysphagia, unspecified (R13.10) Plan: Goals updated       GO               Harlon DittyBonnie Cartha Rotert, MA CCC-SLP [phone]  Claudine MoutonDeBlois, Arron Mcnaught Caroline 04/10/2017, 9:19 AM

## 2017-04-10 NOTE — Progress Notes (Signed)
Patient ID: Laura [name], female   DOB: 06-10-1941, 76 y.o.   MRN: [id number] Subjective: Patient seems stable overall. Opens eyes to noxious stimuli. Localizes on the left. Flexes on the right. Pupils equal. Gives the okay sign.  Objective: Vital signs in last 24 hours: Temp:  [98.1 F (36.7 C)-100.9 F (38.3 C)] 99 F (37.2 C) (05/19 0541) Pulse Rate:  [81-102] 102 (05/19 0541) Resp:  [14-38] 31 (05/19 0541) BP: (135-190)/(47-107) 190/102 (05/19 0906) SpO2:  [95 %-98 %] 96 % (05/19 0541) Weight:  [59.6 kg (131 lb 6.4 oz)-60 kg (132 lb 4.8 oz)] 60 kg (132 lb 4.8 oz) (05/19 0549)  Intake/Output from previous day: 05/18 0701 - 05/19 0700 In: 1700 [P.O.:240; I.V.:1050; IV Piggyback:410] Out: 790 [Urine:790] Intake/Output this shift: Total I/O In: 280.8 [I.V.:180.8; IV Piggyback:100] Out: -     Lab Results: Lab Results  Component Value Date   WBC 11.3 (H) 04/10/2017   HGB 10.1 (L) 04/10/2017   HCT 31.7 (L) 04/10/2017   MCV 95.8 04/10/2017   PLT 246 04/10/2017   Lab Results  Component Value Date   INR 1.13 04/06/2017   BMET Lab Results  Component Value Date   NA 136 04/10/2017   K 3.9 04/10/2017   CL 107 04/10/2017   CO2 22 04/10/2017   GLUCOSE 110 (H) 04/10/2017   BUN 20 04/10/2017   CREATININE 0.65 04/10/2017   CALCIUM 7.5 (L) 04/10/2017    Studies/Results: No results found.  Assessment/Plan: Overall appears to be stable. Continue current management. Following.   LOS: 4 days    Laneka Mcgrory S 04/10/2017, 10:40 AM

## 2017-04-10 NOTE — Plan of Care (Signed)
Problem: Bowel/Gastric: Goal: Will not experience complications related to bowel motility Outcome: Not Progressing Patient has not had a bowel movement since prior to admission. Noted to provider for daily rounded

## 2017-04-10 NOTE — Progress Notes (Signed)
[facility] PROGRESS NOTE  MANEH SIEBEN ZOX:[id number] DOB: [birth year] DOA: 04/06/2017  PCP: Romero Belling, MD  Brief History/Interval Summary: 76 year old female who apparently lives at a assisted living facility who has a past medical history of deafness, autism, was brought in from her facility for unresponsiveness. Apparently she has been having nausea and vomiting. She was found to have a hematoma over her right occipital scalp area. Evaluation revealed a left temporal lobe intracranial hematoma along with a left-sided subdural hematoma. She had a fever when she was initially evaluated. She was placed on empiric antibiotics. Neurosurgery and neurology was consulted. She was admitted to the intensive care unit.  Reason for Visit: Intracranial hemorrhage  Consultants: Neurosurgery. Neurology  Procedures: None so far  Antibiotics: Unasyn  Subjective/Interval History: Patient with her eyes closed. Her healthcare power of attorney and the sign language interpreter is at the bedside. She still did not respond to all questions. She did sign that she was fine.   ROS: Unable to do  Objective:  Vital Signs  Vitals:   04/10/17 0516 04/10/17 0541 04/10/17 0549 04/10/17 0906  BP: (!) 148/47   (!) 190/102  Pulse: 98 (!) 102    Resp: (!) 38 (!) 31    Temp:  99 F (37.2 C)    TempSrc:  Axillary    SpO2: 95% 96%    Weight:   60 kg (132 lb 4.8 oz)   Height:        Intake/Output Summary (Last 24 hours) at 04/10/17 0915 Last data filed at 04/10/17 0737  Gross per 24 hour  Intake          1880.82 ml  Output              790 ml  Net          1090.82 ml   Filed Weights   04/09/17 0500 04/09/17 1810 04/10/17 0549  Weight: 54.5 kg (120 lb 2.4 oz) 59.6 kg (131 lb 6.4 oz) 60 kg (132 lb 4.8 oz)    General appearance: Appears to be in no distress Resp: Clear to auscultation bilaterally Cardio: S1, S2 is normal, regular. No S3, S4. No rubs, murmurs, or bruit GI: Abdomen soft.  Nontender, nondistended. Bowel sounds are present. No masses or organomegaly Extremities: No edema Neurologic: Does move all her extremities. Opens her eyes occasionally.  Lab Results:  Data Reviewed: I have personally reviewed following labs and imaging studies  CBC:  Recent Labs Lab 04/06/17 1213 04/06/17 1228 04/07/17 0316 04/08/17 0222 04/09/17 0340 04/10/17 0319  WBC 13.5*  --  13.3* 14.7* 14.3* 11.3*  NEUTROABS 11.5*  --   --   --   --   --   HGB 12.5 12.6 11.7* 11.2* 11.1* 10.1*  HCT 38.4 37.0 36.5 35.2* 35.4* 31.7*  MCV 94.3  --  96.3 97.0 97.3 95.8  PLT 235  --  197 205 232 246    Basic Metabolic Panel:  Recent Labs Lab 04/06/17 1213 04/06/17 1228 04/06/17 1819 04/07/17 0316 04/08/17 0222 04/09/17 0340 04/10/17 0319  NA 132* 137  --  133* 134* 140 136  K 3.8 3.4*  --  4.0 3.9 4.0 3.9  CL 98* 101  --  102 103 109 107  CO2 25  --   --  25 23 21* 22  GLUCOSE 167* 146*  --  135* 167* 128* 110*  BUN 16 16  --  10 14 22* 20  CREATININE 0.75 0.50  --  0.61 0.61 0.60 0.65  CALCIUM 8.2*  --   --  7.5* 7.8* 7.8* 7.5*  MG  --   --  1.7 1.7 1.8 2.2  --   PHOS  --   --  2.7  --   --  1.7*  --     GFR: Estimated Creatinine Clearance: 48.8 mL/min (by C-G formula based on SCr of 0.65 mg/dL).  Liver Function Tests:  Recent Labs Lab 04/06/17 1213  AST 30  ALT 19  ALKPHOS 69  BILITOT 0.7  PROT 7.1  ALBUMIN 3.8    Coagulation Profile:  Recent Labs Lab 04/06/17 1213  INR 1.13    Cardiac Enzymes:  Recent Labs Lab 04/06/17 1819  CKTOTAL 85     Recent Results (from the past 240 hour(s))  Culture, blood (Routine X 2) w Reflex to ID Panel     Status: None (Preliminary result)   Collection Time: 04/06/17  2:15 PM  Result Value Ref Range Status   Specimen Description BLOOD LEFT ANTECUBITAL  Final   Special Requests   Final    BOTTLES DRAWN AEROBIC AND ANAEROBIC Blood Culture adequate volume   Culture NO GROWTH 3 DAYS  Final   Report Status  PENDING  Incomplete  Culture, blood (Routine X 2) w Reflex to ID Panel     Status: None (Preliminary result)   Collection Time: 04/06/17  6:38 PM  Result Value Ref Range Status   Specimen Description BLOOD RIGHT HAND  Final   Special Requests   Final    BOTTLES DRAWN AEROBIC ONLY Blood Culture adequate volume   Culture NO GROWTH 3 DAYS  Final   Report Status PENDING  Incomplete  MRSA PCR Screening     Status: None   Collection Time: 04/06/17 10:37 PM  Result Value Ref Range Status   MRSA by PCR NEGATIVE NEGATIVE Final    Comment:        The GeneXpert MRSA Assay (FDA approved for NASAL specimens only), is one component of a comprehensive MRSA colonization surveillance program. It is not intended to diagnose MRSA infection nor to guide or monitor treatment for MRSA infections.       Radiology Studies: No results found.   Medications:  Scheduled: . levothyroxine  25 mcg Intravenous Daily  . mouth rinse  15 mL Mouth Rinse BID  . metoprolol tartrate  2.5 mg Intravenous Q6H   Continuous: . sodium chloride    . 0.9 % NaCl with KCl 40 mEq / L 50 mL/hr (04/09/17 1257)  . ampicillin-sulbactam (UNASYN) IV Stopped (04/10/17 0601)  . levETIRAcetam Stopped (04/10/17 0414)   AVW:UJWJXB chloride, acetaminophen, hydrALAZINE, metoprolol tartrate, ondansetron (ZOFRAN) IV  Assessment/Plan:  Active Problems:   CVA (cerebral vascular accident) (HCC)    Intracranial hemorrhage/subdural hematoma/acute encephalopathy Etiology is unclear. Could be related to trauma could be due to hypertension. Neurology and neurosurgery following. Patient's mental status appears to be stable. She did have an episode of bradycardia when speech therapy was assessing her swallow function. Heart rate rebounded quickly. She is on antiepileptic treatment with Keppra. At baseline patient lives in an assisted living facility. She has autism. Her intellectual level is that of her 76 year old.  Cervical neck  collar This was placed at the time of admission. CT of the cervical spine did not show any acute findings. It appears that the cervical neck collar was removed before she was transferred out of the ICU. She is able to move her head and neck without any  difficulty. No weakness appreciated in her arms or legs. No need for any further imaging studies at this time.   Aspiration pneumonia Continue Unasyn. Appears to be stable from a respiratory standpoint. Blood cultures are negative so far. Low-grade fever noted. Continue to monitor.  Essential hypertension Blood pressure was poorly controlled this morning. She was given hydralazine. Transition to oral medications.  History of hypothyroidism. Continue levothyroxine. Transition to oral.  Hypokalemia Potassium level is normal.  Vomiting This occurred at the assisted living facility. None noted here so far. Episode of vomiting could've been due to her intracranial hemorrhage.  History of autism and deafness. Stable  DVT Prophylaxis: SCDs    Code Status: DO NOT RESUSCITATE  Family Communication: Discussed in detail with her healthcare power of attorney. Disposition Plan: Management as outlined above. Start mobilizing.    LOS: 4 days   [facility]ISHNAN,Bence Trapp  [facility] Pager [phone] 04/10/2017, 9:15 AM  If 7PM-7AM, please contact night-coverage at [email or web address], password [email or web address]

## 2017-04-10 NOTE — Evaluation (Signed)
Occupational Therapy Evaluation Patient Details Name: Laura [name] MRN: [id number] DOB: Apr 15, 1941 Today's Date: 04/10/2017    History of Present Illness Pt is a 76 yo female admitted through ED on 04/06/17 unresponsive. [facility]. Pt was diagnosied with an ICH and subdural hematoma with acute encephalopathy possible due to a traumatic even. Pt has history of autism, anxiety, OA, HLD, hypothyroid.    Clinical Impression   Per pts HCPOA, pt was independent with ADL and mobility PTA. Currently pt overall max assist +2 for bed mobility and total assist for ADL. Pt did not open eyes during session despite noxious stimuli, tactile stimuli, and positional changes but was able to maintain sitting balance at EOB x7 minutes with min-min guard assist. Recommending CIR level therapies to maximize independence and safety with ADL and functional mobility prior to return to ILF. Pt would benefit from continued skilled OT to address established goals.    Follow Up Recommendations  CIR;Supervision/Assistance - 24 hour    Equipment Recommendations  Other (comment) (TBD at next venue)    Recommendations for Other Services Rehab consult     Precautions / Restrictions Precautions Precautions: Fall Restrictions Weight Bearing Restrictions: No      Mobility Bed Mobility Overal bed mobility: Needs Assistance Bed Mobility: Supine to Sit;Sit to Supine     Supine to sit: Max assist;+2 for physical assistance Sit to supine: Max assist;+2 for physical assistance   General bed mobility comments: Pt is Max A x2 to get EOB and able to sit EOB for 5 minutes before she initiates laying back to bed. Pt is able to lay down in bed and initiate mobility from sit to supine but still requires Max A   Transfers                      Balance Overall balance assessment: Needs assistance Sitting-balance support: Feet unsupported;Bilateral upper extremity supported Sitting  balance-Leahy Scale: Poor Sitting balance - Comments: sitting briefly without assistance.  Postural control: Right lateral lean                                 ADL either performed or assessed with clinical judgement   ADL Overall ADL's : Needs assistance/impaired                                       General ADL Comments: Pt currently total assist for ADL. Difficult to arouse despite noxious simuli, tactile stimuli, and positional changes. Did wipe her face with washcloth but never opened eyes.     Vision Baseline Vision/History: Wears glasses Wears Glasses: At all times       Perception     Praxis      Pertinent Vitals/Pain Pain Assessment: Faces Faces Pain Scale: No hurt     Hand Dominance Right   Extremity/Trunk Assessment Upper Extremity Assessment Upper Extremity Assessment: Difficult to assess due to impaired cognition (gross movement noted in bil UEs)   Lower Extremity Assessment Lower Extremity Assessment: Defer to PT evaluation   Cervical / Trunk Assessment Cervical / Trunk Assessment: Kyphotic   Communication Communication Communication: No difficulties   Cognition Arousal/Alertness: Lethargic Behavior During Therapy: Anxious Overall Cognitive Status: Impaired/Different from baseline Area of Impairment: Orientation;Attention;Memory;Following commands;Awareness  Orientation Level: Disoriented to;Person;Place;Time;Situation Current Attention Level: Focused Memory: Decreased short-term memory Following Commands: Follows one step commands inconsistently   Awareness: Intellectual   General Comments: Pt is very sleepy. Unable to get eyes to open with noxious stimuli or sitting EOB   General Comments  Pt is deaf and responds to noxious stimuli but does not open her eyes    Exercises     Shoulder Instructions      Home Living Family/patient expects to be discharged to:: Unsure                                  Additional Comments: previously lived in indepenent living facility at [facility].       Prior Functioning/Environment Level of Independence: Needs assistance  Gait / Transfers Assistance Needed: was independent with mobility ADL's / Homemaking Assistance Needed: able to do her own bathing a dressing. Took her meals up to her room Communication / Swallowing Assistance Needed: required simple commands due to being on the autism spectrum          OT Problem List: Decreased range of motion;Impaired balance (sitting and/or standing);Decreased cognition;Decreased knowledge of use of DME or AE      OT Treatment/Interventions: Self-care/ADL training;Neuromuscular education;Energy conservation;DME and/or AE instruction;Therapeutic exercise;Therapeutic activities;Patient/family education;Balance training    OT Goals(Current goals can be found in the care plan section) Acute Rehab OT Goals Patient Stated Goal: none stated OT Goal Formulation: Patient unable to participate in goal setting Time For Goal Achievement: 04/24/17 Potential to Achieve Goals: Good ADL Goals Pt Will Perform Grooming: with supervision;sitting;with set-up (x3 tasks) Pt Will Transfer to Toilet: with min assist;stand pivot transfer;bedside commode Additional ADL Goal #1: Pt will follow one step command 75% of the time in a minimally distracting environment.  OT Frequency: Min 3X/week   Barriers to D/C:            Co-evaluation PT/OT/SLP Co-Evaluation/Treatment: Yes Reason for Co-Treatment: Complexity of the patient's impairments (multi-system involvement);For patient/therapist safety;Necessary to address cognition/behavior during functional activity PT goals addressed during session: Mobility/safety with mobility OT goals addressed during session: ADL's and self-care      AM-PAC PT "6 Clicks" Daily Activity     Outcome Measure Help from another person eating meals?: Total Help  from another person taking care of personal grooming?: A Lot Help from another person toileting, which includes using toliet, bedpan, or urinal?: Total Help from another person bathing (including washing, rinsing, drying)?: Total Help from another person to put on and taking off regular upper body clothing?: Total Help from another person to put on and taking off regular lower body clothing?: Total 6 Click Score: 7   End of Session Nurse Communication: Mobility status;Other (comment) (unable to arouse pt)  Activity Tolerance: Patient limited by lethargy;Patient tolerated treatment well Patient left: in bed;with call bell/phone within reach;with bed alarm set;with family/visitor present  OT Visit Diagnosis: Muscle weakness (generalized) (M62.81);Cognitive communication deficit (R41.841)                Time: 1610-9604 OT Time Calculation (min): 34 min Charges:  OT General Charges $OT Visit: 1 Procedure OT Evaluation $OT Eval Moderate Complexity: 1 Procedure G-Codes:     Laura [name], M.S., OTR/L Pager: [phone]  Gaye Alken 04/10/2017, 2:03 PM

## 2017-04-10 NOTE — Progress Notes (Signed)
Pt has had a restful night, has had spontaneous eye opening and brief signing communication with caregiver at the bedside. Pt POA requested to call ASL agency to have them present for morning rounding with providers, must be scheduled ahead of time. Pt TMAX 100.9, given tylenol for the same, and has had mild tachypnea and productive cough through the shift. No BM since just PTA.

## 2017-04-11 LAB — BASIC METABOLIC PANEL
ANION GAP: 9 (ref 5–15)
BUN: 10 mg/dL (ref 6–20)
CHLORIDE: 101 mmol/L (ref 101–111)
CO2: 22 mmol/L (ref 22–32)
Calcium: 8 mg/dL — ABNORMAL LOW (ref 8.9–10.3)
Creatinine, Ser: 0.54 mg/dL (ref 0.44–1.00)
GFR calc Af Amer: >60 mL/min (ref >60)
GLUCOSE: 123 mg/dL — AB (ref 65–99)
POTASSIUM: 3.9 mmol/L (ref 3.5–5.1)
Sodium: 132 mmol/L — ABNORMAL LOW (ref 135–145)

## 2017-04-11 LAB — CULTURE, BLOOD (ROUTINE X 2)
CULTURE: NO GROWTH
Culture: NO GROWTH
SPECIAL REQUESTS: ADEQUATE
Special Requests: ADEQUATE

## 2017-04-11 LAB — CBC
HCT: 35 % — ABNORMAL LOW (ref 36.0–46.0)
Hemoglobin: 11.4 g/dL — ABNORMAL LOW (ref 12.0–15.0)
MCH: 30.9 pg (ref 26.0–34.0)
MCHC: 32.6 g/dL (ref 30.0–36.0)
MCV: 94.9 fL (ref 78.0–100.0)
PLATELETS: 258 10*3/uL (ref 150–400)
RBC: 3.69 MIL/uL — ABNORMAL LOW (ref 3.87–5.11)
RDW: 12.5 % (ref 11.5–15.5)
WBC: 10.7 10*3/uL — AB (ref 4.0–10.5)

## 2017-04-11 NOTE — NC FL2 (Signed)
[place] MEDICAID FL2 LEVEL OF CARE SCREENING TOOL     IDENTIFICATION  Patient Name: Laura [name] Birthdate: May 19, 1941 Sex: female Admission Date (Current Location): 04/06/2017  [facility] [place]   Facility and Address:  [facility], [address], [place], [place] [address]      Provider Number: [id number]  Attending Physician Name and Address:  Osvaldo ShipperKrishnan, Gokul, MD  Relative Name and Phone Number:       Current Level of Care: Hospital Recommended Level of Care: Other (Comment) (CIR- then [facility]) Prior Approval Number:    Date Approved/Denied:   PASRR Number:    Discharge Plan: Other (Comment) (?CIR vs ALF vs SNF)    Current Diagnoses: Patient Active Problem List   Diagnosis Date Noted  . CVA (cerebral vascular accident) (HCC) 04/06/2017  . Bleeding, intracranial (HCC)   . Sepsis (HCC)   . Subdural hematoma (HCC)   . Trauma   . Osteoporosis 03/23/2017  . Menopause 03/25/2016  . Hyperglycemia 08/07/2015  . Contusion of left chest wall 02/05/2014  . Routine general medical examination at a health care facility 01/19/2014  . Left ventricular hypertrophy 07/15/2011  . Encounter for long-term (current) use of other medications 06/29/2011  . 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 TRIGGER FINGER 10/30/2009  . Hypothyroidism 04/15/2009  . ABNORMAL ELECTROCARDIOGRAM 04/15/2009  . RESTLESS LEG SYNDROME 10/01/2008  . HEADACHE 08/27/2008  . TACHYCARDIA 08/27/2008  . Essential hypertension 05/07/2008  . Abnormal involuntary movement 11/21/2007  . ANXIETY STATE, UNSPECIFIED 10/10/2007  . Dyslipidemia 06/22/2007    Orientation RESPIRATION BLADDER Height & Weight     Self, Situation (May fluctuate in orientation due to autism)  Normal Continent (baseline) Weight: 134 lb 8 oz (61 kg) Height:  5\' 2"  (157.5 cm)  BEHAVIORAL SYMPTOMS/MOOD NEUROLOGICAL BOWEL NUTRITION STATUS   (None)    Continent (baseline) Diet (Dysphagia 2)  AMBULATORY STATUS COMMUNICATION OF NEEDS Skin   Extensive Assist Non-Verbally (Via Sign language) Other (Comment) (Scratch marks- arm, back. knee and legs)                       Personal Care Assistance Level of Assistance  Bathing, Dressing (Pt was independent of ADL's prior) Bathing Assistance: Limited assistance   Dressing Assistance: Limited assistance     Functional Limitations Info  Speech, Hearing (Deaf/mute)   Hearing Info: Impaired (Deaf) Speech Info: (S) Impaired (mute)    SPECIAL CARE FACTORS FREQUENCY  PT (By licensed PT), OT (By licensed OT) (Requires interpreter)                    Contractures Contractures Info: Not present    Additional Factors Info  Code Status, Allergies Code Status Info: DNR Allergies Info: No Known allergies           Current Medications (04/11/2017):  This is the current hospital active medication list Current Facility-Administered Medications  Medication Dose Route Frequency Provider Last Rate Last Dose  . 0.9 %  sodium chloride infusion  250 mL Intravenous PRN Ollis, Brandi L, NP      . 0.9 % NaCl with KCl 40 mEq / L  infusion   Intravenous Continuous Merwyn KatosSimonds, David B, MD 50 mL/hr at 04/10/17 1255 50 mL/hr at 04/10/17 1255  . acetaminophen (TYLENOL) suppository 650 mg  650 mg Rectal Q6H PRN Canary Brimllis, Brandi L, NP   650 mg at 04/10/17 0408  .  Ampicillin-Sulbactam (UNASYN) 3 g in sodium chloride 0.9 % 100 mL IVPB  3 g Intravenous Q8H Nelda Bucks, MD   Stopped at 04/11/17 [phone]  . hydrALAZINE (APRESOLINE) injection 10-40 mg  10-40 mg Intravenous Q4H PRN Canary Brim L, NP   20 mg at 04/11/17 0134  . levETIRAcetam (KEPPRA) 500 mg in sodium chloride 0.9 % 100 mL IVPB  500 mg Intravenous Q12H Costella, Darci Current, PA-C   Stopped at 04/11/17 0308  . levothyroxine (SYNTHROID, LEVOTHROID) tablet 50 mcg  50 mcg Oral QAC breakfast Osvaldo Shipper, MD   50 mcg at 04/11/17 [phone]  . MEDLINE  mouth rinse  15 mL Mouth Rinse BID Nelda Bucks, MD   15 mL at 04/10/17 2151  . metoprolol tartrate (LOPRESSOR) tablet 25 mg  25 mg Oral BID Osvaldo Shipper, MD   25 mg at 04/11/17 5409  . ondansetron (ZOFRAN) injection 4 mg  4 mg Intravenous Q8H PRN Jeanella Craze, NP         Discharge Medications: Please see discharge summary for a list of discharge medications.  Relevant Imaging Results:  Relevant Lab Results:   Additional Information SSN:  [id number]  Darylene Price, LCSW

## 2017-04-11 NOTE — Progress Notes (Signed)
Patient ID: Laura [name], female   DOB: June 26, 1941, 76 y.o.   MRN: [id number] Subjective: Patient is sitting up in bed in feeding herself breakfast today. She is smiling and interactive. She moves all extremities well.  Objective: Vital signs in last 24 hours: Temp:  [98.9 F (37.2 C)-99.5 F (37.5 C)] 99 F (37.2 C) (05/20 0357) Pulse Rate:  [64-98] 74 (05/20 0357) Resp:  [24-36] 27 (05/20 0357) BP: (146-187)/(70-91) 156/72 (05/20 0500) SpO2:  [87 %-97 %] 96 % (05/20 0357) Weight:  [61 kg (134 lb 8 oz)] 61 kg (134 lb 8 oz) (05/20 0457)  Intake/Output from previous day: 05/19 0701 - 05/20 0700 In: 1900 [P.O.:40; I.V.:1250; IV Piggyback:610] Out: 1900 [Urine:1900] Intake/Output this shift: No intake/output data recorded.    Lab Results: Lab Results  Component Value Date   WBC 10.7 (H) 04/11/2017   HGB 11.4 (L) 04/11/2017   HCT 35.0 (L) 04/11/2017   MCV 94.9 04/11/2017   PLT 258 04/11/2017   Lab Results  Component Value Date   INR 1.13 04/06/2017   BMET Lab Results  Component Value Date   NA 132 (L) 04/11/2017   K 3.9 04/11/2017   CL 101 04/11/2017   CO2 22 04/11/2017   GLUCOSE 123 (H) 04/11/2017   BUN 10 04/11/2017   CREATININE 0.54 04/11/2017   CALCIUM 8.0 (L) 04/11/2017    Studies/Results: No results found.  Assessment/Plan: Seems to be much better. Her loved ones are pleased with her progress   LOS: 5 days    JONES,DAVID S 04/11/2017, 9:20 AM

## 2017-04-11 NOTE — Progress Notes (Signed)
Pt still alert but drowsy - no other changes noted - Interpreter says she just keeps signing over and over that she wants to go home. Person keeps signing to her you are still sick & need to stay in the hospital.

## 2017-04-11 NOTE — Progress Notes (Signed)
Report called to 70M patient in stable condition resting with eyes closed and will be transferred on telemetry via bed by NT's.  Family and interpreter in room informed at this time

## 2017-04-11 NOTE — Progress Notes (Signed)
Pt rested well through the night. Pt was able to take her medication last night with applesauce, and ate the entire cup and had some water without choking, coughing or difficulty. Hydralazine administered x 2 for blood pressure management.

## 2017-04-11 NOTE — Progress Notes (Signed)
Review chart, currently max assist times 2 with poor participation with occupational therapy, max assist times 1 with physical therapy. Agree with SNF do not think she would progress to the point of returning to independent living.

## 2017-04-11 NOTE — Progress Notes (Signed)
[facility] PROGRESS NOTE  Laura [name] ZOX:[id number] DOB: 07/02/1941 DOA: 04/06/2017  PCP: Romero Belling, MD  Brief History/Interval Summary: 76 year old female who apparently lives at a assisted living facility who has a past medical history of deafness, autism, was brought in from her facility for unresponsiveness. Apparently she has been having nausea and vomiting. She was found to have a hematoma over her right occipital scalp area. Evaluation revealed a left temporal lobe intracranial hematoma along with a left-sided subdural hematoma. She had a fever when she was initially evaluated. She was placed on empiric antibiotics. Neurosurgery and neurology was consulted. She was admitted to the intensive care unit.  Reason for Visit: Intracranial hemorrhage  Consultants: Neurosurgery. Neurology  Procedures: None so far  Antibiotics: Unasyn  Subjective/Interval History: Sign language interpreter in the room. Patient awake and alert. States that she is feeling better. Denies any complaints. Denies any pain.    Objective:  Vital Signs  Vitals:   04/11/17 0800 04/11/17 0900 04/11/17 0958 04/11/17 1000  BP: (!) 166/72 (!) 167/88 (!) 167/72 (!) 157/77  Pulse: 83 87 74 97  Resp: (!) 21 (!) 24  (!) 24  Temp: 97.6 F (36.4 C)     TempSrc: Oral     SpO2: 95% 94%  94%  Weight:      Height:        Intake/Output Summary (Last 24 hours) at 04/11/17 1059 Last data filed at 04/11/17 0500  Gross per 24 hour  Intake          1619.18 ml  Output             1900 ml  Net          -280.82 ml   Filed Weights   04/09/17 1810 04/10/17 0549 04/11/17 0457  Weight: 59.6 kg (131 lb 6.4 oz) 60 kg (132 lb 4.8 oz) 61 kg (134 lb 8 oz)    General appearance: Awake, alert. No distress. Resp: Slightly diminished in the right base. No definite crackles or wheezing Cardio: S1, S2 is normal, regular. No S3, S4. No rubs, murmurs, bruits GI: Abdomen is soft. Nontender, nondistended. Bowel  sounds are present. No masses or organomegaly Extremities: No edema Neurologic: Moving all her extremities. No facial asymmetry.  Lab Results:  Data Reviewed: I have personally reviewed following labs and imaging studies  CBC:  Recent Labs Lab 04/06/17 1213  04/07/17 0316 04/08/17 0222 04/09/17 0340 04/10/17 0319 04/11/17 0322  WBC 13.5*  --  13.3* 14.7* 14.3* 11.3* 10.7*  NEUTROABS 11.5*  --   --   --   --   --   --   HGB 12.5  < > 11.7* 11.2* 11.1* 10.1* 11.4*  HCT 38.4  < > 36.5 35.2* 35.4* 31.7* 35.0*  MCV 94.3  --  96.3 97.0 97.3 95.8 94.9  PLT 235  --  197 205 232 246 258  < > = values in this interval not displayed.  Basic Metabolic Panel:  Recent Labs Lab 04/06/17 1819 04/07/17 0316 04/08/17 0222 04/09/17 0340 04/10/17 0319 04/11/17 0322  NA  --  133* 134* 140 136 132*  K  --  4.0 3.9 4.0 3.9 3.9  CL  --  102 103 109 107 101  CO2  --  25 23 21* 22 22  GLUCOSE  --  135* 167* 128* 110* 123*  BUN  --  10 14 22* 20 10  CREATININE  --  0.61 0.61 0.60 0.65 0.54  CALCIUM  --  7.5* 7.8* 7.8* 7.5* 8.0*  MG 1.7 1.7 1.8 2.2  --   --   PHOS 2.7  --   --  1.7*  --   --     GFR: Estimated Creatinine Clearance: 53.1 mL/min (by C-G formula based on SCr of 0.54 mg/dL).  Liver Function Tests:  Recent Labs Lab 04/06/17 1213  AST 30  ALT 19  ALKPHOS 69  BILITOT 0.7  PROT 7.1  ALBUMIN 3.8    Coagulation Profile:  Recent Labs Lab 04/06/17 1213  INR 1.13    Cardiac Enzymes:  Recent Labs Lab 04/06/17 1819  CKTOTAL 85     Recent Results (from the past 240 hour(s))  Culture, blood (Routine X 2) w Reflex to ID Panel     Status: None (Preliminary result)   Collection Time: 04/06/17  2:15 PM  Result Value Ref Range Status   Specimen Description BLOOD LEFT ANTECUBITAL  Final   Special Requests   Final    BOTTLES DRAWN AEROBIC AND ANAEROBIC Blood Culture adequate volume   Culture NO GROWTH 4 DAYS  Final   Report Status PENDING  Incomplete  Culture,  blood (Routine X 2) w Reflex to ID Panel     Status: None (Preliminary result)   Collection Time: 04/06/17  6:38 PM  Result Value Ref Range Status   Specimen Description BLOOD RIGHT HAND  Final   Special Requests   Final    BOTTLES DRAWN AEROBIC ONLY Blood Culture adequate volume   Culture NO GROWTH 4 DAYS  Final   Report Status PENDING  Incomplete  MRSA PCR Screening     Status: None   Collection Time: 04/06/17 10:37 PM  Result Value Ref Range Status   MRSA by PCR NEGATIVE NEGATIVE Final    Comment:        The GeneXpert MRSA Assay (FDA approved for NASAL specimens only), is one component of a comprehensive MRSA colonization surveillance program. It is not intended to diagnose MRSA infection nor to guide or monitor treatment for MRSA infections.       Radiology Studies: No results found.   Medications:  Scheduled: . levothyroxine  50 mcg Oral QAC breakfast  . mouth rinse  15 mL Mouth Rinse BID  . metoprolol tartrate  25 mg Oral BID   Continuous: . sodium chloride    . 0.9 % NaCl with KCl 40 mEq / L 50 mL/hr (04/10/17 1255)  . ampicillin-sulbactam (UNASYN) IV Stopped (04/11/17 0734)  . levETIRAcetam Stopped (04/11/17 0308)   QIO:NGEXBM chloride, acetaminophen, hydrALAZINE, ondansetron (ZOFRAN) IV  Assessment/Plan:  Active Problems:   CVA (cerebral vascular accident) (HCC)    Intracranial hemorrhage/subdural hematoma/acute encephalopathy Etiology is unclear. Could be related to trauma or could be due to hypertension. Neurology and neurosurgery following. Patient's mental status has improved significantly. No further episodes of bradycardia. Continue Keppra. At baseline patient lives in an assisted living facility. She has autism. Her intellectual level is that of her 76 year old.  Aspiration pneumonia Continue Unasyn. Appears to be stable from a respiratory standpoint. Blood cultures are negative so far. Low-grade fever noted occasionally. Continue to monitor.  Aspiration percussions. Possible transition to oral antibiotics tomorrow.  Essential hypertension Blood pressure remains elevated. Continue metoprolol. Hydralazine as needed. May need further titration.   History of hypothyroidism. Continue levothyroxine.   Hypokalemia Potassium level is normal.  Vomiting This occurred at the assisted living facility. None noted here so far. Episode of vomiting could've been due to  her intracranial hemorrhage.  History of autism and deafness. Stable  Cervical neck collar This was placed at the time of admission. CT of the cervical spine did not show any acute findings. It appears that the cervical neck collar was removed before she was transferred out of the ICU. She is able to move her head and neck without any difficulty. No weakness appreciated in her arms or legs. No need for any further imaging studies at this time.   DVT Prophylaxis: SCDs    Code Status: DO NOT RESUSCITATE  Family Communication: No family at bedside. Disposition Plan: Management as outlined above. Okay for transfer to floor. PT/OT evaluation.    LOS: 5 days   [facility] LPKRISHNAN,Deissy Guilbert  [facility] Pager [phone] 04/11/2017, 10:59 AM  If 7PM-7AM, please contact night-coverage at [email or web address], password [email or web address]

## 2017-04-11 NOTE — Progress Notes (Signed)
Attempted Report neither RN nor Consulting civil engineerCharge RN could take report at this time

## 2017-04-11 NOTE — Clinical Social Work Note (Signed)
Clinical Social Work Assessment  Patient Details  Name: Laura [name] MRN: [id number] Date of Birth: November 24, 1940  Date of referral:  04/11/17               Reason for consult:  Facility Placement (From [facility])                Permission sought to share information with:  Other (HCPOA- Laura [name]) Permission granted to share information::  No (HCPOA- Laura [name] in room- interpreter in room.)  Name::     Laura [name], [place]- Pt is Autistic, deaf       Housing/Transportation Living arrangements for the past 2 months:  [place] ([place]) Source of Information:  Power of Press photographer w/ interpreter for patient) Patient Interpreter Needed:  Sign Language Criminal Activity/Legal Involvement Pertinent to Current Situation/Hospitalization:  No - Comment as needed Significant Relationships:  Other(Comment) ([employer]- and Emergency planning/management officer for [facility] and [facility]) Lives with:  Facility Resident (has own apartment in IDL) Do you feel safe going back to the place where you live?  Yes (Hopefully after CIR stay; if not may need increase level of care to either ALF or SNF) Need for family participation in patient care:  Yes (Comment) (Pt is autistic and deaf)  Care giving concerns:  Pt lives alone at [place]. Has been physically functional prior to hospitalization- able to go with other residents out of the facility and providing for her self care per HCPOA.     Social Worker assessment / plan:  SW met with patient along with Interpreter and patient's [place] this morning.  Patient is deaf and autistic. She was able to respond some via interpreter but otherwise was very quiet and would simply watch the interpreter.  Information provided through [facility] and interpreter translated to patient.  Discussed that per PT- CIR referral is recommended for this patient and HCPOA is very pleased  with this. She wants patient to be able to return to her independent living environment and feels that having access to interpreters in the hospital has greatly helped with communication of patient.  She is hoping that CIR will be able to accept patient who has worked hard to remain in an independent living environment. She faces challenges due to her autism and being deaf but has managed well thus far.  HCPOA requests strong advocacy of SW services to encourage CIR placement.  If not- she will need to work with Arlina Robes to determine if move short to ALF would be feasible.  Fl2 initiated and placed on chart just in case patient is not able to go to CIR.   Employment status:  Disabled (Comment on whether or not currently receiving Disability) Insurance information:  Medicare PT Recommendations:  Inpatient Rehab Consult Information / Referral to community resources:  Other (Comment Required) (Interpreter services required)  Patient/Family's Response to care:  Despite having interpreter present- patient did not respond during visit.  HCPOA is very pleased with current care and appreciative of interpreter services. While she is a sign language interpreter- in her capacity as HCPOA she cannot intermingle those roles. When acting as HCPOA- an interpreter is required.    Patient/Family's Understanding of and Emotional Response to Diagnosis, Current Treatment, and Prognosis:  HCPOA is very well informed about patient's conditions and medical needs and exhibits strong support for patient to obtain the best services possible for patient.  Unable to  assess patient's understanding due to autism and deafness.  She was noted to be calm and relaxed during visit and even laughed a little during interpreting session. Very pleasant lady.  Emotional Assessment Appearance:  Appears younger than stated age Attitude/Demeanor/Rapport:  Unable to Assess (Pt is autistic and deaf) Affect (typically observed):    (Difficult to assess- pt is autistic and deaf) Orientation:  Fluctuating Orientation (Suspected and/or reported Sundowners), Oriented to Self, Oriented to Situation, Oriented to Place (due to autism- unsure) Alcohol / Substance use:  Never Used Psych involvement (Current and /or in the community):  No (Comment)  Discharge Needs  Concerns to be addressed:  Care Coordination (Will need continued interpreter services) Readmission within the last 30 days:  No Current discharge risk:   (Lives alone in IDL- deaf/autistic) Barriers to Discharge:  Continued Medical Work up (awaiting CIR work up)   Kendell Bane T, LCSW 04/11/2017, 10:37 AM

## 2017-04-11 NOTE — Progress Notes (Signed)
Text page Dr Rito EhrlichKrishnan to make aware that interpreter says she believes pt is not quite as alert as this early am. Only change in neuro status is that pt keeps signing I want to go home-

## 2017-04-12 MED ORDER — LEVETIRACETAM 500 MG PO TABS
500.0000 mg | ORAL_TABLET | Freq: Two times a day (BID) | ORAL | Status: DC
  Administered 2017-04-12 – 2017-04-13 (×2): 500 mg via ORAL
  Filled 2017-04-12 (×2): qty 1

## 2017-04-12 MED ORDER — AMLODIPINE BESYLATE 5 MG PO TABS
5.0000 mg | ORAL_TABLET | Freq: Every day | ORAL | Status: DC
  Administered 2017-04-12: 5 mg via ORAL
  Filled 2017-04-12 (×2): qty 1

## 2017-04-12 MED ORDER — AMOXICILLIN-POT CLAVULANATE 875-125 MG PO TABS
1.0000 | ORAL_TABLET | Freq: Two times a day (BID) | ORAL | Status: DC
  Administered 2017-04-12 – 2017-04-13 (×3): 1 via ORAL
  Filled 2017-04-12 (×3): qty 1

## 2017-04-12 NOTE — Progress Notes (Signed)
Occupational Therapy Treatment Patient Details Name: Laura [name] MRN: [id number] DOB: May 02, 1941 Today's Date: 04/12/2017    History of present illness Pt is a 76 yo female admitted through ED on 04/06/17 unresponsive. Pt was living in an [place] at [facility]. Pt was diagnosied with an ICH and subdural hematoma with acute encephalopathy possible due to a traumatic even. Pt has history of autism, anxiety, OA, HLD, hypothyroid.    OT comments  Pt demonstrating improvement toward OT goals this session and has met 2/3 initial goals. Goals updated in care plans. Pt able to complete seated grooming tasks this session demonstrating sustained attention for approximately 5 minutes. She remains distracted at times. She required cues for sequencing and continuation at times but was able to demonstrate improving problem solving skills this date. D/C plan remains appropriate. OT will continue to follow acutely.    Follow Up Recommendations  CIR;Supervision/Assistance - 24 hour    Equipment Recommendations  Other (comment) (TBD at next venue of care)    Recommendations for Other Services Rehab consult    Precautions / Restrictions Precautions Precautions: Fall Restrictions Weight Bearing Restrictions: No       Mobility Bed Mobility               General bed mobility comments: OOB in chair on OT arrival  Transfers                 General transfer comment: Session focused on seated ADL participation    Balance                                           ADL either performed or assessed with clinical judgement   ADL Overall ADL's : Needs assistance/impaired     Grooming: Oral care;Brushing hair;Wash/dry face;Cueing for sequencing;Sitting;Supervision/safety;Set up                                 General ADL Comments: Able to complete seated ADL with cues for sequencing and continuation of tasks. Able to complete oral care, face washing,  apply lotion, and brush hair during session. Demosntrated good communication this session. However, continues to attempt to pull lines.      Vision   Additional Comments: Able to maintain eyes open for majority of session. Able to make eye-contact for communication throughout session.    Perception     Praxis      Cognition Arousal/Alertness: Awake/alert Behavior During Therapy: WFL for tasks assessed/performed Overall Cognitive Status: Impaired/Different from baseline Area of Impairment: Attention;Following commands;Awareness;Problem solving                   Current Attention Level: Sustained Memory: Decreased short-term memory Following Commands: Follows one step commands with increased time   Awareness: Intellectual Problem Solving: Slow processing General Comments: Pt  alert this session although closing eyes at times. Able to problem solve opening lotion bottle cap with increased time and multiple attempts.        Exercises     Shoulder Instructions       General Comments      Pertinent Vitals/ Pain       Pain Assessment: Faces Faces Pain Scale: No hurt  Home Living  Prior Functioning/Environment              Frequency  Min 3X/week        Progress Toward Goals  OT Goals(current goals can now be found in the care plan section)  Progress towards OT goals: Progressing toward goals;Goals met and updated - see care plan (met 2/3)  Acute Rehab OT Goals Patient Stated Goal: none stated OT Goal Formulation: Patient unable to participate in goal setting Time For Goal Achievement: 04/24/17 Potential to Achieve Goals: Good ADL Goals Pt Will Perform Grooming: with supervision;standing (3 consecutive tasks) Additional ADL Goal #1: Pt will follow multi-step commands 50% of the time in a minimally distracting environment.   Plan Discharge plan remains appropriate    Co-evaluation                  AM-PAC PT "6 Clicks" Daily Activity     Outcome Measure   Help from another person eating meals?: A Little Help from another person taking care of personal grooming?: A Little Help from another person toileting, which includes using toliet, bedpan, or urinal?: A Lot Help from another person bathing (including washing, rinsing, drying)?: A Lot Help from another person to put on and taking off regular upper body clothing?: A Lot Help from another person to put on and taking off regular lower body clothing?: A Lot 6 Click Score: 14    End of Session    OT Visit Diagnosis: Muscle weakness (generalized) (M62.81);Cognitive communication deficit (R41.841)   Activity Tolerance Patient tolerated treatment well   Patient Left in chair;with call bell/phone within reach;with family/visitor present   Nurse Communication Mobility status        Time: 1020-1045 OT Time Calculation (min): 25 min  Charges: OT General Charges $OT Visit: 1 Procedure OT Treatments $Self Care/Home Management : 23-37 mins  Norman Herrlich, MS OTR/L  Pager: [place] 04/12/2017, 12:43 PM

## 2017-04-12 NOTE — Progress Notes (Signed)
Physical Therapy Treatment Patient Details Name: Laura [name] MRN: [id number] DOB: 1941/11/05 Today's Date: 04/12/2017    History of Present Illness Pt is a 76 yo female admitted through ED on 04/06/17 unresponsive. [facility] Pt was diagnosied with an ICH and subdural hematoma with acute encephalopathy possible due to a traumatic even. Pt has history of autism, anxiety, OA, HLD, hypothyroid.     PT Comments    Pt progressing towards physical therapy goals. Was able to perform transfers and ambulation with +2 assist for balance support and safety. ASL interpreter utilized throughout session for cueing and communication. Pt required increased time initially to focus on session and mobility. Friends present to assist with pt mobility and be there with pt when interpreter left.  Will continue to follow and progress as able per POC.   Follow Up Recommendations  CIR     Equipment Recommendations  None recommended by PT (defer to next venue)    Recommendations for Other Services Rehab consult     Precautions / Restrictions Precautions Precautions: Fall Restrictions Weight Bearing Restrictions: No    Mobility  Bed Mobility Overal bed mobility: Needs Assistance Bed Mobility: Supine to Sit     Supine to sit: Min assist;+2 for physical assistance     General bed mobility comments: Interpreter cues and tactile cues utilized for pt to transition to EOB. Rails used for UE support and bed pad scooted along with pt.   Transfers Overall transfer level: Needs assistance   Transfers: Sit to/from Stand Sit to Stand: Min assist         General transfer comment: Tactile and interpreter cues to power-up to full standing position. x3 throughout session.   Ambulation/Gait Ambulation/Gait assistance: Min assist;+2 safety/equipment Ambulation Distance (Feet): 5 Feet Assistive device: 2 person hand held assist Gait Pattern/deviations: Step-through  pattern;Decreased stride length;Trunk flexed Gait velocity: Decreased Gait velocity interpretation: Below normal speed for age/gender General Gait Details: +2 utilized for ambulation bed>chair. Generally appeared unsteady and uncoordinated but no gross LOB noted.    Stairs            Wheelchair Mobility    Modified Rankin (Stroke Patients Only)       Balance Overall balance assessment: Needs assistance Sitting-balance support: Feet unsupported;Bilateral upper extremity supported Sitting balance-Leahy Scale: Poor Sitting balance - Comments: sitting without assistance EOB Postural control: Right lateral lean Standing balance support: Bilateral upper extremity supported Standing balance-Leahy Scale: Poor Standing balance comment: BUE support required                            Cognition Arousal/Alertness: Awake/alert Behavior During Therapy: WFL for tasks assessed/performed Overall Cognitive Status: Impaired/Different from baseline Area of Impairment: Attention;Following commands;Awareness;Problem solving                   Current Attention Level: Sustained Memory: Decreased short-term memory Following Commands: Follows one step commands with increased time   Awareness: Intellectual Problem Solving: Slow processing General Comments: Pt alert this session although closing eyes at times. Increased effort from interpreter at times for pt to focus attention on her while attempting to sign.       Exercises      General Comments        Pertinent Vitals/Pain Pain Assessment: Faces Faces Pain Scale: No hurt    Home Living  Prior Function            PT Goals (current goals can now be found in the care plan section) Acute Rehab PT Goals Patient Stated Goal: none stated PT Goal Formulation: With family Time For Goal Achievement: 04/24/17 Potential to Achieve Goals: Fair Progress towards PT goals: Progressing toward  goals    Frequency    Min 3X/week      PT Plan Current plan remains appropriate    Co-evaluation              AM-PAC PT "6 Clicks" Daily Activity  Outcome Measure  Difficulty turning over in bed (including adjusting bedclothes, sheets and blankets)?: Total Difficulty moving from lying on back to sitting on the side of the bed? : Total Difficulty sitting down on and standing up from a chair with arms (e.g., wheelchair, bedside commode, etc,.)?: Total Help needed moving to and from a bed to chair (including a wheelchair)?: Total Help needed walking in hospital room?: Total Help needed climbing 3-5 steps with a railing? : Total 6 Click Score: 6    End of Session Equipment Utilized During Treatment: Gait belt;Oxygen Activity Tolerance: Patient limited by fatigue;Patient limited by lethargy Patient left: in bed;with call bell/phone within reach;with bed alarm set;with family/visitor present Nurse Communication: Mobility status PT Visit Diagnosis: Muscle weakness (generalized) (M62.81);Hemiplegia and hemiparesis Hemiplegia - Right/Left: Right Hemiplegia - dominant/non-dominant: Dominant Hemiplegia - caused by: Other cerebrovascular disease     Time: 0825-0857 PT Time Calculation (min) (ACUTE ONLY): 32 min  Charges:  $Gait Training: 23-37 mins                    G Codes:       Conni Slipper, PT, DPT Acute Rehabilitation Services Pager: [phone]    Marylynn Pearson 04/12/2017, 2:30 PM

## 2017-04-12 NOTE — Care Management Important Message (Signed)
Important Message  Patient Details  Name: Laura [name] MRN: [id number] Date of Birth: 1941-01-07   Medicare Important Message Given:  Yes    [place] Alfredo Stefan ChurchBratton 04/12/2017, 1:31 PM

## 2017-04-12 NOTE — Progress Notes (Signed)
  Speech Language Pathology Treatment: Dysphagia  Patient Details Name: Laura [name] MRN: [id number] DOB: 1941/06/21 Today's Date: 04/12/2017 Time: [id number] SLP Time Calculation (min) (ACUTE ONLY): 9 min  Assessment / Plan / Recommendation Clinical Impression  Pt was alert and upright in her chair for PO trials. Pt consumed advanced trials of soft solids with large, impulsive bolus sizes despite Mod cues. Pt did not proceed to take additional bites until her mouth was cleared, but her friend that is present shares that she was having to cue her intermittently to swallow and not hold onto boluses in her mouth. No overt s/s of aspiration were observed, although she did have frequent eructation. Recommend to continue with current chopped diet to facilitate oral preparation and clearance.    HPI HPI: Pt is a 76 y/o F admitted from ALF with AMS, found to have L temporal lobe hematoma. PMH includes deafness (uses ASL), asperger's, cataracts, arthritis, HTN, HLD, anxiety      SLP Plan  Continue with current plan of care       Recommendations  Diet recommendations: Dysphagia 2 (fine chop);Thin liquid Liquids provided via: Cup;Straw Medication Administration: Whole meds with puree Supervision: Full supervision/cueing for compensatory strategies;Patient able to self feed Compensations: Slow rate;Small sips/bites;Follow solids with liquid Postural Changes and/or Swallow Maneuvers: Seated upright 90 degrees;Upright 30-60 min after meal                Oral Care Recommendations: Oral care BID Follow up Recommendations: Inpatient Rehab SLP Visit Diagnosis: Dysphagia, unspecified (R13.10) Plan: Continue with current plan of care       GO                Laura [name], Laura [name] 04/12/2017, 3:07 PM  Laura [name], M.A. CCC-SLP [phone]

## 2017-04-12 NOTE — Progress Notes (Signed)
[facility] PROGRESS NOTE  Laura [name]:[id number] DOB: [birth year] DOA: 04/06/2017  PCP: Romero Belling, MD  Brief History/Interval Summary: 76 year old female who apparently lives at a assisted living facility who has a past medical history of deafness, autism, was brought in from her facility for unresponsiveness. Apparently she has been having nausea and vomiting. She was found to have a hematoma over her right occipital scalp area. Evaluation revealed a left temporal lobe intracranial hematoma along with a left-sided subdural hematoma. She had a fever when she was initially evaluated. She was placed on empiric antibiotics. Neurosurgery and neurology was consulted. She was admitted to the intensive care unit.  Reason for Visit: Intracranial hemorrhage  Consultants: Neurosurgery. Neurology  Procedures: None so far  Antibiotics: Unasyn will be changed to Augmentin 5/21  Subjective/Interval History: Sign language interpreter in the room. Patient states that she is feeling better. Denies any complaints.    Objective:  Vital Signs  Vitals:   04/11/17 2000 04/11/17 2140 04/12/17 0150 04/12/17 0615  BP: (!) 165/78 (!) 175/87 (!) 174/79 (!) 172/75  Pulse: 79 81 77 72  Resp: (!) 26 (!) 28 (!) 24 (!) 24  Temp:  98.7 F (37.1 C) 98.5 F (36.9 C) 98.5 F (36.9 C)  TempSrc:  Axillary Axillary Axillary  SpO2: 98% 96% 98% 100%  Weight:  64.3 kg (141 lb 11.2 oz)    Height:  5\' 3"  (1.6 m)      Intake/Output Summary (Last 24 hours) at 04/12/17 0902 Last data filed at 04/11/17 2027  Gross per 24 hour  Intake              521 ml  Output              700 ml  Net             -179 ml   Filed Weights   04/10/17 0549 04/11/17 0457 04/11/17 2140  Weight: 60 kg (132 lb 4.8 oz) 61 kg (134 lb 8 oz) 64.3 kg (141 lb 11.2 oz)    General appearance:Awake and alert. No distress. Resp: Improved air entry bilaterally. Few crackles right base. No wheezing Cardio: S1, S2 normal.  Regular. No S3, S4. No rubs, or murmurs GI: Abdomen is soft. Nontender, nondistended. Bowel sounds are present. No masses, organomegaly Extremities: No edema Neurologic: Moving all her extremities. No facial asymmetry  Lab Results:  Data Reviewed: I have personally reviewed following labs and imaging studies  CBC:  Recent Labs Lab 04/06/17 1213  04/07/17 0316 04/08/17 0222 04/09/17 0340 04/10/17 0319 04/11/17 0322  WBC 13.5*  --  13.3* 14.7* 14.3* 11.3* 10.7*  NEUTROABS 11.5*  --   --   --   --   --   --   HGB 12.5  < > 11.7* 11.2* 11.1* 10.1* 11.4*  HCT 38.4  < > 36.5 35.2* 35.4* 31.7* 35.0*  MCV 94.3  --  96.3 97.0 97.3 95.8 94.9  PLT 235  --  197 205 232 246 258  < > = values in this interval not displayed.  Basic Metabolic Panel:  Recent Labs Lab 04/06/17 1819 04/07/17 0316 04/08/17 0222 04/09/17 0340 04/10/17 0319 04/11/17 0322  NA  --  133* 134* 140 136 132*  K  --  4.0 3.9 4.0 3.9 3.9  CL  --  102 103 109 107 101  CO2  --  25 23 21* 22 22  GLUCOSE  --  135* 167* 128* 110* 123*  BUN  --  10 14 22* 20 10  CREATININE  --  0.61 0.61 0.60 0.65 0.54  CALCIUM  --  7.5* 7.8* 7.8* 7.5* 8.0*  MG 1.7 1.7 1.8 2.2  --   --   PHOS 2.7  --   --  1.7*  --   --     GFR: Estimated Creatinine Clearance: 55.7 mL/min (by C-G formula based on SCr of 0.54 mg/dL).  Liver Function Tests:  Recent Labs Lab 04/06/17 1213  AST 30  ALT 19  ALKPHOS 69  BILITOT 0.7  PROT 7.1  ALBUMIN 3.8    Coagulation Profile:  Recent Labs Lab 04/06/17 1213  INR 1.13    Cardiac Enzymes:  Recent Labs Lab 04/06/17 1819  CKTOTAL 85     Recent Results (from the past 240 hour(s))  Culture, blood (Routine X 2) w Reflex to ID Panel     Status: None   Collection Time: 04/06/17  2:15 PM  Result Value Ref Range Status   Specimen Description BLOOD LEFT ANTECUBITAL  Final   Special Requests   Final    BOTTLES DRAWN AEROBIC AND ANAEROBIC Blood Culture adequate volume   Culture NO  GROWTH 5 DAYS  Final   Report Status 04/11/2017 FINAL  Final  Culture, blood (Routine X 2) w Reflex to ID Panel     Status: None   Collection Time: 04/06/17  6:38 PM  Result Value Ref Range Status   Specimen Description BLOOD RIGHT HAND  Final   Special Requests   Final    BOTTLES DRAWN AEROBIC ONLY Blood Culture adequate volume   Culture NO GROWTH 5 DAYS  Final   Report Status 04/11/2017 FINAL  Final  MRSA PCR Screening     Status: None   Collection Time: 04/06/17 10:37 PM  Result Value Ref Range Status   MRSA by PCR NEGATIVE NEGATIVE Final    Comment:        The GeneXpert MRSA Assay (FDA approved for NASAL specimens only), is one component of a comprehensive MRSA colonization surveillance program. It is not intended to diagnose MRSA infection nor to guide or monitor treatment for MRSA infections.       Radiology Studies: No results found.   Medications:  Scheduled: . amLODipine  5 mg Oral Daily  . amoxicillin-clavulanate  1 tablet Oral Q12H  . levETIRAcetam  500 mg Oral BID  . levothyroxine  50 mcg Oral QAC breakfast  . mouth rinse  15 mL Mouth Rinse BID  . metoprolol tartrate  25 mg Oral BID   Continuous: . sodium chloride 250 mL (04/11/17 1224)   ZOX:WRUEAVPRN:sodium chloride, acetaminophen, hydrALAZINE, ondansetron (ZOFRAN) IV  Assessment/Plan:  Active Problems:   CVA (cerebral vascular accident) (HCC)    Intracranial hemorrhage/subdural hematoma/acute encephalopathy Could be related to trauma or could be due to hypertension. Neurology and neurosurgery were following. Patient's mental status is improved. Patient did not require surgical intervention. Continue Keppra, but change to oral. Follow-up with neurosurgery in 4 weeks. At baseline patient lives in an assisted living facility. She has autism. Her intellectual level is that of her 76 year old.  Aspiration pneumonia Seems to have improved. Speech therapist has seen. On dysphagia 2 diet with liquids. Aspiration  precautions. Change Unasyn to Augmentin.   Essential hypertension Continue metoprolol. Add amlodipine. Monitor blood pressures closely. Hydralazine as needed.   History of hypothyroidism. Continue levothyroxine.   Hypokalemia Repleted  Vomiting This occurred at the assisted living facility. None noted here so  far. Episode of vomiting could've been due to her intracranial hemorrhage.  History of autism and deafness. Stable  DVT Prophylaxis: SCDs    Code Status: DO NOT RESUSCITATE  Family Communication: No family at bedside. Disposition Plan: Management as outlined above. Social worker consult for skilled nursing facility placement. Discussed with caregivers.    LOS: 6 days   [facility]  [facility] Pager [phone] 04/12/2017, 9:02 AM  If 7PM-7AM, please contact night-coverage at [email or web address], password [facility]

## 2017-04-12 NOTE — Care Management Note (Deleted)
Case Management Note  Patient Details  Name: Laura [name] MRN: [id number] Date of Birth: 07-18-1941  Subjective/Objective:                    Action/Plan: Pt discharging home with self care. No f/u per PT/OT. [facility]. Pt states they try and keep her medications affordable. CM provided her coupons for her new medications.  Pt also started on Eliquis. CM provided her a 30 day free card and $10 copay card. Patient to call the Eliquis number to see if she qualifies for the assistance.  Pt has transportation home.   Expected Discharge Date:                  Expected Discharge Plan:  Assisted Living / Rest Home  In-House Referral:  Clinical Social Work  Discharge planning Services  CM Consult  Post Acute Care Choice:    Choice offered to:     DME Arranged:    DME Agency:     HH Arranged:    HH Agency:     Status of Service:  Completed, signed off  If discussed at [employer] Meetings, dates discussed:    Additional Comments:  Kermit BaloKelli F Shawnte Winton, RN 04/12/2017, 8:04 PM

## 2017-04-12 NOTE — Consult Note (Signed)
[facility] CM Primary Care Navigator  04/12/2017  Laura [name] 07-11-1941 [id number]   Met with patient and sitter/ sign language interpreter at the bedside to identify possible discharge needs. Patient's sitter/ interpreter directed Probation officer to talk to patient's power of attorney. Patient is "Deaf" with a sitter/ sign language interpreter (provided by family) at the bedside. Called and spoke to patient's power of attorney Laura [name]). She reports that patient resides at [facility].  According to POA, patient was found in her chair unresponsive and covered with emesis which had led to this admission.  Laura [name] endorses Dr. Renato Shin with [facility] as the primary care provider for patient.  She shared using [place] [facility] obtain medications without difficulty.  POA reports that patient managesherown medications using a system (routine for patient). Patient is taught, reminded and directed with any new medications per POA.  Most times facility provides transportation to her doctors'appointments, however, power of attorney brings her to her appointments if needed.   Patient [facility] at [facility]. Anticipated discharge plan is possibly [facility] - has worked hard to remain in an independent living environment .  If not, will need to work with Laura [name] to determine if able to move to assisted living (ALF) if that would be feasible per POA.  Patient's POA voiced understanding to call primary care provider's office when she returns back home, for a post discharge follow-up appointment within a week or sooner if needs arise.Patient letter (with PCP's contact number) was provided as theirreminder.  Patient's POA has voiced no other health management needs or concerns at this time.   For questions, please contact:  Dannielle Huh, BSN, RN- [facility] Primary Care Navigator   Telephone: [phone] [place]

## 2017-04-12 NOTE — Progress Notes (Signed)
No issues overnight. Feeding herself breakfast this am.   EXAM:  BP (!) 162/72   Pulse 73   Temp 98.1 F (36.7 C) (Oral)   Resp 20   Ht 5\' 3"  (1.6 m)   Wt 64.3 kg (141 lb 11.2 oz)   SpO2 100%   BMI 25.10 kg/m   Awake, alert,  CN grossly intact  Good strength BUE/BLE   IMPRESSION:  76 y.o. female with left temporal contusion, recovering well neurologically  PLAN: - Can f/u in neurosurgery clinic in 4 weeks - Dispo planning - SNF

## 2017-04-12 NOTE — NC FL2 (Addendum)
[place] MEDICAID FL2 LEVEL OF CARE SCREENING TOOL     IDENTIFICATION  Patient Name: Laura [name] Birthdate: Aug 12, 1941 Sex: female Admission Date (Current Location): 04/06/2017  [facility] and [place] Number:  Producer, television/film/video and Address:  [facility], [address], [place], [place] [address]      Provider Number: [id number]  Attending Physician Name and Address:  Osvaldo Shipper, MD  Relative Name and Phone Number:       Current Level of Care: Hospital Recommended Level of Care: Skilled Nursing Facility Prior Approval Number:    Date Approved/Denied:   PASRR Number:  [id number]  Discharge Plan: SNF    Current Diagnoses: Patient Active Problem List   Diagnosis Date Noted  . CVA (cerebral vascular accident) (HCC) 04/06/2017  . Bleeding, intracranial (HCC)   . Sepsis (HCC)   . Subdural hematoma (HCC)   . Trauma   . Osteoporosis 03/23/2017  . Menopause 03/25/2016  . Hyperglycemia 08/07/2015  . Contusion of left chest wall 02/05/2014  . Routine general medical examination at a health care facility 01/19/2014  . Left ventricular hypertrophy 07/15/2011  . Encounter for long-term (current) use of other medications 06/29/2011  . 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 TRIGGER FINGER 10/30/2009  . Hypothyroidism 04/15/2009  . ABNORMAL ELECTROCARDIOGRAM 04/15/2009  . RESTLESS LEG SYNDROME 10/01/2008  . HEADACHE 08/27/2008  . TACHYCARDIA 08/27/2008  . Essential hypertension 05/07/2008  . Abnormal involuntary movement 11/21/2007  . ANXIETY STATE, UNSPECIFIED 10/10/2007  . Dyslipidemia 06/22/2007    Orientation RESPIRATION BLADDER Height & Weight     Self (May fluctuate in orientation due to autism)  Normal Continent (baseline) Weight: 141 lb 11.2 oz (64.3 kg) Height:  5\' 3"  (160 cm)  BEHAVIORAL SYMPTOMS/MOOD NEUROLOGICAL BOWEL NUTRITION STATUS   (None)   Continent (baseline) Diet (Dysphagia 2)  AMBULATORY STATUS COMMUNICATION OF  NEEDS Skin   Extensive Assist Non-Verbally (Via Sign language) Other (Comment) (Scratch marks- arm, back. knee and legs)                       Personal Care Assistance Level of Assistance  Bathing, Dressing (Pt was independent of ADL's prior) Bathing Assistance: Limited assistance   Dressing Assistance: Limited assistance     Functional Limitations Info  Speech, Hearing (Deaf/mute)   Hearing Info: Impaired (Deaf) Speech Info: Impaired (mute)    SPECIAL CARE FACTORS FREQUENCY  PT (By licensed PT), OT (By licensed OT) (Requires interpreter)     PT Frequency: 5x/wk OT Frequency: 5x/wk            Contractures Contractures Info: Not present    Additional Factors Info  Code Status, Allergies Code Status Info: DNR Allergies Info: No Known allergies           Current Medications (04/12/2017):  This is the current hospital active medication list Current Facility-Administered Medications  Medication Dose Route Frequency Provider Last Rate Last Dose  . 0.9 %  sodium chloride infusion  250 mL Intravenous PRN Canary Brim L, NP 10 mL/hr at 04/11/17 1224 250 mL at 04/11/17 1224  . acetaminophen (TYLENOL) suppository 650 mg  650 mg Rectal Q6H PRN Canary Brim L, NP   650 mg at 04/10/17 0408  . amLODipine (NORVASC) tablet 5 mg  5 mg Oral Daily Osvaldo Shipper, MD   5 mg at 04/12/17 0902  . amoxicillin-clavulanate (AUGMENTIN) 875-125 MG per tablet 1 tablet  1 tablet Oral Q12H Osvaldo Shipper, MD  1 tablet at 04/12/17 1138  . hydrALAZINE (APRESOLINE) injection 10-40 mg  10-40 mg Intravenous Q4H PRN Ollis, Brandi L, NP   20 mg at 04/11/17 1740  . levETIRAcetam (KEPPRA) tablet 500 mg  500 mg Oral BID Osvaldo ShipperKrishnan, Gokul, MD      . levothyroxine (SYNTHROID, LEVOTHROID) tablet 50 mcg  50 mcg Oral QAC breakfast Osvaldo ShipperKrishnan, Gokul, MD   50 mcg at 04/12/17 0902  . MEDLINE mouth rinse  15 mL Mouth Rinse BID Nelda BucksFeinstein, Daniel J, MD   15 mL at 04/12/17 1000  . metoprolol tartrate (LOPRESSOR)  tablet 25 mg  25 mg Oral BID Osvaldo ShipperKrishnan, Gokul, MD   25 mg at 04/12/17 0902  . ondansetron (ZOFRAN) injection 4 mg  4 mg Intravenous Q8H PRN Jeanella Crazellis, Brandi L, NP         Discharge Medications: Please see discharge summary for a list of discharge medications.  Relevant Imaging Results:  Relevant Lab Results:   Additional Information SSN:  [id number]  Baldemar LenisElizabeth M Viviane Semidey, LCSW

## 2017-04-13 DIAGNOSIS — I629 Nontraumatic intracranial hemorrhage, unspecified: Secondary | ICD-10-CM | POA: Diagnosis not present

## 2017-04-13 DIAGNOSIS — E871 Hypo-osmolality and hyponatremia: Secondary | ICD-10-CM | POA: Diagnosis not present

## 2017-04-13 DIAGNOSIS — D500 Iron deficiency anemia secondary to blood loss (chronic): Secondary | ICD-10-CM | POA: Diagnosis not present

## 2017-04-13 DIAGNOSIS — I6200 Nontraumatic subdural hemorrhage, unspecified: Secondary | ICD-10-CM | POA: Diagnosis not present

## 2017-04-13 DIAGNOSIS — I10 Essential (primary) hypertension: Secondary | ICD-10-CM | POA: Diagnosis not present

## 2017-04-13 DIAGNOSIS — J690 Pneumonitis due to inhalation of food and vomit: Secondary | ICD-10-CM | POA: Diagnosis not present

## 2017-04-13 DIAGNOSIS — I69328 Other speech and language deficits following cerebral infarction: Secondary | ICD-10-CM | POA: Diagnosis not present

## 2017-04-13 DIAGNOSIS — I69391 Dysphagia following cerebral infarction: Secondary | ICD-10-CM | POA: Diagnosis not present

## 2017-04-13 DIAGNOSIS — R2689 Other abnormalities of gait and mobility: Secondary | ICD-10-CM | POA: Diagnosis not present

## 2017-04-13 DIAGNOSIS — M6281 Muscle weakness (generalized): Secondary | ICD-10-CM | POA: Diagnosis not present

## 2017-04-13 DIAGNOSIS — R1310 Dysphagia, unspecified: Secondary | ICD-10-CM | POA: Diagnosis not present

## 2017-04-13 DIAGNOSIS — R21 Rash and other nonspecific skin eruption: Secondary | ICD-10-CM | POA: Diagnosis not present

## 2017-04-13 DIAGNOSIS — E876 Hypokalemia: Secondary | ICD-10-CM | POA: Diagnosis not present

## 2017-04-13 DIAGNOSIS — D72829 Elevated white blood cell count, unspecified: Secondary | ICD-10-CM | POA: Diagnosis not present

## 2017-04-13 DIAGNOSIS — I639 Cerebral infarction, unspecified: Secondary | ICD-10-CM | POA: Diagnosis not present

## 2017-04-13 DIAGNOSIS — Z298 Encounter for other specified prophylactic measures: Secondary | ICD-10-CM | POA: Diagnosis not present

## 2017-04-13 DIAGNOSIS — Z5189 Encounter for other specified aftercare: Secondary | ICD-10-CM | POA: Diagnosis not present

## 2017-04-13 DIAGNOSIS — R5381 Other malaise: Secondary | ICD-10-CM | POA: Diagnosis not present

## 2017-04-13 DIAGNOSIS — R2681 Unsteadiness on feet: Secondary | ICD-10-CM | POA: Diagnosis not present

## 2017-04-13 LAB — BASIC METABOLIC PANEL
Anion gap: 9 (ref 5–15)
BUN: 8 mg/dL (ref 6–20)
CHLORIDE: 94 mmol/L — AB (ref 101–111)
CO2: 27 mmol/L (ref 22–32)
Calcium: 8.1 mg/dL — ABNORMAL LOW (ref 8.9–10.3)
Creatinine, Ser: 0.55 mg/dL (ref 0.44–1.00)
GFR calc Af Amer: >60 mL/min (ref >60)
GFR calc non Af Amer: >60 mL/min (ref >60)
GLUCOSE: 139 mg/dL — AB (ref 65–99)
Potassium: 3.3 mmol/L — ABNORMAL LOW (ref 3.5–5.1)
Sodium: 130 mmol/L — ABNORMAL LOW (ref 135–145)

## 2017-04-13 LAB — CBC
HEMATOCRIT: 35 % — AB (ref 36.0–46.0)
Hemoglobin: 11.7 g/dL — ABNORMAL LOW (ref 12.0–15.0)
MCH: 31.2 pg (ref 26.0–34.0)
MCHC: 33.4 g/dL (ref 30.0–36.0)
MCV: 93.3 fL (ref 78.0–100.0)
Platelets: 343 10*3/uL (ref 150–400)
RBC: 3.75 MIL/uL — ABNORMAL LOW (ref 3.87–5.11)
RDW: 11.9 % (ref 11.5–15.5)
WBC: 13.2 10*3/uL — ABNORMAL HIGH (ref 4.0–10.5)

## 2017-04-13 MED ORDER — LEVETIRACETAM 500 MG PO TABS: 500.0000 mg | ORAL_TABLET | Freq: Two times a day (BID) | ORAL | Status: DC

## 2017-04-13 MED ORDER — POTASSIUM CHLORIDE CRYS ER 20 MEQ PO TBCR
40.0000 meq | EXTENDED_RELEASE_TABLET | Freq: Once | ORAL | Status: AC
  Administered 2017-04-13: 40 meq via ORAL
  Filled 2017-04-13: qty 2

## 2017-04-13 MED ORDER — ACETAMINOPHEN 325 MG PO TABS
650.0000 mg | ORAL_TABLET | Freq: Four times a day (QID) | ORAL | Status: DC | PRN
  Administered 2017-04-13: 650 mg via ORAL
  Filled 2017-04-13: qty 2

## 2017-04-13 MED ORDER — CLONAZEPAM 0.5 MG PO TABS: 0.5000 mg | ORAL_TABLET | Freq: Two times a day (BID) | ORAL | 0 refills | Status: DC | PRN

## 2017-04-13 MED ORDER — AMLODIPINE BESYLATE 10 MG PO TABS: 10.0000 mg | ORAL_TABLET | Freq: Every day | ORAL | Status: DC

## 2017-04-13 MED ORDER — METOPROLOL TARTRATE 25 MG PO TABS: 25.0000 mg | ORAL_TABLET | Freq: Two times a day (BID) | ORAL | 0 refills | Status: DC

## 2017-04-13 MED ORDER — AMOXICILLIN-POT CLAVULANATE 875-125 MG PO TABS: 1.0000 | ORAL_TABLET | Freq: Two times a day (BID) | ORAL | 0 refills | Status: AC

## 2017-04-13 MED ORDER — AMLODIPINE BESYLATE 10 MG PO TABS
10.0000 mg | ORAL_TABLET | Freq: Every day | ORAL | Status: DC
  Administered 2017-04-13: 10 mg via ORAL

## 2017-04-13 NOTE — Discharge Summary (Addendum)
[facility]  Physician Discharge Summary   Patient ID: Laura [name] MRN: [id number] DOB/AGE: 08/01/1941 76 y.o.  Admit date: 04/06/2017 Discharge date: 04/13/2017  PCP: Romero Belling, MD  DISCHARGE DIAGNOSES:  Active Problems:   CVA (cerebral vascular accident) (HCC)   RECOMMENDATIONS FOR OUTPATIENT FOLLOW UP: 1. Patient is deaf. She uses sign language to communicate. 2. Speech therapy to continue to follow 3. Monitor blood pressures closely. She may need further adjustment to her medications. 4. CBC and basic metabolic panel in 1 week   DISCHARGE CONDITION: fair  Diet recommendation: Dysphagia 2 diet with liquids  Filed Weights   04/11/17 0457 04/11/17 2140 04/13/17 0426  Weight: 61 kg (134 lb 8 oz) 64.3 kg (141 lb 11.2 oz) 61.3 kg (135 lb 1.6 oz)    INITIAL HISTORY: 76 year old female who apparently lives at a assisted living facility who has a past medical history of deafness, autism, was brought in from her facility for unresponsiveness. Apparently she has been having nausea and vomiting. She was found to have a hematoma over her right occipital scalp area. Evaluation revealed a left temporal lobe intracranial hematoma along with a left-sided subdural hematoma. She had a fever when she was initially evaluated. She was placed on empiric antibiotics. Neurosurgery and neurology was consulted. She was admitted to the intensive care unit.  Consultations:  Neurosurgery: Dr. Conchita Paris  Neurology   [facility] COURSE:   Intracranial hemorrhage/subdural hematoma/acute encephalopathy This could have been due to either trauma or hypertension. Patient was seen by neurosurgery and neurology. She did not require surgical intervention. She was started on Keppra for seizure prophylaxis. At baseline patient lives in an assisted living facility. She has autism and her intellectual level is that of a 76 year old. She was seen by physical and occupational therapy as she improved.  She is thought to require short-term rehabilitation at a skilled nursing facility. She needs sign language interpretation. Plan is for follow-up with neurosurgery in 4 weeks. At that point in time seizure prophylaxis may be discontinued.  Aspiration pneumonia Patient aspirated and developed pneumonia. She was started on Unasyn. Changed over to Augmentin, which will be continued for a few more days. Seen by speech therapy. She underwent swallow evaluation. Currently on dysphagia 2 diet with thin liquids. She would benefit from continued SLP evaluation at the skilled nursing facility.   Essential hypertension Blood pressures were significantly elevated. Patient was started on metoprolol. This will be continued. Amlodipine has been added. And blood pressure will need to be monitored closely. Medication dose adjustments may need to be made.   History of hypothyroidism. Continue levothyroxine.   Vomiting This occurred at the assisted living facility. None noted here so far. Episode of vomiting could've been due to her intracranial hemorrhage.  History of autism and deafness. Stable  Overall, stable. Patient has improved.  Okay for discharge today.   PERTINENT LABS:  The results of significant diagnostics from this hospitalization (including imaging, microbiology, ancillary and laboratory) are listed below for reference.    Microbiology: Recent Results (from the past 240 hour(s))  Culture, blood (Routine X 2) w Reflex to ID Panel     Status: None   Collection Time: 04/06/17  2:15 PM  Result Value Ref Range Status   Specimen Description BLOOD LEFT ANTECUBITAL  Final   Special Requests   Final    BOTTLES DRAWN AEROBIC AND ANAEROBIC Blood Culture adequate volume   Culture NO GROWTH 5 DAYS  Final   Report Status 04/11/2017 FINAL  Final  Culture, blood (Routine X 2) w Reflex to ID Panel     Status: None   Collection Time: 04/06/17  6:38 PM  Result Value Ref Range Status   Specimen  Description BLOOD RIGHT HAND  Final   Special Requests   Final    BOTTLES DRAWN AEROBIC ONLY Blood Culture adequate volume   Culture NO GROWTH 5 DAYS  Final   Report Status 04/11/2017 FINAL  Final  MRSA PCR Screening     Status: None   Collection Time: 04/06/17 10:37 PM  Result Value Ref Range Status   MRSA by PCR NEGATIVE NEGATIVE Final    Comment:        The GeneXpert MRSA Assay (FDA approved for NASAL specimens only), is one component of a comprehensive MRSA colonization surveillance program. It is not intended to diagnose MRSA infection nor to guide or monitor treatment for MRSA infections.      Labs: Basic Metabolic Panel:  Recent Labs Lab 04/06/17 1819 04/07/17 0316 04/08/17 0222 04/09/17 0340 04/10/17 0319 04/11/17 0322 04/13/17 0351  NA  --  133* 134* 140 136 132* 130*  K  --  4.0 3.9 4.0 3.9 3.9 3.3*  CL  --  102 103 109 107 101 94*  CO2  --  25 23 21* 22 22 27   GLUCOSE  --  135* 167* 128* 110* 123* 139*  BUN  --  10 14 22* 20 10 8   CREATININE  --  0.61 0.61 0.60 0.65 0.54 0.55  CALCIUM  --  7.5* 7.8* 7.8* 7.5* 8.0* 8.1*  MG 1.7 1.7 1.8 2.2  --   --   --   PHOS 2.7  --   --  1.7*  --   --   --    Liver Function Tests:  Recent Labs Lab 04/06/17 1213  AST 30  ALT 19  ALKPHOS 69  BILITOT 0.7  PROT 7.1  ALBUMIN 3.8   CBC:  Recent Labs Lab 04/06/17 1213  04/08/17 0222 04/09/17 0340 04/10/17 0319 04/11/17 0322 04/13/17 0351  WBC 13.5*  < > 14.7* 14.3* 11.3* 10.7* 13.2*  NEUTROABS 11.5*  --   --   --   --   --   --   HGB 12.5  < > 11.2* 11.1* 10.1* 11.4* 11.7*  HCT 38.4  < > 35.2* 35.4* 31.7* 35.0* 35.0*  MCV 94.3  < > 97.0 97.3 95.8 94.9 93.3  PLT 235  < > 205 232 246 258 343  < > = values in this interval not displayed. Cardiac Enzymes:  Recent Labs Lab 04/06/17 1819  CKTOTAL 85   BNP: BNP (last 3 results)  Recent Labs  04/06/17 1213  BNP 135.6*    IMAGING STUDIES Ct Head Wo Contrast  Result Date: 04/07/2017 CLINICAL  DATA:  Subdural hematoma follow-up EXAM: CT HEAD WITHOUT CONTRAST TECHNIQUE: Contiguous axial images were obtained from the base of the skull through the vertex without intravenous contrast. COMPARISON:  Head CT 04/06/2017 FINDINGS: Brain: Intraparenchymal hematoma in the left temporal lobe measures 4.3 x 2.2 x 4.4 cm (volume = 22 cm^3), previously 4.1 x 2.0 x 4.4 cm (volume = 19 cm^3). Left convexity subdural hematoma is unchanged in thickness measuring 5-6 mm. Hematoma extends along the left leaflet of the cerebellar tentorium. There is mass effect on the lateral ventricles, but no hydrocephalus or ventricular trapping. There is a rightward midline shift measuring 7 mm, previously 6 mm. No new area of hemorrhage. Basal cisterns  remain patent. No cerebellar tonsillar herniation. Vascular: No hyperdense vessel or unexpected calcification. Skull: Normal visualized skull base, calvarium and extracranial soft tissues. Sinuses/Orbits: No sinus fluid levels or advanced mucosal thickening. No mastoid effusion. Normal orbits. IMPRESSION: 1. Slightly increased size of left temporal lobe intraparenchymal hematoma with very slight increase in rightward midline shift common of 7 mm. No new area of hemorrhage. 2. Unchanged size of left convexity subdural hematoma. 3. No hydrocephalus. Electronically Signed   By: Deatra Robinson M.D.   On: 04/07/2017 05:52   Ct Head Wo Contrast  Result Date: 04/06/2017 CLINICAL DATA:  Found covered and vomit at care facility, last seen normal at 1700 hours. RIGHT occipital hematoma. Recently admitted for sepsis. EXAM: CT HEAD WITHOUT CONTRAST TECHNIQUE: Contiguous axial images were obtained from the base of the skull through the vertex without intravenous contrast. COMPARISON:  CT HEAD November 12, 2008 and MRI of the head September 14, 2011 FINDINGS: BRAIN: 2 x 4.1 cm LEFT temporal lobe intraparenchymal hematoma with subcentimeter satellite hematomas. Low-density surrounding vasogenic edema. In  addition, 7 mm dense LEFT holo hemispheric subdural hematoma. Acute bilateral cerebellar tentorial subdural hematomas measuring to 3 mm extending to the RIGHT interhemispheric fissure. 7 mm LEFT-to-RIGHT midline shift. Mild mass effect LEFT lateral ventricle without entrapment or hydrocephalus. Basal cisterns are patent. VASCULAR: Mild calcific atherosclerosis of the carotid siphons. SKULL: No skull fracture. Small RIGHT parietal scalp hematoma without subcutaneous gas or radiopaque foreign bodies. SINUSES/ORBITS: Status post RIGHT status with RIGHT sphenoid sinus air-fluid level. Mild paranasal sinus mucosal thickening. Under pneumatized, well-aerated mastoid aircells. The included ocular globes and orbital contents are non-suspicious. Status post bilateral ocular lens implants. OTHER: None. IMPRESSION: 1. Acute 2 x 4.1 cm LEFT temporal lobe hematoma with surrounding subcentimeter intraparenchymal hemorrhages. 2. Acute LEFT holo hemispheric subdural hematoma with acute falcotentorial subdural hematoma measuring to 3 mm. 3. 7 mm LEFT-to-RIGHT midline shift without ventricle entrapment. 4. Small RIGHT parietal scalp hematoma, no skull fracture. Critical Value/emergent results were called by telephone at the time of interpretation on 04/06/2017 at 1:55 pm to Dr. Loren Racer , who verbally acknowledged these results. Electronically Signed   By: Awilda Metro M.D.   On: 04/06/2017 14:06   Ct Cervical Spine Wo Contrast  Result Date: 04/06/2017 CLINICAL DATA:  Fall EXAM: CT CERVICAL SPINE WITHOUT CONTRAST TECHNIQUE: Multidetector CT imaging of the cervical spine was performed without intravenous contrast. Multiplanar CT image reconstructions were also generated. COMPARISON:  04/06/2017 FINDINGS: Alignment: Trace retrolisthesis of C4 on C5. Straightening of the cervical spine. Skull base and vertebrae: Craniovertebral junction is intact. There is no fracture identified. Mildly hypoplastic appearing C5 and C6  vertebral bodies with fusion. Soft tissues and spinal canal: No prevertebral fluid or swelling. No visible canal hematoma. Disc levels: Marked disc space narrowing, endplate change and osteophyte at C4-C5 with posterior disc osteophyte complex and mild canal stenosis. Moderate to marked bilateral foraminal narrowing at this level. Moderate narrowing at C6-C7 with moderate foraminal narrowing. Upper chest: Negative. Other: None IMPRESSION: 1. No definite acute osseous abnormality 2. 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 Moderate severe degenerative disc changes at C4-C5 and C6-C7. Electronically Signed   By: Jasmine Pang M.D.   On: 04/06/2017 17:43   Dg Chest Port 1 View  Result Date: 04/07/2017 CLINICAL DATA:  Followup respiratory failure. EXAM: PORTABLE CHEST 1 VIEW COMPARISON:  04/06/2017 FINDINGS: Chronic cardiomegaly with left ventricular prominence. Chronic aortic atherosclerosis. Patchy density in both lower lobes  consistent with mild atelectasis/ pneumonia. Upper lungs are clear. No evidence heart failure or effusion. No acute bone finding. IMPRESSION: Cardiomegaly. Persistent patchy lower lobe density consistent with atelectasis or mild pneumonia. Electronically Signed   By: Paulina Fusi M.D.   On: 04/07/2017 07:25   Dg Chest Port 1 View  Result Date: 04/06/2017 CLINICAL DATA:  Sepsis, emesis and unresponsive. EXAM: PORTABLE CHEST 1 VIEW COMPARISON:  02/05/2014 CXR FINDINGS: Cardiomegaly with aortic atherosclerosis. Right basilar atelectasis and/or scarring. No pneumonic consolidation, CHF nor effusion. No pneumothorax. No acute nor suspicious osseous abnormality. IMPRESSION: Cardiomegaly with aortic atherosclerosis. Right basilar atelectasis and/or scarring. Otherwise negative exam. Electronically Signed   By: Tollie Eth M.D.   On: 04/06/2017 22:17    DISCHARGE EXAMINATION: Vitals:   04/13/17 0100 04/13/17 0426 04/13/17 0500 04/13/17 0938  BP: (!)  147/88  (!) 156/73 (!) 163/87  Pulse: 76  68 94  Resp: 16  16 20   Temp: 98.5 F (36.9 C)  98.4 F (36.9 C) 98 F (36.7 C)  TempSrc: Oral  Oral Oral  SpO2: 100%  96% 98%  Weight:  61.3 kg (135 lb 1.6 oz)    Height:       General appearance: alert, cooperative, distracted and no distress Resp: Improved air entry bilaterally Cardio: regular rate and rhythm, S1, S2 normal, no murmur, click, rub or gallop GI: soft, non-tender; bowel sounds normal; no masses,  no organomegaly Extremities: extremities normal, atraumatic, no cyanosis or edema Neurologic: Awake, alert. Uses sign language to communicate.  DISPOSITION: SNF  Discharge Instructions    Call MD for:  difficulty breathing, headache or visual disturbances    Complete by:  As directed    Call MD for:  extreme fatigue    Complete by:  As directed    Call MD for:  persistant dizziness or light-headedness    Complete by:  As directed    Call MD for:  persistant nausea and vomiting    Complete by:  As directed    Call MD for:  severe uncontrolled pain    Complete by:  As directed    Call MD for:  temperature >100.4    Complete by:  As directed    Discharge instructions    Complete by:  As directed    See discharge summary for recommendations.  You were cared for by a hospitalist during your hospital stay. If you have any questions about your discharge medications or the care you received while you were in the hospital after you are discharged, you can call the unit and asked to speak with the hospitalist on call if the hospitalist that took care of you is not available. Once you are discharged, your primary care physician will handle any further medical issues. Please note that NO REFILLS for any discharge medications will be authorized once you are discharged, as it is imperative that you return to your primary care physician (or establish a relationship with a primary care physician if you do not have one) for your aftercare needs  so that they can reassess your need for medications and monitor your lab values. If you do not have a primary care physician, you can call [phone] for a physician referral.   Increase activity slowly    Complete by:  As directed       ALLERGIES: No Known Allergies   Current Discharge Medication List    START taking these medications   Details  amLODipine (NORVASC) 10 MG tablet Take  1 tablet (10 mg total) by mouth daily.    amoxicillin-clavulanate (AUGMENTIN) 875-125 MG tablet Take 1 tablet by mouth every 12 (twelve) hours. For 5 more days Qty: 10 tablet, Refills: 0    levETIRAcetam (KEPPRA) 500 MG tablet Take 1 tablet (500 mg total) by mouth 2 (two) times daily.      CONTINUE these medications which have CHANGED   Details  clonazePAM (KLONOPIN) 0.5 MG tablet Take 1 tablet (0.5 mg total) by mouth 2 (two) times daily as needed for anxiety. Qty: 15 tablet, Refills: 0    metoprolol tartrate (LOPRESSOR) 25 MG tablet Take 1 tablet (25 mg total) by mouth 2 (two) times daily. Qty: 30 tablet, Refills: 0      CONTINUE these medications which have NOT CHANGED   Details  alendronate (FOSAMAX) 70 MG tablet Take 1 tablet (70 mg total) by mouth once a week. Take with a full glass of water on an empty stomach. Qty: 4 tablet, Refills: 11    cholecalciferol (VITAMIN D) 1000 UNITS tablet Take 1,000 Units by mouth daily.   Associated Diagnoses: Hypothyroidism, unspecified hypothyroidism type; Dyslipidemia; Essential hypertension; Hyperglycemia    levothyroxine (SYNTHROID, LEVOTHROID) 50 MCG tablet Take 1 tablet (50 mcg total) by mouth daily. Qty: 90 tablet, Refills: 1    Multiple Vitamin (MULTIVITAMIN) tablet Take 1 tablet by mouth daily.      triamcinolone cream (KENALOG) 0.1 % APPLY  CREAM EXTERNALLY THREE TIMES DAILY AS NEEDED FOR ITCHING Qty: 80 g, Refills: 2    vitamin E 100 UNIT capsule Take 100 Units by mouth 2 (two) times daily.           Follow-up Information    Lisbeth RenshawNundkumar,  Neelesh, MD Follow up in 4 week(s).   Specialty:  Neurosurgery Contact information: [address] [place] [address] [phone]        Romero BellingEllison, Sean, MD. Schedule an appointment as soon as possible for a visit in 4 week(s).   Specialty:  Endocrinology Contact information: [address] [place] [address] 602 [phone]           TOTAL DISCHARGE TIME: 35 mins  [facility]KRISHNAN,Jene Huq  [facility] Pager [phone]  04/13/2017, 11:05 AM

## 2017-04-13 NOTE — Progress Notes (Signed)
Patient is discharged from room 5M15 at this time. Alert and in stable condition. IV site d/c'd as well as tele. Instructions read to patient , [facility] with interpreter and all questions answered. [facility] with [facility] success. Patient left unit via wheelchair with all belongings at side.

## 2017-04-13 NOTE — Progress Notes (Signed)
CSW met with HCPOA to discuss bed offers and coordinate discharge to SNF. HCPOA reminded CSW of the importance of facility providing interpreter services for pt. [facility]CPOA discussed options and requested going to visit [place]. [facility]OA also wanted to discuss ALF options for the future and how that process would work. CSW provided information and answered questions. [facility] to contact CSW back after visit with [facility] to confirm.  CSW continuing to follow to facilitate discharge needs.  Laveda Abbe LCSW [phone]

## 2017-04-13 NOTE — Discharge Instructions (Signed)
Intracranial Hemorrhage °An intracranial hemorrhage is bleeding in the layers between the skull (cranium) and brain. A blood vessel bursts and allows blood to leak inside the cranial cavity. The leaking blood then collects (hematoma). This causes pressure and damage to brain cells. The bleeding can be mild to severe. In severe cases, it can lead to permanent damage or death. Symptoms may come on suddenly or develop over time. Early diagnosis and treatment leads to better recovery. °There are four types of intracranial hemorrhage: subarachnoid, subdural, extradural, or cerebral hemorrhage. °What are the causes? °· Head injury (trauma). °· Ruptured brain aneurysm. °· Bleeding from blood vessels that develop abnormally (arteriovenous malformation). °· Bleeding disorder. °· Use of blood thinners (anticoagulants). °· Use of certain drugs, such as cocaine. °For some people with intracranial hemorrhage, the cause is unknown. °What increases the risk? °· Using tobacco products, such as cigarettes and chewing tobacco. °· Having high blood pressure (hypertension). °· Abusing alcohol. °· Being a female, especially of postmenopausal age. °· Having a family history of disease in the blood vessels of the brain (cerebrovascular disease). °· Having certain genetic syndromes that result in kidney disease or connective tissue disease. °What are the signs or symptoms? °· A sudden, severe headache with no known cause. The headache is often described as the worst headache ever experienced. °· Nausea or vomiting, especially when combined with other symptoms such as a headache. °· Sudden weakness or numbness of the face, arm, or leg, especially on one side of the body. °· Sudden trouble walking or difficulty moving arms or legs. °· Sudden confusion. °· Sudden personality changes. °· Trouble speaking (aphasia) or understanding. °· Difficulty swallowing. °· Sudden trouble seeing in one or both eyes. °· Double vision. °· Dizziness. °· Loss  of balance or coordination. °· Intolerance to light. °· Stiff neck. °How is this diagnosed? °Your health care provider will perform a physical exam and ask about your symptoms. If an intracranial hemorrhage is suspected, various tests may be ordered. These tests may include: °· A CT scan. °· An MRI. °· A cerebral angiogram. °· A spinal tap (lumbar puncture). °· Blood tests. °How is this treated? °Immediate treatment in the hospital is often required to reduce the risk of brain damage. Treatment will depend on the cause of the bleeding, where it is located, and the extent of the bleeding and damage. The goals of treatment include stopping the bleeding, repairing the cause of bleeding, providing relief of symptoms, and preventing problems. °· Medicines may be given to: °¨ Lower blood pressure (antihypertensives). °¨ Relieve pain (analgesics). °¨ Relieve nausea or vomiting. °· Surgery may be needed to stop the bleeding, repair the cause of the bleeding, or remove the blood. °· Rehabilitation may be needed to improve any cognitive and day-to-day functions impaired by the condition. °Further treatment depends on the duration, severity, and cause of your symptoms. Physical, speech, and occupational therapists will assess you and work to improve any functions impaired by the intracranial hemorrhage. Measures will be taken to prevent short-term and long-term problems, including infection from breathing foreign material into the lungs (aspiration pneumonia), blood clots in the legs, bedsores, and falls. °Follow these instructions at home: °· Take medicines only as directed by your health care provider. °· Eat healthy foods as directed by your health care provider: °¨ A diet low in salt (sodium), saturated fat, trans fat, and cholesterol may be recommended to manage your blood pressure. °¨ Foods may need to be soft or pureed, or small   bites may need to be taken in order to avoid aspirating or choking. °¨ If studies show that  your ability to swallow safely has been affected, you may need to seek help from specialists such as a dietitian, speech and language pathologist, or an occupational therapist. These health care providers can teach you how to safely get the nutrition your body needs. °· Rest and limit activities or movements as directed by your health care provider. °· Do not use any tobacco products including cigarettes, chewing tobacco, or electronic cigarettes. If you need help quitting, ask your health care provider. °· Limit alcohol intake to no more than 1 drink per day for nonpregnant women and 2 drinks per day for men. One drink equals 12 ounces of beer, 5 ounces of wine, or 1½ ounces of hard liquor. °· Make any other lifestyle changes as directed by your health care provider. °· Monitor and record your blood pressure as directed by your health care provider. °· A safe home environment is important to reduce the risk of falls. Your health care provider may arrange for specialists to evaluate your home. Having grab bars in the bedroom and bathroom is often important. Your health care provider may arrange for special equipment to be used at home, such as raised toilets and a seat for the shower. °· Do physical, occupational, and speech therapy as directed by your health care provider. Ongoing therapy may be needed to maximize your recovery. °· Use a walker or a cane at all times if directed by your health care provider. °· Keep all follow-up visits with your health care provider and other specialists. This is important. This includes any referrals, physical therapy, and rehabilitation. °Get help right away if: °· You have a sudden, severe headache with no known cause. °· You have nausea or vomiting occurring with another symptom. °· You have sudden weakness or numbness of the face, arm, or leg, especially on one side of the body. °· You have sudden trouble walking or difficulty moving your arms or legs. °· You have sudden  confusion. °· You have trouble speaking (aphasia) or understanding. °· You have sudden trouble seeing in one or both eyes. °· You have a sudden loss of balance or coordination. °· You have a stiff neck. °· You have difficulty breathing. °· You have a partial or total loss of consciousness. °These symptoms may represent a serious problem that is an emergency. Do not wait to see if the symptoms will go away. Get medical help right away. Call your local emergency services (911 in the [place] Do not drive yourself to the hospital. °This information is not intended to replace advice given to you by your health care provider. Make sure you discuss any questions you have with your health care provider. °Document Released: 06/06/2014 Document Revised: 04/10/2016 Document Reviewed: 01/03/2014 °Elsevier Interactive Patient Education © 2017 [employer] ° °

## 2017-04-13 NOTE — Progress Notes (Signed)
Occupational Therapy Treatment Patient Details Name: Laura [name] MRN: [id number] DOB: 1941/01/03 Today's Date: 04/13/2017    History of present illness Pt is a 76 yo female admitted through ED on 04/06/17 unresponsive. Pt was living in an [place] at [facility]. Pt was diagnosied with an ICH and subdural hematoma with acute encephalopathy possible due to a traumatic even. Pt has history of autism, anxiety, OA, HLD, hypothyroid.    OT comments  Pt progressing well toward OT goals. She demonstrated improved selective attention and ability to follow one-step commands this session. Pt able to complete standing grooming tasks with min guard assist at sink and complete ambulating toilet transfer with min assist. Pt met 1/3 goals and updated plan of care accordingly this session. Pt demonstrates significant progress with ADL participation. At current functional level feel pt most appropriate for SNF level rehabilitation and updated plan of care accordingly. OT will continue to follow acutely.    Follow Up Recommendations  Supervision/Assistance - 24 hour;SNF    Equipment Recommendations  Other (comment) (TBD at next venue of care)    Recommendations for Other Services Rehab consult    Precautions / Restrictions Precautions Precautions: Fall Restrictions Weight Bearing Restrictions: No       Mobility Bed Mobility               General bed mobility comments: OOB in chair on arrival.   Transfers Overall transfer level: Needs assistance Equipment used: Rolling walker (2 wheeled) Transfers: Sit to/from Stand Sit to Stand: Min assist              Balance Overall balance assessment: Needs assistance Sitting-balance support: Feet unsupported;No upper extremity supported Sitting balance-Leahy Scale: Good     Standing balance support: Bilateral upper extremity supported;No upper extremity supported;During functional activity Standing balance-Leahy Scale: Fair Standing  balance comment: Able to statically stand without UE support for ADL but B UE support required for dynamic tasks.                            ADL either performed or assessed with clinical judgement   ADL Overall ADL's : Needs assistance/impaired     Grooming: Oral care;Wash/dry face;Standing;Min guard Grooming Details (indicate cue type and reason): Able to stand at sink for grooming tasks with min guard assist.              Lower Body Dressing: Minimal assistance;Sit to/from stand   Toilet Transfer: Minimal assistance;Ambulation;BSC;RW Toilet Transfer Details (indicate cue type and reason): Able to ambulate into bathroom with min assist and RW this session.  Toileting- Clothing Manipulation and Hygiene: Minimal assistance;Sitting/lateral lean       Functional mobility during ADLs: Minimal assistance;Rolling walker General ADL Comments: Able to complete ambulation in room with RW in preparation for ADL this session.      Vision   Additional Comments: Wearing glasses and kept eyes open throughout session.    Perception     Praxis      Cognition Arousal/Alertness: Awake/alert Behavior During Therapy: WFL for tasks assessed/performed Overall Cognitive Status: Impaired/Different from baseline Area of Impairment: Attention;Following commands;Awareness                   Current Attention Level: Selective   Following Commands: Follows one step commands consistently   Awareness: Intellectual   General Comments: Pt following one-step commands consistently this session and very communicative with therapist and family.  Exercises     Shoulder Instructions       General Comments      Pertinent Vitals/ Pain       Pain Assessment: Faces Faces Pain Scale: No hurt  Home Living                                          Prior Functioning/Environment              Frequency  Min 3X/week        Progress Toward  Goals  OT Goals(current goals can now be found in the care plan section)  Progress towards OT goals: Progressing toward goals;Goals met and updated - see care plan  Acute Rehab OT Goals Patient Stated Goal: none stated OT Goal Formulation: Patient unable to participate in goal setting Time For Goal Achievement: 04/24/17 Potential to Achieve Goals: Good ADL Goals Pt Will Perform Grooming: with supervision;standing (3 tasks) Pt Will Transfer to Toilet: with supervision;ambulating;regular height toilet Additional ADL Goal #1: Pt will demonstrate selective attention during morning ADL routine in a minimally distracting environment.   Plan Discharge plan remains appropriate    Co-evaluation                 AM-PAC PT "6 Clicks" Daily Activity     Outcome Measure   Help from another person eating meals?: A Little Help from another person taking care of personal grooming?: A Little Help from another person toileting, which includes using toliet, bedpan, or urinal?: A Little Help from another person bathing (including washing, rinsing, drying)?: A Lot Help from another person to put on and taking off regular upper body clothing?: A Little Help from another person to put on and taking off regular lower body clothing?: A Lot 6 Click Score: 16    End of Session Equipment Utilized During Treatment: Gait belt;Rolling walker  OT Visit Diagnosis: Muscle weakness (generalized) (M62.81);Cognitive communication deficit (R41.841)   Activity Tolerance Patient tolerated treatment well   Patient Left in chair;with call bell/phone within reach;with family/visitor present   Nurse Communication Mobility status        Time: 0930-1010 OT Time Calculation (min): 40 min  Charges: OT General Charges $OT Visit: 1 Procedure OT Treatments $Self Care/Home Management : 38-52 mins  Norman Herrlich, MS OTR/L  Pager: Laura [name] 04/13/2017, 1:31 PM

## 2017-04-13 NOTE — Progress Notes (Signed)
Discharge to: [facility] Anticipated discharge date: 04/13/17 Family notified: HCPOA, by phone Transportation by: Car  Report #: [phone]  CSW signing off.  Blenda Nicelylizabeth Chasitie Passey LCSW [phone]

## 2017-04-14 ENCOUNTER — Non-Acute Institutional Stay (SKILLED_NURSING_FACILITY): Payer: Medicare Other | Admitting: Internal Medicine

## 2017-04-14 ENCOUNTER — Encounter: Payer: Self-pay | Admitting: Internal Medicine

## 2017-04-14 DIAGNOSIS — E039 Hypothyroidism, unspecified: Secondary | ICD-10-CM

## 2017-04-14 DIAGNOSIS — Z2989 Encounter for other specified prophylactic measures: Secondary | ICD-10-CM

## 2017-04-14 DIAGNOSIS — I10 Essential (primary) hypertension: Secondary | ICD-10-CM

## 2017-04-14 DIAGNOSIS — Z298 Encounter for other specified prophylactic measures: Secondary | ICD-10-CM

## 2017-04-14 DIAGNOSIS — E871 Hypo-osmolality and hyponatremia: Secondary | ICD-10-CM | POA: Diagnosis not present

## 2017-04-14 DIAGNOSIS — R1310 Dysphagia, unspecified: Secondary | ICD-10-CM

## 2017-04-14 DIAGNOSIS — J690 Pneumonitis due to inhalation of food and vomit: Secondary | ICD-10-CM

## 2017-04-14 DIAGNOSIS — F424 Excoriation (skin-picking) disorder: Secondary | ICD-10-CM

## 2017-04-14 DIAGNOSIS — R2681 Unsteadiness on feet: Secondary | ICD-10-CM

## 2017-04-14 DIAGNOSIS — R5381 Other malaise: Secondary | ICD-10-CM

## 2017-04-14 DIAGNOSIS — H9193 Unspecified hearing loss, bilateral: Secondary | ICD-10-CM

## 2017-04-14 DIAGNOSIS — E876 Hypokalemia: Secondary | ICD-10-CM

## 2017-04-14 DIAGNOSIS — R21 Rash and other nonspecific skin eruption: Secondary | ICD-10-CM

## 2017-04-14 DIAGNOSIS — D72829 Elevated white blood cell count, unspecified: Secondary | ICD-10-CM | POA: Diagnosis not present

## 2017-04-14 DIAGNOSIS — I629 Nontraumatic intracranial hemorrhage, unspecified: Secondary | ICD-10-CM

## 2017-04-14 DIAGNOSIS — M810 Age-related osteoporosis without current pathological fracture: Secondary | ICD-10-CM

## 2017-04-14 DIAGNOSIS — D500 Iron deficiency anemia secondary to blood loss (chronic): Secondary | ICD-10-CM | POA: Diagnosis not present

## 2017-04-14 DIAGNOSIS — F840 Autistic disorder: Secondary | ICD-10-CM

## 2017-04-14 NOTE — Progress Notes (Signed)
[facility]ATION[facility]  PCP: Romero BellingEllison, Sean, MD   Code Status: DNR  Goals of care: Advanced Directive information Advanced Directives 04/14/2017  Does Patient Have a Medical Advance Directive? Yes  Type of Advance Directive Out of facility DNR (pink MOST or yellow form)  Does patient want to make changes to medical advance directive? No - Patient declined  Would patient like information on creating a medical advance directive? -       Extended Emergency Contact Information Primary Emergency Contact: Bonita Quinavis,[name]          [place] Work Phone: [phone] Relation: Other Secondary Emergency Contact: Maustaby,Charles  [place] Mobile Phone: [phone] Relation: Relative   No Known Allergies  Chief Complaint  Patient presents with  . New Admit To SNF    New Admission Visit      HPI:  Patient is a 76 y.o. female seen today for short term rehabilitation post hospital admission from 04/06/17-04/13/17 with intracranial hemorrhage thought to be from trauma vs hypertension. She had left temporal lobe intracranial hematoma along with left subdural hematoma noted on imaging. She was seen by neurosurgery. She was placed on keppra for seizure prophylaxis. Conservative management was recommended. She had fever and was treated for aspiration pneumonia with antibiotics. She was started on antihypertensive in the hospital. She was seen by PT, OT and SLP in hospital. She has medical history of hypothyroidism, congenital deafness and autism. She is seen in her room today with her medical power of attorney, caregiver and sign interpreter. She lived in ALF prior to this hospitalization.   Review of Systems:  Constitutional: Negative for fever, chills HENT: Negative for headache, congestion, nasal discharge, difficulty swallowing.   Eyes: Negative for eye pain, blurred vision, double vision and discharge.  Respiratory: Negative for cough,  shortness of breath  Cardiovascular: Negative for chest pain, palpitation Gastrointestinal: Negative for heartburn, nausea, vomiting, abdominal pain. She had a bowel movement yesterday.  Genitourinary: Negative for dysuria.  Musculoskeletal: Negative for pain, fall.  Skin: Negative for itching, rash. she has a tendency to pick her skin per HCPOA.  Neurological: Negative for dizziness. Psychiatric/Behavioral: Negative for depression   Past Medical History:  Diagnosis Date  . Anxiety state, unspecified 10/10/2007  . Arthritis   . ASYMPTOMATIC POSTMENOPAUSAL STATUS 04/15/2009  . Cataract    removed both eyes   . Deaf   . Dermatitis   . Headache(784.0) 08/27/2008  . HYPERLIPIDEMIA 06/22/2007   rx refused  . HYPERTENSION 05/07/2008  . HYPOTHYROIDISM 04/15/2009  . Mild mental retardation   . 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 RESTLESS LEG SYNDROME 10/01/2008  . TACHYCARDIA 08/27/2008  . TREMOR 11/21/2007  . TRIGGER FINGER 10/30/2009   Past Surgical History:  Procedure Laterality Date  . CATARACT EXTRACTION, BILATERAL    . COLONOSCOPY     Social History:   reports that she has never smoked. She has never used smokeless tobacco. She reports that she does not drink alcohol or use drugs.  Family History  Problem Relation Age of Onset  . Thyroid disease Neg Hx   . Colon cancer Neg Hx   . Colon polyps Neg Hx   . Rectal cancer Neg Hx   . Stomach cancer Neg Hx     Medications: Allergies as of 04/14/2017   No Known Allergies     Medication List       Accurate as of 04/14/17 11:50 AM. Always use your most recent  med list.          alendronate 70 MG tablet Commonly known as:  FOSAMAX Take 1 tablet (70 mg total) by mouth once a week. Take with a full glass of water on an empty stomach.   amLODipine 10 MG tablet Commonly known as:  NORVASC Take 1 tablet (10 mg total) by mouth daily.   amoxicillin-clavulanate 875-125 MG tablet Commonly known as:  AUGMENTIN Take 1 tablet  by mouth every 12 (twelve) hours. For 5 more days   cholecalciferol 1000 units tablet Commonly known as:  VITAMIN D Take 1,000 Units by mouth daily.   clonazePAM 0.5 MG tablet Commonly known as:  KLONOPIN Take 1 tablet (0.5 mg total) by mouth 2 (two) times daily as needed for anxiety.   levETIRAcetam 500 MG tablet Commonly known as:  KEPPRA Take 1 tablet (500 mg total) by mouth 2 (two) times daily.   levothyroxine 50 MCG tablet Commonly known as:  SYNTHROID, LEVOTHROID Take 1 tablet (50 mcg total) by mouth daily.   metoprolol tartrate 25 MG tablet Commonly known as:  LOPRESSOR Take 1 tablet (25 mg total) by mouth 2 (two) times daily.   multivitamin tablet Take 1 tablet by mouth daily.   triamcinolone cream 0.1 % Commonly known as:  KENALOG APPLY  CREAM EXTERNALLY THREE TIMES DAILY AS NEEDED FOR ITCHING   vitamin E 100 UNIT capsule Take 100 Units by mouth 2 (two) times daily.       Immunizations: Immunization History  Administered Date(s) Administered  . Influenza Whole 08/23/2009, 08/23/2010, 08/24/2011  . Influenza, High Dose Seasonal PF 09/24/2014, 07/05/2015  . Influenza-Unspecified 07/24/2013     Physical Exam: Vitals:   04/14/17 1140  BP: 140/70  Pulse: 90  Resp: 20  Temp: 99.1 F (37.3 C)  TempSrc: Oral  SpO2: 98%  Weight: 135 lb (61.2 kg)  Height: 5\' 3"  (1.6 m)   Body mass index is 23.91 kg/m.  General- elderly female, well built, in no acute distress Head- normocephalic, atraumatic Nose- no maxillary or frontal sinus tenderness, no nasal discharge Throat- moist mucus membrane, normal oropharynx  Eyes- PERRLA, EOMI, no pallor, no icterus, no discharge, normal conjunctiva, normal sclera Neck- no cervical lymphadenopathy Cardiovascular- normal s1,s2, no murmur Respiratory- bilateral clear to auscultation, no wheeze, no rhonchi, no crackles, no use of accessory muscles Abdomen- bowel sounds present, soft, mildly distended, non tender, no  guarding or rigidity Musculoskeletal- able to move all 4 extremities,generalized weakness, no leg edema Neurological- alert and oriented to self and person only Skin- warm and dry, multiple dry scab like skin lesions to her legs from constant picking, erythematous area to right cubital fossa that is non pruritic and no visible lesions or open area, no scratch mark, per caregiver, this area had iv line and a tape.  Psychiatry- poor attention span, normal mood   Labs reviewed: Basic Metabolic Panel:  Recent Labs  40/98/11 1819 04/07/17 0316 04/08/17 0222 04/09/17 0340 04/10/17 0319 04/11/17 0322 04/13/17 0351  NA  --  133* 134* 140 136 132* 130*  K  --  4.0 3.9 4.0 3.9 3.9 3.3*  CL  --  102 103 109 107 101 94*  CO2  --  25 23 21* 22 22 27   GLUCOSE  --  135* 167* 128* 110* 123* 139*  BUN  --  10 14 22* 20 10 8   CREATININE  --  0.61 0.61 0.60 0.65 0.54 0.55  CALCIUM  --  7.5* 7.8* 7.8* 7.5* 8.0*  8.1*  MG 1.7 1.7 1.8 2.2  --   --   --   PHOS 2.7  --   --  1.7*  --   --   --    Liver Function Tests:  Recent Labs  04/06/17 1213  AST 30  ALT 19  ALKPHOS 69  BILITOT 0.7  PROT 7.1  ALBUMIN 3.8   No results for input(s): LIPASE, AMYLASE in the last 8760 hours. No results for input(s): AMMONIA in the last 8760 hours. CBC:  Recent Labs  09/08/16 1419 04/06/17 1213  04/10/17 0319 04/11/17 0322 04/13/17 0351  WBC 8.8 13.5*  < > 11.3* 10.7* 13.2*  NEUTROABS 5.6 11.5*  --   --   --   --   HGB 13.1 12.5  < > 10.1* 11.4* 11.7*  HCT 39.2 38.4  < > 31.7* 35.0* 35.0*  MCV 94.7 94.3  < > 95.8 94.9 93.3  PLT 319.0 235  < > 246 258 343  < > = values in this interval not displayed. Cardiac Enzymes:  Recent Labs  04/06/17 1819  CKTOTAL 85   BNP: Invalid input(s): POCBNP CBG: No results for input(s): GLUCAP in the last 8760 hours.  Radiological Exams: Ct Head Wo Contrast  Result Date: 04/07/2017 CLINICAL DATA:  Subdural hematoma follow-up EXAM: CT HEAD WITHOUT CONTRAST  TECHNIQUE: Contiguous axial images were obtained from the base of the skull through the vertex without intravenous contrast. COMPARISON:  Head CT 04/06/2017 FINDINGS: Brain: Intraparenchymal hematoma in the left temporal lobe measures 4.3 x 2.2 x 4.4 cm (volume = 22 cm^3), previously 4.1 x 2.0 x 4.4 cm (volume = 19 cm^3). Left convexity subdural hematoma is unchanged in thickness measuring 5-6 mm. Hematoma extends along the left leaflet of the cerebellar tentorium. There is mass effect on the lateral ventricles, but no hydrocephalus or ventricular trapping. There is a rightward midline shift measuring 7 mm, previously 6 mm. No new area of hemorrhage. Basal cisterns remain patent. No cerebellar tonsillar herniation. Vascular: No hyperdense vessel or unexpected calcification. Skull: Normal visualized skull base, calvarium and extracranial soft tissues. Sinuses/Orbits: No sinus fluid levels or advanced mucosal thickening. No mastoid effusion. Normal orbits. IMPRESSION: 1. Slightly increased size of left temporal lobe intraparenchymal hematoma with very slight increase in rightward midline shift common of 7 mm. No new area of hemorrhage. 2. Unchanged size of left convexity subdural hematoma. 3. No hydrocephalus. Electronically Signed   By: Deatra Robinson M.D.   On: 04/07/2017 05:52   Ct Head Wo Contrast  Result Date: 04/06/2017 CLINICAL DATA:  Found covered and vomit at care facility, last seen normal at 1700 hours. RIGHT occipital hematoma. Recently admitted for sepsis. EXAM: CT HEAD WITHOUT CONTRAST TECHNIQUE: Contiguous axial images were obtained from the base of the skull through the vertex without intravenous contrast. COMPARISON:  CT HEAD November 12, 2008 and MRI of the head September 14, 2011 FINDINGS: BRAIN: 2 x 4.1 cm LEFT temporal lobe intraparenchymal hematoma with subcentimeter satellite hematomas. Low-density surrounding vasogenic edema. In addition, 7 mm dense LEFT holo hemispheric subdural hematoma.  Acute bilateral cerebellar tentorial subdural hematomas measuring to 3 mm extending to the RIGHT interhemispheric fissure. 7 mm LEFT-to-RIGHT midline shift. Mild mass effect LEFT lateral ventricle without entrapment or hydrocephalus. Basal cisterns are patent. VASCULAR: Mild calcific atherosclerosis of the carotid siphons. SKULL: No skull fracture. Small RIGHT parietal scalp hematoma without subcutaneous gas or radiopaque foreign bodies. SINUSES/ORBITS: Status post RIGHT status with RIGHT sphenoid sinus air-fluid level. Mild  paranasal sinus mucosal thickening. Under pneumatized, well-aerated mastoid aircells. The included ocular globes and orbital contents are non-suspicious. Status post bilateral ocular lens implants. OTHER: None. IMPRESSION: 1. Acute 2 x 4.1 cm LEFT temporal lobe hematoma with surrounding subcentimeter intraparenchymal hemorrhages. 2. Acute LEFT holo hemispheric subdural hematoma with acute falcotentorial subdural hematoma measuring to 3 mm. 3. 7 mm LEFT-to-RIGHT midline shift without ventricle entrapment. 4. Small RIGHT parietal scalp hematoma, no skull fracture. Critical Value/emergent results were called by telephone at the time of interpretation on 04/06/2017 at 1:55 pm to Dr. Loren Racer , who verbally acknowledged these results. Electronically Signed   By: Awilda Metro M.D.   On: 04/06/2017 14:06   Ct Cervical Spine Wo Contrast  Result Date: 04/06/2017 CLINICAL DATA:  Fall EXAM: CT CERVICAL SPINE WITHOUT CONTRAST TECHNIQUE: Multidetector CT imaging of the cervical spine was performed without intravenous contrast. Multiplanar CT image reconstructions were also generated. COMPARISON:  04/06/2017 FINDINGS: Alignment: Trace retrolisthesis of C4 on C5. Straightening of the cervical spine. Skull base and vertebrae: Craniovertebral junction is intact. There is no fracture identified. Mildly hypoplastic appearing C5 and C6 vertebral bodies with fusion. Soft tissues and spinal canal: No  prevertebral fluid or swelling. No visible canal hematoma. Disc levels: Marked disc space narrowing, endplate change and osteophyte at C4-C5 with posterior disc osteophyte complex and mild canal stenosis. Moderate to marked bilateral foraminal narrowing at this level. Moderate narrowing at C6-C7 with moderate foraminal narrowing. Upper chest: Negative. Other: None IMPRESSION: 1. No definite acute osseous abnormality 2. 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 Moderate severe degenerative disc changes at C4-C5 and C6-C7. Electronically Signed   By: Jasmine Pang M.D.   On: 04/06/2017 17:43   Dg Chest Port 1 View  Result Date: 04/07/2017 CLINICAL DATA:  Followup respiratory failure. EXAM: PORTABLE CHEST 1 VIEW COMPARISON:  04/06/2017 FINDINGS: Chronic cardiomegaly with left ventricular prominence. Chronic aortic atherosclerosis. Patchy density in both lower lobes consistent with mild atelectasis/ pneumonia. Upper lungs are clear. No evidence heart failure or effusion. No acute bone finding. IMPRESSION: Cardiomegaly. Persistent patchy lower lobe density consistent with atelectasis or mild pneumonia. Electronically Signed   By: Paulina Fusi M.D.   On: 04/07/2017 07:25   Dg Chest Port 1 View  Result Date: 04/06/2017 CLINICAL DATA:  Sepsis, emesis and unresponsive. EXAM: PORTABLE CHEST 1 VIEW COMPARISON:  02/05/2014 CXR FINDINGS: Cardiomegaly with aortic atherosclerosis. Right basilar atelectasis and/or scarring. No pneumonic consolidation, CHF nor effusion. No pneumothorax. No acute nor suspicious osseous abnormality. IMPRESSION: Cardiomegaly with aortic atherosclerosis. Right basilar atelectasis and/or scarring. Otherwise negative exam. Electronically Signed   By: Tollie Eth M.D.   On: 04/06/2017 22:17    Assessment/Plan  Physical deconditioning From generalized weakness. Will have patient work with PT/OT as tolerated to regain strength and restore function.  Fall  precautions are in place.  Unsteady gait From deconditioning. Will have her work with physical therapy and occupational therapy team to help with gait training and muscle strengthening exercises.fall precautions. Skin care. Encourage to be out of bed.   Intracranial bleed Thought to be from trauma vs HTN. Will continue keppra for seizure prophylaxis. Will need follow up with neurosurgery. Fall precautions. Continue antihypertensives  HTN Stable BP, continue amlodipine 10 mg daily along with metoprolol tartrate 25 mg bid. Check BP bid for now. Adjust bp med if needed with goal BP <140/90  Aspiration pneumonia Continue and complete course of augmentin q12h on 04/18/17. Aspiration precautions and SLP  to follow.  Leukocytosis Currently on rx for aspiration pneumonia. Monitor clinically. Afebrile.   Blood loss anemia From intracranial bleed. Monitor h&h  Hyponatremia Check bmp  Seizure prophylaxis With recent intracranila bleed. Continue keppra until seen by neurosurgery.   Dysphagia Continue dysphagia diet and aspiration precautions. SLP to evaluate.   Hypokalemia Check bmp  Skin rash Appears to be a reaction to tape. Will document this in allergy list. Provide skin care and monitor   Picking disorder Continue clonazepam 0.5 mg bid prn and monitor. Continue triamcinolone cream tid prn to affected areas on her legs.   Osteoporosis Continue weekly alendronate and monitor. Continue vitamin d supplement.  Hypothyroidism Continue levothyroxine 50 mcg daily.   Deafness Will need sign interpreter for communication. Supportive care  Autism Supportive care   Goals of care: short term rehabilitation   Labs/tests ordered: cbc, cmp 04/20/17   Family/ staff Communication: reviewed care plan with patient, her HCPOA, caregiver and nursing supervisor.    Oneal Grout, MD Internal Medicine [facility] [address] [place], [place]  [address] Cell Phone (Monday-Friday 8 am - 5 pm): [phone] On Call: [phone] and follow prompts after 5 pm and on weekends Office Phone: [phone] Office Fax: [phone]

## 2017-04-20 LAB — BASIC METABOLIC PANEL
BUN: 13 mg/dL (ref 4–21)
CREATININE: 0.6 mg/dL (ref 0.5–1.1)
GLUCOSE: 146 mg/dL
Potassium: 4.3 mmol/L (ref 3.4–5.3)
SODIUM: 134 mmol/L — AB (ref 137–147)

## 2017-04-20 LAB — CBC AND DIFFERENTIAL
HEMATOCRIT: 39 % (ref 36–46)
Hemoglobin: 12.7 g/dL (ref 12.0–16.0)
Platelets: 499 10*3/uL — AB (ref 150–399)
WBC: 11.6 10^3/mL

## 2017-04-22 ENCOUNTER — Encounter: Payer: Self-pay | Admitting: Family

## 2017-04-22 ENCOUNTER — Non-Acute Institutional Stay (SKILLED_NURSING_FACILITY): Payer: Medicare Other | Admitting: Family

## 2017-04-22 DIAGNOSIS — D72829 Elevated white blood cell count, unspecified: Secondary | ICD-10-CM | POA: Diagnosis not present

## 2017-04-22 NOTE — Progress Notes (Signed)
Location:  [facility] Room Number: 1201P Place of Service:  SNF [phone]) Provider: Margalit Leece FNP-C  Romero Belling, MD  Patient Care Team: Romero Belling, MD as PCP - General Billie Ruddy, OD as Referring Physician (Optometry) Elinor Parkinson, DPM as Consulting Physician (Podiatry)  Extended Emergency Contact Information Primary Emergency Contact: Bonita Quin, [place] Work Phone: [phone] Relation: Other Secondary Emergency Contact: Maustaby,Charles  [place] Mobile Phone: [phone] Relation: Relative  Code Status: DNR  Goals of care: Advanced Directive information Advanced Directives 04/22/2017  Does Patient Have a Medical Advance Directive? Yes  Type of Advance Directive Out of facility DNR (pink MOST or yellow form)  Does patient want to make changes to medical advance directive? -  Would patient like information on creating a medical advance directive? -  Pre-existing out of facility DNR order (yellow form or pink MOST form) Yellow form placed in chart (order not valid for inpatient use)     Chief Complaint  Patient presents with  . Acute Visit    Abnormal labs    HPI:  Pt is a 76 y.o. female seen today at [facility] for an acute visit for evaluation of abnormal lab results. She is seen in her room today.she communicate by sign language.Per translator she denies any cough, fever, chills or UTI symptoms. Her recent lab results showed WBC 11.6 ( 04/20/2017). Previous lab worked reviewed showed WBC trending down previous 13.2 ( 04/13/2017).    Past Medical History:  Diagnosis Date  . Anxiety state, unspecified 10/10/2007  . Arthritis   . ASYMPTOMATIC POSTMENOPAUSAL STATUS 04/15/2009  . Cataract    removed both eyes   . Deaf   . Dermatitis   . Headache(784.0) 08/27/2008  . HYPERLIPIDEMIA 06/22/2007   rx refused  . HYPERTENSION 05/07/2008  .  HYPOTHYROIDISM 04/15/2009  . Mild mental retardation   . 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 RESTLESS LEG SYNDROME 10/01/2008  . TACHYCARDIA 08/27/2008  . TREMOR 11/21/2007  . TRIGGER FINGER 10/30/2009   Past Surgical History:  Procedure Laterality Date  . CATARACT EXTRACTION, BILATERAL    . COLONOSCOPY      No Known Allergies  Allergies as of 04/22/2017   No Known Allergies     Medication List       Accurate as of 04/22/17  3:15 PM. Always use your most recent med list.          alendronate 70 MG tablet Commonly known as:  FOSAMAX Take 1 tablet (70 mg total) by mouth once a week. Take with a full glass of water on an empty stomach.   amLODipine 10 MG tablet Commonly known as:  NORVASC Take 1 tablet (10 mg total) by mouth daily.   cholecalciferol 1000 units tablet Commonly known as:  VITAMIN D Take 1,000 Units by mouth daily.   clonazePAM 0.5 MG tablet Commonly known as:  KLONOPIN Take 1 tablet (0.5 mg total) by mouth 2 (two) times daily as needed for anxiety.   levETIRAcetam 500 MG tablet Commonly known as:  KEPPRA Take 1 tablet (500 mg total) by mouth 2 (two) times daily.   levothyroxine 50 MCG tablet Commonly known as:  SYNTHROID, LEVOTHROID Take 1 tablet (50 mcg total) by mouth daily.   metoprolol tartrate 25 MG tablet Commonly known as:  LOPRESSOR Take 1 tablet (25 mg total) by mouth 2 (two) times  daily.   multivitamin tablet Take 1 tablet by mouth daily.   triamcinolone cream 0.1 % Commonly known as:  KENALOG APPLY  CREAM EXTERNALLY THREE TIMES DAILY AS NEEDED FOR ITCHING   vitamin E 100 UNIT capsule Take 100 Units by mouth 2 (two) times daily.       Review of Systems  Constitutional: Negative for activity change, appetite change, chills, fatigue and fever.  HENT: Negative for congestion, rhinorrhea, sinus pain, sinus pressure, sneezing and sore throat.        Deaf communicates via sign language through Nurse, learning disability.   Eyes: Negative.     Respiratory: Negative for cough, chest tightness, shortness of breath and wheezing.   Cardiovascular: Positive for leg swelling. Negative for chest pain and palpitations.  Gastrointestinal: Negative for abdominal distention, abdominal pain, constipation, diarrhea, nausea and vomiting.  Genitourinary: Negative for dysuria, flank pain, frequency and urgency.  Musculoskeletal: Positive for gait problem.  Skin: Negative for color change, pallor and rash.  Neurological: Negative for dizziness, seizures, light-headedness and headaches.  Hematological: Does not bruise/bleed easily.  Psychiatric/Behavioral: Negative for agitation, confusion and sleep disturbance. The patient is not nervous/anxious.     Immunization History  Administered Date(s) Administered  . Influenza Whole 08/23/2009, 08/23/2010, 08/24/2011  . Influenza, High Dose Seasonal PF 09/24/2014, 07/05/2015  . Influenza-Unspecified 07/24/2013  . PPD Test 04/13/2017   Pertinent  Health Maintenance Due  Topic Date Due  . MAMMOGRAM  03/20/1993  . PNA vac Low Risk Adult (1 of 2 - PCV13) 03/07/2019 (Originally 03/20/2008)  . INFLUENZA VACCINE  06/23/2017  . COLONOSCOPY  11/13/2026  . DEXA SCAN  Completed   No flowsheet data found. Functional Status Survey:    Vitals:   04/22/17 1357  BP: (!) 150/80  Pulse: (!) 58  Resp: 20  Temp: 97 F (36.1 C)  SpO2: 98%  Weight: 112 lb 12.8 oz (51.2 kg)  Height: 5\' 3"  (1.6 m)   Body mass index is 19.98 kg/m. Physical Exam  Constitutional: She appears well-developed and well-nourished. No distress.  HENT:  Head: Normocephalic.  Mouth/Throat: Oropharynx is clear and moist. No oropharyngeal exudate.  Deaf   Eyes: Conjunctivae and EOM are normal. Pupils are equal, round, and reactive to light. Right eye exhibits no discharge. Left eye exhibits no discharge. No scleral icterus.  Neck: Normal range of motion. No JVD present. No thyromegaly present.  Cardiovascular: Normal rate, regular  rhythm, normal heart sounds and intact distal pulses.  Exam reveals no gallop and no friction rub.   No murmur heard. Pulmonary/Chest: Effort normal and breath sounds normal. No respiratory distress. She has no wheezes. She has no rales.  Abdominal: Soft. Bowel sounds are normal. She exhibits no distension. There is no tenderness. There is no rebound and no guarding.  Musculoskeletal: She exhibits no tenderness.  Unsteady gait. Lower extremities trace -1+ edema.   Lymphadenopathy:    She has no cervical adenopathy.  Neurological: She is alert.  Skin: Skin is warm and dry. No rash noted. No erythema. No pallor.  Psychiatric: She has a normal mood and affect.    Labs reviewed:  Recent Labs  04/06/17 1819 04/07/17 0316 04/08/17 0222 04/09/17 0340 04/10/17 0319 04/11/17 0322 04/13/17 0351 04/20/17  NA  --  133* 134* 140 136 132* 130* 134*  K  --  4.0 3.9 4.0 3.9 3.9 3.3* 4.3  CL  --  102 103 109 107 101 94*  --   CO2  --  25 23 21* 22  22 27  --   GLUCOSE  --  135* 167* 128* 110* 123* 139*  --   BUN  --  10 14 22* 20 10 8 13   CREATININE  --  0.61 0.61 0.60 0.65 0.54 0.55 0.6  CALCIUM  --  7.5* 7.8* 7.8* 7.5* 8.0* 8.1*  --   MG 1.7 1.7 1.8 2.2  --   --   --   --   PHOS 2.7  --   --  1.7*  --   --   --   --     Recent Labs  04/06/17 1213  AST 30  ALT 19  ALKPHOS 69  BILITOT 0.7  PROT 7.1  ALBUMIN 3.8    Recent Labs  09/08/16 1419 04/06/17 1213  04/10/17 0319 04/11/17 0322 04/13/17 0351 04/20/17  WBC 8.8 13.5*  < > 11.3* 10.7* 13.2* 11.6  NEUTROABS 5.6 11.5*  --   --   --   --   --   HGB 13.1 12.5  < > 10.1* 11.4* 11.7* 12.7  HCT 39.2 38.4  < > 31.7* 35.0* 35.0* 39  MCV 94.7 94.3  < > 95.8 94.9 93.3  --   PLT 319.0 235  < > 246 258 343 499*  < > = values in this interval not displayed. Lab Results  Component Value Date   TSH 2.29 03/23/2017   Lab Results  Component Value Date   HGBA1C 6.6 (H) 03/23/2017   Lab Results  Component Value Date   CHOL 202 (H)  09/08/2016   HDL 54.40 09/08/2016   LDLDIRECT 95.0 09/08/2016   TRIG (H) 09/08/2016    414.0 Triglyceride is over 400; calculations on Lipids are invalid.   CHOLHDL 4 09/08/2016    Assessment/Plan 1. Leukocytosis Afebrile.WBC 11.6 ( 04/20/2017). Previous lab worked reviewed showed WBC trending down previous 13.2 ( 04/13/2017).Bilateral lungs CTA. Will obtain urine specimen for U/A and C/S rule out UTI. Recheck CBC/diff 04/26/2017. Continue to monitor Temp curve.   2. Edema Bilateral lower extremities trace- 1+ apply knee high ted hose on in the morning and off at bedtime. Continue to monitor.    Family/ staff Communication: Reviewed plan of care with patient and facility Nurse supervisor  Labs/tests ordered: CBC/diff 04/26/2017  Caesar Bookmaninah C Otie Headlee, NP

## 2017-05-18 ENCOUNTER — Encounter: Payer: Self-pay | Admitting: Endocrinology

## 2017-05-18 ENCOUNTER — Ambulatory Visit (INDEPENDENT_AMBULATORY_CARE_PROVIDER_SITE_OTHER): Payer: Medicare Other | Admitting: Endocrinology

## 2017-05-18 VITALS — BP 120/74 | HR 81 | Wt 116.0 lb

## 2017-05-18 DIAGNOSIS — D649 Anemia, unspecified: Secondary | ICD-10-CM | POA: Diagnosis not present

## 2017-05-18 DIAGNOSIS — I10 Essential (primary) hypertension: Secondary | ICD-10-CM | POA: Diagnosis not present

## 2017-05-18 DIAGNOSIS — I6200 Nontraumatic subdural hemorrhage, unspecified: Secondary | ICD-10-CM

## 2017-05-18 DIAGNOSIS — S065X9A Traumatic subdural hemorrhage with loss of consciousness of unspecified duration, initial encounter: Secondary | ICD-10-CM

## 2017-05-18 DIAGNOSIS — S065XAA Traumatic subdural hemorrhage with loss of consciousness status unknown, initial encounter: Secondary | ICD-10-CM

## 2017-05-18 LAB — CBC WITH DIFFERENTIAL/PLATELET
BASOS PCT: 1 % (ref 0.0–3.0)
Basophils Absolute: 0.1 10*3/uL (ref 0.0–0.1)
Eosinophils Absolute: 0.2 10*3/uL (ref 0.0–0.7)
Eosinophils Relative: 2.3 % (ref 0.0–5.0)
HCT: 38.1 % (ref 36.0–46.0)
Hemoglobin: 12.6 g/dL (ref 12.0–15.0)
Lymphocytes Relative: 15.4 % (ref 12.0–46.0)
Lymphs Abs: 1.2 10*3/uL (ref 0.7–4.0)
MCHC: 33 g/dL (ref 30.0–36.0)
MCV: 93.9 fl (ref 78.0–100.0)
MONO ABS: 0.8 10*3/uL (ref 0.1–1.0)
Monocytes Relative: 10.6 % (ref 3.0–12.0)
NEUTROS ABS: 5.5 10*3/uL (ref 1.4–7.7)
NEUTROS PCT: 70.7 % (ref 43.0–77.0)
PLATELETS: 305 10*3/uL (ref 150.0–400.0)
RBC: 4.05 Mil/uL (ref 3.87–5.11)
RDW: 12.8 % (ref 11.5–15.5)
WBC: 7.7 10*3/uL (ref 4.0–10.5)

## 2017-05-18 LAB — IBC PANEL
Iron: 51 ug/dL (ref 42–145)
Saturation Ratios: 14.9 % — ABNORMAL LOW (ref 20.0–50.0)
Transferrin: 244 mg/dL (ref 212.0–360.0)

## 2017-05-18 LAB — BASIC METABOLIC PANEL
BUN: 15 mg/dL (ref 6–23)
CO2: 32 mEq/L (ref 19–32)
CREATININE: 0.75 mg/dL (ref 0.40–1.20)
Calcium: 9.2 mg/dL (ref 8.4–10.5)
Chloride: 100 mEq/L (ref 96–112)
GFR: 80.25 mL/min (ref >60.00)
Glucose, Bld: 152 mg/dL — ABNORMAL HIGH (ref 70–99)
POTASSIUM: 4.2 meq/L (ref 3.5–5.1)
Sodium: 136 mEq/L (ref 135–145)

## 2017-05-18 NOTE — Patient Instructions (Addendum)
Please see a neurology specialist.  you will receive a phone call, about a day and time for an appointment. I have also requested home-health.   blood tests are requested for you today.  We'll let you know about the results. No medication is needed for the blood diabetes now. Please continue the same medication for blood pressure.   We are doing our best with clonazepam (we have to weigh benefit vs side-effects) Please come back for a follow-up appointment in 2 months.

## 2017-05-18 NOTE — Progress Notes (Signed)
Subjective:    Patient ID: Laura [name], female    DOB: December 14, 1940, 76 y.o.   MRN: [id number]  HPI  The state of at least three ongoing medical problems is addressed today, with interval history of each noted here: Pt was recently in the hospital for SDH.  She is here with Laura CorinSusan [name] [facility] who provides hx, due to pt's memory loss.  Pt also continues to struggle with ADL's such as dressing and eating.  Facility prepares food for her.  Also, pt intermittently does not recognize familiar persons.  She is now at [facility].  She has now at independent care level with RTC assistance.  Pt also has slightly worse strength of the LE's, and assoc memory loss.  Hyponatremia was noted in the hospital.  Stable problem. DM was also recently noted.  Stable problem Past Medical History:  Diagnosis Date  . Anxiety state, unspecified 10/10/2007  . Arthritis   . ASYMPTOMATIC POSTMENOPAUSAL STATUS 04/15/2009  . Cataract    removed both eyes   . Deaf   . Dermatitis   . Headache(784.0) 08/27/2008  . HYPERLIPIDEMIA 06/22/2007   rx refused  . HYPERTENSION 05/07/2008  . HYPOTHYROIDISM 04/15/2009  . Mild mental retardation   . 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 RESTLESS LEG SYNDROME 10/01/2008  . TACHYCARDIA 08/27/2008  . TREMOR 11/21/2007  . TRIGGER FINGER 10/30/2009    Past Surgical History:  Procedure Laterality Date  . CATARACT EXTRACTION, BILATERAL    . COLONOSCOPY      Social History   Social History  . Marital status: Single    Spouse name: N/A  . Number of children: N/A  . Years of education: N/A   Occupational History  . Disabled Unemployed   Social History Main Topics  . Smoking status: Never Smoker  . Smokeless tobacco: Never Used  . Alcohol use No  . Drug use: No  . Sexual activity: Not on file   Other Topics Concern  . Not on file   Social History Narrative   Lives alone   Never married   Physical activity is very good   She has never had a driver's license     Does not have a phone at home     Current Outpatient Prescriptions on File Prior to Visit  Medication Sig Dispense Refill  . alendronate (FOSAMAX) 70 MG tablet Take 1 tablet (70 mg total) by mouth once a week. Take with a full glass of water on an empty stomach. 4 tablet 11  . amLODipine (NORVASC) 10 MG tablet Take 1 tablet (10 mg total) by mouth daily.    . cholecalciferol (VITAMIN D) 1000 UNITS tablet Take 1,000 Units by mouth daily.    . clonazePAM (KLONOPIN) 0.5 MG tablet Take 1 tablet (0.5 mg total) by mouth 2 (two) times daily as needed for anxiety. 15 tablet 0  . levETIRAcetam (KEPPRA) 500 MG tablet Take 1 tablet (500 mg total) by mouth 2 (two) times daily.    Marland Kitchen. levothyroxine (SYNTHROID, LEVOTHROID) 50 MCG tablet Take 1 tablet (50 mcg total) by mouth daily. 90 tablet 1  . metoprolol tartrate (LOPRESSOR) 25 MG tablet Take 1 tablet (25 mg total) by mouth 2 (two) times daily. 30 tablet 0  . Multiple Vitamin (MULTIVITAMIN) tablet Take 1 tablet by mouth daily.      Marland Kitchen. triamcinolone cream (KENALOG) 0.1 % APPLY  CREAM EXTERNALLY THREE TIMES DAILY AS NEEDED FOR ITCHING 80 g 2  . vitamin E 100  UNIT capsule Take 100 Units by mouth 2 (two) times daily.       No current facility-administered medications on file prior to visit.     No Known Allergies  Family History  Problem Relation Age of Onset  . Thyroid disease Neg Hx   . Colon cancer Neg Hx   . Colon polyps Neg Hx   . Rectal cancer Neg Hx   . Stomach cancer Neg Hx     BP 120/74 (BP Location: Left Arm, Patient Position: Sitting)   Pulse 81   Wt 116 lb (52.6 kg)   SpO2 96%   BMI 20.55 kg/m    Review of Systems No further falls or LOC.      Objective:   Physical Exam VITAL SIGNS:  See vs page GENERAL: no distress Gait is steady with a walker.   Lab Results  Component Value Date   HGBA1C 6.6 (H) 03/23/2017   Lab Results  Component Value Date   TSH 2.29 03/23/2017      Assessment & Plan:  Memory loss, worse: she  may need a higher level of care.   SDH: improved.  [name] requests neurol f/u HTN: well-controlled DM: stable. no medication is needed now. Hyponatremia: stable   Patient Instructions  Please see a neurology specialist.  you will receive a phone call, about a day and time for an appointment. I have also requested home-health.   blood tests are requested for you today.  We'll let you know about the results. No medication is needed for the blood diabetes now. Please continue the same medication for blood pressure.   We are doing our best with clonazepam (we have to weigh benefit vs side-effects) Please come back for a follow-up appointment in 2 months.

## 2017-05-20 ENCOUNTER — Telehealth: Payer: Self-pay | Admitting: Endocrinology

## 2017-05-20 DIAGNOSIS — I6200 Nontraumatic subdural hemorrhage, unspecified: Secondary | ICD-10-CM | POA: Diagnosis not present

## 2017-05-20 DIAGNOSIS — R413 Other amnesia: Secondary | ICD-10-CM | POA: Diagnosis not present

## 2017-05-20 DIAGNOSIS — H913 Deaf nonspeaking, not elsewhere classified: Secondary | ICD-10-CM | POA: Diagnosis not present

## 2017-05-20 DIAGNOSIS — I10 Essential (primary) hypertension: Secondary | ICD-10-CM | POA: Diagnosis not present

## 2017-05-20 NOTE — Telephone Encounter (Signed)
[place] notified of verbal ok.

## 2017-05-20 NOTE — Telephone Encounter (Signed)
Would like to get a verbal order for home health physical therapy services and would also like approval for speech therapy evaluation. Please advise.

## 2017-05-20 NOTE — Telephone Encounter (Signed)
OK 

## 2017-05-20 NOTE — Telephone Encounter (Signed)
See message and please advise, Thanks!  

## 2017-05-28 DIAGNOSIS — I6200 Nontraumatic subdural hemorrhage, unspecified: Secondary | ICD-10-CM | POA: Diagnosis not present

## 2017-05-28 DIAGNOSIS — I10 Essential (primary) hypertension: Secondary | ICD-10-CM | POA: Diagnosis not present

## 2017-05-28 DIAGNOSIS — H913 Deaf nonspeaking, not elsewhere classified: Secondary | ICD-10-CM | POA: Diagnosis not present

## 2017-05-28 DIAGNOSIS — R413 Other amnesia: Secondary | ICD-10-CM | POA: Diagnosis not present

## 2017-06-01 DIAGNOSIS — I6200 Nontraumatic subdural hemorrhage, unspecified: Secondary | ICD-10-CM | POA: Diagnosis not present

## 2017-06-01 DIAGNOSIS — R413 Other amnesia: Secondary | ICD-10-CM | POA: Diagnosis not present

## 2017-06-01 DIAGNOSIS — I10 Essential (primary) hypertension: Secondary | ICD-10-CM | POA: Diagnosis not present

## 2017-06-01 DIAGNOSIS — H913 Deaf nonspeaking, not elsewhere classified: Secondary | ICD-10-CM | POA: Diagnosis not present

## 2017-06-03 ENCOUNTER — Encounter: Payer: Self-pay | Admitting: Neurology

## 2017-06-03 ENCOUNTER — Ambulatory Visit (INDEPENDENT_AMBULATORY_CARE_PROVIDER_SITE_OTHER): Payer: Medicare Other | Admitting: Neurology

## 2017-06-03 VITALS — BP 140/74 | HR 88 | Ht 60.5 in | Wt 116.0 lb

## 2017-06-03 DIAGNOSIS — I611 Nontraumatic intracerebral hemorrhage in hemisphere, cortical: Secondary | ICD-10-CM

## 2017-06-03 DIAGNOSIS — S065XAA Traumatic subdural hemorrhage with loss of consciousness status unknown, initial encounter: Secondary | ICD-10-CM

## 2017-06-03 DIAGNOSIS — I6200 Nontraumatic subdural hemorrhage, unspecified: Secondary | ICD-10-CM

## 2017-06-03 DIAGNOSIS — R4189 Other symptoms and signs involving cognitive functions and awareness: Secondary | ICD-10-CM | POA: Diagnosis not present

## 2017-06-03 DIAGNOSIS — S065X9A Traumatic subdural hemorrhage with loss of consciousness of unspecified duration, initial encounter: Secondary | ICD-10-CM

## 2017-06-03 NOTE — Progress Notes (Signed)
NEUROLOGY CONSULTATION NOTE  Laura Shortsancy M Elmquist MRN: [id number] DOB: Sep 15, 1941  Referring provider: Dr. Romero BellingSean Ellison Primary care provider: Dr. Romero BellingSean Ellison  Reason for consult:  Subdural hematoma  Dear Dr Everardo AllEllison:  Thank you for your kind referral of Laura [name] for consultation of the above symptoms. Although her history is well known to you, please allow me to reiterate it for the purpose of our medical record. The patient was accompanied to the clinic by Laura [name] [facility](HCPOA) and a family member who help supplement the history today. She has a sign language interpreter. Records and images were personally reviewed where available.  HISTORY OF PRESENT ILLNESS: This is a pleasant 76 year old woman with a history of hypertension, hyperlipidemia, autism spectrum disorder, deaf, presenting after hospitalization from May 15-22, 2018. Records were reviewed. She lives independently at [facility] and was last known normal the evening before admission. She had not been seen the next morning, and staff found her around 11am on a chair in her room, confused and covered in emesis. [facility] was [facility] where head CT showed a left temporal lobe hematoma with surrounding intraparenchymal hemorrhages, acute left holohemispheric subdural hematoma with acute falcotentorial subdural hematoma with left to right shift. There was a small right parietal scalp hematoma. No skull fracture. Bleed was felt to be traumatic in etiology. She was monitored clinically, no surgical intervention done. She started to become less confused and was discharged to rehab. She had some encephalopathy when first returning to [facility], she has been under 24/7 care and initially needed help with bathing, but has shown improvement, she can now dress and bathe independently. She reports she is doing fine, "I'm happy." She denies any headaches, dizziness, diplopia, dysphagia, neck/back pain, focal numbness/tingling/weakness,  bowel/bladder dysfunction. She has a history of an internal sensation of shaking in her legs, she states she takes medicine that helps. She had previously been ambulating independently but had balance issues, but since the bleed, balance has been worse and she has been using the walker. She would sometimes forget to use her walker and leave it in the room. Her HCPOA reports that staff has been trying to stimulate her and get her back to a routine. She functions on the autism spectrum where she is very rigid and rule-driven, she has to take her medications and meals at specific times. They are trying to get her back to a set routine so she can stay independent, they have not trained her to put on Depends at night . Her HCPOA reports she still has a lot of deficits, she has memory loss where things she used to remember off the top of her head are a struggle, such as names or an event. She would say "Laura [name] did this with me," but she actually did not. She used to finger spell quickly, now this is a struggle. Her reasoning skills have always been limited, but it is worse. Vision problems seem to have worsened as well. She is talking to herself a lot now, she has a video phone she uses to communicate with other hearing impaired persons, now she would pick up the phone and watch the video of herself, signing to herself as if communicating with another person, telling a whole story, then turning to Laura [name] and saying she is done. They watched some of them on video, she would get lost in part of the story or forget names and stumble. Sometimes she does this in front of a mirror.  When she first came home, she would stare off then come back, they are seeing less of this now, probably a couple of times a week. No convulsive activity or body jerks noted. She was started on Keppra in the hospital for seizure prophylaxis, she has not seen Neurosurgery since discharge and continues on medication with no significant side effects. She  states she takes her medications herself twice a day, she is pretty good with relating how and when she takes her medications.    PAST MEDICAL HISTORY: Past Medical History:  Diagnosis Date  . Anxiety state, unspecified 10/10/2007  . Arthritis   . ASYMPTOMATIC POSTMENOPAUSAL STATUS 04/15/2009  . Cataract    removed both eyes   . Deaf   . Dermatitis   . Headache(784.0) 08/27/2008  . HYPERLIPIDEMIA 06/22/2007   rx refused  . HYPERTENSION 05/07/2008  . HYPOTHYROIDISM 04/15/2009  . Mild mental retardation   . 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 RESTLESS LEG SYNDROME 10/01/2008  . TACHYCARDIA 08/27/2008  . TREMOR 11/21/2007  . TRIGGER FINGER 10/30/2009    PAST SURGICAL HISTORY: Past Surgical History:  Procedure Laterality Date  . CATARACT EXTRACTION, BILATERAL    . COLONOSCOPY      MEDICATIONS: Current Outpatient Prescriptions on File Prior to Visit  Medication Sig Dispense Refill  . alendronate (FOSAMAX) 70 MG tablet Take 1 tablet (70 mg total) by mouth once a week. Take with a full glass of water on an empty stomach. 4 tablet 11  . amLODipine (NORVASC) 10 MG tablet Take 1 tablet (10 mg total) by mouth daily.    . cholecalciferol (VITAMIN D) 1000 UNITS tablet Take 1,000 Units by mouth daily.    . clonazePAM (KLONOPIN) 0.5 MG tablet Take 1 tablet (0.5 mg total) by mouth 2 (two) times daily as needed for anxiety. 15 tablet 0  . levETIRAcetam (KEPPRA) 500 MG tablet Take 1 tablet (500 mg total) by mouth 2 (two) times daily.    Marland Kitchen levothyroxine (SYNTHROID, LEVOTHROID) 50 MCG tablet Take 1 tablet (50 mcg total) by mouth daily. 90 tablet 1  . metoprolol tartrate (LOPRESSOR) 25 MG tablet Take 1 tablet (25 mg total) by mouth 2 (two) times daily. 30 tablet 0  . Multiple Vitamin (MULTIVITAMIN) tablet Take 1 tablet by mouth daily.      Marland Kitchen triamcinolone cream (KENALOG) 0.1 % APPLY  CREAM EXTERNALLY THREE TIMES DAILY AS NEEDED FOR ITCHING 80 g 2  . vitamin E 100 UNIT capsule Take 100 Units by  mouth 2 (two) times daily.       No current facility-administered medications on file prior to visit.     ALLERGIES: No Known Allergies  FAMILY HISTORY: Family History  Problem Relation Age of Onset  . Thyroid disease Neg Hx   . Colon cancer Neg Hx   . Colon polyps Neg Hx   . Rectal cancer Neg Hx   . Stomach cancer Neg Hx     SOCIAL HISTORY: Social History   Social History  . Marital status: Single    Spouse name: N/A  . Number of children: N/A  . Years of education: N/A   Occupational History  . Disabled Unemployed   Social History Main Topics  . Smoking status: Never Smoker  . Smokeless tobacco: Never Used  . Alcohol use No  . Drug use: No  . Sexual activity: Not on file   Other Topics Concern  . Not on file   Social History Narrative   Lives alone  Never married   Physical activity is very good   She has never had a driver's license   Does not have a phone at home     REVIEW OF SYSTEMS: Constitutional: No fevers, chills, or sweats, no generalized fatigue, change in appetite Eyes: No visual changes, double vision, eye pain Ear, nose and throat: + hearing loss,no ear pain, nasal congestion, sore throat Cardiovascular: No chest pain, palpitations Respiratory:  No shortness of breath at rest or with exertion, wheezes GastrointestinaI: No nausea, vomiting, diarrhea, abdominal pain, fecal incontinence Genitourinary:  No dysuria, urinary retention or frequency Musculoskeletal:  No neck pain, back pain Integumentary: No rash, pruritus, skin lesions Neurological: as above Psychiatric: No depression, insomnia, anxiety Endocrine: No palpitations, fatigue, diaphoresis, mood swings, change in appetite, change in weight, increased thirst Hematologic/Lymphatic:  No anemia, purpura, petechiae. Allergic/Immunologic: no itchy/runny eyes, nasal congestion, recent allergic reactions, rashes  PHYSICAL EXAM: Vitals:   06/03/17 0924  BP: 140/74  Pulse: 88   General:  No acute distress, communicates well by sign language Head:  Normocephalic/atraumatic Eyes: Fundoscopic exam shows bilateral sharp discs, no vessel changes, exudates, or hemorrhages Neck: supple, no paraspinal tenderness, full range of motion Back: No paraspinal tenderness Heart: regular rate and rhythm Lungs: Clear to auscultation bilaterally. Vascular: No carotid bruits. Skin/Extremities: No rash, no edema Neurological Exam: Mental status: alert and oriented to person, place, states it is July 2018. Could not name city or state, day of week, which she could do prior to bleed. No aphasia, able to spell WORLD by sign language, name objects. Fund of knowledge is appropriate.  Recent and remote memory are intact. 3/3 delayed recall.  Attention and concentration are normal.     Cranial nerves: CN I: not tested CN II: pupils equal, round and reactive to light, visual fields intact, fundi unremarkable. CN III, IV, VI:  full range of motion, no nystagmus, no ptosis CN V: facial sensation intact CN VII: upper and lower face symmetric CN VIII: hearing intact to finger rub CN IX, X: gag intact, uvula midline CN XI: sternocleidomastoid and trapezius muscles intact CN XII: tongue midline Bulk & Tone: normal, no fasciculations. Motor: 5/5 throughout with no pronator drift. Sensation: intact to light touch, cold, pin, vibration and joint position sense.  No extinction to double simultaneous stimulation.  Romberg test negative Deep Tendon Reflexes: +2 throughout, no ankle clonus Plantar responses: downgoing bilaterally Cerebellar: no incoordination on finger to nose testing Gait: hunched, wide-based, slightly unsteady, can ambulate without walker Tremor: none  IMPRESSION: This is a pleasant 76 year old woman with a history of  hypertension, hyperlipidemia, autism spectrum disorder, deaf, presenting after hospitalization from May 15-22, 2018 for left temporal lobe hemorrhage and left subdural  hematoma. Bleed was felt likely to be traumatic, unclear exact etiology. She has significantly improved since then, however continues to have some cognitive changes from baseline. Family/staff also reporting brief staring spells a couple of times a week. A repeat head CT without contrast will be ordered for interval follow-up, routine EEG as well. We may be able to taper off Keppra, depending on results, this may be contributing to cognitive changes. Continue with close supervision, staff is asking for direction on how much care, she likely mostly needs supervision in the daytime with walking and getting around, otherwise she appears to be doing much better. We discussed natural history and prognosis after brain bleed. She will follow-up in 4 months and knows to call for any changes.   Thank you  for allowing me to participate in the care of this patient. Please do not hesitate to call for any questions or concerns.   Patrcia Dolly, M.D.  CC: Dr. [name]

## 2017-06-03 NOTE — Patient Instructions (Addendum)
1. Schedule head CT without contrast  We have [facility] a [facility] and they will call you directly to schedule your appt. They are located at [address] If you need to contact them directly please call [phone].  2. Schedule routine EEG 3. Depending on results, we may plan to taper off the Keppra 4. Follow-up in 4 months, call for any changes

## 2017-06-04 DIAGNOSIS — H913 Deaf nonspeaking, not elsewhere classified: Secondary | ICD-10-CM | POA: Diagnosis not present

## 2017-06-04 DIAGNOSIS — I10 Essential (primary) hypertension: Secondary | ICD-10-CM | POA: Diagnosis not present

## 2017-06-04 DIAGNOSIS — I6200 Nontraumatic subdural hemorrhage, unspecified: Secondary | ICD-10-CM | POA: Diagnosis not present

## 2017-06-04 DIAGNOSIS — R413 Other amnesia: Secondary | ICD-10-CM | POA: Diagnosis not present

## 2017-06-08 ENCOUNTER — Ambulatory Visit: Payer: Medicare Other | Admitting: Podiatry

## 2017-06-08 DIAGNOSIS — I6200 Nontraumatic subdural hemorrhage, unspecified: Secondary | ICD-10-CM | POA: Diagnosis not present

## 2017-06-08 DIAGNOSIS — R413 Other amnesia: Secondary | ICD-10-CM | POA: Diagnosis not present

## 2017-06-08 DIAGNOSIS — H913 Deaf nonspeaking, not elsewhere classified: Secondary | ICD-10-CM | POA: Diagnosis not present

## 2017-06-08 DIAGNOSIS — I10 Essential (primary) hypertension: Secondary | ICD-10-CM | POA: Diagnosis not present

## 2017-06-10 DIAGNOSIS — I10 Essential (primary) hypertension: Secondary | ICD-10-CM | POA: Diagnosis not present

## 2017-06-10 DIAGNOSIS — I6200 Nontraumatic subdural hemorrhage, unspecified: Secondary | ICD-10-CM | POA: Diagnosis not present

## 2017-06-10 DIAGNOSIS — R413 Other amnesia: Secondary | ICD-10-CM | POA: Diagnosis not present

## 2017-06-10 DIAGNOSIS — H913 Deaf nonspeaking, not elsewhere classified: Secondary | ICD-10-CM | POA: Diagnosis not present

## 2017-06-14 ENCOUNTER — Ambulatory Visit
Admission: RE | Admit: 2017-06-14 | Discharge: 2017-06-14 | Disposition: A | Discharge status: Home / Self Care | Payer: Medicare Other | Source: Ambulatory Visit | Attending: Neurology | Admitting: Neurology

## 2017-06-14 ENCOUNTER — Telehealth: Payer: Self-pay | Admitting: Neurology

## 2017-06-14 ENCOUNTER — Ambulatory Visit (INDEPENDENT_AMBULATORY_CARE_PROVIDER_SITE_OTHER): Payer: Medicare Other | Admitting: Neurology

## 2017-06-14 DIAGNOSIS — R4189 Other symptoms and signs involving cognitive functions and awareness: Secondary | ICD-10-CM

## 2017-06-14 DIAGNOSIS — S065XAA Traumatic subdural hemorrhage with loss of consciousness status unknown, initial encounter: Secondary | ICD-10-CM

## 2017-06-14 DIAGNOSIS — S065X9A Traumatic subdural hemorrhage with loss of consciousness of unspecified duration, initial encounter: Secondary | ICD-10-CM

## 2017-06-14 DIAGNOSIS — I6200 Nontraumatic subdural hemorrhage, unspecified: Secondary | ICD-10-CM | POA: Diagnosis not present

## 2017-06-14 DIAGNOSIS — S0990XA Unspecified injury of head, initial encounter: Secondary | ICD-10-CM | POA: Diagnosis not present

## 2017-06-14 NOTE — Telephone Encounter (Signed)
[facility] POA requested that she be called with the results from her EEG today. Thanks

## 2017-06-14 NOTE — Telephone Encounter (Signed)
When will call when results are ready. Patient had EEG today at 1:00 pm.

## 2017-06-15 NOTE — Procedures (Signed)
ELECTROENCEPHALOGRAM REPORT  Date of Study: 06/14/2017  Patient's Name: Laura [name] M Rody MRN: [id number] Date of Birth: [birth year]  Referring Provider: Dr. Patrcia DollyKaren Aquino  Clinical History: This is a 76 year old woman with left temporal hemorrhage, left subdural hematoma, with cognitive changes. She has brief staring spells a couple of times a week.  Medications: Keppra FOSAMAX NORVASC VITAMIN D KLONOPIN SYNTHROID, LEVOTHROID LOPRESSOR MULTIVITAMIN vitamin E 100   Technical Summary: A multichannel digital EEG recording measured by the international 10-20 system with electrodes applied with paste and impedances below 5000 ohms performed in our laboratory with EKG monitoring in an awake patient.  Hyperventilation was not performed. Photic stimulation  Was performed.  The digital EEG was referentially recorded, reformatted, and digitally filtered in a variety of bipolar and referential montages for optimal display.    Description: The patient is awake during the recording.  During maximal wakefulness, there is a asymmetric, medium voltage 10 Hz posterior dominant rhythm better formed over the right occipital region, that attenuates with eye opening.  There is focal theta and delta slowing over the left temporal region. There is an excess amount of diffuse low voltage beta activity seen throughout the recording. Sleep was not captured. photic stimulation did not elicit any abnormalities.  There were no epileptiform discharges or electrographic seizures seen.    EKG lead was unremarkable.  Impression: This awake EEG is abnormal due to the presence of: 1. occasional focal slowing over the left temporal region 2. excess amount of diffuse low voltage beta activity  Clinical Correlation of the above findings indicates focal cerebral dysfunction over the left temporal region suggestive of underlying structural or physiologic abnormality. Diffuse low voltage beta activity is commonly seen with  sedating medications such as benzodiazepines.  In the absence of sedating medications, anxiety and hyperthyroidism may produce generalized beta activity.  The absence of epileptiform discharges does not exclude a clinical diagnosis of epilepsy.  If further clinical questions remain, prolonged EEG may be helpful.  Clinical correlation is advised.   Patrcia DollyKaren Aquino, M.D.

## 2017-06-16 ENCOUNTER — Other Ambulatory Visit: Payer: Self-pay | Admitting: Endocrinology

## 2017-06-16 ENCOUNTER — Telehealth: Payer: Self-pay | Admitting: Neurology

## 2017-06-16 DIAGNOSIS — I6200 Nontraumatic subdural hemorrhage, unspecified: Secondary | ICD-10-CM | POA: Diagnosis not present

## 2017-06-16 DIAGNOSIS — I10 Essential (primary) hypertension: Secondary | ICD-10-CM | POA: Diagnosis not present

## 2017-06-16 DIAGNOSIS — R413 Other amnesia: Secondary | ICD-10-CM | POA: Diagnosis not present

## 2017-06-16 DIAGNOSIS — H913 Deaf nonspeaking, not elsewhere classified: Secondary | ICD-10-CM | POA: Diagnosis not present

## 2017-06-16 NOTE — Telephone Encounter (Signed)
Spoke to her medical POA Darl PikesSusan, discussed head CT and EEG results. She has been making tremendous progress but gets stuck trying to remember, but gets it back much quickly than before. They have backed off on night aide, she still has an aide all day. She is not staring as much, and if she does, it is very brief. Discussed trying to reduce Keppra 500mg  to 1/2 tab in AM, 1 tab in PM and see how she does cognitively. Monitor for staring spells, if they increase, would go back up. Discussed temporal lobe encephalomalacia could be a potential seizure focus, but if no clinical symptoms, potentially we can taper off. Darl PikesSusan will continue to monitor and update for any changes. She also mentioned MedicAlert button would not be a good option for patient because she does not think she would keep it on (does not keep hearing aids on), and would not understand how to use it. She plans to install a camera instead to keep an eye on her during daytime.

## 2017-06-18 DIAGNOSIS — I6200 Nontraumatic subdural hemorrhage, unspecified: Secondary | ICD-10-CM | POA: Diagnosis not present

## 2017-06-18 DIAGNOSIS — H913 Deaf nonspeaking, not elsewhere classified: Secondary | ICD-10-CM | POA: Diagnosis not present

## 2017-06-18 DIAGNOSIS — R413 Other amnesia: Secondary | ICD-10-CM | POA: Diagnosis not present

## 2017-06-18 DIAGNOSIS — I10 Essential (primary) hypertension: Secondary | ICD-10-CM | POA: Diagnosis not present

## 2017-06-22 DIAGNOSIS — Z961 Presence of intraocular lens: Secondary | ICD-10-CM | POA: Diagnosis not present

## 2017-06-23 ENCOUNTER — Telehealth: Payer: Self-pay | Admitting: Endocrinology

## 2017-06-23 DIAGNOSIS — H913 Deaf nonspeaking, not elsewhere classified: Secondary | ICD-10-CM | POA: Diagnosis not present

## 2017-06-23 DIAGNOSIS — I6200 Nontraumatic subdural hemorrhage, unspecified: Secondary | ICD-10-CM | POA: Diagnosis not present

## 2017-06-23 DIAGNOSIS — I10 Essential (primary) hypertension: Secondary | ICD-10-CM | POA: Diagnosis not present

## 2017-06-23 DIAGNOSIS — R413 Other amnesia: Secondary | ICD-10-CM | POA: Diagnosis not present

## 2017-06-23 NOTE — Telephone Encounter (Signed)
**  Remind patient they can make refill requests via MyChart**  Medication refill request (Name & Dosage): alendronate (FOSAMAX) 70 MG tablet   Preferred pharmacy (Name & Address): [facility] [address] - [place], [place] - [address] [phone] (Phone) [phone] (Fax)      Other comments (if applicable):

## 2017-06-24 ENCOUNTER — Ambulatory Visit (INDEPENDENT_AMBULATORY_CARE_PROVIDER_SITE_OTHER): Payer: Medicare Other | Admitting: Podiatry

## 2017-06-24 DIAGNOSIS — Q828 Other specified congenital malformations of skin: Secondary | ICD-10-CM

## 2017-06-24 DIAGNOSIS — M79676 Pain in unspecified toe(s): Secondary | ICD-10-CM

## 2017-06-24 DIAGNOSIS — B351 Tinea unguium: Secondary | ICD-10-CM

## 2017-06-24 NOTE — Progress Notes (Signed)
She presents today with chief complaint of painful elongated toenails and multiple calluses.  Objective: Vital signs are stable she was oriented 3. Pulses are palpable. Long thick yellow dystrophic clinic mycotic nails with multiple distal clavi.  Assessment: Pain in limb secondary to onychomycosis and porokeratosis.  Plan: Debridement of all porokeratotic tissue debridement of toenails 1 through 5 bilateral. Follow up with her in 3 months

## 2017-06-25 DIAGNOSIS — I10 Essential (primary) hypertension: Secondary | ICD-10-CM | POA: Diagnosis not present

## 2017-06-25 DIAGNOSIS — H913 Deaf nonspeaking, not elsewhere classified: Secondary | ICD-10-CM | POA: Diagnosis not present

## 2017-06-25 DIAGNOSIS — I6200 Nontraumatic subdural hemorrhage, unspecified: Secondary | ICD-10-CM | POA: Diagnosis not present

## 2017-06-25 DIAGNOSIS — R413 Other amnesia: Secondary | ICD-10-CM | POA: Diagnosis not present

## 2017-07-01 DIAGNOSIS — M1812 Unilateral primary osteoarthritis of first carpometacarpal joint, left hand: Secondary | ICD-10-CM | POA: Diagnosis not present

## 2017-07-19 ENCOUNTER — Ambulatory Visit: Payer: Self-pay | Admitting: Endocrinology

## 2017-08-09 ENCOUNTER — Other Ambulatory Visit: Payer: Self-pay | Admitting: Endocrinology

## 2017-08-21 DIAGNOSIS — Z23 Encounter for immunization: Secondary | ICD-10-CM | POA: Diagnosis not present

## 2017-08-26 ENCOUNTER — Ambulatory Visit: Payer: Self-pay | Admitting: Neurology

## 2017-09-14 ENCOUNTER — Telehealth: Payer: Self-pay | Admitting: Endocrinology

## 2017-09-14 NOTE — Telephone Encounter (Signed)
Patient need a refill of clonazepam send to  [facility]  [address]

## 2017-09-15 ENCOUNTER — Other Ambulatory Visit: Payer: Self-pay

## 2017-09-15 MED ORDER — CLONAZEPAM 0.5 MG PO TABS: 0.5000 mg | ORAL_TABLET | Freq: Two times a day (BID) | ORAL | 0 refills | Status: DC | PRN

## 2017-09-15 NOTE — Telephone Encounter (Signed)
I have printed & will fax.

## 2017-09-16 NOTE — Telephone Encounter (Signed)
We have tried everything.  This is the best we can do.

## 2017-09-16 NOTE — Telephone Encounter (Signed)
Called and spoke with patient's healthcare POA. I stated that Dr. Everardo AllEllison felt the lozazapam was the best he could do & that the prescription was faxed this morning.

## 2017-09-16 NOTE — Telephone Encounter (Signed)
I have faxed prescription to pharmacy. Please advise on legs shaking?

## 2017-09-16 NOTE — Telephone Encounter (Signed)
Pt states the lorazapam is not working to stop her legs from shaking what can she do? [facility] does not [facility] the [facility] can we resend it for her? Please call the pharmacy to gain clarity about this order.

## 2017-09-27 ENCOUNTER — Ambulatory Visit: Payer: Self-pay | Admitting: Neurology

## 2017-09-28 ENCOUNTER — Ambulatory Visit (INDEPENDENT_AMBULATORY_CARE_PROVIDER_SITE_OTHER): Payer: Medicare Other | Admitting: Endocrinology

## 2017-09-28 ENCOUNTER — Other Ambulatory Visit: Payer: Self-pay | Admitting: Endocrinology

## 2017-09-28 ENCOUNTER — Encounter: Payer: Self-pay | Admitting: Endocrinology

## 2017-09-28 VITALS — BP 142/82 | HR 73 | Wt 118.4 lb

## 2017-09-28 DIAGNOSIS — I10 Essential (primary) hypertension: Secondary | ICD-10-CM

## 2017-09-28 DIAGNOSIS — R739 Hyperglycemia, unspecified: Secondary | ICD-10-CM

## 2017-09-28 DIAGNOSIS — E039 Hypothyroidism, unspecified: Secondary | ICD-10-CM | POA: Diagnosis not present

## 2017-09-28 LAB — BASIC METABOLIC PANEL
BUN: 18 mg/dL (ref 6–23)
CALCIUM: 9.4 mg/dL (ref 8.4–10.5)
CHLORIDE: 99 meq/L (ref 96–112)
CO2: 32 meq/L (ref 19–32)
Creatinine, Ser: 0.78 mg/dL (ref 0.40–1.20)
GFR: 76.62 mL/min (ref >60.00)
GLUCOSE: 122 mg/dL — AB (ref 70–99)
POTASSIUM: 4.3 meq/L (ref 3.5–5.1)
SODIUM: 135 meq/L (ref 135–145)

## 2017-09-28 LAB — T4, FREE: Free T4: 0.76 ng/dL (ref 0.60–1.60)

## 2017-09-28 LAB — TSH: TSH: 4.41 u[IU]/mL (ref 0.35–4.50)

## 2017-09-28 LAB — POCT GLYCOSYLATED HEMOGLOBIN (HGB A1C): HEMOGLOBIN A1C: 6.2

## 2017-09-28 MED ORDER — CLONAZEPAM 0.5 MG PO TABS: 0.5000 mg | ORAL_TABLET | Freq: Four times a day (QID) | ORAL | 5 refills | Status: DC | PRN

## 2017-09-28 NOTE — Patient Instructions (Addendum)
Please increase the clonazepam to 4 times a day, as needed.  Here is a new prescription.   Please continue the same other medications.   blood tests are requested for you today.  We'll let you know about the results. Please come back for a follow-up appointment in 3 months.

## 2017-09-28 NOTE — Progress Notes (Signed)
Subjective:    Patient ID: Laura [name], female    DOB: 10/03/1941, 76 y.o.   MRN: [id number]  HPI  Pt returns for f/u of diabetes mellitus: DM type: 2 Dx'ed: 2016 Complications: none Therapy:no medication now GDM: never DKA: never Severe hypoglycemia: never Pancreatitis: never Pancreatic imaging: never Other: she has never been on insulin Interval history: tremor is pt's CC, despite increasing clonazepam to TID.  pt confirms lopressor is 1/2 tab bid.     Past Medical History:  Diagnosis Date  . Anxiety state, unspecified 10/10/2007  . Arthritis   . ASYMPTOMATIC POSTMENOPAUSAL STATUS 04/15/2009  . Cataract    removed both eyes   . Deaf   . Dermatitis   . Headache(784.0) 08/27/2008  . HYPERLIPIDEMIA 06/22/2007   rx refused  . HYPERTENSION 05/07/2008  . HYPOTHYROIDISM 04/15/2009  . Mild mental retardation   . 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 RESTLESS LEG SYNDROME 10/01/2008  . TACHYCARDIA 08/27/2008  . TREMOR 11/21/2007  . TRIGGER FINGER 10/30/2009    Past Surgical History:  Procedure Laterality Date  . CATARACT EXTRACTION, BILATERAL    . COLONOSCOPY      Social History   Socioeconomic History  . Marital status: Single    Spouse name: Not on file  . Number of children: Not on file  . Years of education: Not on file  . Highest education level: Not on file  Social Needs  . Financial resource strain: Not on file  . Food insecurity - worry: Not on file  . Food insecurity - inability: Not on file  . Transportation needs - medical: Not on file  . Transportation needs - non-medical: Not on file  Occupational History  . Occupation: Disabled    Employer: UNEMPLOYED  Tobacco Use  . Smoking status: Never Smoker  . Smokeless tobacco: Never Used  Substance and Sexual Activity  . Alcohol use: No  . Drug use: No  . Sexual activity: Not on file  Other Topics Concern  . Not on file  Social History Narrative   Lives alone   Never married   Physical activity  is very good   She has never had a driver's license   Does not have a phone at home     Current Outpatient Medications on File Prior to Visit  Medication Sig Dispense Refill  . alendronate (FOSAMAX) 70 MG tablet Take 1 tablet (70 mg total) by mouth once a week. Take with a full glass of water on an empty stomach. 4 tablet 11  . amLODipine (NORVASC) 10 MG tablet Take 1 tablet (10 mg total) by mouth daily.    . cholecalciferol (VITAMIN D) 1000 UNITS tablet Take 1,000 Units by mouth daily.    Marland Kitchen. levETIRAcetam (KEPPRA) 500 MG tablet Take 1 tablet (500 mg total) by mouth 2 (two) times daily.    Marland Kitchen. levothyroxine (SYNTHROID, LEVOTHROID) 50 MCG tablet TAKE 1 TABLET BY MOUTH ONCE DAILY 90 tablet 1  . Multiple Vitamin (MULTIVITAMIN) tablet Take 1 tablet by mouth daily.      Marland Kitchen. triamcinolone cream (KENALOG) 0.1 % APPLY  CREAM EXTERNALLY THREE TIMES DAILY AS NEEDED FOR ITCHING 80 g 2  . vitamin E 100 UNIT capsule Take 100 Units by mouth 2 (two) times daily.       No current facility-administered medications on file prior to visit.     No Known Allergies  Family History  Problem Relation Age of Onset  . Thyroid disease Neg Hx   .  Colon cancer Neg Hx   . Colon polyps Neg Hx   . Rectal cancer Neg Hx   . Stomach cancer Neg Hx     BP (!) 142/82 (BP Location: Left Arm, Patient Position: Sitting, Cuff Size: Normal)   Pulse 73   Wt 118 lb 6.4 oz (53.7 kg)   SpO2 97%   BMI 22.74 kg/m   Review of Systems Denies sob.    Objective:   Physical Exam VITAL SIGNS:  See vs page GENERAL: no distress Pulses: foot pulses are intact bilaterally.   MSK: no deformity of the feet or ankles.  CV: no edema of the legs or ankles Skin:  no ulcer on the feet or ankles.  normal color and temp on the feet and ankles Neuro: sensation is intact to touch on the feet and ankles.      Lab Results  Component Value Date   HGBA1C 6.2 09/28/2017      Assessment & Plan:  Type 2 DM: no medication is needed  now. Tremor: she needs increased rx. Hyponatremia: due for recheck.   Patient Instructions  Please increase the clonazepam to 4 times a day, as needed.  Here is a new prescription.   Please continue the same other medications.   blood tests are requested for you today.  We'll let you know about the results. Please come back for a follow-up appointment in 3 months.

## 2017-09-29 ENCOUNTER — Ambulatory Visit (INDEPENDENT_AMBULATORY_CARE_PROVIDER_SITE_OTHER): Payer: Medicare Other | Admitting: Podiatry

## 2017-09-29 ENCOUNTER — Encounter: Payer: Self-pay | Admitting: Podiatry

## 2017-09-29 ENCOUNTER — Encounter: Payer: Self-pay | Admitting: Endocrinology

## 2017-09-29 DIAGNOSIS — B351 Tinea unguium: Secondary | ICD-10-CM | POA: Diagnosis not present

## 2017-09-29 DIAGNOSIS — M79676 Pain in unspecified toe(s): Secondary | ICD-10-CM | POA: Diagnosis not present

## 2017-09-29 NOTE — Progress Notes (Signed)
Complaint:  Visit Type: Patient returns to my office for continued preventative foot care services. Complaint: Patient states" my nails have grown long and thick and become painful to walk and wear shoes" . The patient presents for preventative foot care services. No changes to ROS  Podiatric Exam: Vascular: dorsalis pedis and posterior tibial pulses are palpable bilateral. Capillary return is immediate. Temperature gradient is WNL. Skin turgor WNL  Sensorium: Normal Semmes Weinstein monofilament test. Normal tactile sensation bilaterally. Nail Exam: Pt has thick disfigured discolored nails with subungual debris noted bilateral entire nail hallux through fifth toenails Ulcer Exam: There is no evidence of ulcer or pre-ulcerative changes or infection. Orthopedic Exam: Muscle tone and strength are WNL. No limitations in general ROM. No crepitus or effusions noted. Foot type and digits show no abnormalities. Bony prominences are unremarkable. Skin: No Porokeratosis. No infection or ulcers  Diagnosis:  Onychomycosis, , Pain in right toe, pain in left toes  Treatment & Plan Procedures and Treatment: Consent by patient was obtained for treatment procedures.   Debridement of mycotic and hypertrophic toenails, 1 through 5 bilateral and clearing of subungual debris. No ulceration, no infection noted.  Return Visit-Office Procedure: Patient instructed to return to the office for a follow up visit 3 months for continued evaluation and treatment.    Barbette Mcglaun DPM 

## 2017-09-30 ENCOUNTER — Ambulatory Visit: Payer: Medicare Other | Admitting: Podiatry

## 2017-09-30 DIAGNOSIS — M1812 Unilateral primary osteoarthritis of first carpometacarpal joint, left hand: Secondary | ICD-10-CM | POA: Diagnosis not present

## 2017-10-04 ENCOUNTER — Telehealth: Payer: Self-pay

## 2017-10-04 NOTE — Telephone Encounter (Signed)
Ok, Please come back for a follow-up appointment in 1 month.

## 2017-10-04 NOTE — Telephone Encounter (Signed)
Patient called today and she feels she is improving with medication but at night time she gets the shakes really bad this happens around midnight and lasts all night long- in the morning she eats breakfast and takes meds goes for a walk and she starts to not feel shaky around 10 am when she is done walking- wants to know if she needs to be seen again before February- she is concerned that this is not normal and wants to know if she should com in for an appointment sooner please advise- patient will need to go through interpretor when the nurse calls back to give advice

## 2017-10-06 NOTE — Telephone Encounter (Signed)
Called and spoke with patient's interpreter but she was not with patient. She stated that she would see her tomorrow night & call back to make 1 month appt. I have canceled her f/u for February.

## 2017-10-26 ENCOUNTER — Telehealth: Payer: Self-pay | Admitting: Endocrinology

## 2017-10-26 ENCOUNTER — Other Ambulatory Visit: Payer: Self-pay

## 2017-10-26 MED ORDER — CLONAZEPAM 0.5 MG PO TABS: 0.5000 mg | ORAL_TABLET | Freq: Four times a day (QID) | ORAL | 5 refills | Status: DC | PRN

## 2017-10-26 MED ORDER — LEVOTHYROXINE SODIUM 50 MCG PO TABS: 50.0000 ug | ORAL_TABLET | Freq: Every day | ORAL | 1 refills | Status: DC

## 2017-10-26 MED ORDER — METOPROLOL TARTRATE 25 MG PO TABS: ORAL_TABLET | ORAL | 2 refills | Status: DC

## 2017-10-26 NOTE — Telephone Encounter (Signed)
Need refills metoprolol tartrate (LOPRESSOR) 25 MG tablet [[id number]][[id number]], levothyroxine (SYNTHROID, LEVOTHROID) 50 MCG tablet [[id number]][[id number]]; clonazePAM (KLONOPIN) 0.5 MG tablet [[id number]][[id number]]    Send to  [place], [place] - [address] DEA #:  --    Pharmacy Comments:  --

## 2017-10-26 NOTE — Telephone Encounter (Signed)
Done

## 2017-11-08 NOTE — Telephone Encounter (Signed)
Patient stated that the medication clonazePAM (KLONOPIN) 0.5 MG tablet [[id number]][[id number]]  Is making her shaky. Please advise

## 2017-11-08 NOTE — Telephone Encounter (Signed)
See message and please advise, Thanks!  

## 2017-11-08 NOTE — Telephone Encounter (Signed)
This medication is supposed to help the shaking.  This is the best you can do.

## 2017-11-09 NOTE — Telephone Encounter (Signed)
I called and left VM of contact number that stated the klonopin was prescribed to help with the shaking. I stated that she could call back with questions or concerns.

## 2017-11-17 ENCOUNTER — Other Ambulatory Visit: Payer: Self-pay

## 2017-11-17 MED ORDER — METOPROLOL TARTRATE 25 MG PO TABS: ORAL_TABLET | ORAL | 2 refills | Status: DC

## 2017-11-17 MED ORDER — LEVOTHYROXINE SODIUM 50 MCG PO TABS: 50.0000 ug | ORAL_TABLET | Freq: Every day | ORAL | 1 refills | Status: DC

## 2017-11-17 NOTE — Telephone Encounter (Signed)
Sorry, we are doing the best we can with current meds.

## 2017-11-17 NOTE — Telephone Encounter (Signed)
Pt states her legs are still shaky and nothing is helping.

## 2017-11-17 NOTE — Telephone Encounter (Signed)
Patient called & there was apparent confusion. She just needed refills on levothyroxine & metoprolol.

## 2017-11-29 ENCOUNTER — Telehealth: Payer: Self-pay | Admitting: Endocrinology

## 2017-11-29 NOTE — Telephone Encounter (Signed)
Ok to take

## 2017-11-29 NOTE — Telephone Encounter (Signed)
Patient stated her legs are shaky and has to keep them up at all times she is taking clonazepam, she was tod she can take magnesium for legs shaking and would like to know if she can purchase. Please advise

## 2017-11-29 NOTE — Telephone Encounter (Signed)
I called and spoke with patient's interpreter stating that it was fine to purchase the OTC magnesium.

## 2017-11-30 ENCOUNTER — Telehealth: Payer: Self-pay | Admitting: Endocrinology

## 2017-11-30 NOTE — Telephone Encounter (Signed)
OK 

## 2017-11-30 NOTE — Telephone Encounter (Signed)
Patient wants to know if she can take vitamin supplements (Naturemade Calcium and Zinc) for her anxiety and nervousness? Please call patient at ph# [phone] to advise

## 2017-12-01 NOTE — Telephone Encounter (Signed)
I called and left patient a detailed VM that it was ok to take the OTC vitamin supplements.

## 2017-12-27 ENCOUNTER — Other Ambulatory Visit: Payer: Self-pay | Admitting: Endocrinology

## 2017-12-27 ENCOUNTER — Other Ambulatory Visit: Payer: Self-pay

## 2017-12-27 MED ORDER — CLONAZEPAM 0.5 MG PO TABS: 0.5000 mg | ORAL_TABLET | Freq: Four times a day (QID) | ORAL | 5 refills | Status: DC | PRN

## 2017-12-29 ENCOUNTER — Ambulatory Visit (INDEPENDENT_AMBULATORY_CARE_PROVIDER_SITE_OTHER): Payer: Medicare Other | Admitting: Endocrinology

## 2017-12-29 ENCOUNTER — Encounter: Payer: Self-pay | Admitting: Endocrinology

## 2017-12-29 ENCOUNTER — Ambulatory Visit: Payer: Self-pay | Admitting: Endocrinology

## 2017-12-29 VITALS — BP 142/82 | HR 88 | Ht 60.5 in | Wt 123.2 lb

## 2017-12-29 DIAGNOSIS — R739 Hyperglycemia, unspecified: Secondary | ICD-10-CM | POA: Diagnosis not present

## 2017-12-29 DIAGNOSIS — R251 Tremor, unspecified: Secondary | ICD-10-CM

## 2017-12-29 DIAGNOSIS — I10 Essential (primary) hypertension: Secondary | ICD-10-CM

## 2017-12-29 LAB — MICROALBUMIN / CREATININE URINE RATIO
Creatinine,U: 38.7 mg/dL
Microalb Creat Ratio: 1.8 mg/g (ref 0.0–30.0)

## 2017-12-29 NOTE — Patient Instructions (Addendum)
Please continue the same medication for the shaking.  We are doing our best.  A urine test is requested for you today.  We'll let you know about the results.  Please continue the same medication for the blood pressure.  Please come back for a follow-up appointment in 3 months.

## 2017-12-29 NOTE — Progress Notes (Signed)
Subjective:    Patient ID: Laura [name], female    DOB: 1941/06/02, 77 y.o.   MRN: [id number]  HPI Tremor persists. HTN: she takes norvasc as rx'ed.  She denies sob Past Medical History:  Diagnosis Date  . Anxiety state, unspecified 10/10/2007  . Arthritis   . ASYMPTOMATIC POSTMENOPAUSAL STATUS 04/15/2009  . Cataract    removed both eyes   . Deaf   . Dermatitis   . Headache(784.0) 08/27/2008  . HYPERLIPIDEMIA 06/22/2007   rx refused  . HYPERTENSION 05/07/2008  . HYPOTHYROIDISM 04/15/2009  . Mild mental retardation   . 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 RESTLESS LEG SYNDROME 10/01/2008  . TACHYCARDIA 08/27/2008  . TREMOR 11/21/2007  . TRIGGER FINGER 10/30/2009    Past Surgical History:  Procedure Laterality Date  . CATARACT EXTRACTION, BILATERAL    . COLONOSCOPY      Social History   Socioeconomic History  . Marital status: Single    Spouse name: Not on file  . Number of children: Not on file  . Years of education: Not on file  . Highest education level: Not on file  Social Needs  . Financial resource strain: Not on file  . Food insecurity - worry: Not on file  . Food insecurity - inability: Not on file  . Transportation needs - medical: Not on file  . Transportation needs - non-medical: Not on file  Occupational History  . Occupation: Disabled    Employer: UNEMPLOYED  Tobacco Use  . Smoking status: Never Smoker  . Smokeless tobacco: Never Used  Substance and Sexual Activity  . Alcohol use: No  . Drug use: No  . Sexual activity: Not on file  Other Topics Concern  . Not on file  Social History Narrative   Lives alone   Never married   Physical activity is very good   She has never had a driver's license   Does not have a phone at home     Current Outpatient Medications on File Prior to Visit  Medication Sig Dispense Refill  . alendronate (FOSAMAX) 70 MG tablet Take 1 tablet (70 mg total) by mouth once a week. Take with a full glass of water on  an empty stomach. 4 tablet 11  . amLODipine (NORVASC) 10 MG tablet Take 1 tablet (10 mg total) by mouth daily.    . cholecalciferol (VITAMIN D) 1000 UNITS tablet Take 1,000 Units by mouth daily.    . clonazePAM (KLONOPIN) 0.5 MG tablet Take 1 tablet (0.5 mg total) by mouth 4 (four) times daily as needed (shaking). 120 tablet 5  . levETIRAcetam (KEPPRA) 500 MG tablet Take 1 tablet (500 mg total) by mouth 2 (two) times daily.    Marland Kitchen. levothyroxine (SYNTHROID, LEVOTHROID) 50 MCG tablet Take 1 tablet (50 mcg total) by mouth daily. 90 tablet 1  . metoprolol tartrate (LOPRESSOR) 25 MG tablet TAKE 1/2 (ONE-HALF) TABLET BY MOUTH TWICE DAILY 30 tablet 2  . Multiple Vitamin (MULTIVITAMIN) tablet Take 1 tablet by mouth daily.      Marland Kitchen. triamcinolone cream (KENALOG) 0.1 % APPLY  CREAM EXTERNALLY THREE TIMES DAILY AS NEEDED FOR ITCHING 80 g 2   No current facility-administered medications on file prior to visit.     No Known Allergies  Family History  Problem Relation Age of Onset  . Thyroid disease Neg Hx   . Colon cancer Neg Hx   . Colon polyps Neg Hx   . Rectal cancer Neg Hx   .  Stomach cancer Neg Hx     BP (!) 142/82   Pulse 88   Ht 5' 0.5" (1.537 m)   Wt 123 lb 3.2 oz (55.9 kg)   BMI 23.66 kg/m    Review of Systems Denies chest pain and falls.     Objective:   Physical Exam VITAL SIGNS:  See vs page.  GENERAL: no distress.  Ext; trace bilat leg edema.  Gait: normal and steady.   Lab Results  Component Value Date   TSH 4.41 09/28/2017      Assessment & Plan:  Tremor, chronic, due to anxiety. HTN: wit poss situational component.   Patient Instructions  Please continue the same medication for the shaking.  We are doing our best.  A urine test is requested for you today.  We'll let you know about the results.  Please continue the same medication for the blood pressure.  Please come back for a follow-up appointment in 3 months.

## 2017-12-30 ENCOUNTER — Encounter: Payer: Self-pay | Admitting: Endocrinology

## 2018-01-04 ENCOUNTER — Ambulatory Visit: Payer: Medicare Other

## 2018-01-12 ENCOUNTER — Encounter: Payer: Self-pay | Admitting: Podiatry

## 2018-01-12 ENCOUNTER — Ambulatory Visit (INDEPENDENT_AMBULATORY_CARE_PROVIDER_SITE_OTHER): Payer: Medicare Other | Admitting: Podiatry

## 2018-01-12 DIAGNOSIS — B351 Tinea unguium: Secondary | ICD-10-CM

## 2018-01-12 DIAGNOSIS — M79676 Pain in unspecified toe(s): Secondary | ICD-10-CM | POA: Diagnosis not present

## 2018-01-12 NOTE — Progress Notes (Signed)
Complaint:  Visit Type: Patient returns to my office for continued preventative foot care services. Complaint: Patient states" my nails have grown long and thick and become painful to walk and wear shoes" . The patient presents for preventative foot care services. No changes to ROS  Podiatric Exam: Vascular: dorsalis pedis and posterior tibial pulses are palpable bilateral. Capillary return is immediate. Temperature gradient is WNL. Skin turgor WNL  Sensorium: Normal Semmes Weinstein monofilament test. Normal tactile sensation bilaterally. Nail Exam: Pt has thick disfigured discolored nails with subungual debris noted bilateral entire nail hallux through fifth toenails Ulcer Exam: There is no evidence of ulcer or pre-ulcerative changes or infection. Orthopedic Exam: Muscle tone and strength are WNL. No limitations in general ROM. No crepitus or effusions noted. Foot type and digits show no abnormalities. Bony prominences are unremarkable. Skin: No Porokeratosis. No infection or ulcers  Diagnosis:  Onychomycosis, , Pain in right toe, pain in left toes  Treatment & Plan Procedures and Treatment: Consent by patient was obtained for treatment procedures.   Debridement of mycotic and hypertrophic toenails, 1 through 5 bilateral and clearing of subungual debris. No ulceration, no infection noted.  Return Visit-Office Procedure: Patient instructed to return to the office for a follow up visit 3 months for continued evaluation and treatment.    Shontez Sermon DPM 

## 2018-01-27 ENCOUNTER — Other Ambulatory Visit: Payer: Self-pay

## 2018-02-01 ENCOUNTER — Telehealth: Payer: Self-pay | Admitting: Endocrinology

## 2018-02-02 NOTE — Telephone Encounter (Signed)
Patient called about her rx for Clonazpam.  She has run out of refills and only has 1 pill left.  Patient needs a new rx. [facility]

## 2018-02-02 NOTE — Telephone Encounter (Signed)
[facility].  It has several refills left.

## 2018-02-03 ENCOUNTER — Other Ambulatory Visit: Payer: Self-pay

## 2018-02-03 ENCOUNTER — Telehealth: Payer: Self-pay | Admitting: Endocrinology

## 2018-02-03 NOTE — Telephone Encounter (Signed)
°  Patient is needing refills sent over Pharmacy states she has 0 refills.   clonazePAM (KLONOPIN) 0.5 MG tablet   [facility] [address] - [place], [place] - [address]

## 2018-02-03 NOTE — Telephone Encounter (Signed)
Left detailed VM that prescription was sent in last month with 5 refills left on it. I asked patient to have caregiver contact her pharmacy.

## 2018-02-03 NOTE — Telephone Encounter (Signed)
Left detailed VM that prescription was sent in last month with 5 refills left on it. I asked patient to have caregiver contact her pharmacy.  

## 2018-02-08 ENCOUNTER — Telehealth: Payer: Self-pay | Admitting: Endocrinology

## 2018-02-08 ENCOUNTER — Other Ambulatory Visit: Payer: Self-pay

## 2018-02-08 MED ORDER — METOPROLOL TARTRATE 25 MG PO TABS: ORAL_TABLET | ORAL | 2 refills | Status: DC

## 2018-02-08 MED ORDER — LEVOTHYROXINE SODIUM 50 MCG PO TABS: 50.0000 ug | ORAL_TABLET | Freq: Every day | ORAL | 1 refills | Status: DC

## 2018-02-08 NOTE — Telephone Encounter (Signed)
I have sent to patient;'s pharmacy.  

## 2018-02-08 NOTE — Telephone Encounter (Signed)
°  Patient is needing refill sent in for the following.  metoprolol tartrate (LOPRESSOR) 25 MG tablet  levothyroxine (SYNTHROID, LEVOTHROID) 50 MCG tablet   [facility] - [place], [place] - [address]

## 2018-03-08 ENCOUNTER — Telehealth: Payer: Self-pay | Admitting: Endocrinology

## 2018-03-08 NOTE — Telephone Encounter (Signed)
Patient stated she has been having tingling in her legs for 45 years and want to see doctor Everardo AllEllison.  She stated sh will wait until the appointment.  Called [facility]

## 2018-03-08 NOTE — Telephone Encounter (Signed)
Please keep scheduled appt.

## 2018-03-23 ENCOUNTER — Telehealth: Payer: Self-pay | Admitting: Endocrinology

## 2018-03-23 NOTE — Telephone Encounter (Signed)
I called but VM box is full. I will try to call back later on.

## 2018-03-23 NOTE — Telephone Encounter (Signed)
Please tell us what the new pill is.

## 2018-03-23 NOTE — Telephone Encounter (Signed)
clonazePAM (KLONOPIN) 0.5 MG tablet   Patient stated she does not like taking the above medication. She stated there is New pill.  She states the pill is yellow version of the above medication and it is a stronger dosage so she does not have to take medication so many times during the day.     [facility] [address] [place] - [address]

## 2018-03-24 ENCOUNTER — Telehealth: Payer: Self-pay | Admitting: Endocrinology

## 2018-03-24 NOTE — Telephone Encounter (Signed)
I don't think ythis is safe for her.  She has see 2 different specialists for this, and they both said there was nothing they could do.

## 2018-03-24 NOTE — Telephone Encounter (Signed)
Patient stated that medication  :  clonazePAM (KLONOPIN) 0.5 MG tablet [[id number]]  Is not working for her, she stated it was not strong enough,  She said the yellow pill works better. (The brand name)    Pharmacy:  [facility] [address] [place] - [address]     Please advised

## 2018-03-24 NOTE — Telephone Encounter (Signed)
Spoke with [name] her healthcare POA & stated that she is under the impression that she just wants a stronger dose of the klonopin.

## 2018-03-25 ENCOUNTER — Other Ambulatory Visit: Payer: Self-pay

## 2018-03-25 NOTE — Telephone Encounter (Signed)
I called and spoke with patient through interpreter line. She is going to see if she can refill the prescription she has for klonopin. She stated that she is still very shaky.

## 2018-03-25 NOTE — Telephone Encounter (Signed)
Best number [phone] Pt called requesting a refill

## 2018-03-25 NOTE — Telephone Encounter (Signed)
I called & LVM with patient to call back.

## 2018-03-28 ENCOUNTER — Ambulatory Visit: Payer: Self-pay | Admitting: Endocrinology

## 2018-03-28 DIAGNOSIS — Z0289 Encounter for other administrative examinations: Secondary | ICD-10-CM

## 2018-03-29 ENCOUNTER — Telehealth: Payer: Self-pay | Admitting: Endocrinology

## 2018-03-29 NOTE — Telephone Encounter (Signed)
error 

## 2018-04-13 ENCOUNTER — Ambulatory Visit (INDEPENDENT_AMBULATORY_CARE_PROVIDER_SITE_OTHER): Payer: Medicare Other | Admitting: Podiatry

## 2018-04-13 ENCOUNTER — Encounter: Payer: Self-pay | Admitting: Podiatry

## 2018-04-13 ENCOUNTER — Other Ambulatory Visit: Payer: Self-pay | Admitting: Endocrinology

## 2018-04-13 DIAGNOSIS — M79676 Pain in unspecified toe(s): Secondary | ICD-10-CM

## 2018-04-13 DIAGNOSIS — B351 Tinea unguium: Secondary | ICD-10-CM

## 2018-04-13 NOTE — Progress Notes (Signed)
Complaint:  Visit Type: Patient returns to my office for continued preventative foot care services. Complaint: Patient states" my nails have grown long and thick and become painful to walk and wear shoes" . The patient presents for preventative foot care services. No changes to ROS  Podiatric Exam: Vascular: dorsalis pedis and posterior tibial pulses are palpable bilateral. Capillary return is immediate. Temperature gradient is WNL. Skin turgor WNL  Sensorium: Normal Semmes Weinstein monofilament test. Normal tactile sensation bilaterally. Nail Exam: Pt has thick disfigured discolored nails with subungual debris noted bilateral entire nail hallux through fifth toenails Ulcer Exam: There is no evidence of ulcer or pre-ulcerative changes or infection. Orthopedic Exam: Muscle tone and strength are WNL. No limitations in general ROM. No crepitus or effusions noted. Foot type and digits show no abnormalities. Bony prominences are unremarkable. Skin: No Porokeratosis. No infection or ulcers  Diagnosis:  Onychomycosis, , Pain in right toe, pain in left toes  Treatment & Plan Procedures and Treatment: Consent by patient was obtained for treatment procedures.   Debridement of mycotic and hypertrophic toenails, 1 through 5 bilateral and clearing of subungual debris. No ulceration, no infection noted.  Return Visit-Office Procedure: Patient instructed to return to the office for a follow up visit 3 months for continued evaluation and treatment.    Jerritt Cardoza DPM 

## 2018-04-19 ENCOUNTER — Telehealth: Payer: Self-pay | Admitting: Endocrinology

## 2018-04-19 NOTE — Telephone Encounter (Signed)
clonazePAM (KLONOPIN) 0.5 MG tablet  Patient stated that she does not think this medication is helping her. And states that she is taking this everyday and is still very shaking and she states that she has tingling inside her legs.  She would like to know if this is a strong enough dose or if it can be increased  Please advise

## 2018-04-19 NOTE — Telephone Encounter (Signed)
I spoke with patient & stated that she could increase to five times a day. She still wanted a stronger pill, but stated that she would try adding the extra pill.

## 2018-04-19 NOTE — Telephone Encounter (Signed)
Ok, please try increasing to 5 times per day.

## 2018-04-20 ENCOUNTER — Telehealth: Payer: Self-pay | Admitting: Endocrinology

## 2018-04-22 ENCOUNTER — Telehealth: Payer: Self-pay | Admitting: Endocrinology

## 2018-04-22 MED ORDER — CLONAZEPAM 0.5 MG PO TABS: 0.5000 mg | ORAL_TABLET | Freq: Every day | ORAL | 1 refills | Status: DC | PRN

## 2018-04-22 NOTE — Telephone Encounter (Signed)
Last refilled 12/27/17 # 120 w/5 rf  LOV w/you: 12/29/17 No existing appts Ok to Rf?

## 2018-04-22 NOTE — Telephone Encounter (Signed)
I sent refill 

## 2018-04-22 NOTE — Telephone Encounter (Signed)
Need refill for  Original Order:  clonazePAM (KLONOPIN) 0.5 MG tablet [[id number]]  She stated she needed something stronger.   [facility]  [facility] [address] - [place], [place] - [address] [place]A      Please call patient when it is sent

## 2018-04-25 ENCOUNTER — Telehealth: Payer: Self-pay | Admitting: Endocrinology

## 2018-04-25 NOTE — Telephone Encounter (Signed)
Dr. Everardo AllEllison prescribed Clonazepam .5 for patient's legs but patient thinks she needs a different medication or a higher dosage. [facility] [address] per interpreter. Please call patient at ph# [phone] to let her know what will be done and patient is requesting that a letter be sent to her as well about the medication.

## 2018-04-26 ENCOUNTER — Other Ambulatory Visit: Payer: Self-pay

## 2018-04-26 NOTE — Telephone Encounter (Signed)
Please advise 

## 2018-04-26 NOTE — Telephone Encounter (Signed)
We increased to 5 times per day, to see if this would help.  I don't know what else we can do.

## 2018-04-26 NOTE — Telephone Encounter (Signed)
I called and spoke with patient to let her know that a new prescription to increase klonopin dose was sent in 5/31. I told her that dose was at he max that he was comfortable prescribing. Patient seemed to understand & stated that [facility].

## 2018-04-29 ENCOUNTER — Other Ambulatory Visit: Payer: Self-pay | Admitting: Endocrinology

## 2018-04-29 ENCOUNTER — Telehealth: Payer: Self-pay | Admitting: Endocrinology

## 2018-04-29 ENCOUNTER — Telehealth: Payer: Self-pay

## 2018-04-29 MED ORDER — CLONAZEPAM 0.5 MG PO TABS: 0.5000 mg | ORAL_TABLET | Freq: Every day | ORAL | 1 refills | Status: DC | PRN

## 2018-04-29 NOTE — Telephone Encounter (Signed)
I have faxed.  

## 2018-04-29 NOTE — Telephone Encounter (Signed)
i'll do PA 

## 2018-04-29 NOTE — Telephone Encounter (Signed)
-----   Message from Davis GourdSarah E Terrell, CMA sent at 04/29/2018  4:07 PM EDT ----- Regarding: clonazepem I was able to get PA approved for 01/29/18-04/29/19. [facility]

## 2018-04-29 NOTE — Telephone Encounter (Signed)
I can't get it to sen electronically, so I printed

## 2018-04-29 NOTE — Telephone Encounter (Signed)
clonazePAM (KLONOPIN) 0.5 MG tablet  Patient stated that she went to the pharmacy and that this medication was not sent.  [facility] [address] - [place], [place] - [address]

## 2018-04-29 NOTE — Telephone Encounter (Signed)
I called & LVM with patient that I was able to get PA for clonazepam approved. I stated that I just had to send a message to Dr. Everardo AllEllison to have him resend prescription.

## 2018-05-02 ENCOUNTER — Telehealth: Payer: Self-pay

## 2018-05-02 NOTE — Telephone Encounter (Signed)
Clonazepam 0.5 mg tab approved from 01/29/18-04/29/19 quantity up to 150 every 30 days

## 2018-05-04 ENCOUNTER — Telehealth: Payer: Self-pay | Admitting: Endocrinology

## 2018-05-04 NOTE — Telephone Encounter (Deleted)
Patient need refill for medication

## 2018-05-04 NOTE — Telephone Encounter (Signed)
error 

## 2018-05-05 ENCOUNTER — Telehealth: Payer: Self-pay | Admitting: Endocrinology

## 2018-05-05 NOTE — Telephone Encounter (Signed)
Best options are to try "mineral ice," "sportscream," or "capsaicin."

## 2018-05-05 NOTE — Telephone Encounter (Signed)
-----   Message from Davis GourdSarah E Terrell, CMA sent at 05/04/2018 11:04 AM EDT ----- Regarding: Neuropathy cream  Patient was wondering if you could prescribe her anything for neuropathy, like a cream? I recommended an OTC cream & her to ask pharmacist for advice?

## 2018-05-06 NOTE — Telephone Encounter (Signed)
I advised patient on OTC medication.

## 2018-05-12 ENCOUNTER — Telehealth: Payer: Self-pay | Admitting: Endocrinology

## 2018-05-12 NOTE — Telephone Encounter (Signed)
Pt is calling to let us know that her legs have gotten much better

## 2018-05-16 ENCOUNTER — Ambulatory Visit (INDEPENDENT_AMBULATORY_CARE_PROVIDER_SITE_OTHER): Payer: Medicare Other | Admitting: Endocrinology

## 2018-05-16 DIAGNOSIS — R001 Bradycardia, unspecified: Secondary | ICD-10-CM | POA: Insufficient documentation

## 2018-05-16 MED ORDER — CLONAZEPAM 1 MG PO TABS: 1.0000 mg | ORAL_TABLET | Freq: Three times a day (TID) | ORAL | 0 refills | Status: DC | PRN

## 2018-05-16 NOTE — Progress Notes (Signed)
Subjective:    Patient ID: Laura ShortsNancy M [name], female    DOB: 05/24/1941, 77 y.o.   MRN: [id number]  HPI Pt has many years of moderate tremor, worst at the legs, but no assoc anxiety.  She has seen psychiatry and neurol for this.  She gets inadeq control from klonopin.   Past Medical History:  Diagnosis Date  . Anxiety state, unspecified 10/10/2007  . Arthritis   . ASYMPTOMATIC POSTMENOPAUSAL STATUS 04/15/2009  . Cataract    removed both eyes   . Deaf   . Dermatitis   . Headache(784.0) 08/27/2008  . HYPERLIPIDEMIA 06/22/2007   rx refused  . HYPERTENSION 05/07/2008  . HYPOTHYROIDISM 04/15/2009  . Mild mental retardation   . 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 RESTLESS LEG SYNDROME 10/01/2008  . TACHYCARDIA 08/27/2008  . TREMOR 11/21/2007  . TRIGGER FINGER 10/30/2009    Past Surgical History:  Procedure Laterality Date  . CATARACT EXTRACTION, BILATERAL    . COLONOSCOPY      Social History   Socioeconomic History  . Marital status: Single    Spouse name: Not on file  . Number of children: Not on file  . Years of education: Not on file  . Highest education level: Not on file  Occupational History  . Occupation: Disabled    Associate Professormployer: UNEMPLOYED  Social Needs  . Financial resource strain: Not on file  . Food insecurity:    Worry: Not on file    Inability: Not on file  . Transportation needs:    Medical: Not on file    Non-medical: Not on file  Tobacco Use  . Smoking status: Never Smoker  . Smokeless tobacco: Never Used  Substance and Sexual Activity  . Alcohol use: No  . Drug use: No  . Sexual activity: Not on file  Lifestyle  . Physical activity:    Days per week: Not on file    Minutes per session: Not on file  . Stress: Not on file  Relationships  . Social connections:    Talks on phone: Not on file    Gets together: Not on file    Attends religious service: Not on file    Active member of club or organization: Not on file    Attends meetings of clubs  or organizations: Not on file    Relationship status: Not on file  . Intimate partner violence:    Fear of current or ex partner: Not on file    Emotionally abused: Not on file    Physically abused: Not on file    Forced sexual activity: Not on file  Other Topics Concern  . Not on file  Social History Narrative   Lives alone   Never married   Physical activity is very good   She has never had a driver's license   Does not have a phone at home     Current Outpatient Medications on File Prior to Visit  Medication Sig Dispense Refill  . alendronate (FOSAMAX) 70 MG tablet TAKE 1 TABLET BY MOUTH ONCE A WEEK .  TAKE  WITH  A  FULL  GLASS  OF  WATER  ON  AN  EMPTY  STOMACH 4 tablet 11  . amLODipine (NORVASC) 10 MG tablet Take 1 tablet (10 mg total) by mouth daily.    . cholecalciferol (VITAMIN D) 1000 UNITS tablet Take 1,000 Units by mouth daily.    Marland Kitchen. levETIRAcetam (KEPPRA) 500 MG tablet Take 1 tablet (500 mg  total) by mouth 2 (two) times daily.    Marland Kitchen levothyroxine (SYNTHROID, LEVOTHROID) 50 MCG tablet Take 1 tablet (50 mcg total) by mouth daily. 90 tablet 1  . Multiple Vitamin (MULTIVITAMIN) tablet Take 1 tablet by mouth daily.      Marland Kitchen triamcinolone cream (KENALOG) 0.1 % APPLY  CREAM EXTERNALLY THREE TIMES DAILY AS NEEDED FOR ITCHING 80 g 2   No current facility-administered medications on file prior to visit.     No Known Allergies  Family History  Problem Relation Age of Onset  . Thyroid disease Neg Hx   . Colon cancer Neg Hx   . Colon polyps Neg Hx   . Rectal cancer Neg Hx   . Stomach cancer Neg Hx     BP 136/76 (BP Location: Right Arm, Patient Position: Sitting, Cuff Size: Normal)   Pulse (!) 35   Wt 126 lb 6.4 oz (57.3 kg)   SpO2 96%   BMI 24.28 kg/m    Review of Systems Denies excessive diaphoresis, falls, and palpitations    Objective:   Physical Exam VITAL SIGNS:  See vs page GENERAL: no distress Neuro: no tremor of the hands.   Gait: normal and steady.     I personally reviewed electrocardiogram tracing (today): Indication: bradycardia Impression: NSR.  No MI.  No hypertrophy. Compared to 2018: SB is new     Assessment & Plan:  Bradycardia: new Tremor: she needs increased rx.   Patient Instructions  Please stop taking the metoprolol.   I have sent a prescription to your pharmacy, to increase the clonazepam. I'll see you next time.

## 2018-05-16 NOTE — Patient Instructions (Addendum)
Please stop taking the metoprolol.   I have sent a prescription to your pharmacy, to increase the clonazepam. I'll see you next time.

## 2018-05-31 ENCOUNTER — Telehealth: Payer: Self-pay | Admitting: Endocrinology

## 2018-05-31 NOTE — Telephone Encounter (Signed)
Patient had sign language interpreter call re: she has had an ongoing problem for 15 years re: her lower extremities. Dr. Everardo AllEllison prescribed a medication (Clonazepam) for this but it makes the patient too sleepy. Patient would like to know if there is a different medication she could take that will not make her so sleepy. [facility] [address] Patient's ph# [phone].

## 2018-05-31 NOTE — Telephone Encounter (Signed)
No alternative is available.

## 2018-06-01 ENCOUNTER — Telehealth: Payer: Self-pay | Admitting: Endocrinology

## 2018-06-01 NOTE — Telephone Encounter (Signed)
LVM with interpreter line for patient to call back to discuss medications.

## 2018-06-01 NOTE — Telephone Encounter (Signed)
Patient is returning call.  °

## 2018-06-01 NOTE — Telephone Encounter (Signed)
I called patient & she stated that she has heard of an injection that you can take that stops the shaking. She didn't know the name of it & neither did I. I asked that she find out the name of the injection/mediation & she could call back to discuss. I stated that efforts had been exhausted with the klonopin.

## 2018-06-01 NOTE — Telephone Encounter (Signed)
I have called and LVM with language interpreter for patient to call back. I did clearly state that unfortunately that options had been maxed out in regards to Clonazepam. I stated that she could call back with questions regarding her medication list.

## 2018-06-01 NOTE — Telephone Encounter (Signed)
Patient called and stated she is calling in regards to her medication list. And would like a call back to discuss her dosage and when she should be taking certain medications Please advise

## 2018-06-03 ENCOUNTER — Telehealth: Payer: Self-pay | Admitting: Endocrinology

## 2018-06-03 ENCOUNTER — Other Ambulatory Visit: Payer: Self-pay | Admitting: Endocrinology

## 2018-06-03 NOTE — Telephone Encounter (Signed)
There was a prescription sent on 05/16/2018 for 30 days and it is not due yet

## 2018-06-03 NOTE — Telephone Encounter (Signed)
Sign language interpreter called with patient re: needs a RX [facility] legs [facility] [address]

## 2018-06-03 NOTE — Telephone Encounter (Signed)
Her interpreter stated that she has attempted to explain this to the pt and she apologized for calling

## 2018-06-03 NOTE — Telephone Encounter (Signed)
Sign language interpreter calling with patient-patient needs [facility]  [address] ph# [phone]

## 2018-06-03 NOTE — Telephone Encounter (Signed)
Will you fill for Dr. Everardo AllEllison?

## 2018-06-07 ENCOUNTER — Telehealth: Payer: Self-pay

## 2018-06-07 NOTE — Telephone Encounter (Signed)
Please advise 

## 2018-06-07 NOTE — Telephone Encounter (Signed)
lft vm 

## 2018-06-07 NOTE — Telephone Encounter (Signed)
Spoke with interpretor for this patient and she has questions about Klonopin she feels that it is not working her legs are worse and she wants to take the 1 mg 3 times daily of this instead of the 0.5 because she states that it works better  for her tremors- please advise if this can be increased- good call back number is [phone] (interpretor) can leave a detailed message-

## 2018-06-07 NOTE — Telephone Encounter (Signed)
OK 

## 2018-06-08 ENCOUNTER — Other Ambulatory Visit: Payer: Self-pay

## 2018-06-08 NOTE — Telephone Encounter (Signed)
The rx for 1 mg was sent on 05/16/18.

## 2018-06-08 NOTE — Telephone Encounter (Signed)
I have called & LVM via interpreter services that it was ok for patient to increase to 1 mg 3x daily. I asked that if she did not understand directions that she call back.

## 2018-06-08 NOTE — Telephone Encounter (Signed)
Can this be resent or do you just want her to double what she currently has until she runs out?

## 2018-06-09 ENCOUNTER — Telehealth: Payer: Self-pay | Admitting: Endocrinology

## 2018-06-09 NOTE — Telephone Encounter (Signed)
Patient stated she is needing a new medication prescribed for the shaking in her legs.  She is stating that the  clonazePAM (KLONOPIN) 1 MG tablet   is not working because she is still having the shaky legs.    [facility] [address] - [place], [place] - [address]

## 2018-06-09 NOTE — Telephone Encounter (Addendum)
Please understand there is nothing further I can do.  Pt has already seen specialist, and said pt is not a candidate for that procedure.

## 2018-06-09 NOTE — Telephone Encounter (Signed)
Patient is stating that she wants "yellow" pill back. She wants the tremors to stop & wants the medication increased to 2 mg three times a day. Patient has gone on & on about how she wants tremors to stop. Is there any alternatives to klonopin that would be stronger to take away tremors? Please advise?

## 2018-06-10 ENCOUNTER — Telehealth: Payer: Self-pay

## 2018-06-10 NOTE — Telephone Encounter (Signed)
Spoke with pt thru her interpreter and she wanted to inform Dr. Everardo AllEllison that she went for a walk about 9 this morning, went out to eat and took her meds (1 mg klonopin) and that the trembling has gotten better. I tried to remind pt that I had spoken to her @ 9:30 and that she had stated that she wanted something to be done about the restless legs and trembles

## 2018-06-10 NOTE — Telephone Encounter (Signed)
Pt called and was requesting refills on Klonopin. When asked how many she has left, pt counted her pills and stated via hearing impaired interpreter, that there was 30 pills left. I told pt that 30 pills will last her 10 days if she is taking as prescribed, which is 3 tablets daily. Pt insisted that she needed a refill today. Pt was then told that she will not get a refill until she runs out of medication.

## 2018-06-10 NOTE — Telephone Encounter (Signed)
Pt called and was discussing a friend of her cousin that had a procedure done and it helped her but I informed pt that without any information that I could not help her out with that and she kept asking for an "orange pill"and saying that her legs are restless and that she just wants something sent to her pharmacy.

## 2018-06-13 ENCOUNTER — Telehealth: Payer: Self-pay

## 2018-06-13 NOTE — Telephone Encounter (Signed)
Patient calling today to let us know that there has been some improvement with her tremors and wants to know if she should continue with clonazepam- if she needs to continue taking this she will need a refill- please advise

## 2018-06-14 MED ORDER — CLONAZEPAM 1 MG PO TABS: 1.0000 mg | ORAL_TABLET | Freq: Three times a day (TID) | ORAL | 2 refills | Status: DC | PRN

## 2018-06-15 NOTE — Telephone Encounter (Signed)
Patient calling again today please advise on previous note

## 2018-06-15 NOTE — Telephone Encounter (Signed)
RF was sent yesterday

## 2018-06-29 ENCOUNTER — Telehealth: Payer: Self-pay | Admitting: Emergency Medicine

## 2018-06-29 NOTE — Telephone Encounter (Signed)
Pt called stating she is having tremors in her legs. She said the clonazepam is not working and wants to know if there is another medicine that will help with the tremors. Please advise thanks.

## 2018-06-29 NOTE — Telephone Encounter (Signed)
Please advise 

## 2018-06-29 NOTE — Telephone Encounter (Signed)
Sorry, there is nothing else I can do.

## 2018-07-01 NOTE — Telephone Encounter (Signed)
I called patient back but received no answer.

## 2018-07-04 ENCOUNTER — Telehealth: Payer: Self-pay | Admitting: Endocrinology

## 2018-07-04 NOTE — Telephone Encounter (Signed)
error 

## 2018-07-14 ENCOUNTER — Encounter (HOSPITAL_COMMUNITY): Payer: Self-pay

## 2018-07-14 ENCOUNTER — Emergency Department (HOSPITAL_COMMUNITY): Payer: Medicare Other

## 2018-07-14 ENCOUNTER — Inpatient Hospital Stay (HOSPITAL_COMMUNITY)
Admission: EM | Admit: 2018-07-14 | Discharge: 2018-07-19 | DRG: 065 | Disposition: A | Discharge status: Rehabilitation Facility | Payer: Medicare Other | Attending: Internal Medicine | Admitting: Internal Medicine

## 2018-07-14 DIAGNOSIS — E8809 Other disorders of plasma-protein metabolism, not elsewhere classified: Secondary | ICD-10-CM | POA: Diagnosis present

## 2018-07-14 DIAGNOSIS — R4701 Aphasia: Secondary | ICD-10-CM | POA: Diagnosis present

## 2018-07-14 DIAGNOSIS — E039 Hypothyroidism, unspecified: Secondary | ICD-10-CM | POA: Diagnosis present

## 2018-07-14 DIAGNOSIS — Z91128 Patient's intentional underdosing of medication regimen for other reason: Secondary | ICD-10-CM

## 2018-07-14 DIAGNOSIS — F70 Mild intellectual disabilities: Secondary | ICD-10-CM | POA: Diagnosis present

## 2018-07-14 DIAGNOSIS — Z9841 Cataract extraction status, right eye: Secondary | ICD-10-CM

## 2018-07-14 DIAGNOSIS — I639 Cerebral infarction, unspecified: Secondary | ICD-10-CM | POA: Diagnosis present

## 2018-07-14 DIAGNOSIS — G2581 Restless legs syndrome: Secondary | ICD-10-CM | POA: Diagnosis present

## 2018-07-14 DIAGNOSIS — R4702 Dysphasia: Secondary | ICD-10-CM | POA: Diagnosis present

## 2018-07-14 DIAGNOSIS — Z9842 Cataract extraction status, left eye: Secondary | ICD-10-CM

## 2018-07-14 DIAGNOSIS — R946 Abnormal results of thyroid function studies: Secondary | ICD-10-CM | POA: Diagnosis present

## 2018-07-14 DIAGNOSIS — E119 Type 2 diabetes mellitus without complications: Secondary | ICD-10-CM

## 2018-07-14 DIAGNOSIS — R260 Ataxic gait: Secondary | ICD-10-CM | POA: Diagnosis present

## 2018-07-14 DIAGNOSIS — R001 Bradycardia, unspecified: Secondary | ICD-10-CM | POA: Diagnosis present

## 2018-07-14 DIAGNOSIS — I119 Hypertensive heart disease without heart failure: Secondary | ICD-10-CM | POA: Diagnosis present

## 2018-07-14 DIAGNOSIS — R0981 Nasal congestion: Secondary | ICD-10-CM | POA: Diagnosis not present

## 2018-07-14 DIAGNOSIS — R270 Ataxia, unspecified: Secondary | ICD-10-CM | POA: Diagnosis not present

## 2018-07-14 DIAGNOSIS — H913 Deaf nonspeaking, not elsewhere classified: Secondary | ICD-10-CM | POA: Diagnosis not present

## 2018-07-14 DIAGNOSIS — I63422 Cerebral infarction due to embolism of left anterior cerebral artery: Secondary | ICD-10-CM

## 2018-07-14 DIAGNOSIS — L309 Dermatitis, unspecified: Secondary | ICD-10-CM | POA: Diagnosis present

## 2018-07-14 DIAGNOSIS — T381X6A Underdosing of thyroid hormones and substitutes, initial encounter: Secondary | ICD-10-CM | POA: Diagnosis present

## 2018-07-14 DIAGNOSIS — I69319 Unspecified symptoms and signs involving cognitive functions following cerebral infarction: Secondary | ICD-10-CM | POA: Diagnosis not present

## 2018-07-14 DIAGNOSIS — I69351 Hemiplegia and hemiparesis following cerebral infarction affecting right dominant side: Secondary | ICD-10-CM | POA: Diagnosis not present

## 2018-07-14 DIAGNOSIS — G9389 Other specified disorders of brain: Secondary | ICD-10-CM | POA: Diagnosis present

## 2018-07-14 DIAGNOSIS — G9349 Other encephalopathy: Secondary | ICD-10-CM | POA: Diagnosis present

## 2018-07-14 DIAGNOSIS — E785 Hyperlipidemia, unspecified: Secondary | ICD-10-CM | POA: Diagnosis present

## 2018-07-14 DIAGNOSIS — I493 Ventricular premature depolarization: Secondary | ICD-10-CM | POA: Diagnosis present

## 2018-07-14 DIAGNOSIS — R404 Transient alteration of awareness: Secondary | ICD-10-CM

## 2018-07-14 DIAGNOSIS — Z9181 History of falling: Secondary | ICD-10-CM | POA: Diagnosis not present

## 2018-07-14 DIAGNOSIS — R739 Hyperglycemia, unspecified: Secondary | ICD-10-CM | POA: Diagnosis not present

## 2018-07-14 DIAGNOSIS — E781 Pure hyperglyceridemia: Secondary | ICD-10-CM | POA: Diagnosis present

## 2018-07-14 DIAGNOSIS — I491 Atrial premature depolarization: Secondary | ICD-10-CM | POA: Diagnosis not present

## 2018-07-14 DIAGNOSIS — R000 Tachycardia, unspecified: Secondary | ICD-10-CM | POA: Diagnosis not present

## 2018-07-14 DIAGNOSIS — F411 Generalized anxiety disorder: Secondary | ICD-10-CM | POA: Diagnosis present

## 2018-07-14 DIAGNOSIS — I63522 Cerebral infarction due to unspecified occlusion or stenosis of left anterior cerebral artery: Principal | ICD-10-CM | POA: Diagnosis present

## 2018-07-14 DIAGNOSIS — I6389 Other cerebral infarction: Secondary | ICD-10-CM | POA: Diagnosis not present

## 2018-07-14 DIAGNOSIS — G40909 Epilepsy, unspecified, not intractable, without status epilepticus: Secondary | ICD-10-CM | POA: Diagnosis present

## 2018-07-14 DIAGNOSIS — Z8673 Personal history of transient ischemic attack (TIA), and cerebral infarction without residual deficits: Secondary | ICD-10-CM | POA: Diagnosis not present

## 2018-07-14 DIAGNOSIS — G9340 Encephalopathy, unspecified: Secondary | ICD-10-CM | POA: Diagnosis present

## 2018-07-14 DIAGNOSIS — I63233 Cerebral infarction due to unspecified occlusion or stenosis of bilateral carotid arteries: Secondary | ICD-10-CM | POA: Diagnosis not present

## 2018-07-14 DIAGNOSIS — Z7983 Long term (current) use of bisphosphonates: Secondary | ICD-10-CM

## 2018-07-14 DIAGNOSIS — G8194 Hemiplegia, unspecified affecting left nondominant side: Secondary | ICD-10-CM | POA: Diagnosis present

## 2018-07-14 DIAGNOSIS — I10 Essential (primary) hypertension: Secondary | ICD-10-CM | POA: Diagnosis present

## 2018-07-14 DIAGNOSIS — H9190 Unspecified hearing loss, unspecified ear: Secondary | ICD-10-CM | POA: Diagnosis present

## 2018-07-14 DIAGNOSIS — H9193 Unspecified hearing loss, bilateral: Secondary | ICD-10-CM | POA: Diagnosis not present

## 2018-07-14 DIAGNOSIS — R29702 NIHSS score 2: Secondary | ICD-10-CM | POA: Diagnosis present

## 2018-07-14 DIAGNOSIS — Z780 Asymptomatic menopausal state: Secondary | ICD-10-CM

## 2018-07-14 DIAGNOSIS — Z79890 Hormone replacement therapy (postmenopausal): Secondary | ICD-10-CM

## 2018-07-14 DIAGNOSIS — F840 Autistic disorder: Secondary | ICD-10-CM | POA: Diagnosis present

## 2018-07-14 DIAGNOSIS — I4430 Unspecified atrioventricular block: Secondary | ICD-10-CM | POA: Diagnosis not present

## 2018-07-14 DIAGNOSIS — W19XXXA Unspecified fall, initial encounter: Secondary | ICD-10-CM | POA: Diagnosis not present

## 2018-07-14 DIAGNOSIS — Z79899 Other long term (current) drug therapy: Secondary | ICD-10-CM | POA: Diagnosis not present

## 2018-07-14 DIAGNOSIS — I441 Atrioventricular block, second degree: Secondary | ICD-10-CM | POA: Diagnosis present

## 2018-07-14 DIAGNOSIS — I69311 Memory deficit following cerebral infarction: Secondary | ICD-10-CM | POA: Diagnosis not present

## 2018-07-14 DIAGNOSIS — R259 Unspecified abnormal involuntary movements: Secondary | ICD-10-CM

## 2018-07-14 DIAGNOSIS — R4182 Altered mental status, unspecified: Secondary | ICD-10-CM | POA: Diagnosis not present

## 2018-07-14 DIAGNOSIS — I440 Atrioventricular block, first degree: Secondary | ICD-10-CM | POA: Diagnosis not present

## 2018-07-14 DIAGNOSIS — Z8619 Personal history of other infectious and parasitic diseases: Secondary | ICD-10-CM

## 2018-07-14 DIAGNOSIS — M1990 Unspecified osteoarthritis, unspecified site: Secondary | ICD-10-CM | POA: Diagnosis present

## 2018-07-14 DIAGNOSIS — R32 Unspecified urinary incontinence: Secondary | ICD-10-CM | POA: Diagnosis present

## 2018-07-14 DIAGNOSIS — M810 Age-related osteoporosis without current pathological fracture: Secondary | ICD-10-CM | POA: Diagnosis present

## 2018-07-14 DIAGNOSIS — G8311 Monoplegia of lower limb affecting right dominant side: Secondary | ICD-10-CM | POA: Diagnosis present

## 2018-07-14 DIAGNOSIS — Z7952 Long term (current) use of systemic steroids: Secondary | ICD-10-CM

## 2018-07-14 DIAGNOSIS — R29703 NIHSS score 3: Secondary | ICD-10-CM | POA: Diagnosis not present

## 2018-07-14 DIAGNOSIS — G8191 Hemiplegia, unspecified affecting right dominant side: Secondary | ICD-10-CM | POA: Diagnosis not present

## 2018-07-14 DIAGNOSIS — Z8679 Personal history of other diseases of the circulatory system: Secondary | ICD-10-CM

## 2018-07-14 DIAGNOSIS — R0902 Hypoxemia: Secondary | ICD-10-CM | POA: Diagnosis not present

## 2018-07-14 LAB — COMPREHENSIVE METABOLIC PANEL
ALBUMIN: 3.5 g/dL (ref 3.5–5.0)
ALT: 14 U/L (ref 0–44)
AST: 25 U/L (ref 15–41)
Alkaline Phosphatase: 57 U/L (ref 38–126)
Anion gap: 7 (ref 5–15)
BUN: 18 mg/dL (ref 8–23)
CO2: 30 mmol/L (ref 22–32)
CREATININE: 0.83 mg/dL (ref 0.44–1.00)
Calcium: 8.6 mg/dL — ABNORMAL LOW (ref 8.9–10.3)
Chloride: 100 mmol/L (ref 98–111)
GFR calc Af Amer: >60 mL/min (ref >60)
GLUCOSE: 102 mg/dL — AB (ref 70–99)
Potassium: 4.2 mmol/L (ref 3.5–5.1)
Sodium: 137 mmol/L (ref 135–145)
TOTAL PROTEIN: 6.6 g/dL (ref 6.5–8.1)
Total Bilirubin: 0.6 mg/dL (ref 0.3–1.2)

## 2018-07-14 LAB — CBC WITH DIFFERENTIAL/PLATELET
ABS IMMATURE GRANULOCYTES: 0 10*3/uL (ref 0.0–0.1)
BASOS ABS: 0.1 10*3/uL (ref 0.0–0.1)
BASOS PCT: 1 %
EOS PCT: 2 %
Eosinophils Absolute: 0.2 10*3/uL (ref 0.0–0.7)
HCT: 45.8 % (ref 36.0–46.0)
HEMOGLOBIN: 14.4 g/dL (ref 12.0–15.0)
Immature Granulocytes: 1 %
LYMPHS PCT: 23 %
Lymphs Abs: 1.7 10*3/uL (ref 0.7–4.0)
MCH: 29.6 pg (ref 26.0–34.0)
MCHC: 31.4 g/dL (ref 30.0–36.0)
MCV: 94.2 fL (ref 78.0–100.0)
MONO ABS: 0.8 10*3/uL (ref 0.1–1.0)
Monocytes Relative: 11 %
Neutro Abs: 4.7 10*3/uL (ref 1.7–7.7)
Neutrophils Relative %: 62 %
Platelets: 223 10*3/uL (ref 150–400)
RBC: 4.86 MIL/uL (ref 3.87–5.11)
RDW: 12.6 % (ref 11.5–15.5)
WBC: 7.5 10*3/uL (ref 4.0–10.5)

## 2018-07-14 LAB — CBG MONITORING, ED: Glucose-Capillary: 96 mg/dL (ref 70–99)

## 2018-07-14 LAB — PROTIME-INR
INR: 1.02
PROTHROMBIN TIME: 13.3 s (ref 11.4–15.2)

## 2018-07-14 LAB — URINALYSIS, ROUTINE W REFLEX MICROSCOPIC
Bilirubin Urine: NEGATIVE
Glucose, UA: NEGATIVE mg/dL
Hgb urine dipstick: NEGATIVE
KETONES UR: NEGATIVE mg/dL
LEUKOCYTES UA: NEGATIVE
NITRITE: NEGATIVE
PH: 7 (ref 5.0–8.0)
Protein, ur: NEGATIVE mg/dL
SPECIFIC GRAVITY, URINE: 1.006 (ref 1.005–1.030)

## 2018-07-14 LAB — I-STAT CG4 LACTIC ACID, ED: LACTIC ACID, VENOUS: 0.89 mmol/L (ref 0.5–1.9)

## 2018-07-14 MED ORDER — SODIUM CHLORIDE 0.9 % IV BOLUS
1000.0000 mL | Freq: Once | INTRAVENOUS | Status: AC
  Administered 2018-07-14: 1000 mL via INTRAVENOUS

## 2018-07-14 MED ORDER — ACETAMINOPHEN 325 MG PO TABS
650.0000 mg | ORAL_TABLET | Freq: Four times a day (QID) | ORAL | Status: DC | PRN
  Administered 2018-07-15: 650 mg via ORAL
  Filled 2018-07-14: qty 2

## 2018-07-14 MED ORDER — ONDANSETRON HCL 4 MG PO TABS: 4.0000 mg | ORAL_TABLET | Freq: Four times a day (QID) | ORAL | Status: DC | PRN

## 2018-07-14 MED ORDER — LEVETIRACETAM 500 MG PO TABS
500.0000 mg | ORAL_TABLET | Freq: Two times a day (BID) | ORAL | Status: DC
  Administered 2018-07-15 – 2018-07-18 (×8): 500 mg via ORAL
  Filled 2018-07-14 (×8): qty 1

## 2018-07-14 MED ORDER — ACETAMINOPHEN 650 MG RE SUPP: 650.0000 mg | Freq: Four times a day (QID) | RECTAL | Status: DC | PRN

## 2018-07-14 MED ORDER — LEVOTHYROXINE SODIUM 50 MCG PO TABS
50.0000 ug | ORAL_TABLET | Freq: Every day | ORAL | Status: DC
  Administered 2018-07-15 – 2018-07-19 (×5): 50 ug via ORAL
  Filled 2018-07-14 (×5): qty 1

## 2018-07-14 MED ORDER — ONDANSETRON HCL 4 MG/2ML IJ SOLN: 4.0000 mg | Freq: Four times a day (QID) | INTRAMUSCULAR | Status: DC | PRN

## 2018-07-14 MED ORDER — CLONAZEPAM 1 MG PO TABS
1.0000 mg | ORAL_TABLET | Freq: Three times a day (TID) | ORAL | Status: DC | PRN
  Administered 2018-07-15 (×3): 1 mg via ORAL
  Filled 2018-07-14: qty 1
  Filled 2018-07-14: qty 2
  Filled 2018-07-14 (×2): qty 1

## 2018-07-14 MED ORDER — SODIUM CHLORIDE 0.9 % IV SOLN
INTRAVENOUS | Status: DC
  Administered 2018-07-14 – 2018-07-15 (×3): via INTRAVENOUS
  Administered 2018-07-16: 1000 mL via INTRAVENOUS
  Administered 2018-07-16 – 2018-07-17 (×2): via INTRAVENOUS

## 2018-07-14 MED ORDER — AMLODIPINE BESYLATE 5 MG PO TABS: 10.0000 mg | ORAL_TABLET | Freq: Every day | ORAL | Status: DC

## 2018-07-14 NOTE — ED Notes (Signed)
Pt strong on feet for ortho VS. Pulse maintains around 55-70 throughout entire procedure. No light-headed feelings reported through interpreter.

## 2018-07-14 NOTE — ED Provider Notes (Signed)
[facility] EMERGENCY DEPARTMENT Provider Note   CSN: [id number] Arrival date & time: 07/14/18  1808     History   Chief Complaint Chief Complaint  Patient presents with  . Dizziness    HPI Laura [name] is a 77 y.o. female.  HPI  This patient presents from an assisted living facility with concern of dizziness, confusion. Patient has multiple medical issues including autism spectrum disorder, and deafness. History provided by EMS providers, power of attorney, and translator. Onset seems to be about a week ago, and since that time she has had some gait difficulty, lightheadedness, some dizziness, without falling, without syncope. She denies pain, but notes that she has numbness in her legs bilaterally. No clear precipitating, alleviating, exacerbating factors.  Past Medical History:  Diagnosis Date  . Anxiety state, unspecified 10/10/2007  . Arthritis   . ASYMPTOMATIC POSTMENOPAUSAL STATUS 04/15/2009  . Cataract    removed both eyes   . Deaf   . Dermatitis   . Headache(784.0) 08/27/2008  . HYPERLIPIDEMIA 06/22/2007   rx refused  . HYPERTENSION 05/07/2008  . HYPOTHYROIDISM 04/15/2009  . Mild mental retardation   . 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 RESTLESS LEG SYNDROME 10/01/2008  . TACHYCARDIA 08/27/2008  . TREMOR 11/21/2007  . TRIGGER FINGER 10/30/2009    Patient Active Problem List   Diagnosis Date Noted  . Bradycardia 05/16/2018  . Cognitive changes 06/03/2017  . Anemia 05/18/2017  . CVA (cerebral vascular accident) (HCC) 04/06/2017  . Bleeding, intracranial (HCC)   . Sepsis (HCC)   . Subdural hematoma (HCC)   . Trauma   . Osteoporosis 03/23/2017  . Menopause 03/25/2016  . Hyperglycemia 08/07/2015  . Contusion of left chest wall 02/05/2014  . Routine general medical examination at a health care facility 01/19/2014  . Left ventricular hypertrophy 07/15/2011  . Encounter for long-term (current) use of other medications 06/29/2011    . 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 TRIGGER FINGER 10/30/2009  . Hypothyroidism 04/15/2009  . ABNORMAL ELECTROCARDIOGRAM 04/15/2009  . RESTLESS LEG SYNDROME 10/01/2008  . HEADACHE 08/27/2008  . TACHYCARDIA 08/27/2008  . Essential hypertension 05/07/2008  . Abnormal involuntary movement 11/21/2007  . ANXIETY STATE, UNSPECIFIED 10/10/2007  . Dyslipidemia 06/22/2007    Past Surgical History:  Procedure Laterality Date  . CATARACT EXTRACTION, BILATERAL    . COLONOSCOPY       OB History   None      Home Medications    Prior to Admission medications   Medication Sig Start Date End Date Taking? Authorizing Provider  alendronate (FOSAMAX) 70 MG tablet TAKE 1 TABLET BY MOUTH ONCE A WEEK .  TAKE  WITH  A  FULL  GLASS  OF  WATER  ON  AN  EMPTY  STOMACH 04/14/18   Romero Belling, MD  amLODipine (NORVASC) 10 MG tablet Take 1 tablet (10 mg total) by mouth daily. 04/13/17   Osvaldo Shipper, MD  cholecalciferol (VITAMIN D) 1000 UNITS tablet Take 1,000 Units by mouth daily.    [provider]  clonazePAM (KLONOPIN) 1 MG tablet Take 1 tablet (1 mg total) by mouth 3 (three) times daily as needed (tremor). 06/14/18   Romero Belling, MD  levETIRAcetam (KEPPRA) 500 MG tablet Take 1 tablet (500 mg total) by mouth 2 (two) times daily. 04/13/17   Osvaldo Shipper, MD  levothyroxine (SYNTHROID, LEVOTHROID) 50 MCG tablet Take 1 tablet (50 mcg total) by mouth daily. 02/08/18   Romero Belling, MD  Multiple Vitamin (MULTIVITAMIN)  tablet Take 1 tablet by mouth daily.      [provider]  triamcinolone cream (KENALOG) 0.1 % APPLY  CREAM EXTERNALLY THREE TIMES DAILY AS NEEDED FOR ITCHING 07/28/16   Romero Belling, MD    Family History Family History  Problem Relation Age of Onset  . Thyroid disease Neg Hx   . Colon cancer Neg Hx   . Colon polyps Neg Hx   . Rectal cancer Neg Hx   . Stomach cancer Neg Hx     Social History Social History   Tobacco Use  . Smoking status: Never  Smoker  . Smokeless tobacco: Never Used  Substance Use Topics  . Alcohol use: No  . Drug use: No     Allergies   Patient has no known allergies.   Review of Systems Review of Systems  Constitutional:       Per HPI, otherwise negative  HENT:       Per HPI, otherwise negative  Respiratory:       Per HPI, otherwise negative  Cardiovascular:       Per HPI, otherwise negative  Gastrointestinal: Negative for vomiting.  Endocrine:       Negative aside from HPI  Genitourinary:       Neg aside from HPI   Musculoskeletal:       Per HPI, otherwise negative  Skin: Negative.   Neurological: Positive for dizziness, light-headedness and numbness. Negative for syncope.  Psychiatric/Behavioral:       Autism spectrum     Physical Exam Updated Vital Signs Ht 5\' 3"  (1.6 m)   Wt 57.3 kg   BMI 22.38 kg/m   Physical Exam  Constitutional: She is oriented to person, place, and time. She appears well-developed and well-nourished. No distress.  HENT:  Head: Normocephalic and atraumatic.  Eyes: Conjunctivae and EOM are normal.  Cardiovascular: Normal rate and regular rhythm.  Pulmonary/Chest: Effort normal and breath sounds normal. No stridor. No respiratory distress.  Abdominal: She exhibits no distension.  Musculoskeletal: She exhibits no deformity.  Neurological: She is alert and oriented to person, place, and time.  Deaf, but communicates with sign language with our interpreter and her power of attorney, seemingly without difficulty.  Skin: Skin is warm and dry.  Psychiatric:  Difficult to assess secondary to deafness, family, and interpreter notes that the patient confusion, disorientation compared to 1 week ago.   Nursing note and vitals reviewed.    ED Treatments / Results  Labs (all labs ordered are listed, but only abnormal results are displayed) Labs Reviewed  COMPREHENSIVE METABOLIC PANEL - Abnormal; Notable for the following components:      Result Value   Glucose,  Bld 102 (*)    Calcium 8.6 (*)    All other components within normal limits  URINALYSIS, ROUTINE W REFLEX MICROSCOPIC - Abnormal; Notable for the following components:   Color, Urine STRAW (*)    All other components within normal limits  URINE CULTURE  CBC WITH DIFFERENTIAL/PLATELET  PROTIME-INR  CBG MONITORING, ED  I-STAT CG4 LACTIC ACID, ED    EKG EKG Interpretation  Date/Time:  Thursday July 14 2018 18:11:18 EDT Ventricular Rate:  66 PR Interval:    QRS Duration: 118 QT Interval:  460 QTC Calculation: 482 R Axis:   -88 Text Interpretation:  Sinus rhythm Multiple ventricular premature complexes Probable left atrial enlargement Nonspecific IVCD with LAD Artifact Abnormal ekg Confirmed by Gerhard Munch [phone]) on 07/14/2018 6:19:27 PM   Radiology Ct Head Wo  Contrast  Result Date: 07/14/2018 CLINICAL DATA:  77 year old female with acute ataxia EXAM: CT HEAD WITHOUT CONTRAST TECHNIQUE: Contiguous axial images were obtained from the base of the skull through the vertex without intravenous contrast. COMPARISON:  06/14/2017 FINDINGS: Brain: No evidence of acute infarction, hemorrhage, hydrocephalus, extra-axial collection or mass lesion/mass effect. Mild chronic small-vessel white matter ischemic changes again identified. LEFT temporal and basal ganglia encephalomalacia again noted. Vascular: Carotid atherosclerotic calcifications again identified. Skull: No acute abnormality Sinuses/Orbits: No acute abnormality Other: None IMPRESSION: 1. No evidence of acute intracranial abnormality. 2. Mild chronic small-vessel white matter ischemic changes and LEFT temporal/basal ganglia encephalomalacia. Electronically Signed   By: Harmon PierJeffrey  Hu M.D.   On: 07/14/2018 19:23   Dg Chest Port 1 View  Result Date: 07/14/2018 CLINICAL DATA:  Altered mental status. EXAM: PORTABLE CHEST 1 VIEW COMPARISON:  04/07/2017 FINDINGS: Lungs are adequately inflated without focal consolidation or effusion. Mild  stable cardiomegaly. Remainder of the exam is unchanged. IMPRESSION: No acute cardiopulmonary disease. Mild stable cardiomegaly. Electronically Signed   By: Elberta Fortisaniel  Boyle M.D.   On: 07/14/2018 19:26    Procedures Procedures (including critical care time)  Medications Ordered in ED Medications  sodium chloride 0.9 % bolus 1,000 mL (has no administration in time range)    And  0.9 %  sodium chloride infusion (has no administration in time range)     Initial Impression / Assessment and Plan / ED Course  I have reviewed the triage vital signs and the nursing notes.  Pertinent labs & imaging results that were available during my care of the patient were reviewed by me and considered in my medical decision making (see chart for details).    Update: Patient in no distress. Update: Patient accompanied by multiple family members, as well as her power of attorney, and our Nurse, learning disabilitytranslator. We discussed findings thus far. Family corroborates that the patient has a notable change in mental status over the past few days, possibly week, with new confusion, new difficulty with orientation, as well as persistent dizziness. We discussed the patient's initially reassuring findings, including no evidence for acute new hemorrhage, infection, substantial electrolyte abnormalities. However, given the patient's change from baseline mental status, she will require admission for further evaluation and management, including consideration of polypharmacy, occult neurologic phenomena, or infection.    Final Clinical Impressions(s) / ED Diagnoses   Final diagnoses:  None    ED Discharge Orders    None       Gerhard MunchLockwood, Daylyn Christine, MD 07/14/18 2208

## 2018-07-14 NOTE — H&P (Addendum)
History and Physical    DEMA TIMMONS ZOX:[id number] DOB: 25-Feb-1941 DOA: 07/14/2018  PCP: Romero Belling, MD  Patient coming from: ILF  I have personally briefly reviewed patient's old medical records in [facility]  Chief Complaint: AMS  HPI: JOANY KHATIB is a 77 y.o. female with medical history significant of autism spectrum disorder, deafness, HTN, ICH in May 2018, hypothyroidism, chronic tremor.  Normally patient lives in independent living facility and is quite functional.  Over the past week or so she has had some gait difficulty, lightheadedness, dizziness without falling.  No syncope.  Confusion.  No pain, no clear precipitating, alleviating, nor exacerbating factors.  No witnessed tonic-clonic seizure like activity though it does seem somewhat episodic by caregivers descriptions.   ED Course: CBC, BMP, UA, CXR, all negative, CT head just shows chronic encephalomalacia changes.  Of note she is bradycardic with irregular sinus patern.  Bradycardic down into the mid 30s at times while resting.  Orthostatics negative though.   Review of Systems: As per HPI otherwise 10 point review of systems negative.   Past Medical History:  Diagnosis Date  . Anxiety state, unspecified 10/10/2007  . Arthritis   . ASYMPTOMATIC POSTMENOPAUSAL STATUS 04/15/2009  . Cataract    removed both eyes   . Deaf   . Dermatitis   . Headache(784.0) 08/27/2008  . HYPERLIPIDEMIA 06/22/2007   rx refused  . HYPERTENSION 05/07/2008  . HYPOTHYROIDISM 04/15/2009  . Mild mental retardation   . 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 RESTLESS LEG SYNDROME 10/01/2008  . TACHYCARDIA 08/27/2008  . TREMOR 11/21/2007  . TRIGGER FINGER 10/30/2009    Past Surgical History:  Procedure Laterality Date  . CATARACT EXTRACTION, BILATERAL    . COLONOSCOPY       reports that she has never smoked. She has never used smokeless tobacco. She reports that she does not drink alcohol or use drugs.  No Known  Allergies  Family History  Problem Relation Age of Onset  . Thyroid disease Neg Hx   . Colon cancer Neg Hx   . Colon polyps Neg Hx   . Rectal cancer Neg Hx   . Stomach cancer Neg Hx      Prior to Admission medications   Medication Sig Start Date End Date Taking? Authorizing Provider  alendronate (FOSAMAX) 70 MG tablet TAKE 1 TABLET BY MOUTH ONCE A WEEK .  TAKE  WITH  A  FULL  GLASS  OF  WATER  ON  AN  EMPTY  STOMACH 04/14/18   Romero Belling, MD  amLODipine (NORVASC) 10 MG tablet Take 1 tablet (10 mg total) by mouth daily. 04/13/17   Osvaldo Shipper, MD  cholecalciferol (VITAMIN D) 1000 UNITS tablet Take 1,000 Units by mouth daily.    [provider]  clonazePAM (KLONOPIN) 1 MG tablet Take 1 tablet (1 mg total) by mouth 3 (three) times daily as needed (tremor). 06/14/18   Romero Belling, MD  levETIRAcetam (KEPPRA) 500 MG tablet Take 1 tablet (500 mg total) by mouth 2 (two) times daily. 04/13/17   Osvaldo Shipper, MD  levothyroxine (SYNTHROID, LEVOTHROID) 50 MCG tablet Take 1 tablet (50 mcg total) by mouth daily. 02/08/18   Romero Belling, MD  Multiple Vitamin (MULTIVITAMIN) tablet Take 1 tablet by mouth daily.      [provider]  triamcinolone cream (KENALOG) 0.1 % APPLY  CREAM EXTERNALLY THREE TIMES DAILY AS NEEDED FOR ITCHING 07/28/16   Romero Belling, MD    Physical Exam:  Vitals:   07/14/18 2245 07/14/18 2246 07/14/18 2259 07/14/18 2300  BP: (!) 172/70   (!) 162/89  Pulse: (!) 58  80   Resp:  (!) 24 (!) 22   SpO2: 97%  99%   Weight:      Height:        Constitutional: NAD, calm, comfortable Eyes: PERRL, lids and conjunctivae normal ENMT: Mucous membranes are moist. Posterior pharynx clear of any exudate or lesions.Normal dentition. Deaf Neck: normal, supple, no masses, no thyromegaly Respiratory: clear to auscultation bilaterally, no wheezing, no crackles. Normal respiratory effort. No accessory muscle use.  Cardiovascular: IRR, IRR, bradycardic Abdomen: no  tenderness, no masses palpated. No hepatosplenomegaly. Bowel sounds positive.  Musculoskeletal: no clubbing / cyanosis. No joint deformity upper and lower extremities. Good ROM, no contractures. Normal muscle tone.  Skin: no rashes, lesions, ulcers. No induration Neurologic: CN 2-12 grossly intact. Sensation intact, DTR normal. Strength 5/5 in all 4.  Psychiatric: Difficult to assess due to deafness, autism, communicates via interpreter.  Family and interpreter notes patient has confusion and disorientation compared to 1 week ago.   Labs on Admission: I have personally reviewed following labs and imaging studies  CBC: Recent Labs  Lab 07/14/18 1842  WBC 7.5  NEUTROABS 4.7  HGB 14.4  HCT 45.8  MCV 94.2  PLT 223   Basic Metabolic Panel: Recent Labs  Lab 07/14/18 1842  NA 137  K 4.2  CL 100  CO2 30  GLUCOSE 102*  BUN 18  CREATININE 0.83  CALCIUM 8.6*   GFR: Estimated Creatinine Clearance: 48.4 mL/min (by C-G formula based on SCr of 0.83 mg/dL). Liver Function Tests: Recent Labs  Lab 07/14/18 1842  AST 25  ALT 14  ALKPHOS 57  BILITOT 0.6  PROT 6.6  ALBUMIN 3.5   No results for input(s): LIPASE, AMYLASE in the last 168 hours. No results for input(s): AMMONIA in the last 168 hours. Coagulation Profile: Recent Labs  Lab 07/14/18 1842  INR 1.02   Cardiac Enzymes: No results for input(s): CKTOTAL, CKMB, CKMBINDEX, TROPONINI in the last 168 hours. BNP (last 3 results) No results for input(s): PROBNP in the last 8760 hours. HbA1C: No results for input(s): HGBA1C in the last 72 hours. CBG: Recent Labs  Lab 07/14/18 1932  GLUCAP 96   Lipid Profile: No results for input(s): CHOL, HDL, LDLCALC, TRIG, CHOLHDL, LDLDIRECT in the last 72 hours. Thyroid Function Tests: No results for input(s): TSH, T4TOTAL, FREET4, T3FREE, THYROIDAB in the last 72 hours. Anemia Panel: No results for input(s): VITAMINB12, FOLATE, FERRITIN, TIBC, IRON, RETICCTPCT in the last 72  hours. Urine analysis:    Component Value Date/Time   COLORURINE STRAW (A) 07/14/2018 2123   APPEARANCEUR CLEAR 07/14/2018 2123   LABSPEC 1.006 07/14/2018 2123   PHURINE 7.0 07/14/2018 2123   GLUCOSEU NEGATIVE 07/14/2018 2123   GLUCOSEU NEGATIVE 09/08/2016 1419   HGBUR NEGATIVE 07/14/2018 2123   BILIRUBINUR NEGATIVE 07/14/2018 2123   KETONESUR NEGATIVE 07/14/2018 2123   PROTEINUR NEGATIVE 07/14/2018 2123   UROBILINOGEN 0.2 09/08/2016 1419   NITRITE NEGATIVE 07/14/2018 2123   LEUKOCYTESUR NEGATIVE 07/14/2018 2123    Radiological Exams on Admission: Ct Head Wo Contrast  Result Date: 07/14/2018 CLINICAL DATA:  77 year old female with acute ataxia EXAM: CT HEAD WITHOUT CONTRAST TECHNIQUE: Contiguous axial images were obtained from the base of the skull through the vertex without intravenous contrast. COMPARISON:  06/14/2017 FINDINGS: Brain: No evidence of acute infarction, hemorrhage, hydrocephalus, extra-axial collection or mass lesion/mass effect. Mild  chronic small-vessel white matter ischemic changes again identified. LEFT temporal and basal ganglia encephalomalacia again noted. Vascular: Carotid atherosclerotic calcifications again identified. Skull: No acute abnormality Sinuses/Orbits: No acute abnormality Other: None IMPRESSION: 1. No evidence of acute intracranial abnormality. 2. Mild chronic small-vessel white matter ischemic changes and LEFT temporal/basal ganglia encephalomalacia. Electronically Signed   By: Harmon PierJeffrey  Hu M.D.   On: 07/14/2018 19:23   Dg Chest Port 1 View  Result Date: 07/14/2018 CLINICAL DATA:  Altered mental status. EXAM: PORTABLE CHEST 1 VIEW COMPARISON:  04/07/2017 FINDINGS: Lungs are adequately inflated without focal consolidation or effusion. Mild stable cardiomegaly. Remainder of the exam is unchanged. IMPRESSION: No acute cardiopulmonary disease. Mild stable cardiomegaly. Electronically Signed   By: Elberta Fortisaniel  Boyle M.D.   On: 07/14/2018 19:26    EKG:  Independently reviewed.  Assessment/Plan Principal Problem:   Acute encephalopathy Active Problems:   Hypothyroidism   Essential hypertension   Abnormal involuntary movement   CVA (cerebral vascular accident) (HCC)   Bradycardia   Autism spectrum disorder   Deaf    1. Acute encephalopathy - 1. DDx includes but not limited to: 1. Neurogenic - 1. Late onset seizure disorder from prior stroke last year. 2. Vascular dementia 2. Cardiogenic - 1. Symptomatic bradycardia, actually looks suspicious for sick sinus syndrome. 3. Toxic - 1. Does take klonopin but looks like she has been on some dose of this med for a while. 2. Other metabolic cause 4. Other: psychogenic, etc 2. Plan for initial work up ordered at this time - 1. MRI brain w/o contrast in AM.  Currently scheduling interpreter to be present between 8-9am to help with this. 2. EEG 3. Tele monitor 4. Check Mg, TSH, B12, Ammonia 5. If all of above comes back neg, consider neuro consult 6. Also consider cards consult for what looks suspiciously like sick sinus syndrome, especially if this persists off of any rate meds. 1. Looks like she was on beta blocker in past but was supposed to stop this according to Dr. Harlow AsaSean Ellison's June note when he noticed bradycardia during office visit. 2. HTN - Switch amlodipine to hydralazine (curbside rec of Dr. Algie CofferKadakia) to rule out the CCB as cause of bradycardia. 3. Hypothyroidism - Continue synthroid, check TSH 4. Bradycardia - tele monitor, no rate lowering meds, consider cards consult if persists as discussed above.  Also switching HTN meds as above. 5. AIM - continue PRN klonapin for now, but keep eye out for relation of timing of confusion to klonapin dosing 6. Autism spectrum disorder - chronic 7. Deaf - chronic  DVT prophylaxis: SCDs - given h/o ICH last year Code Status: Full Family Communication: Care givers at bedside Disposition Plan: TBD Consults called: None Admission status:  Admit to inpatient - IP status for encephalopathy work up.   Hillary BowGARDNER, JARED M. DO [facility] Pager [phone] Only works nights!  If 7AM-7PM, please contact the primary day team physician taking care of patient  [email or web address] Password [facility]  07/14/2018, 11:19 PM

## 2018-07-14 NOTE — ED Notes (Signed)
Dr Julian ReilGardner discussed having MRI in the morning. Called MRI to schedule and they noted 8-9 am in the order. The interpreter number is [phone].

## 2018-07-14 NOTE — ED Notes (Signed)
Reported brady to Dr Jeraldine LootsLockwood. Pt resting, will continue to monitor

## 2018-07-14 NOTE — ED Triage Notes (Signed)
[facility] reports Pt has seemed off like trying to drink with cap on. bp 180/100 has taken bp meds, cbg 91, 98% RA Possible right hip pain.

## 2018-07-15 ENCOUNTER — Inpatient Hospital Stay (HOSPITAL_COMMUNITY): Payer: Medicare Other

## 2018-07-15 ENCOUNTER — Ambulatory Visit (HOSPITAL_COMMUNITY): Payer: Medicare Other

## 2018-07-15 DIAGNOSIS — I63422 Cerebral infarction due to embolism of left anterior cerebral artery: Secondary | ICD-10-CM

## 2018-07-15 LAB — AMMONIA: AMMONIA: 24 umol/L (ref 9–35)

## 2018-07-15 LAB — ECHOCARDIOGRAM COMPLETE
HEIGHTINCHES: 63 in
Weight: 2021.18 oz

## 2018-07-15 LAB — RAPID URINE DRUG SCREEN, HOSP PERFORMED
Amphetamines: NOT DETECTED
BARBITURATES: NOT DETECTED
Benzodiazepines: NOT DETECTED
Cocaine: NOT DETECTED
Opiates: NOT DETECTED
Tetrahydrocannabinol: NOT DETECTED

## 2018-07-15 LAB — TSH: TSH: 10.419 u[IU]/mL — AB (ref 0.350–4.500)

## 2018-07-15 LAB — MAGNESIUM: MAGNESIUM: 1.9 mg/dL (ref 1.7–2.4)

## 2018-07-15 LAB — VITAMIN B12: Vitamin B-12: 567 pg/mL (ref 180–914)

## 2018-07-15 MED ORDER — ZOLPIDEM TARTRATE 5 MG PO TABS
5.0000 mg | ORAL_TABLET | Freq: Every evening | ORAL | Status: DC | PRN
  Administered 2018-07-15 – 2018-07-17 (×2): 5 mg via ORAL
  Filled 2018-07-15 (×2): qty 1

## 2018-07-15 MED ORDER — HYDRALAZINE HCL 25 MG PO TABS
25.0000 mg | ORAL_TABLET | Freq: Three times a day (TID) | ORAL | Status: DC
  Administered 2018-07-16 – 2018-07-17 (×6): 25 mg via ORAL
  Filled 2018-07-15 (×8): qty 1

## 2018-07-15 MED ORDER — PERFLUTREN LIPID MICROSPHERE
1.0000 mL | INTRAVENOUS | Status: AC | PRN
  Administered 2018-07-15: 2 mL via INTRAVENOUS
  Filled 2018-07-15: qty 10

## 2018-07-15 NOTE — Progress Notes (Signed)
B/P 162/110. MRI positive for CVA. Paged [facility] on call.

## 2018-07-15 NOTE — Progress Notes (Signed)
PT Cancellation Note  Patient Details Name: Laura [name] MRN: [id number] DOB: [birth year]   Cancelled Treatment:    Reason Eval/Treat Not Completed: Patient at procedure or test/unavailable Attempted to evaluate patient however she is currently at procedure and unavailable. Will follow and attempt to return if schedule/time allows.    Nedra HaiKristen Unger PT, DPT, CBIS  Supplemental Physical Therapist [facility]   Pager [phone]

## 2018-07-15 NOTE — Clinical Social Work Note (Signed)
Clinical Social Work Assessment  Patient Details  Name: Laura [name] MRN: [id number] Date of Birth: 1941-07-06  Date of referral:  07/15/18               Reason for consult:  Facility Placement                Permission sought to share information with:  Facility Sport and exercise psychologist, Family Supports Permission granted to share information::  Yes, Verbal Permission Granted  Name::     Lexicographer::  [employer] [facility]  Relationship::  Legal guardian  Sport and exercise psychologist Information:     Housing/Transportation Living arrangements for the past 2 months:  [place] of Information:  Patient, Medical Team, Guardian Patient Interpreter Needed:  Sign Language Criminal Activity/Legal Involvement Pertinent to Current Situation/Hospitalization:  No - Comment as needed Significant Relationships:  Other Family Members, Other(Comment) Lives with:  Self Do you feel safe going back to the place where you live?  Yes Need for family participation in patient care:  Yes (Comment)  Care giving concerns:  Patient from [facility] independent living and may not be safe to return back independent at discharge.   Social Worker assessment / plan:  CSW met with patient, patient's guardian, patient's cousin, and sign language interpreter at bedside to discuss concerns about discharge. CSW discussed options available and barriers, including that facilities do not offer sign language interpretation for patient's going to SNF. CSW acknowledged the ADA requirements, but that doesn't mean that a SNF will be obligated to offer a bed for the patient. CSW contacted [facility] to discuss possible admissions to ALF at return, and they do not have an immediate opening in the ALF at [facility]. CSW provided information to patient's guardian, and discussed possible options of hiring additional caregivers to assist the patient back home while awaiting placement at ALF. CSW to follow for PT  recommendations and assist with discharge planning.  Employment status:  Retired Forensic scientist:  Medicare PT Recommendations:  Not assessed at this time Information / Referral to community resources:     Patient/Family's Response to care:  Patient would like to return to [employer] [facility], if possible, she does not want to go to SNF. Patient's guardian would also prefer for the patient to return to [facility], if possible, but knows that she'll have to look into whatever options would be most safe for the patient.  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 Patient's guardian discussed how the patient had been at [facility] previously but the experience was not pleasant; they ended up bringing her back home and hiring additional private caregivers to care for her in her apartment and it went much better. Patient's guardian was understanding that [facility] has no immediate openings in the ALF and are awaiting to see how the patient does with therapies to make decisions on what the patient will need at discharge.  Emotional Assessment Appearance:  Appears stated age Attitude/Demeanor/Rapport:  Engaged Affect (typically observed):  Pleasant Orientation:  Oriented to Self, Oriented to Place, Oriented to  Time, Oriented to Situation Alcohol / Substance use:  Not Applicable Psych involvement (Current and /or in the community):  No (Comment)  Discharge Needs  Concerns to be addressed:  Care Coordination Readmission within the last 30 days:  No Current discharge risk:  Lives alone, Physical Impairment Barriers to Discharge:  Continued Medical Work up, [place], [place] 07/15/2018, 4:30 PM

## 2018-07-15 NOTE — Progress Notes (Signed)
EEG completed, results pending. 

## 2018-07-15 NOTE — Procedures (Signed)
  [facility] Rhaya Coale A. Gerilyn Pilgrimoonquah, MD     [email or web address]           HISTORY: The patient is a 77 year old female who has autism spectrum disorder.  She reported since with episodes of confusion, staring and shaking.  There is a history of seizures.  MEDICATIONS: Scheduled Meds: . [START ON 07/16/2018] hydrALAZINE  25 mg Oral Q8H  . levETIRAcetam  500 mg Oral BID  . levothyroxine  50 mcg Oral QAC breakfast   Continuous Infusions: . sodium chloride 125 mL/hr at 07/15/18 0729   PRN Meds:.acetaminophen **OR** acetaminophen, clonazePAM, ondansetron **OR** ondansetron (ZOFRAN) IV  Prior to Admission medications   Medication Sig Start Date End Date Taking? Authorizing Provider  alendronate (FOSAMAX) 70 MG tablet TAKE 1 TABLET BY MOUTH ONCE A WEEK .  TAKE  WITH  A  FULL  GLASS  OF  WATER  ON  AN  EMPTY  STOMACH Patient taking differently: Take 70 mg by mouth once a week. On Saturday 04/14/18  Yes Romero BellingEllison, Sean, MD  amLODipine (NORVASC) 10 MG tablet Take 1 tablet (10 mg total) by mouth daily. 04/13/17  Yes Osvaldo ShipperKrishnan, Gokul, MD  cholecalciferol (VITAMIN D) 1000 UNITS tablet Take 1,000 Units by mouth daily.   Yes [provider]  clonazePAM (KLONOPIN) 1 MG tablet Take 1 tablet (1 mg total) by mouth 3 (three) times daily as needed (tremor). 06/14/18  Yes Romero BellingEllison, Sean, MD  levETIRAcetam (KEPPRA) 500 MG tablet Take 1 tablet (500 mg total) by mouth 2 (two) times daily. 04/13/17  Yes Osvaldo ShipperKrishnan, Gokul, MD  levothyroxine (SYNTHROID, LEVOTHROID) 50 MCG tablet Take 1 tablet (50 mcg total) by mouth daily. 02/08/18  Yes Romero BellingEllison, Sean, MD  Multiple Vitamin (MULTIVITAMIN) tablet Take 1 tablet by mouth daily.     Yes [provider]  triamcinolone cream (KENALOG) 0.1 % APPLY  CREAM EXTERNALLY THREE TIMES DAILY AS NEEDED FOR ITCHING Patient not taking: Reported on 07/14/2018 07/28/16   Romero BellingEllison, Sean, MD      ANALYSIS: A 16 channel recording using standard 10 20 measurements is  conducted for 22 minutes.  The background activity is that of a beta are rhythm of 15 hertz bilaterally.  There is also beta activity observed in the frontal areas.  There is increased myogenic artifact seen in the frontal areas in particular.  Awake and drowsy architecture are documented.  Photic stimulation and hyperventilation not conducted.  There is no focal or lateralized slowing.  There is no epileptiform activity is observed.   IMPRESSION: 1.  This is a normal recording of awake and drowsy states.      Rhanda Lemire A. Gerilyn Pilgrimoonquah, M.D.  Diplomate, Biomedical engineerAmerican Board of Psychiatry and Neurology ( Neurology).

## 2018-07-15 NOTE — Progress Notes (Signed)
Patient arrived in the floor in a bed from ED at 0325 am this morning, Pt is deaf and needs interpreter on bedside , pt's care giver POA is on bedside with her, pt is alert and oriented x 2-3 , expressed and seemed of feeling fine and resting in a bed , pt is on tele monitor box no. 3w-03 , rhythms  SB-SR, and will continue to monitor closely.

## 2018-07-15 NOTE — Progress Notes (Signed)
  Echocardiogram 2D Echocardiogram has been performed.  [place] G Murielle Stang 07/15/2018, 11:59 AM

## 2018-07-15 NOTE — Progress Notes (Signed)
Pt off unit to MRI. P. Amo Jaleigh Mccroskey RN 

## 2018-07-15 NOTE — Progress Notes (Signed)
PROGRESS NOTE  Hewitt Shortsancy M Wingate [id number] DOB: Nov 05, 1941 DOA: 07/14/2018 PCP: Romero BellingEllison, Sean, MD  HPI/Recap of past 24 hours:  Baseline deafness From independent living with weakness and encephalopathyx2weeks Bradycardia  Family and sign language interpreter at bedside  Patient reports she has not been taking her synthroid meds  She reports now she is feeling fine, she denies pain, no acute complaints   Assessment/Plan: Principal Problem:   Acute encephalopathy Active Problems:   Hypothyroidism   Essential hypertension   Abnormal involuntary movement   CVA (cerebral vascular accident) (HCC)   Bradycardia   Autism spectrum disorder   Deaf   Encephalopathy  eeg negative.  uds negative Mri pending echocardiogram pending No source of infection  Bradycardia: bp stable Keep on tele echopending  HTN: Will allow permissive hypertension until mri rule out actue cva  tsh elevated Patient reports she has not been taking synthroid , restart home does synthroid  H/o CVA and subdural hematoma, was started on keppra for seizure prevention in 03/2017  Autism/deaf -from independent living Use sign language   Code Status: full  Family Communication: patient and family at bedside  Disposition Plan: pending   Consultants:  none  Procedures:  none  Antibiotics:  none   Objective: BP (!) 167/84 (BP Location: Right Arm)   Pulse 64   Temp (!) 97.4 F (36.3 C) (Oral)   Resp 18   Ht 5\' 3"  (1.6 m)   Wt 57.3 kg   SpO2 100%   BMI 22.38 kg/m   Intake/Output Summary (Last 24 hours) at 07/15/2018 0755 Last data filed at 07/15/2018 0500 Gross per 24 hour  Intake 1624.99 ml  Output -  Net 1624.99 ml   Filed Weights   07/14/18 1813  Weight: 57.3 kg    Exam: Patient is examined daily including today on 07/15/2018, exams remain the same as of yesterday except that has changed    General:  NAD, able to communicate through sign  language  Cardiovascular: RRR  Respiratory: CTABL  Abdomen: Soft/ND/NT, positive BS  Musculoskeletal: No Edema  Neuro: alert, oriented   Data Reviewed: Basic Metabolic Panel: Recent Labs  Lab 07/14/18 1842 07/14/18 2350  NA 137  --   K 4.2  --   CL 100  --   CO2 30  --   GLUCOSE 102*  --   BUN 18  --   CREATININE 0.83  --   CALCIUM 8.6*  --   MG  --  1.9   Liver Function Tests: Recent Labs  Lab 07/14/18 1842  AST 25  ALT 14  ALKPHOS 57  BILITOT 0.6  PROT 6.6  ALBUMIN 3.5   No results for input(s): LIPASE, AMYLASE in the last 168 hours. Recent Labs  Lab 07/14/18 2350  AMMONIA 24   CBC: Recent Labs  Lab 07/14/18 1842  WBC 7.5  NEUTROABS 4.7  HGB 14.4  HCT 45.8  MCV 94.2  PLT 223   Cardiac Enzymes:   No results for input(s): CKTOTAL, CKMB, CKMBINDEX, TROPONINI in the last 168 hours. BNP (last 3 results) No results for input(s): BNP in the last 8760 hours.  ProBNP (last 3 results) No results for input(s): PROBNP in the last 8760 hours.  CBG: Recent Labs  Lab 07/14/18 1932  GLUCAP 96    No results found for this or any previous visit (from the past 240 hour(s)).   Studies: Ct Head Wo Contrast  Result Date: 07/14/2018 CLINICAL DATA:  77 year old female with acute  ataxia EXAM: CT HEAD WITHOUT CONTRAST TECHNIQUE: Contiguous axial images were obtained from the base of the skull through the vertex without intravenous contrast. COMPARISON:  06/14/2017 FINDINGS: Brain: No evidence of acute infarction, hemorrhage, hydrocephalus, extra-axial collection or mass lesion/mass effect. Mild chronic small-vessel white matter ischemic changes again identified. LEFT temporal and basal ganglia encephalomalacia again noted. Vascular: Carotid atherosclerotic calcifications again identified. Skull: No acute abnormality Sinuses/Orbits: No acute abnormality Other: None IMPRESSION: 1. No evidence of acute intracranial abnormality. 2. Mild chronic small-vessel white  matter ischemic changes and LEFT temporal/basal ganglia encephalomalacia. Electronically Signed   By: Harmon Pier M.D.   On: 07/14/2018 19:23   Dg Chest Port 1 View  Result Date: 07/14/2018 CLINICAL DATA:  Altered mental status. EXAM: PORTABLE CHEST 1 VIEW COMPARISON:  04/07/2017 FINDINGS: Lungs are adequately inflated without focal consolidation or effusion. Mild stable cardiomegaly. Remainder of the exam is unchanged. IMPRESSION: No acute cardiopulmonary disease. Mild stable cardiomegaly. Electronically Signed   By: Elberta Fortis M.D.   On: 07/14/2018 19:26    Scheduled Meds: . [START ON 07/16/2018] hydrALAZINE  25 mg Oral Q8H  . levETIRAcetam  500 mg Oral BID  . levothyroxine  50 mcg Oral QAC breakfast    Continuous Infusions: . sodium chloride 125 mL/hr at 07/15/18 4098     Time spent: >43mins mins, need sign language interpreter  I have personally reviewed and interpreted on  07/15/2018 daily labs, tele strips, imagings as discussed above under date review session and assessment and plans.  I reviewed all nursing notes, pharmacy notes, consultant notes,  vitals, pertinent old records  I have discussed plan of care as described above with RN , patient and family on 07/15/2018   Albertine Grates MD, PhD  [facility] Pager [phone]. If 7PM-7AM, please contact night-coverage at [email or web address], password [facility] 07/15/2018, 7:55 AM  LOS: 1 day

## 2018-07-16 ENCOUNTER — Inpatient Hospital Stay (HOSPITAL_COMMUNITY): Payer: Medicare Other

## 2018-07-16 ENCOUNTER — Encounter (HOSPITAL_COMMUNITY): Payer: Self-pay | Admitting: Radiology

## 2018-07-16 ENCOUNTER — Encounter (HOSPITAL_COMMUNITY): Payer: Self-pay

## 2018-07-16 ENCOUNTER — Inpatient Hospital Stay (HOSPITAL_COMMUNITY): Payer: Self-pay

## 2018-07-16 DIAGNOSIS — I639 Cerebral infarction, unspecified: Secondary | ICD-10-CM

## 2018-07-16 LAB — GLUCOSE, CAPILLARY
GLUCOSE-CAPILLARY: 111 mg/dL — AB (ref 70–99)
GLUCOSE-CAPILLARY: 186 mg/dL — AB (ref 70–99)
Glucose-Capillary: 137 mg/dL — ABNORMAL HIGH (ref 70–99)

## 2018-07-16 LAB — BASIC METABOLIC PANEL
ANION GAP: 8 (ref 5–15)
BUN: 15 mg/dL (ref 8–23)
CALCIUM: 8.4 mg/dL — AB (ref 8.9–10.3)
CO2: 26 mmol/L (ref 22–32)
CREATININE: 0.82 mg/dL (ref 0.44–1.00)
Chloride: 105 mmol/L (ref 98–111)
Glucose, Bld: 122 mg/dL — ABNORMAL HIGH (ref 70–99)
Potassium: 4.2 mmol/L (ref 3.5–5.1)
Sodium: 139 mmol/L (ref 135–145)

## 2018-07-16 LAB — LIPID PANEL
Cholesterol: 268 mg/dL — ABNORMAL HIGH (ref 0–200)
HDL: 42 mg/dL (ref >40)
LDL Cholesterol: 168 mg/dL — ABNORMAL HIGH (ref 0–99)
TRIGLYCERIDES: 291 mg/dL — AB (ref <150)
Total CHOL/HDL Ratio: 6.4 RATIO
VLDL: 58 mg/dL — ABNORMAL HIGH (ref 0–40)

## 2018-07-16 LAB — HEMOGLOBIN A1C
Hgb A1c MFr Bld: 6.7 % — ABNORMAL HIGH (ref 4.8–5.6)
Mean Plasma Glucose: 145.59 mg/dL

## 2018-07-16 LAB — URINE CULTURE
Culture: <10000 — AB
Special Requests: NORMAL

## 2018-07-16 MED ORDER — ASPIRIN 81 MG PO CHEW
81.0000 mg | CHEWABLE_TABLET | Freq: Every day | ORAL | Status: DC
  Administered 2018-07-16: 81 mg via ORAL
  Filled 2018-07-16: qty 1

## 2018-07-16 MED ORDER — ATORVASTATIN CALCIUM 40 MG PO TABS: 40.0000 mg | ORAL_TABLET | Freq: Every day | ORAL | Status: DC

## 2018-07-16 MED ORDER — IOPAMIDOL (ISOVUE-370) INJECTION 76%
50.0000 mL | Freq: Once | INTRAVENOUS | Status: AC | PRN
  Administered 2018-07-16: 50 mL via INTRAVENOUS

## 2018-07-16 MED ORDER — CLONAZEPAM 0.25 MG PO TBDP
0.2500 mg | ORAL_TABLET | Freq: Every day | ORAL | Status: DC
  Administered 2018-07-16 – 2018-07-17 (×2): 0.25 mg via ORAL
  Filled 2018-07-16 (×3): qty 1

## 2018-07-16 MED ORDER — IOPAMIDOL (ISOVUE-370) INJECTION 76%
INTRAVENOUS | Status: AC
  Filled 2018-07-16: qty 50

## 2018-07-16 MED ORDER — ATORVASTATIN CALCIUM 80 MG PO TABS
80.0000 mg | ORAL_TABLET | Freq: Every day | ORAL | Status: DC
  Administered 2018-07-16 – 2018-07-17 (×2): 80 mg via ORAL
  Filled 2018-07-16 (×2): qty 1

## 2018-07-16 MED ORDER — HYDRALAZINE HCL 20 MG/ML IJ SOLN
10.0000 mg | Freq: Four times a day (QID) | INTRAMUSCULAR | Status: DC | PRN
  Administered 2018-07-17 – 2018-07-18 (×2): 10 mg via INTRAVENOUS
  Filled 2018-07-16 (×3): qty 1

## 2018-07-16 NOTE — Evaluation (Addendum)
Physical Therapy Evaluation Patient Details Name: Laura [name] MRN: [id number] DOB: January 13, 1941 Today's Date: 07/16/2018   History of Present Illness  77 y.o. female admitted with 2 week history of encephalopathy found to have acute CVA and bradycardia. PMH includes but not limited to: Autism, Deafness, prior CVA, tremor, hypothyroidism, HTN, HLD.     Clinical Impression  Pt admitted with above diagnosis. Pt currently with functional limitations due to the deficits listed below (see PT Problem List). PTA, pt living in an independent living facility by herself, ambulating around property with intermittent use of RW. History obtained from POA/Caretaker who speaks sign. She says that patient has not been herself last 2 weeks. Pt has a history of unwittnessed falls prior to 2 week decline in function as well. Upon eval patient ambulating to bathroom and back with min A to provide stability, with poor safety and increased risk of falling. Pt will benefit from skilled PT to increase their independence and safety with mobility to allow discharge to the venue listed below.       Follow Up Recommendations CIR    Equipment Recommendations  (TBD)    Recommendations for Other Services Rehab consult;OT consult     Precautions / Restrictions Precautions Precautions: Fall Restrictions Weight Bearing Restrictions: No      Mobility  Bed Mobility Overal bed mobility: Needs Assistance Bed Mobility: Supine to Sit;Sit to Supine     Supine to sit: Min assist Sit to supine: Min assist      Transfers Overall transfer level: Needs assistance Equipment used: Rolling walker (2 wheeled) Transfers: Sit to/from Stand Sit to Stand: Min assist         General transfer comment: pt with poor safety awareness, able to stand with mininmal assisatnce with apparnet balance deficits ambulate to bathroom and return to bed   Ambulation/Gait Ambulation/Gait assistance: Min guard Gait Distance (Feet): 20  Feet Assistive device: Rolling walker (2 wheeled) Gait Pattern/deviations: Step-to pattern Gait velocity: decreased   General Gait Details: pt ambulating with poor awarness and obstacale avoidance with RW. min guard to min A for stability at times but able to walk to bathroom this visit and back to bed.   Stairs            Wheelchair Mobility    Modified Rankin (Stroke Patients Only)       Balance Overall balance assessment: Needs assistance   Sitting balance-Leahy Scale: Fair       Standing balance-Leahy Scale: Poor                               Pertinent Vitals/Pain Pain Assessment: No/denies pain    Home Living Family/patient expects to be discharged to:: Inpatient rehab                      Prior Function Level of Independence: Independent with assistive device(s)         Comments: pt living in independent living facility, intermittent use of RW for household ambulation. Pt has hx of falls when staff finds her after being absent at breakfast. Family and caregiver check in.      Hand Dominance        Extremity/Trunk Assessment   Upper Extremity Assessment Upper Extremity Assessment: Difficult to assess due to impaired cognition    Lower Extremity Assessment Lower Extremity Assessment: Difficult to assess due to impaired cognition  Communication   Communication: Deaf;Expressive difficulties;Receptive difficulties  Cognition Arousal/Alertness: Awake/alert Behavior During Therapy: Impulsive Overall Cognitive Status: Impaired/Different from baseline Area of Impairment: Orientation;Following commands;Safety/judgement;Awareness;Memory                               General Comments: Patient is autistic, however has had a change in cognitve status prior 2 weeks per sign interepter and caretaker. can follow some commands but very impulsive and poor safety awareness.       General Comments General comments (skin  integrity, edema, etc.): extensive discussion with family over history and follow up recs and safety at home.     Exercises     Assessment/Plan    PT Assessment Patient needs continued PT services  PT Problem List Decreased strength;Decreased range of motion;Decreased activity tolerance;Decreased balance;Decreased mobility;Decreased coordination;Decreased cognition;Decreased knowledge of use of DME       PT Treatment Interventions Gait training;DME instruction;Stair training;Functional mobility training;Therapeutic activities;Therapeutic exercise;Balance training;Neuromuscular re-education;Cognitive remediation;Patient/family education    PT Goals (Current goals can be found in the Care Plan section)  Acute Rehab PT Goals Patient Stated Goal: non stated PT Goal Formulation: With patient/family Time For Goal Achievement: 07/30/18 Potential to Achieve Goals: Fair    Frequency Min 3X/week   Barriers to discharge        Co-evaluation               AM-PAC PT "6 Clicks" Daily Activity  Outcome Measure Difficulty turning over in bed (including adjusting bedclothes, sheets and blankets)?: Unable Difficulty moving from lying on back to sitting on the side of the bed? : Unable Difficulty sitting down on and standing up from a chair with arms (e.g., wheelchair, bedside commode, etc,.)?: Unable Help needed moving to and from a bed to chair (including a wheelchair)?: A Little Help needed walking in hospital room?: A Lot Help needed climbing 3-5 steps with a railing? : A Lot 6 Click Score: 10    End of Session Equipment Utilized During Treatment: Gait belt Activity Tolerance: Patient tolerated treatment well Patient left: in bed;with call bell/phone within reach;with bed alarm set;with family/visitor present Nurse Communication: Mobility status PT Visit Diagnosis: Unsteadiness on feet (R26.81);History of falling (Z91.81);Muscle weakness (generalized) (M62.81);Difficulty in  walking, not elsewhere classified (R26.2)    Time: 1800-1830 PT Time Calculation (min) (ACUTE ONLY): 30 min   Charges:   PT Evaluation $PT Eval High Complexity: 1 High PT Treatments $Gait Training: 8-22 mins       Etta Grandchild, PT, DPT Acute Rehab Services Pager: [phone]    Etta Grandchild 07/16/2018, 11:15 PM

## 2018-07-16 NOTE — Consult Note (Addendum)
Neurology Consultation  Reason for Consult: Acute stroke  Referring Physician: Dr. Roda ShuttersXu  CC: AMS, gait difficulty, lightheadedness, dizziness   History is obtained from: Chart review, medical power of attorney, caregiver, patient. Consult is completed with the assistance of sign-language of interpreter.   HPI: Laura [name] is a 77 y.o. 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 Presented to ER on 07/14/18 for AMS. Per patient's medical POA and caregiver at the bedside her cognitive and language impairment symptoms started about 2 weeks ago abruptly and have gradually worsened overall on top of a waxing and waning course up to last Thursday, when symptoms started to become even more prominent. Patient was noted to display confusion, inability to concentrate, misspelling during signing, gait difficulty, lightheadedness, dizziness, syncope and at least 1 fall. Apparently there was some report of possible seizure (occasional staring off) activity reported by the staff and confirmed by MPOA.  CT brain was negative for acute stroke but did show some evidence of carotid plaque, chronic small vessel changes and encephalomalacia. MRI brain revealed left temporal lobe encephalomalacia secondary to a remote left temporal lobe hemorrhage and a chronic left lateral lenticulostriate infarct versus old hemorrhage as well as a cluster of small acute infarcts in the distal left ACA versus MCA/ACA watershed territory. Neurology was consulted for further evaluation.   Patient does not have a history of seizure but was placed on an AED over a year ago as prophylaxis post ICH/SDH: Keppra 500 mg po BID. Patient also presented with elevated TSH of 10.41 this admission. Patient resides in a independent living facility. It is unknown if she has been properly taking her medications during this period of gradually worsening confusion and  altered cognition.   ROS: A 14 point ROS was performed and is negative except as noted in the HPI.   Past Medical History:  Diagnosis Date  . Anxiety state, unspecified 10/10/2007  . Arthritis   . ASYMPTOMATIC POSTMENOPAUSAL STATUS 04/15/2009  . Cataract    removed both eyes   . Deaf   . Dermatitis   . Headache(784.0) 08/27/2008  . HYPERLIPIDEMIA 06/22/2007   rx refused  . HYPERTENSION 05/07/2008  . HYPOTHYROIDISM 04/15/2009  . Mild mental retardation   . 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 RESTLESS LEG SYNDROME 10/01/2008  . TACHYCARDIA 08/27/2008  . TREMOR 11/21/2007  . TRIGGER FINGER 10/30/2009   3-Moderate disability-requires help but walks WITHOUT assistance Stroke risk- HTN, HLD, History of stoke ICH/SDH  Family History  Problem Relation Age of Onset  . Thyroid disease Neg Hx   . Colon cancer Neg Hx   . Colon polyps Neg Hx   . Rectal cancer Neg Hx   . Stomach cancer Neg Hx     Social History:   reports that she has never smoked. She has never used smokeless tobacco. She reports that she does not drink alcohol or use drugs.  Medications  Current Facility-Administered Medications:  .  [COMPLETED] sodium chloride 0.9 % bolus 1,000 mL, 1,000 mL, Intravenous, Once, Stopped at 07/14/18 2028 **AND** 0.9 %  sodium chloride infusion, , Intravenous, Continuous, Gerhard MunchLockwood, Robert, MD, Last Rate: 125 mL/hr at 07/16/18 0210, 1,000 mL at 07/16/18 0210 .  acetaminophen (TYLENOL) tablet 650 mg, 650 mg, Oral, Q6H PRN, 650 mg at 07/15/18 1622 **OR** acetaminophen (TYLENOL) suppository 650 mg, 650 mg, Rectal, Q6H PRN, Hillary BowGardner, Jared M, DO .  aspirin chewable tablet 81 mg,  81 mg, Oral, Daily, Albertine Grates, MD .  atorvastatin (LIPITOR) tablet 40 mg, 40 mg, Oral, q1800, Albertine Grates, MD .  clonazePAM Scarlette Calico) tablet 1 mg, 1 mg, Oral, TID PRN, Hillary Bow, DO, 1 mg at 07/15/18 1622 .  hydrALAZINE (APRESOLINE) tablet 25 mg, 25 mg, Oral, Q8H, Gardner, Jared M, DO, 25 mg at 07/16/18 1610 .   levETIRAcetam (KEPPRA) tablet 500 mg, 500 mg, Oral, BID, Hillary Bow, DO, 500 mg at 07/15/18 2304 .  levothyroxine (SYNTHROID, LEVOTHROID) tablet 50 mcg, 50 mcg, Oral, QAC breakfast, Hillary Bow, DO, 50 mcg at 07/16/18 [phone] .  ondansetron (ZOFRAN) tablet 4 mg, 4 mg, Oral, Q6H PRN **OR** ondansetron (ZOFRAN) injection 4 mg, 4 mg, Intravenous, Q6H PRN, Julian Reil, Jared M, DO .  zolpidem (AMBIEN) tablet 5 mg, 5 mg, Oral, QHS PRN, Albertine Grates, MD, 5 mg at 07/15/18 2304   Exam: Current vital signs: BP (!) 153/77   Pulse (!) 116   Temp 98 F (36.7 C) (Oral)   Resp 18   Ht 5\' 3"  (1.6 m)   Wt 57.3 kg   SpO2 99%   BMI 22.38 kg/m  Vital signs in last 24 hours: Temp:  [97.5 F (36.4 C)-98.2 F (36.8 C)] 98 F (36.7 C) (08/24 0815) Pulse Rate:  [83-116] 116 (08/24 0815) Resp:  [17-18] 18 (08/24 0815) BP: (137-170)/(77-110) 153/77 (08/24 0815) SpO2:  [96 %-100 %] 99 % (08/24 0815)  GENERAL: Awake, alert some mild confusion  HEENT: - Normocephalic and atraumatic, dry mm, no LN++,  baseline vision impairment  LUNGS - Clear to auscultation bilaterally with no wheezes CV -  RRR,  ABDOMEN - Soft, nontender Ext: warm, well perfused, intact peripheral pulses, 0 edema  NEURO:  Mental Status: Alert to self, Birthday, Month, year, situation, unaware of location  Language: speech is verbally mute as patient is deaf but she can sign. In this context, she displays signs of dysgraphia through sign language - misspells words during signing (e.g spelling "Leather" while attempting to spell "Feather"). Also with an expressive aphasia component as only able to correctly name 3/5 pictures. Repetition and fluency intact. Able top follow commands, however per her associates based on her sign language changes there is a mild component of receptive aphasia. Cranial Nerves: PERRL. EOMI, unable to check visual fields d/t to baseline vision abnormality, no facial asymmetry, facial sensation intact, hearing  intact, tongue/uvula/soft palate midline, normal  sternocleidomastoid and trapezius muscle strength. No evidence of tongue atrophy or fibrillations  Motor: moves all extremities. Tone: Is normal and bulk is normal  Sensation- intact  Coordination: Grossly intact  Gait- use of walker slight mild buckling of RLE  NIHSS: 2 - for language impairments  Labs I have reviewed labs in epic and the results pertinent to this consultation are: Lipid panel- Hyperlipidemia LDL-C 168, Cholesterol 268 (improved)- last year over 400, Hypertriglyceridemia,  Triglyceride elevated 291  TSH- 10.419   CBC    Component Value Date/Time   WBC 7.5 07/14/2018 1842   RBC 4.86 07/14/2018 1842   HGB 14.4 07/14/2018 1842   HCT 45.8 07/14/2018 1842   PLT 223 07/14/2018 1842   MCV 94.2 07/14/2018 1842   MCH 29.6 07/14/2018 1842   MCHC 31.4 07/14/2018 1842   RDW 12.6 07/14/2018 1842   LYMPHSABS 1.7 07/14/2018 1842   MONOABS 0.8 07/14/2018 1842   EOSABS 0.2 07/14/2018 1842   BASOSABS 0.1 07/14/2018 1842    CMP     Component Value Date/Time  NA 139 07/16/2018 0341   NA 134 (A) 04/20/2017   K 4.2 07/16/2018 0341   CL 105 07/16/2018 0341   CO2 26 07/16/2018 0341   GLUCOSE 122 (H) 07/16/2018 0341   BUN 15 07/16/2018 0341   BUN 13 04/20/2017   CREATININE 0.82 07/16/2018 0341   CALCIUM 8.4 (L) 07/16/2018 0341   PROT 6.6 07/14/2018 1842   ALBUMIN 3.5 07/14/2018 1842   AST 25 07/14/2018 1842   ALT 14 07/14/2018 1842   ALKPHOS 57 07/14/2018 1842   BILITOT 0.6 07/14/2018 1842   GFRNONAA >60 07/16/2018 0341   GFRAA >60 07/16/2018 0341    Lipid Panel     Component Value Date/Time   CHOL 268 (H) 07/16/2018 0341   TRIG 291 (H) 07/16/2018 0341   HDL 42 07/16/2018 0341   CHOLHDL 6.4 07/16/2018 0341   VLDL 58 (H) 07/16/2018 0341   LDLCALC 168 (H) 07/16/2018 0341   LDLDIRECT 95.0 09/08/2016 1419     Imaging I have reviewed the images obtained:  CT-scan of the brain 1. No evidence of acute  intracranial abnormality. 2. Mild chronic small-vessel white matter ischemic changes and LEFT temporal/basal ganglia encephalomalacia. Carotid atherosclerotic calcifications  MRI examination of the brain 1. 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 2. Remote left temporal lobe hemorrhage and left lateral lenticulostriate infarct.  2D Echo - Left ventricle: The cavity size was normal. Systolic function was   normal. The estimated ejection fraction was in the range of 55%   to 60%. Wall motion was normal; there were no regional wall   motion abnormalities. Doppler parameters are consistent with   abnormal left ventricular relaxation (grade 1 diastolic   dysfunction). Doppler parameters are consistent with elevated   mean left atrial filling pressure. - Left atrium: The atrium was mildly dilated.  Assessment:  Laura [name] is a 77 y.o. female with a PMH of significant of autism spectrum disorder, deafness, HTN, ICH in May 2018, hypothyroidism, chronic tremor presented to ER on 07/14/18 with AMS.   1. MRI brain revealed left temporal lobe encephalomalacia secondary to a remote left temporal lobe hemorrhage and a chronic left lateral lenticulostriate infarct versus old hemorrhage as well as a cluster of small acute infarcts in the distal left ACA versus MCA/ACA watershed territory.  2. The old left temporal lobe encephalomalacia explains her dysphasia.  3. Right lower extremity weakness during ambulation with buckling is explained by the subacute left ACA vs ACA/MCA watershed infarctions.  4. Stroke etiology DDx: Secondary to carotid disease, cardioembolic and watershed secondary to proximal stenosis plus hypotension resulting in hypoperfusion.  5. Patient will also need to ruled out for Afib given evidence of L atrial dilation and new stroke 6. Spells of speech arrest are felt most likely to be behavioral. Although left temporal lobe encephalomalacia presents an  increased risk for seizure, EEG yesterday was normal. Repeat EEG obtained today also shows no electrographic seizure activity on my preliminary review.  7. Hypothyroidism may be contributing to her AMS. Suspect medication noncompliance at home.   Recommendations 1. CTA head/ neck to assess for arthrosclerotic narrowing sufficient to predispose to watershed infarction on the left.  2. ASA 81 mg po daily. In this context use ASA with caution as there is evidence for remote left lenticulostriate ICH versus old ischemic stroke with hemorrhagic conversion. Overall, benefits of ASA are felt to outweigh risks  3. Frequent neuro checks 4.Continue Atorvastatin 80 mg/other high intensity statin Hyperlipidemia  LDL-C 168, Cholesterol 268 (improved)- last year over 400, Hypertriglyceridemia,  Triglyceride elevated 291  5. BP goal: 140- 160. Hydrate well and avoid medications that would predispose to acute hypotensive episodes given possible watershed infarct on the left.   6. Diabetic-Hgb AIC 6.7 - Diabetes education beneficial  7. Telemetry monitoring- monitor for afib- no hx of, but evidence of L atrial dilation on patients echo  8. Continue Seizure medications from home - On Keppra 500 mg BID 9. Monitor for seizures and use precaution  10. Medical management of hypothyroidism  11. More stringent observation for medication compliance at home is recommended as she most likely has been noncompliant with her synthroid regimen  # please page stroke NP  Or  PA  Or MD from 8am -4 pm  as this patient from this time will be  followed by the stroke.   You can look them up on [email or web address]  Password [email or web address]  [email or web address] @ Brunetta Jeans NP, Neuro  I have seen and examined the patient. I have amended the assessment and recommendations above.  Electronically signed: Dr. Caryl Pina

## 2018-07-16 NOTE — Procedures (Signed)
  [facility] Laura Ranes A. Gerilyn Pilgrimoonquah, MD     [email or web address]           HISTORY: The patient is a 77 year old female who has autism spectrum disorder.  She reported since with episodes of confusion, staring and shaking. Because of repeat shaking a repeat EEG is ordered. The study is being to evaluate for seizures.   MEDICATIONS: Scheduled Meds: . aspirin  81 mg Oral Daily  . atorvastatin  80 mg Oral q1800  . clonazePAM  0.25 mg Oral QHS  . hydrALAZINE  25 mg Oral Q8H  . iopamidol      . levETIRAcetam  500 mg Oral BID  . levothyroxine  50 mcg Oral QAC breakfast   Continuous Infusions: . sodium chloride 125 mL/hr at 07/16/18 1053   PRN Meds:.acetaminophen **OR** acetaminophen, clonazePAM, ondansetron **OR** ondansetron (ZOFRAN) IV, zolpidem  Prior to Admission medications   Medication Sig Start Date End Date Taking? Authorizing Provider  alendronate (FOSAMAX) 70 MG tablet TAKE 1 TABLET BY MOUTH ONCE A WEEK .  TAKE  WITH  A  FULL  GLASS  OF  WATER  ON  AN  EMPTY  STOMACH Patient taking differently: Take 70 mg by mouth once a week. On Saturday 04/14/18  Yes Romero BellingEllison, Sean, MD  amLODipine (NORVASC) 10 MG tablet Take 1 tablet (10 mg total) by mouth daily. 04/13/17  Yes Osvaldo ShipperKrishnan, Gokul, MD  cholecalciferol (VITAMIN D) 1000 UNITS tablet Take 1,000 Units by mouth daily.   Yes [provider]  clonazePAM (KLONOPIN) 1 MG tablet Take 1 tablet (1 mg total) by mouth 3 (three) times daily as needed (tremor). 06/14/18  Yes Romero BellingEllison, Sean, MD  levETIRAcetam (KEPPRA) 500 MG tablet Take 1 tablet (500 mg total) by mouth 2 (two) times daily. 04/13/17  Yes Osvaldo ShipperKrishnan, Gokul, MD  levothyroxine (SYNTHROID, LEVOTHROID) 50 MCG tablet Take 1 tablet (50 mcg total) by mouth daily. 02/08/18  Yes Romero BellingEllison, Sean, MD  Multiple Vitamin (MULTIVITAMIN) tablet Take 1 tablet by mouth daily.     Yes [provider]  triamcinolone cream (KENALOG) 0.1 % APPLY  CREAM EXTERNALLY THREE TIMES DAILY AS NEEDED  FOR ITCHING Patient not taking: Reported on 07/14/2018 07/28/16   Romero BellingEllison, Sean, MD      ANALYSIS: A 16 channel recording using standard 10 20 measurements is conducted for 21 minutes. There is a dominant posterior rhythm of 10 Hz which attenuates with eye opening. There is beta activity seen over the frontal fields. Awake and drowsy activities are seen. Photic stimulation and hyperventilation are not conducted. There is no focal slowing, lateralized slowing or epileptiform activity.   IMPRESSION: This is a normal recording of the awake and drowsy states.       Judye Lorino A. Gerilyn [name], M.D.  Diplomate, Biomedical engineerAmerican Board of Psychiatry and Neurology ( Neurology).

## 2018-07-16 NOTE — Consult Note (Addendum)
Cardiology Consultation:   Patient ID: Laura [name]; [id number]; October 16, 1941   Admit date: 07/14/2018 Date of Consult: 07/16/2018  Primary Care Provider: Romero Belling, MD Primary Cardiologist: No primary care provider on file. none Primary Electrophysiologist:  none   Patient Profile:   Laura [name] is a 77 y.o. female with a hx of stroke who is being seen today for the evaluation of bradycardia at the request of Dr.Xu.  History of Present Illness:   [name] is a very pleasant 77 year old woman who is deaf and has a history of stroke.  She has diabetes and is followed by Dr. [name].  The patient was admitted to the hospital with neurological symptoms, and MRI scanning demonstrated a new stroke.  She according to her caretaker has had some changes in personality as well as cognition over the past few weeks.  She is never had syncope.  There is a question of some dizziness.  The history is provided with the aid of her deaf interpreter.  The patient denies chest pain or shortness of breath.  Review of her cardiac monitor demonstrates sinus rhythm and sinus tachycardia with also periods of sinus bradycardia, PACs, some of which are nonconducted, and probable AV [employer] block.  Past Medical History:  Diagnosis Date  . Anxiety state, unspecified 10/10/2007  . Arthritis   . ASYMPTOMATIC POSTMENOPAUSAL STATUS 04/15/2009  . Cataract    removed both eyes   . Deaf   . Dermatitis   . Headache(784.0) 08/27/2008  . HYPERLIPIDEMIA 06/22/2007   rx refused  . HYPERTENSION 05/07/2008  . HYPOTHYROIDISM 04/15/2009  . Mild mental retardation   . 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 RESTLESS LEG SYNDROME 10/01/2008  . TACHYCARDIA 08/27/2008  . TREMOR 11/21/2007  . TRIGGER FINGER 10/30/2009    Past Surgical History:  Procedure Laterality Date  . CATARACT EXTRACTION, BILATERAL    . COLONOSCOPY       Home Medications:  Prior to Admission medications   Medication Sig Start Date End  Date Taking? Authorizing Provider  alendronate (FOSAMAX) 70 MG tablet TAKE 1 TABLET BY MOUTH ONCE A WEEK .  TAKE  WITH  A  FULL  GLASS  OF  WATER  ON  AN  EMPTY  STOMACH Patient taking differently: Take 70 mg by mouth once a week. On Saturday 04/14/18  Yes Romero Belling, MD  amLODipine (NORVASC) 10 MG tablet Take 1 tablet (10 mg total) by mouth daily. 04/13/17  Yes Osvaldo Shipper, MD  cholecalciferol (VITAMIN D) 1000 UNITS tablet Take 1,000 Units by mouth daily.   Yes [provider]  clonazePAM (KLONOPIN) 1 MG tablet Take 1 tablet (1 mg total) by mouth 3 (three) times daily as needed (tremor). 06/14/18  Yes Romero Belling, MD  levETIRAcetam (KEPPRA) 500 MG tablet Take 1 tablet (500 mg total) by mouth 2 (two) times daily. 04/13/17  Yes Osvaldo Shipper, MD  levothyroxine (SYNTHROID, LEVOTHROID) 50 MCG tablet Take 1 tablet (50 mcg total) by mouth daily. 02/08/18  Yes Romero Belling, MD  Multiple Vitamin (MULTIVITAMIN) tablet Take 1 tablet by mouth daily.     Yes [provider]  triamcinolone cream (KENALOG) 0.1 % APPLY  CREAM EXTERNALLY THREE TIMES DAILY AS NEEDED FOR ITCHING Patient not taking: Reported on 07/14/2018 07/28/16   Romero Belling, MD    Inpatient Medications: Scheduled Meds: . aspirin  81 mg Oral Daily  . atorvastatin  80 mg Oral q1800  . hydrALAZINE  25 mg Oral Q8H  .  levETIRAcetam  500 mg Oral BID  . levothyroxine  50 mcg Oral QAC breakfast   Continuous Infusions: . sodium chloride 125 mL/hr at 07/16/18 1053   PRN Meds: acetaminophen **OR** acetaminophen, clonazePAM, ondansetron **OR** ondansetron (ZOFRAN) IV, zolpidem  Allergies:   No Known Allergies  Social History:   Social History   Socioeconomic History  . Marital status: Single    Spouse name: Not on file  . Number of children: Not on file  . Years of education: Not on file  . Highest education level: Not on file  Occupational History  . Occupation: Disabled    Associate Professor: UNEMPLOYED  Social Needs    . Financial resource strain: Not on file  . Food insecurity:    Worry: Not on file    Inability: Not on file  . Transportation needs:    Medical: Not on file    Non-medical: Not on file  Tobacco Use  . Smoking status: Never Smoker  . Smokeless tobacco: Never Used  Substance and Sexual Activity  . Alcohol use: No  . Drug use: No  . Sexual activity: Not on file  Lifestyle  . Physical activity:    Days per week: Not on file    Minutes per session: Not on file  . Stress: Not on file  Relationships  . Social connections:    Talks on phone: Not on file    Gets together: Not on file    Attends religious service: Not on file    Active member of club or organization: Not on file    Attends meetings of clubs or organizations: Not on file    Relationship status: Not on file  . Intimate partner violence:    Fear of current or ex partner: Not on file    Emotionally abused: Not on file    Physically abused: Not on file    Forced sexual activity: Not on file  Other Topics Concern  . Not on file  Social History Narrative   Lives alone   Never married   Physical activity is very good   She has never had a driver's license   Does not have a phone at home     Family History:    Family History  Problem Relation Age of Onset  . Thyroid disease Neg Hx   . Colon cancer Neg Hx   . Colon polyps Neg Hx   . Rectal cancer Neg Hx   . Stomach cancer Neg Hx      ROS:  Please see the history of present illness.   All other ROS reviewed and negative.     Physical Exam/Data:   Vitals:   07/15/18 2321 07/16/18 0321 07/16/18 0815 07/16/18 1133  BP: (!) 162/110 (!) 148/95 (!) 153/77 (!) 183/97  Pulse: 90 83 (!) 116 (!) 107  Resp: 18 17 18 18   Temp: 98.1 F (36.7 C) 98.2 F (36.8 C) 98 F (36.7 C) 98.1 F (36.7 C)  TempSrc: Oral  Oral Oral  SpO2: 96% 99% 99%   Weight:      Height:        Intake/Output Summary (Last 24 hours) at 07/16/2018 1351 Last data filed at 07/16/2018  0320 Gross per 24 hour  Intake 647.59 ml  Output 1075 ml  Net -427.41 ml   Filed Weights   07/14/18 1813  Weight: 57.3 kg   Body mass index is 22.38 kg/m.  General:  Well nourished, well developed, in no acute distress HEENT:  normal Lymph: no adenopathy Neck: 6 cm JVD Endocrine:  No thryomegaly Vascular: No carotid bruits; FA pulses 2+ bilaterally without bruits  Cardiac:  normal S1, S2; RRR; no murmur  Lungs:  clear to auscultation bilaterally, no wheezing, rhonchi or rales  Abd: soft, nontender, no hepatomegaly  Ext: no edema Musculoskeletal:  No deformities, BUE and BLE strength normal and equal Skin: warm and dry  Neuro:  CNs 2-12 intact, no focal abnormalities noted Psych:  Normal affect   EKG:  The EKG was personally reviewed and demonstrates: Normal sinus rhythm with PACs, some of which are nonconducted Telemetry:  Telemetry was personally reviewed and demonstrates: Normal sinus rhythm as well as sinus tachycardia.  Probable AV WB block.  Relevant CV Studies: None  Laboratory Data:  Chemistry Recent Labs  Lab 07/14/18 1842 07/16/18 0341  NA 137 139  K 4.2 4.2  CL 100 105  CO2 30 26  GLUCOSE 102* 122*  BUN 18 15  CREATININE 0.83 0.82  CALCIUM 8.6* 8.4*  GFRNONAA >60 >60  GFRAA >60 >60  ANIONGAP 7 8    Recent Labs  Lab 07/14/18 1842  PROT 6.6  ALBUMIN 3.5  AST 25  ALT 14  ALKPHOS 57  BILITOT 0.6   Hematology Recent Labs  Lab 07/14/18 1842  WBC 7.5  RBC 4.86  HGB 14.4  HCT 45.8  MCV 94.2  MCH 29.6  MCHC 31.4  RDW 12.6  PLT 223   Cardiac EnzymesNo results for input(s): TROPONINI in the last 168 hours. No results for input(s): TROPIPOC in the last 168 hours.  BNPNo results for input(s): BNP, PROBNP in the last 168 hours.  DDimer No results for input(s): DDIMER in the last 168 hours.  Radiology/Studies:  Ct Head Wo Contrast  Result Date: 07/14/2018 CLINICAL DATA:  77 year old female with acute ataxia EXAM: CT HEAD WITHOUT CONTRAST  TECHNIQUE: Contiguous axial images were obtained from the base of the skull through the vertex without intravenous contrast. COMPARISON:  06/14/2017 FINDINGS: Brain: No evidence of acute infarction, hemorrhage, hydrocephalus, extra-axial collection or mass lesion/mass effect. Mild chronic small-vessel white matter ischemic changes again identified. LEFT temporal and basal ganglia encephalomalacia again noted. Vascular: Carotid atherosclerotic calcifications again identified. Skull: No acute abnormality Sinuses/Orbits: No acute abnormality Other: None IMPRESSION: 1. No evidence of acute intracranial abnormality. 2. Mild chronic small-vessel white matter ischemic changes and LEFT temporal/basal ganglia encephalomalacia. Electronically Signed   By: Harmon PierJeffrey  Hu M.D.   On: 07/14/2018 19:23   Mr Brain Wo Contrast  Result Date: 07/15/2018 CLINICAL DATA:  Altered level of consciousness EXAM: MRI HEAD WITHOUT CONTRAST TECHNIQUE: Multiplanar, multiecho pulse sequences of the brain and surrounding structures were obtained without intravenous contrast. COMPARISON:  Head CT from yesterday.  Brain MRI 09/14/2011 FINDINGS: Brain: Cluster of small foci of restricted diffusion in the parasagittal left frontal lobe extending to the corpus callosum. Diffusion alteration at a remote left lateral lenticulostriate infarct is attributed to mineralization. Encephalomalacia in the left temporal lobe where there was parenchymal hemorrhage in 2018. Ventriculomegaly from volume loss, worse on left. No acute hemorrhage. No hydrocephalus or masslike finding. Vascular: Major flow voids are preserved Skull and upper cervical spine: No evidence of marrow lesion Sinuses/Orbits: Status post endoscopic sinus surgery. Bilateral cataract resection. IMPRESSION: 1. 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 2. Remote left temporal lobe hemorrhage and left lateral lenticulostriate infarct. Electronically Signed   By: Marnee SpringJonathon   Watts M.D.   On: 07/15/2018 15:49  Dg Chest Port 1 View  Result Date: 07/14/2018 CLINICAL DATA:  Altered mental status. EXAM: PORTABLE CHEST 1 VIEW COMPARISON:  04/07/2017 FINDINGS: Lungs are adequately inflated without focal consolidation or effusion. Mild stable cardiomegaly. Remainder of the exam is unchanged. IMPRESSION: No acute cardiopulmonary disease. Mild stable cardiomegaly. Electronically Signed   By: Elberta Fortis M.D.   On: 07/14/2018 19:26    Assessment and Plan:   1. Bradycardia due to blocked PACs as well as AV WB block.  The patient's telemetry findings are fairly unimpressive.  She is not have any clear-cut symptoms although even with her interpreter, her symptoms are hard to sort out.  It appears that she may have rated cardia as a consequence of her stroke.  Her repeat ECG demonstrates a mildly prolonged QRS duration and no R waves inferiorly, anteriorly, or laterally.  Despite this, she has normal left ventricular systolic function.  I would recommend avoiding AV nodal or sinus nodal blocking drugs, keeping electrolytes replete, and we will attempt to have her wear a 2-week monitor as an outpatient.  There is currently no indication for pacemaker insertion.  [facility] will sign off.   Medication Recommendations:  See above Other recommendations (labs, testing, etc):  Outpatient 2 week monitor Follow up as an outpatient:  With Dr. Ladona Ridgel in 6 weeks, 2 weeks after she finishes wearing her outpatient monitor. For questions or updates, please contact [facility] Please consult [email or web address] for contact info under Cardiology/STEMI.   Signed, Lewayne Bunting, MD  07/16/2018 1:51 PM

## 2018-07-16 NOTE — Progress Notes (Signed)
EEG complete - results pending 

## 2018-07-16 NOTE — Progress Notes (Signed)
PROGRESS NOTE  Laura [name] ZOX:[id number] DOB: 1940-12-03 DOA: 07/14/2018 PCP: Romero BellingEllison, Sean, MD  Brief history:  H/o autism and  Deafness, from From independent living with weakness and encephalopathyx2weeks Found to have Bradycardia and acute CVA   HPI/Recap of past 24 hours:    Family  at bedside   She reports now she is feeling fine, she denies pain, no acute complaints   She waves at me with her right hand when I entered the room  Assessment/Plan: Principal Problem:   Acute encephalopathy Active Problems:   Hypothyroidism   Essential hypertension   Abnormal involuntary movement   CVA (cerebral vascular accident) (HCC)   Bradycardia   Autism spectrum disorder   Deaf   Encephalopathy  -presenting symptom of dizziness, fatigue, gait instability and speech (sign language) patten changes -No source of infection  -eeg negative.  -uds negative -MRI " 1. 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 2. Remote left temporal lobe hemorrhage and left lateral lenticulostriate infarct." -TTE echocardiogram no report of embolic source -carotid us ordered,  -ldl 168 -a1c 6.7 -start asa/statin for now -neurology consulted  Bradycardia/arrythmia: -presenting symptom of dizziness, fatigue, increased gait instability, not sure if related to cardiac or neuro -no reported syncope initially, on further question, family reports patient is found down in the elevator prior to coming to the hospital, they are not sure what cause the fall, they did notice patient has increased gait instability recently -patient  has h/o falls with subdural hematoma in the past -bp stable -Keep on tele -Echo lvef wnl -case discussed with cardiology Dr Ladona Ridgelaylor   HTN: Will allow permissive hypertension until mri rule out actue cva  tsh elevated Patient reports she has not been taking synthroid , restart home does synthroid  H/o CVA? Per chart and subdural hematoma, was  started on keppra for seizure prevention in 03/2017  Autism/deaf -from independent living Use sign language   Code Status: full  Family Communication: patient and family at bedside  Disposition Plan: pending   Consultants:  Neurology  cardiology  Procedures:  none  Antibiotics:  none   Objective: BP (!) 148/95 (BP Location: Right Arm)   Pulse 83   Temp 98.2 F (36.8 C)   Resp 17   Ht 5\' 3"  (1.6 m)   Wt 57.3 kg   SpO2 99%   BMI 22.38 kg/m   Intake/Output Summary (Last 24 hours) at 07/16/2018 0743 Last data filed at 07/16/2018 0320 Gross per 24 hour  Intake 1481.23 ml  Output 2675 ml  Net -1193.77 ml   Filed Weights   07/14/18 1813  Weight: 57.3 kg    Exam: Patient is examined daily including today on 07/16/2018, exams remain the same as of yesterday except that has changed    General:  NAD, able to communicate through sign language  Cardiovascular: RRR  Respiratory: CTABL  Abdomen: Soft/ND/NT, positive BS  Musculoskeletal: No Edema  Neuro: alert, oriented   Data Reviewed: Basic Metabolic Panel: Recent Labs  Lab 07/14/18 1842 07/14/18 2350 07/16/18 0341  NA 137  --  139  K 4.2  --  4.2  CL 100  --  105  CO2 30  --  26  GLUCOSE 102*  --  122*  BUN 18  --  15  CREATININE 0.83  --  0.82  CALCIUM 8.6*  --  8.4*  MG  --  1.9  --    Liver Function Tests: Recent Labs  Lab 07/14/18 1842  AST 25  ALT 14  ALKPHOS 57  BILITOT 0.6  PROT 6.6  ALBUMIN 3.5   No results for input(s): LIPASE, AMYLASE in the last 168 hours. Recent Labs  Lab 07/14/18 2350  AMMONIA 24   CBC: Recent Labs  Lab 07/14/18 1842  WBC 7.5  NEUTROABS 4.7  HGB 14.4  HCT 45.8  MCV 94.2  PLT 223   Cardiac Enzymes:   No results for input(s): CKTOTAL, CKMB, CKMBINDEX, TROPONINI in the last 168 hours. BNP (last 3 results) No results for input(s): BNP in the last 8760 hours.  ProBNP (last 3 results) No results for input(s): PROBNP in the last 8760  hours.  CBG: Recent Labs  Lab 07/14/18 1932  GLUCAP 96    No results found for this or any previous visit (from the past 240 hour(s)).   Studies: Mr Brain Wo Contrast  Result Date: 07/15/2018 CLINICAL DATA:  Altered level of consciousness EXAM: MRI HEAD WITHOUT CONTRAST TECHNIQUE: Multiplanar, multiecho pulse sequences of the brain and surrounding structures were obtained without intravenous contrast. COMPARISON:  Head CT from yesterday.  Brain MRI 09/14/2011 FINDINGS: Brain: Cluster of small foci of restricted diffusion in the parasagittal left frontal lobe extending to the corpus callosum. Diffusion alteration at a remote left lateral lenticulostriate infarct is attributed to mineralization. Encephalomalacia in the left temporal lobe where there was parenchymal hemorrhage in 2018. Ventriculomegaly from volume loss, worse on left. No acute hemorrhage. No hydrocephalus or masslike finding. Vascular: Major flow voids are preserved Skull and upper cervical spine: No evidence of marrow lesion Sinuses/Orbits: Status post endoscopic sinus surgery. Bilateral cataract resection. IMPRESSION: 1. 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 2. Remote left temporal lobe hemorrhage and left lateral lenticulostriate infarct. Electronically Signed   By: Marnee Spring M.D.   On: 07/15/2018 15:49    Scheduled Meds: . aspirin  81 mg Oral Daily  . atorvastatin  40 mg Oral q1800  . hydrALAZINE  25 mg Oral Q8H  . levETIRAcetam  500 mg Oral BID  . levothyroxine  50 mcg Oral QAC breakfast    Continuous Infusions: . sodium chloride 1,000 mL (07/16/18 0210)     Time spent: >25mins, case discussed with neurology and cardiology I have personally reviewed and interpreted on  07/16/2018 daily labs, tele strips, imagings as discussed above under date review session and assessment and plans.  I reviewed all nursing notes, pharmacy notes, consultant notes,  vitals, pertinent old records  I  have discussed plan of care as described above with RN , patient and family on 07/16/2018   Albertine Grates MD, PhD  [facility] Pager [phone]. If 7PM-7AM, please contact night-coverage at [email or web address], password [facility] 07/16/2018, 7:43 AM  LOS: 2 days

## 2018-07-17 ENCOUNTER — Other Ambulatory Visit: Payer: Self-pay | Admitting: Physician Assistant

## 2018-07-17 ENCOUNTER — Inpatient Hospital Stay (HOSPITAL_COMMUNITY): Payer: Medicare Other

## 2018-07-17 ENCOUNTER — Encounter (HOSPITAL_COMMUNITY): Payer: Self-pay

## 2018-07-17 DIAGNOSIS — I63422 Cerebral infarction due to embolism of left anterior cerebral artery: Secondary | ICD-10-CM

## 2018-07-17 DIAGNOSIS — Z8673 Personal history of transient ischemic attack (TIA), and cerebral infarction without residual deficits: Secondary | ICD-10-CM

## 2018-07-17 DIAGNOSIS — R000 Tachycardia, unspecified: Secondary | ICD-10-CM

## 2018-07-17 DIAGNOSIS — R001 Bradycardia, unspecified: Secondary | ICD-10-CM

## 2018-07-17 DIAGNOSIS — E119 Type 2 diabetes mellitus without complications: Secondary | ICD-10-CM

## 2018-07-17 DIAGNOSIS — I639 Cerebral infarction, unspecified: Secondary | ICD-10-CM

## 2018-07-17 LAB — GLUCOSE, CAPILLARY
GLUCOSE-CAPILLARY: 192 mg/dL — AB (ref 70–99)
GLUCOSE-CAPILLARY: 171 mg/dL — AB (ref 70–99)
Glucose-Capillary: 132 mg/dL — ABNORMAL HIGH (ref 70–99)

## 2018-07-17 MED ORDER — ASPIRIN EC 325 MG PO TBEC
325.0000 mg | DELAYED_RELEASE_TABLET | Freq: Every day | ORAL | Status: DC
  Administered 2018-07-17 – 2018-07-19 (×3): 325 mg via ORAL
  Filled 2018-07-17 (×3): qty 1

## 2018-07-17 MED ORDER — CLOPIDOGREL BISULFATE 75 MG PO TABS
75.0000 mg | ORAL_TABLET | Freq: Every day | ORAL | Status: DC
  Administered 2018-07-17 – 2018-07-19 (×3): 75 mg via ORAL
  Filled 2018-07-17 (×3): qty 1

## 2018-07-17 NOTE — Evaluation (Addendum)
Occupational Therapy Evaluation Patient Details Name: Laura [name] MRN: [id number] DOB: 1941/06/27 Today's Date: 07/17/2018    History of Present Illness 77 y.o. female admitted with 2 week history of encephalopathy found to have acute CVA and bradycardia. PMH includes but not limited to: Autism, Deafness, prior CVA, tremor, hypothyroidism, HTN, HLD.    Clinical Impression   PTA, pt was living in independent living at [facility]. She has a history of multiple falls. Family comes in to check on pt at times. Pt currently requires overall min assist for LB ADL and mod assist for transfer from commode during toileting tasks. She demonstrates decreased problem solving and attention this session and perseverated on signs throughout session ([employer] interpreter utilized). She additionally signed information unrelated to topic of conversation. Pt presenting with R sided weakness and decreased coordination as well as difficulty navigating around obstacles on her R potentially indicating R sided visual deficits. She would benefit from continued OT services while admitted to improve independence and safety with ADL and functional mobility. Recommend CIR level therapies post-acute D/C to maximize return to PLOF.     Follow Up Recommendations  CIR;Supervision/Assistance - 24 hour    Equipment Recommendations  Other (comment)(defer to next venue of care)    Recommendations for Other Services       Precautions / Restrictions Precautions Precautions: Fall Restrictions Weight Bearing Restrictions: No      Mobility Bed Mobility Overal bed mobility: Needs Assistance Bed Mobility: Supine to Sit;Sit to Supine     Supine to sit: Min assist Sit to supine: Min assist   General bed mobility comments: Assist to manage RLE into and out of bed.   Transfers Overall transfer level: Needs assistance Equipment used: Rolling walker (2 wheeled) Transfers: Sit to/from Stand Sit to Stand: Min assist;Mod  assist         General transfer comment: Min assist to power up from bed and mod assist to power up from low commode.     Balance Overall balance assessment: Needs assistance Sitting-balance support: No upper extremity supported;Feet supported Sitting balance-Leahy Scale: Fair     Standing balance support: Bilateral upper extremity supported;No upper extremity supported;During functional activity Standing balance-Leahy Scale: Poor Standing balance comment: Requires UE support.                            ADL either performed or assessed with clinical judgement   ADL Overall ADL's : Needs assistance/impaired Eating/Feeding: Set up;Sitting   Grooming: Minimal assistance;Sitting   Upper Body Bathing: Supervision/ safety;Sitting   Lower Body Bathing: Minimal assistance;Sit to/from stand   Upper Body Dressing : Supervision/safety;Sitting   Lower Body Dressing: Minimal assistance;Sit to/from stand   Toilet Transfer: Minimal assistance;Ambulation;RW;Moderate assistance Toilet Transfer Details (indicate cue type and reason): Min assist from bed and mod assist from toilet.  Toileting- Clothing Manipulation and Hygiene: Supervision/safety;Sitting/lateral lean       Functional mobility during ADLs: Minimal assistance;Moderate assistance;Rolling walker General ADL Comments: Pt able to complete automatic toileting tasks. Required cues to wash hands. Self-directed tasks throughout.      Vision Baseline Vision/History: Wears glasses Wears Glasses: At all times Patient Visual Report: Other (comment)(caregiver reports visual deficits at baseline) Vision Assessment?: Vision impaired- to be further tested in functional context Additional Comments: Noted pt with difficulty navigating obstacles on the R. Suspect potential inattention to R visual field but need to further assess.      Perception  Praxis      Pertinent Vitals/Pain Pain Assessment: No/denies pain      Hand Dominance     Extremity/Trunk Assessment Upper Extremity Assessment Upper Extremity Assessment: RUE deficits/detail RUE Deficits / Details: Appears weak compared with L. Signs with L hand today. Slow movement noted.    Lower Extremity Assessment Lower Extremity Assessment: RLE deficits/detail RLE Deficits / Details: Difficulty raising RLE into bed.  RLE Coordination: decreased fine motor;decreased gross motor       Communication Communication Communication: Deaf;Expressive difficulties;Receptive difficulties;Interpreter utilized(perseverating; contracted interpreter present)   Cognition Arousal/Alertness: Awake/alert Behavior During Therapy: Impulsive Overall Cognitive Status: Difficult to assess Area of Impairment: Attention;Problem solving;Following commands                   Current Attention Level: Sustained   Following Commands: Follows one step commands inconsistently     Problem Solving: Decreased initiation;Slow processing;Difficulty sequencing General Comments: Pt has autism at baseline but has demonstrated cognitive decline over the past week. Caregiver reports that she has been attempting to drink from water bottle with lid on. Pt impulsive and perseverative. Decreased problem solving noted during toileting tasks.    General Comments  Hospital contracted interpreter, Darl PikesSusan, also reports that she is HCPOA. Offered to call different interpreter [name]([name] currently assigned to interpret for pt via contract company) although she reports that she will continue interpreting as no medical decisions being made. Pt is in agreement.    Exercises     Shoulder Instructions      Home Living Family/patient expects to be discharged to:: Inpatient rehab                                 Additional Comments: [facility].      Prior Functioning/Environment Level of Independence: Independent with  assistive device(s)        Comments: Pt living in independent living facility. Over the past few weeks she has demonstrated cognitive decline per POA. She has a history of falling and staff will find her down in room at times when they notice her absent at breakfast.         OT Problem List: Decreased strength;Decreased range of motion;Decreased activity tolerance;Impaired balance (sitting and/or standing);Decreased safety awareness;Decreased knowledge of use of DME or AE;Decreased knowledge of precautions;Decreased cognition;Decreased coordination;Impaired vision/perception;Pain;Impaired UE functional use      OT Treatment/Interventions: Self-care/ADL training;Therapeutic exercise;Energy conservation;DME and/or AE instruction;Therapeutic activities;Patient/family education;Balance training;Cognitive remediation/compensation;Visual/perceptual remediation/compensation    OT Goals(Current goals can be found in the care plan section) Acute Rehab OT Goals Patient Stated Goal: to lay back down OT Goal Formulation: With patient Time For Goal Achievement: 07/31/18 Potential to Achieve Goals: Good ADL Goals Pt Will Perform Grooming: with modified independence;standing Pt Will Perform Upper Body Dressing: with modified independence;sitting Pt Will Perform Lower Body Dressing: with modified independence;sit to/from stand Pt Will Transfer to Toilet: with modified independence;ambulating;regular height toilet Pt Will Perform Toileting - Clothing Manipulation and hygiene: with modified independence;sit to/from stand Pt Will Perform Tub/Shower Transfer: with modified independence;Shower transfer;ambulating;rolling walker  OT Frequency: Min 3X/week   Barriers to D/C:            Co-evaluation              AM-PAC PT "6 Clicks" Daily Activity     Outcome Measure Help from another person eating meals?: None Help from another person taking care  of personal grooming?: A Little Help from  another person toileting, which includes using toliet, bedpan, or urinal?: A Lot Help from another person bathing (including washing, rinsing, drying)?: A Little Help from another person to put on and taking off regular upper body clothing?: A Little Help from another person to put on and taking off regular lower body clothing?: A Little 6 Click Score: 18   End of Session Equipment Utilized During Treatment: Gait belt;Rolling walker Nurse Communication: Mobility status  Activity Tolerance: Patient tolerated treatment well Patient left: with call bell/phone within reach  OT Visit Diagnosis: Other abnormalities of gait and mobility (R26.89);Hemiplegia and 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

## 2018-07-17 NOTE — Consult Note (Signed)
Physical Medicine and Rehabilitation Consult Reason for Consult: left ACA infarcts Referring Physician: Albertine Grates, MD   HPI: Laura [name] is a 77 y.o. female with past medical history of tremors, autism spectrum disorder, mild mental retardation, hypothyroidism, hypertension, deafness, ICH in 03/2017, arthritis presented on 07/14/18 with left ACA stroke. History taken from chart review. Patient lives in an independent living facility, where she had increased difficulty with gait, confusion, particularly over the last 3 days. She was brought to the ED. She was noted to be bradycardic. MRI an EEG ordered. Neurology consulted. There was some concern for seizures. CT head ordered, reviewed, unremarkable for acute intracranial process. 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 Cardiology was consult and due to bradycardia recommended outpatient monitoring   Review of Systems  Unable to perform ROS: Patient unresponsive   Past Medical History:  Diagnosis Date  . Anxiety state, unspecified 10/10/2007  . Arthritis   . ASYMPTOMATIC POSTMENOPAUSAL STATUS 04/15/2009  . Cataract    removed both eyes   . Deaf   . Dermatitis   . Headache(784.0) 08/27/2008  . HYPERLIPIDEMIA 06/22/2007   rx refused  . HYPERTENSION 05/07/2008  . HYPOTHYROIDISM 04/15/2009  . Mild mental retardation   . 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 RESTLESS LEG SYNDROME 10/01/2008  . TACHYCARDIA 08/27/2008  . TREMOR 11/21/2007  . TRIGGER FINGER 10/30/2009   Past Surgical History:  Procedure Laterality Date  . CATARACT EXTRACTION, BILATERAL    . COLONOSCOPY     Family History  Problem Relation Age of Onset  . Thyroid disease Neg Hx   . Colon cancer Neg Hx   . Colon polyps Neg Hx   . Rectal cancer Neg Hx   . Stomach cancer Neg Hx    Social History:  reports that she has never smoked. She has never used smokeless tobacco. She  reports that she does not drink alcohol or use drugs. Allergies: No Known Allergies Medications Prior to Admission  Medication Sig Dispense Refill  . alendronate (FOSAMAX) 70 MG tablet TAKE 1 TABLET BY MOUTH ONCE A WEEK .  TAKE  WITH  A  FULL  GLASS  OF  WATER  ON  AN  EMPTY  STOMACH (Patient taking differently: Take 70 mg by mouth once a week. On Saturday) 4 tablet 11  . amLODipine (NORVASC) 10 MG tablet Take 1 tablet (10 mg total) by mouth daily.    . cholecalciferol (VITAMIN D) 1000 UNITS tablet Take 1,000 Units by mouth daily.    . clonazePAM (KLONOPIN) 1 MG tablet Take 1 tablet (1 mg total) by mouth 3 (three) times daily as needed (tremor). 90 tablet 2  . levETIRAcetam (KEPPRA) 500 MG tablet Take 1 tablet (500 mg total) by mouth 2 (two) times daily.    Marland Kitchen levothyroxine (SYNTHROID, LEVOTHROID) 50 MCG tablet Take 1 tablet (50 mcg total) by mouth daily. 90 tablet 1  . Multiple Vitamin (MULTIVITAMIN) tablet Take 1 tablet by mouth daily.      Marland Kitchen triamcinolone cream (KENALOG) 0.1 % APPLY  CREAM EXTERNALLY THREE TIMES DAILY AS NEEDED FOR ITCHING (Patient not taking: Reported on 07/14/2018) 80 g 2    Home: Home Living Family/patient expects to be discharged to:: Inpatient rehab Additional Comments: Previously living in infependent living at [facility].  Functional History: Prior Function Level of Independence: Independent with assistive device(s) Comments: Pt living in independent living facility. Over the  past few weeks she has demonstrated cognitive decline per POA. She has a history of falling and staff will find her down in room at times when they notice her absent at breakfast.  Functional Status:  Mobility: Bed Mobility Overal bed mobility: Needs Assistance Bed Mobility: Supine to Sit, Sit to Supine Supine to sit: Min assist Sit to supine: Min assist General bed mobility comments: Assist to manage RLE into and out of bed.  Transfers Overall transfer level: Needs  assistance Equipment used: Rolling walker (2 wheeled) Transfers: Sit to/from Stand Sit to Stand: Min assist, Mod assist General transfer comment: Min assist to power up from bed and mod assist to power up from low commode.  Ambulation/Gait Ambulation/Gait assistance: Min guard Gait Distance (Feet): 20 Feet Assistive device: Rolling walker (2 wheeled) Gait Pattern/deviations: Step-to pattern General Gait Details: pt ambulating with poor awarness and obstacale avoidance with RW. min guard to min A for stability at times but able to walk to bathroom this visit and back to bed.  Gait velocity: decreased    ADL: ADL Overall ADL's : Needs assistance/impaired Eating/Feeding: Set up, Sitting Grooming: Minimal assistance, Sitting Upper Body Bathing: Supervision/ safety, Sitting Lower Body Bathing: Minimal assistance, Sit to/from stand Upper Body Dressing : Supervision/safety, Sitting Lower Body Dressing: Minimal assistance, Sit to/from stand Toilet Transfer: Minimal assistance, Ambulation, RW, Moderate assistance Toilet Transfer Details (indicate cue type and reason): Min assist from bed and mod assist from toilet.  Toileting- Clothing Manipulation and Hygiene: Supervision/safety, Sitting/lateral lean Functional mobility during ADLs: Minimal assistance, Moderate assistance, Rolling walker General ADL Comments: Pt able to complete automatic toileting tasks. Required cues to wash hands. Self-directed tasks throughout.   Cognition: Cognition Overall Cognitive Status: Difficult to assess Orientation Level: Appropriate for developmental age, Oriented X4(use of interpreter for assessment ) Cognition Arousal/Alertness: Awake/alert Behavior During Therapy: Impulsive Overall Cognitive Status: Difficult to assess Area of Impairment: Attention, Problem solving, Following commands Current Attention Level: Sustained Following Commands: Follows one step commands inconsistently Problem Solving:  Decreased initiation, Slow processing, Difficulty sequencing General Comments: Pt has autism at baseline but has demonstrated cognitive decline over the past week. Caregiver reports that she has been attempting to drink from water bottle with lid on. Pt impulsive and perseverative. Decreased problem solving noted during toileting tasks.  Difficult to assess due to: Hard of hearing/deaf  Blood pressure (!) 148/90, pulse (!) 102, temperature 98.2 F (36.8 C), temperature source Oral, resp. rate 18, height 5\' 3"  (1.6 m), weight 57.3 kg, SpO2 94 %. Physical Exam  Constitutional: She appears well-developed and well-nourished.  HENT:  Head: Normocephalic and atraumatic.  Eyes: Right eye exhibits no discharge. Left eye exhibits no discharge. No scleral icterus.  Neck: Normal range of motion. Neck supple.  Cardiovascular: Normal rate and regular rhythm.  Respiratory: Effort normal and breath sounds normal.  GI: Soft. Bowel sounds are normal.  Musculoskeletal:  No edema or tenderness in extremities  Neurological: She is alert.  Motor: Limited due to cognition and ability to understand commands, but appears to be greater than or equal to 3/5 throughout  Skin: Skin is warm and dry.  Psychiatric: Her affect is blunt. She is slowed. She is noncommunicative.    Results for orders placed or performed during the hospital encounter of 07/14/18 (from the past 24 hour(s))  Glucose, capillary     Status: Abnormal   Collection Time: 07/16/18  9:20 PM  Result Value Ref Range   Glucose-Capillary 186 (H) 70 - 99 mg/dL  Glucose, capillary     Status: Abnormal   Collection Time: 07/17/18 12:17 PM  Result Value Ref Range   Glucose-Capillary 132 (H) 70 - 99 mg/dL  Glucose, capillary     Status: Abnormal   Collection Time: 07/17/18  4:34 PM  Result Value Ref Range   Glucose-Capillary 192 (H) 70 - 99 mg/dL   Ct Angio Head W Or Wo Contrast  Result Date: 07/16/2018 CLINICAL DATA:  Acute infarctions in the  left anterior cerebral artery territory demonstrated by MRI yesterday. EXAM: CT ANGIOGRAPHY HEAD AND NECK TECHNIQUE: Multidetector CT imaging of the head and neck was performed using the standard protocol during bolus administration of intravenous contrast. Multiplanar CT image reconstructions and MIPs were obtained to evaluate the vascular anatomy. Carotid stenosis measurements (when applicable) are obtained utilizing NASCET criteria, using the distal internal carotid diameter as the denominator. CONTRAST:  50mL ISOVUE-370 IOPAMIDOL (ISOVUE-370) INJECTION 76% COMPARISON:  MR and CT examinations done 07/15/2018 and 07/14/2018. FINDINGS: CTA NECK FINDINGS Aortic arch: Ectasia of the aortic after check without dissection. Right carotid system: Common carotid artery is patent to the bifurcation region which is low within the neck. There is atherosclerotic plaque at the carotid bifurcation but there is no stenosis or irregularity. Cervical ICA is widely patent. Left carotid system: Common carotid artery is widely patent to the bifurcation region. There is atherosclerotic plaque at the carotid bifurcation but no stenosis or irregularity. Cervical ICA is widely patent. Vertebral arteries: There is some atherosclerotic plaque of both proximal subclavian arteries but no flow limiting stenosis. Right vertebral artery is widely patent. Left vertebral artery shows a small calcified plaque with 30% stenosis at the origin. Beyond that, both vertebral arteries are widely patent through the cervical region to the foramen magnum. 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 Upper chest: Normal Review of the MIP images confirms the above findings CTA HEAD FINDINGS Anterior circulation: Both internal carotid arteries are patent through the skull base and siphon regions without stenosis. The anterior and middle cerebral vessels  are patent without proximal stenosis, aneurysm or vascular malformation. The left anterior cerebral artery is occluded in the A2 segment. There is thready reconstitution beyond that initial occlusion. Posterior circulation: Both vertebral arteries are patent to the basilar. No basilar stenosis. Posterior circulation branch vessels are normal. 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 Venous sinuses: Patent and normal. Anatomic variants: None other significant. 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 Delayed phase: No abnormal enhancement. 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 Minimal atherosclerotic disease at both carotid bifurcations but without stenosis or significant irregularity. Occlusion of the left anterior cerebral artery in the A2 segment with thready reconstitution beyond that. Atherosclerotic disease of both subclavian arteries proximal to the vertebral artery origins, but without flow limiting stenosis. 30% stenosis of the left vertebral artery origin. 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 Electronically Signed   By: Paulina FusiMark  Shogry M.D.   On: 07/16/2018 16:16   Ct Angio Neck W Or Wo Contrast  Result Date: 07/16/2018 CLINICAL DATA:  Acute infarctions in the left anterior cerebral artery territory demonstrated by MRI yesterday. EXAM: CT ANGIOGRAPHY HEAD AND NECK TECHNIQUE: Multidetector CT imaging of the head and neck was performed using  the standard protocol during bolus administration of intravenous contrast. Multiplanar CT image reconstructions and MIPs were obtained to evaluate the vascular anatomy. Carotid stenosis measurements (when applicable) are obtained utilizing NASCET criteria, using the distal internal carotid diameter as the denominator. CONTRAST:  50mL  ISOVUE-370 IOPAMIDOL (ISOVUE-370) INJECTION 76% COMPARISON:  MR and CT examinations done 07/15/2018 and 07/14/2018. FINDINGS: CTA NECK FINDINGS Aortic arch: Ectasia of the aortic after check without dissection. Right carotid system: Common carotid artery is patent to the bifurcation region which is low within the neck. There is atherosclerotic plaque at the carotid bifurcation but there is no stenosis or irregularity. Cervical ICA is widely patent. Left carotid system: Common carotid artery is widely patent to the bifurcation region. There is atherosclerotic plaque at the carotid bifurcation but no stenosis or irregularity. Cervical ICA is widely patent. Vertebral arteries: There is some atherosclerotic plaque of both proximal subclavian arteries but no flow limiting stenosis. Right vertebral artery is widely patent. Left vertebral artery shows a small calcified plaque with 30% stenosis at the origin. Beyond that, both vertebral arteries are widely patent through the cervical region to the foramen magnum. 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 Upper chest: Normal Review of the MIP images confirms the above findings CTA HEAD FINDINGS Anterior circulation: Both internal carotid arteries are patent through the skull base and siphon regions without stenosis. The anterior and middle cerebral vessels are patent without proximal stenosis, aneurysm or vascular malformation. The left anterior cerebral artery is occluded in the A2 segment. There is thready reconstitution beyond that initial occlusion. Posterior circulation: Both vertebral arteries are patent to the basilar. No basilar stenosis. Posterior circulation branch vessels are normal. 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 Venous sinuses: Patent and normal. Anatomic variants: None other significant.  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 Delayed phase: No abnormal enhancement. 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 Minimal atherosclerotic disease at both carotid bifurcations but without stenosis or significant irregularity. Occlusion of the left anterior cerebral artery in the A2 segment with thready reconstitution beyond that. Atherosclerotic disease of both subclavian arteries proximal to the vertebral artery origins, but without flow limiting stenosis. 30% stenosis of the left vertebral artery origin. 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 Electronically Signed   By: Paulina Fusi M.D.   On: 07/16/2018 16:16    Assessment/Plan: Diagnosis: left ACA infarcts Labs and images independently reviewed.  Records reviewed and summated above. Stroke: Continue secondary stroke prophylaxis and Risk Factor Modification listed below:   Antiplatelet therapy:   Blood Pressure Management:  Continue current medication with prn's with permisive HTN per primary team Statin Agent:   Diabetes management:   ?hemiparesis:  Motor recovery: Fluoxetine  1. Does the need for close, 24 hr/day medical supervision in concert with the patient's rehab needs make it unreasonable for this patient to be served in a less intensive setting? Potentially 2. Co-Morbidities requiring supervision/potential complications: tremors, autism spectrum disorder, mild mental retardation, hypothyroidism (TSH elevated,cont meds, ensure appropriate mood and energy level for therapies), HTN (monitor and provide prns in accordance with increased physical exertion and pain), deafness, ICH in 03/2017, arthritis, DM (Monitor in accordance with exercise and adjust meds as necessary), Tachycardia (monitor in accordance  with pain and increasing activity) 3. Due to safety,  disease management, medication administration and patient education, does the patient require 24 hr/day rehab nursing? Yes 4. Does the patient require coordinated care of a physician, rehab nurse, PT (1-2 hrs/day, 5 days/week), OT (1-2 hrs/day, 5 days/week) and SLP (1-2 hrs/day, 5 days/week) to address physical and functional deficits in the context of the above medical diagnosis(es)? Yes Addressing deficits in the following areas: balance, endurance, locomotion, strength, transferring, bathing, dressing, toileting, cognition, language and psychosocial support 5. Can the patient actively participate in an intensive therapy program of at least 3 hrs of therapy per day at least 5 days per week? Yes 6. The potential for patient to make measurable gains while on inpatient rehab is good 7. Anticipated functional outcomes upon discharge from inpatient rehab are supervision  with PT, supervision with OT, supervision with SLP. 8. Estimated rehab length of stay to reach the above functional goals is: 5-7 days. 9. Anticipated D/C setting: Home 10. Anticipated post D/C treatments: HH therapy and Home excercise program 11. Overall Rehab/Functional Prognosis: good  RECOMMENDATIONS: This patient's condition is appropriate for continued rehabilitative care in the following setting: Unclear baseline level of functioning or caregiver support at discharge. However, patient appears to be doing relatively well on day of evaluation. Will need to investigate further. We'll consider CIR if patient not at or near baseline level of function.. Patient has agreed to participate in recommended program. Potentially Note that insurance prior authorization may be required for reimbursement for recommended care.  Comment: Rehab Admissions Coordinator to follow up.  Maryla Morrow, MD, Evert Kohl 07/17/2018

## 2018-07-17 NOTE — Progress Notes (Addendum)
STROKE TEAM PROGRESS NOTE   SUBJECTIVE (INTERVAL HISTORY) Her friends and sign language interpretor are at the bedside.  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 Awake alert, still has right LE weakness. MRI showed largely right ACA infarct. Tele showed sinus tachy with sinus brady, cardiology EP consulted and requested cardiac event monitoring.     OBJECTIVE Vitals:   07/16/18 2011 07/16/18 2319 07/17/18 0410 07/17/18 0830  BP: (!) 176/95 (!) 162/96 131/60 (!) 149/91  Pulse: (!) 101 84 98 (!) 115  Resp: 18 16 20 18   Temp: 98.4 F (36.9 C)  98.8 F (37.1 C) 98 F (36.7 C)  TempSrc: Oral  Oral Oral  SpO2: 99% 100% 100% 100%  Weight:      Height:        CBC:  Recent Labs  Lab 07/14/18 1842  WBC 7.5  NEUTROABS 4.7  HGB 14.4  HCT 45.8  MCV 94.2  PLT 223    Basic Metabolic Panel:  Recent Labs  Lab 07/14/18 1842 07/14/18 2350 07/16/18 0341  NA 137  --  139  K 4.2  --  4.2  CL 100  --  105  CO2 30  --  26  GLUCOSE 102*  --  122*  BUN 18  --  15  CREATININE 0.83  --  0.82  CALCIUM 8.6*  --  8.4*  MG  --  1.9  --     Lipid Panel:     Component Value Date/Time   CHOL 268 (H) 07/16/2018 0341   TRIG 291 (H) 07/16/2018 0341   HDL 42 07/16/2018 0341   CHOLHDL 6.4 07/16/2018 0341   VLDL 58 (H) 07/16/2018 0341   LDLCALC 168 (H) 07/16/2018 0341   HgbA1c:  Lab Results  Component Value Date   HGBA1C 6.7 (H) 07/16/2018   Urine Drug Screen:     Component Value Date/Time   LABOPIA NONE DETECTED 07/15/2018 0755   COCAINSCRNUR NONE DETECTED 07/15/2018 0755   LABBENZ NONE DETECTED 07/15/2018 0755   AMPHETMU NONE DETECTED 07/15/2018 0755   THCU NONE DETECTED 07/15/2018 0755   LABBARB NONE DETECTED 07/15/2018 0755    Alcohol Level No results found for: ETH  IMAGING  Ct Angio Head W Or Wo Contrast Ct Angio Neck W Or Wo Contrast 07/16/2018 IMPRESSION:  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  Minimal atherosclerotic disease at both carotid bifurcations but without stenosis or significant irregularity.  Occlusion of the left anterior cerebral artery in the A2 segment with thready reconstitution beyond that.  Atherosclerotic disease of both subclavian arteries proximal to the vertebral artery origins, but without flow limiting stenosis.  30% stenosis of the left vertebral artery origin.  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   Mr Brain Wo Contrast 07/15/2018 IMPRESSION:  1. 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  2. Remote left temporal lobe hemorrhage and left lateral lenticulostriate infarct.   Transthoracic Echocardiogram  07/15/2018 Study Conclusions - Left ventricle: The cavity size was normal. Systolic function was   normal. The estimated ejection fraction was in the range of 55%   to 60%. Wall motion was normal; there were no regional wall   motion abnormalities. Doppler parameters are consistent with   abnormal left ventricular relaxation (grade 1 diastolic   dysfunction). Doppler parameters are consistent with elevated   mean left atrial filling pressure. -  Left atrium: The atrium was mildly dilated. Impressions: - Incessant PAC and blocked PACs are seen throughout the study.   Bilateral LE Venous Dopplers - There is no DVT or SVT noted in the bilateral lower extremities.     PHYSICAL EXAM Blood pressure (!) 149/91, pulse (!) 115, temperature 98 F (36.7 C), temperature source Oral, resp. rate 18, height 5\' 3"  (1.6 m), weight 57.3 kg, SpO2 100 %.   Temp:  [98 F (36.7 C)-98.8 F (37.1 C)] 98.2 F (36.8 C) (08/25 1540) Pulse Rate:  [84-115] 102 (08/25 1540) Resp:  [16-20] 18 (08/25 1540) BP: (131-176)/(60-106) 156/106 (08/25 1540) SpO2:  [94 %-100 %] 94 % (08/25 1540)  General - Well nourished, well developed, in no apparent distress, facial features  of mental retardation.  Ophthalmologic - fundi not visualized due to noncooperation.  Cardiovascular - irregular rhythm with frequent premature beats.  Neuro - awake, alert, nonverbal, deafness, heads sign language interpreter at bedside.  She is orientated to place, people, but not orientated to time her age.  Visual field intact on confrontation, PERRL, EOMI, tracking objects.  Facial symmetric, tongue midline.  Bilateral upper extremity 4+/5, left lower extremity 4/5 proximal and distal, right lower extremity 3/5 proximal and 3- +5 distal.  Bilateral upper extremity coordination intact.  Sensation and gait not able to test.   ASSESSMENT/PLAN Laura [name] is a 77 y.o. female with history of autism spectrum disorder, deafness, HTN,HLD,ICH/SDHin May 2018, hypothyroidism, chronic tremor  presenting with AMS. She did not receive IV t-PA due to late presentation and no focal deficits.  Stroke: Left ACA scattered infarcts, embolic pattern -  source unknown but concerning for cardiac arrhythmia  CT head - No evidence of acute intracranial abnormality.  MRI head - Cluster of small acute infarcts in the distal left ACA territory.   CTA H&N - Occlusion of the left ACA A2 segment   LE venous Doppler no DVT  Recommend 30-day cardio event monitor as outpatient to rule out A. fib  2D Echo  - EF 55 - 60%. No cardiac source of emboli identified.   LDL - 168   HgbA1c - 6.7  UDS negative  VTE prophylaxis - SCDs  Diet - Heart healthy with thin liquids.  No antithrombotic prior to admission, now on aspirin 325 mg daily and clopidogrel 75 mg daily.  Given intracranial stenosis/occlusion, recommend DAPT for 3 months and then Plavix or aspirin alone.  Patient counseled to be compliant with her antithrombotic medications  Ongoing aggressive stroke risk factor management  Therapy recommendations:  pending  Disposition:  Pending  History of traumatic ICH/SDH  03/2017 -admitted for  confusion, vomiting, CT showed left temporal lobe ICH with left SDH with right scalp hematoma, likely traumatic related.  Put on Keppra for seizure prevention.  05/2017 follow with Dr. Karel Jarvis repeat CT showed ICH and SDH resolution, but left BG/called to the head chronic infarct.  EEG showed occasional left temporal slowing.  Plan to taper off Keppra.  However patient still on Keppra up to date  Continue Keppra home dose.  Continue follow-up with Dr. Karel Jarvis  Hypertension  Stable on the high side . Permissive hypertension (OK if < 220/120) but gradually normalize in 5-7 days . Long-term BP goal normotensive  Hyperlipidemia  Lipid lowering medication PTA:  none  LDL 168, goal < 70  Current lipid lowering medication: Lipitor 80 mg daily  Continue statin at discharge  Other Stroke Risk Factors  Advanced age  Hx stroke/TIA  by imaging  Other Active Problems  TSH 10.41 on admission  Elevated TG 291  Autism spectrum disorder  Deafness  Mild mental retardation   Hospital day # 3  Neurology will sign off. Please call with questions. [facility]. Thanks for the consult.  Marvel PlanJindong Laura Siordia, MD PhD Stroke Neurology 07/17/2018 4:01 PM  I spent  35 minutes in total face-to-face time with the patient, more than 50% of which was spent in counseling and coordination of care, reviewing test results, images and medication, and discussing the diagnosis of left ACA stroke, cardiac arrhythmia concerning for A. fib, hyperlipidemia treatment plan and potential prognosis. This patient's care requiresreview of multiple databases, neurological assessment, discussion with family, other specialists and medical decision making of high complexity.       To contact Stroke Continuity provider, please refer to [email or web address]. After hours, contact General Neurology

## 2018-07-17 NOTE — Progress Notes (Signed)
PROGRESS NOTE  Hewitt Shortsancy M Westbrook ZOX:[id number] DOB: 16-Sep-1941 DOA: 07/14/2018 PCP: Romero BellingEllison, Sean, MD  Brief history:  H/o autism and  Deafness, from From independent living with weakness and encephalopathyx2weeks Found to have Bradycardia and acute CVA   HPI/Recap of past 24 hours:  She is returned from venous doppler  Family  at bedside  Sign language interpretor at bedside assist translating  She reports now she is feeling fine, she denies pain, no acute complaints     Assessment/Plan: Principal Problem:   Acute encephalopathy Active Problems:   Hypothyroidism   Essential hypertension   Abnormal involuntary movement   CVA (cerebral vascular accident) (HCC)   Bradycardia   Autism spectrum disorder   Deaf   Acute CVA/Encephalopathy  -presenting symptom of dizziness, fatigue, gait instability and speech (sign language) patten changes -No source of infection  -eeg negative.  -uds negative -MRI " 1. 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 2. Remote left temporal lobe hemorrhage and left lateral lenticulostriate infarct." -TTE echocardiogram no report of embolic source -carotid us ordered,  -ldl 168 -a1c 6.7 --neurology consulted, she is started on asa 325 and plavix 75daily, continue statin, venous doppler negative for DVt -appreciate neurology input, will follow neurology recommendation   Bradycardia/arrythmia: -presenting symptom of dizziness, fatigue, increased gait instability, not sure if related to cardiac or neuro -no reported syncope initially, on further question, family reports patient is found down in the elevator prior to coming to the hospital, they are not sure what cause the fall, they did notice patient has increased gait instability recently -patient  has h/o falls with subdural hematoma in the past -bp stable -Keep on tele -Echo lvef wnl -case discussed with cardiology Dr Ladona Ridgelaylor   HTN:  allow permissive  hypertension due to actue cva Gradually lower bp to normal  tsh elevated Patient reports she has not been taking synthroid , restart home does synthroid  H/o CVA? Per chart and subdural hematoma, was started on keppra for seizure prevention in 03/2017  Autism/deaf -from independent living Use sign language   Code Status: full  Family Communication: patient and family at bedside  Disposition Plan: CIR Vs SNF   Consultants:  Neurology  cardiology  Procedures:  none  Antibiotics:  none   Objective: BP 131/60 (BP Location: Right Arm)   Pulse 98   Temp 98.8 F (37.1 C) (Oral)   Resp 20   Ht 5\' 3"  (1.6 m)   Wt 57.3 kg   SpO2 100%   BMI 22.38 kg/m   Intake/Output Summary (Last 24 hours) at 07/17/2018 0757 Last data filed at 07/17/2018 0500 Gross per 24 hour  Intake 1228.96 ml  Output -  Net 1228.96 ml   Filed Weights   07/14/18 1813  Weight: 57.3 kg    Exam: Patient is examined daily including today on 07/17/2018, exams remain the same as of yesterday except that has changed    General:  NAD, able to communicate through sign language  Cardiovascular: RRR  Respiratory: CTABL  Abdomen: Soft/ND/NT, positive BS  Musculoskeletal: No Edema  Neuro: alert, oriented   Data Reviewed: Basic Metabolic Panel: Recent Labs  Lab 07/14/18 1842 07/14/18 2350 07/16/18 0341  NA 137  --  139  K 4.2  --  4.2  CL 100  --  105  CO2 30  --  26  GLUCOSE 102*  --  122*  BUN 18  --  15  CREATININE 0.83  --  0.82  CALCIUM 8.6*  --  8.4*  MG  --  1.9  --    Liver Function Tests: Recent Labs  Lab 07/14/18 1842  AST 25  ALT 14  ALKPHOS 57  BILITOT 0.6  PROT 6.6  ALBUMIN 3.5   No results for input(s): LIPASE, AMYLASE in the last 168 hours. Recent Labs  Lab 07/14/18 2350  AMMONIA 24   CBC: Recent Labs  Lab 07/14/18 1842  WBC 7.5  NEUTROABS 4.7  HGB 14.4  HCT 45.8  MCV 94.2  PLT 223   Cardiac Enzymes:   No results for input(s): CKTOTAL, CKMB,  CKMBINDEX, TROPONINI in the last 168 hours. BNP (last 3 results) No results for input(s): BNP in the last 8760 hours.  ProBNP (last 3 results) No results for input(s): PROBNP in the last 8760 hours.  CBG: Recent Labs  Lab 07/14/18 1932 07/16/18 1129 07/16/18 1644 07/16/18 2120  GLUCAP 96 137* 111* 186*    Recent Results (from the past 240 hour(s))  Urine culture     Status: Abnormal   Collection Time: 07/14/18  9:23 PM  Result Value Ref Range Status   Specimen Description URINE, RANDOM  Final   Special Requests unknown Normal  Final   Culture (A)  Final    <10,000 COLONIES/mL INSIGNIFICANT GROWTH Performed at [facility], [address], [place], [place] [address]    Report Status 07/16/2018 FINAL  Final     Studies: Ct Angio Head W Or Wo Contrast  Result Date: 07/16/2018 CLINICAL DATA:  Acute infarctions in the left anterior cerebral artery territory demonstrated by MRI yesterday. EXAM: CT ANGIOGRAPHY HEAD AND NECK TECHNIQUE: Multidetector CT imaging of the head and neck was performed using the standard protocol during bolus administration of intravenous contrast. Multiplanar CT image reconstructions and MIPs were obtained to evaluate the vascular anatomy. Carotid stenosis measurements (when applicable) are obtained utilizing NASCET criteria, using the distal internal carotid diameter as the denominator. CONTRAST:  50mL ISOVUE-370 IOPAMIDOL (ISOVUE-370) INJECTION 76% COMPARISON:  MR and CT examinations done 07/15/2018 and 07/14/2018. FINDINGS: CTA NECK FINDINGS Aortic arch: Ectasia of the aortic after check without dissection. Right carotid system: Common carotid artery is patent to the bifurcation region which is low within the neck. There is atherosclerotic plaque at the carotid bifurcation but there is no stenosis or irregularity. Cervical ICA is widely patent. Left carotid system: Common carotid artery is widely patent to the bifurcation region. There is atherosclerotic  plaque at the carotid bifurcation but no stenosis or irregularity. Cervical ICA is widely patent. Vertebral arteries: There is some atherosclerotic plaque of both proximal subclavian arteries but no flow limiting stenosis. Right vertebral artery is widely patent. Left vertebral artery shows a small calcified plaque with 30% stenosis at the origin. Beyond that, both vertebral arteries are widely patent through the cervical region to the foramen magnum. 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 Upper chest: Normal Review of the MIP images confirms the above findings CTA HEAD FINDINGS Anterior circulation: Both internal carotid arteries are patent through the skull base and siphon regions without stenosis. The anterior and middle cerebral vessels are patent without proximal stenosis, aneurysm or vascular malformation. The left anterior cerebral artery is occluded in the A2 segment. There is thready reconstitution beyond that initial occlusion. Posterior circulation: Both vertebral arteries are patent to the basilar. No basilar stenosis. Posterior circulation branch vessels are normal.  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 Venous sinuses: Patent and normal. Anatomic variants: None other significant. 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 Delayed phase: No abnormal enhancement. 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 Minimal atherosclerotic disease at both carotid bifurcations but without stenosis or significant irregularity. Occlusion of the left anterior cerebral artery in the A2 segment with thready reconstitution beyond that. Atherosclerotic disease of both subclavian arteries proximal to the vertebral  artery origins, but without flow limiting stenosis. 30% stenosis of the left vertebral artery origin. 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 Electronically Signed   By: Paulina Fusi M.D.   On: 07/16/2018 16:16   Ct Angio Neck W Or Wo Contrast  Result Date: 07/16/2018 CLINICAL DATA:  Acute infarctions in the left anterior cerebral artery territory demonstrated by MRI yesterday. EXAM: CT ANGIOGRAPHY HEAD AND NECK TECHNIQUE: Multidetector CT imaging of the head and neck was performed using the standard protocol during bolus administration of intravenous contrast. Multiplanar CT image reconstructions and MIPs were obtained to evaluate the vascular anatomy. Carotid stenosis measurements (when applicable) are obtained utilizing NASCET criteria, using the distal internal carotid diameter as the denominator. CONTRAST:  50mL ISOVUE-370 IOPAMIDOL (ISOVUE-370) INJECTION 76% COMPARISON:  MR and CT examinations done 07/15/2018 and 07/14/2018. FINDINGS: CTA NECK FINDINGS Aortic arch: Ectasia of the aortic after check without dissection. Right carotid system: Common carotid artery is patent to the bifurcation region which is low within the neck. There is atherosclerotic plaque at the carotid bifurcation but there is no stenosis or irregularity. Cervical ICA is widely patent. Left carotid system: Common carotid artery is widely patent to the bifurcation region. There is atherosclerotic plaque at the carotid bifurcation but no stenosis or irregularity. Cervical ICA is widely patent. Vertebral arteries: There is some atherosclerotic plaque of both proximal subclavian arteries but no flow limiting stenosis. Right vertebral artery is widely patent. Left vertebral artery shows a small calcified plaque with 30% stenosis at the origin. Beyond that, both vertebral arteries are widely patent through the cervical region to the foramen magnum. 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 Upper chest: Normal Review of the MIP images confirms the above findings CTA HEAD FINDINGS Anterior circulation: Both internal carotid arteries are patent through the skull base and siphon regions without stenosis. The anterior and middle cerebral vessels are patent without proximal stenosis, aneurysm or vascular malformation. The left anterior cerebral artery is occluded in the A2 segment. There is thready reconstitution beyond that initial occlusion. Posterior circulation: Both vertebral arteries are patent to the basilar. No basilar stenosis. Posterior circulation branch vessels are normal. 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 Venous sinuses: Patent and normal. Anatomic variants: None other significant. 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 Delayed phase: No abnormal enhancement. 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 Minimal atherosclerotic disease at both carotid bifurcations but without stenosis or significant irregularity. Occlusion of the left anterior cerebral artery in the A2 segment with thready reconstitution beyond that. Atherosclerotic disease of both subclavian arteries proximal to the vertebral artery origins, but without  flow limiting stenosis. 30% stenosis of the left vertebral artery origin. 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 Electronically Signed   By: Paulina Fusi M.D.   On: 07/16/2018 16:16    Scheduled Meds: . aspirin EC  325 mg Oral Daily  . atorvastatin  80 mg Oral q1800  . clonazePAM  0.25 mg Oral QHS  . clopidogrel  75 mg Oral Daily  . hydrALAZINE  25 mg Oral Q8H  . levETIRAcetam  500 mg Oral BID  . levothyroxine  50 mcg Oral QAC breakfast      Continuous Infusions:    Time spent: >29mins, case discussed with neurology  I have personally reviewed and interpreted on  07/17/2018 daily labs, tele strips, imagings as discussed above under date review session and assessment and plans.  I reviewed all nursing notes, pharmacy notes, consultant notes,  vitals, pertinent old records  I have discussed plan of care as described above with RN , patient and family on 07/17/2018   Albertine Grates MD, PhD  [facility] Pager [phone]. If 7PM-7AM, please contact night-coverage at [email or web address], password [facility] 07/17/2018, 7:57 AM  LOS: 3 days

## 2018-07-17 NOTE — Progress Notes (Signed)
VASCULAR LAB PRELIMINARY  PRELIMINARY  PRELIMINARY  PRELIMINARY  Bilateral lower extremity venous duplex completed.    Preliminary report:  There is no DVT or SVT noted in the bilateral lower extremities.   Annaly Skop, RVT 07/17/2018, 11:50 AM

## 2018-07-18 ENCOUNTER — Telehealth: Payer: Self-pay | Admitting: Physician Assistant

## 2018-07-18 ENCOUNTER — Inpatient Hospital Stay (HOSPITAL_COMMUNITY): Payer: Medicare Other

## 2018-07-18 LAB — BASIC METABOLIC PANEL
ANION GAP: 9 (ref 5–15)
BUN: 12 mg/dL (ref 8–23)
CALCIUM: 8.2 mg/dL — AB (ref 8.9–10.3)
CO2: 25 mmol/L (ref 22–32)
Chloride: 104 mmol/L (ref 98–111)
Creatinine, Ser: 0.67 mg/dL (ref 0.44–1.00)
GFR calc non Af Amer: >60 mL/min (ref >60)
Glucose, Bld: 133 mg/dL — ABNORMAL HIGH (ref 70–99)
POTASSIUM: 4 mmol/L (ref 3.5–5.1)
Sodium: 138 mmol/L (ref 135–145)

## 2018-07-18 LAB — MAGNESIUM: Magnesium: 1.8 mg/dL (ref 1.7–2.4)

## 2018-07-18 LAB — CBC WITH DIFFERENTIAL/PLATELET
Abs Immature Granulocytes: 0 10*3/uL (ref 0.0–0.1)
Basophils Absolute: 0.1 10*3/uL (ref 0.0–0.1)
Basophils Relative: 1 %
Eosinophils Absolute: 0.3 10*3/uL (ref 0.0–0.7)
Eosinophils Relative: 3 %
HEMATOCRIT: 48.3 % — AB (ref 36.0–46.0)
HEMOGLOBIN: 15.4 g/dL — AB (ref 12.0–15.0)
IMMATURE GRANULOCYTES: 0 %
LYMPHS ABS: 1.1 10*3/uL (ref 0.7–4.0)
LYMPHS PCT: 12 %
MCH: 29.7 pg (ref 26.0–34.0)
MCHC: 31.9 g/dL (ref 30.0–36.0)
MCV: 93.1 fL (ref 78.0–100.0)
Monocytes Absolute: 1.2 10*3/uL — ABNORMAL HIGH (ref 0.1–1.0)
Monocytes Relative: 13 %
NEUTROS ABS: 6.6 10*3/uL (ref 1.7–7.7)
NEUTROS PCT: 71 %
Platelets: 232 10*3/uL (ref 150–400)
RBC: 5.19 MIL/uL — AB (ref 3.87–5.11)
RDW: 12.7 % (ref 11.5–15.5)
WBC: 9.3 10*3/uL (ref 4.0–10.5)

## 2018-07-18 LAB — GLUCOSE, CAPILLARY
GLUCOSE-CAPILLARY: 103 mg/dL — AB (ref 70–99)
Glucose-Capillary: 283 mg/dL — ABNORMAL HIGH (ref 70–99)

## 2018-07-18 MED ORDER — LEVETIRACETAM IN NACL 500 MG/100ML IV SOLN
500.0000 mg | Freq: Two times a day (BID) | INTRAVENOUS | Status: DC
  Administered 2018-07-18 – 2018-07-19 (×2): 500 mg via INTRAVENOUS
  Filled 2018-07-18 (×2): qty 100

## 2018-07-18 MED ORDER — SODIUM CHLORIDE 0.9 % IV BOLUS
500.0000 mL | Freq: Once | INTRAVENOUS | Status: AC
  Administered 2018-07-18: 500 mL via INTRAVENOUS

## 2018-07-18 MED ORDER — SODIUM CHLORIDE 0.9 % IV SOLN
INTRAVENOUS | Status: DC
  Administered 2018-07-18 – 2018-07-19 (×2): via INTRAVENOUS

## 2018-07-18 NOTE — Progress Notes (Signed)
STROKE TEAM PROGRESS NOTE   SUBJECTIVE (INTERVAL HISTORY) Her friend are at the bedside. Last night event noted. Pt had hydralazine IV 10mg  treatment for high BP, and SBP from 180s down to 110s in a short time. Pt was found to have worsening LUE and LLE weakness. PT/OT today recommend CIR now. Will do a limited MRI brain for evaluation.    OBJECTIVE Vitals:   07/18/18 0615 07/18/18 0645 07/18/18 0849 07/18/18 1121  BP: 128/76 (!) 147/82 140/88 133/72  Pulse: 98 (!) 102 (!) 118 (!) 102  Resp:   18 18  Temp:   97.7 F (36.5 C) 98.3 F (36.8 C)  TempSrc:   Axillary Oral  SpO2:   100%   Weight:      Height:        CBC:  Recent Labs  Lab 07/14/18 1842 07/18/18 0433  WBC 7.5 9.3  NEUTROABS 4.7 6.6  HGB 14.4 15.4*  HCT 45.8 48.3*  MCV 94.2 93.1  PLT 223 232    Basic Metabolic Panel:  Recent Labs  Lab 07/14/18 2350 07/16/18 0341 07/18/18 0433  NA  --  139 138  K  --  4.2 4.0  CL  --  105 104  CO2  --  26 25  GLUCOSE  --  122* 133*  BUN  --  15 12  CREATININE  --  0.82 0.67  CALCIUM  --  8.4* 8.2*  MG 1.9  --  1.8    Lipid Panel:     Component Value Date/Time   CHOL 268 (H) 07/16/2018 0341   TRIG 291 (H) 07/16/2018 0341   HDL 42 07/16/2018 0341   CHOLHDL 6.4 07/16/2018 0341   VLDL 58 (H) 07/16/2018 0341   LDLCALC 168 (H) 07/16/2018 0341   HgbA1c:  Lab Results  Component Value Date   HGBA1C 6.7 (H) 07/16/2018   Urine Drug Screen:     Component Value Date/Time   LABOPIA NONE DETECTED 07/15/2018 0755   COCAINSCRNUR NONE DETECTED 07/15/2018 0755   LABBENZ NONE DETECTED 07/15/2018 0755   AMPHETMU NONE DETECTED 07/15/2018 0755   THCU NONE DETECTED 07/15/2018 0755   LABBARB NONE DETECTED 07/15/2018 0755    Alcohol Level No results found for: ETH  IMAGING  Ct Angio Head W Or Wo Contrast Ct Angio Neck W Or Wo Contrast 07/16/2018 IMPRESSION:  Aortic ectasia at the arch with mild atherosclerotic plaque but no dissection or pronounced irregularity.   Minimal atherosclerotic disease at both carotid bifurcations but without stenosis or significant irregularity.  Occlusion of the left anterior cerebral artery in the A2 segment with thready reconstitution beyond that.  Atherosclerotic disease of both subclavian arteries proximal to the vertebral artery origins, but without flow limiting stenosis.  30% stenosis of the left vertebral artery origin.  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   Mr Brain Wo Contrast 07/15/2018 IMPRESSION:  1. 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  2. Remote left temporal lobe hemorrhage and left lateral lenticulostriate infarct.   Transthoracic Echocardiogram  07/15/2018 Study Conclusions - Left ventricle: The cavity size was normal. Systolic function was   normal. The estimated ejection fraction was in the range of 55%   to 60%. Wall motion was normal; there were no regional wall   motion abnormalities. Doppler parameters are consistent with   abnormal left ventricular relaxation (grade 1 diastolic   dysfunction). Doppler parameters are consistent with elevated   mean left atrial  filling pressure. - Left atrium: The atrium was mildly dilated. Impressions: - Incessant PAC and blocked PACs are seen throughout the study.  Bilateral LE Venous Dopplers - There is no DVT or SVT noted in the bilateral lower extremities.   MRI limited pending   PHYSICAL EXAM Blood pressure 133/72, pulse (!) 102, temperature 98.3 F (36.8 C), temperature source Oral, resp. rate 18, height 5\' 3"  (1.6 m), weight 57.3 kg, SpO2 100 %.   Temp:  [97.5 F (36.4 C)-98.3 F (36.8 C)] 98.3 F (36.8 C) (08/26 1121) Pulse Rate:  [92-118] 102 (08/26 1121) Resp:  [18-22] 18 (08/26 1121) BP: (111-180)/(70-106) 133/72 (08/26 1121) SpO2:  [94 %-100 %] 100 % (08/26 0849)  General - Well nourished, well developed, in no apparent distress, facial features of mental  retardation.  Ophthalmologic - fundi not visualized due to noncooperation.  Cardiovascular - irregular rhythm with frequent premature beats.  Neuro - awake, alert, nonverbal, deafness.  She is orientated to place, people, but not orientated to time her age.  Visual field intact on confrontation, PERRL, EOMI, tracking objects.  Facial symmetric, tongue midline.  left upper extremity 4+/5, and RUE 4-/5 with drift, left lower extremity 4/5 proximal and distal, right lower extremity 3-/5 proximal and distal.  Bilateral upper extremity coordination intact.  Sensation and gait not able to test.   ASSESSMENT/PLAN Ms. ALEA RYER is a 77 y.o. female with history of autism spectrum disorder, deafness, HTN,HLD,ICH/SDHin May 2018, hypothyroidism, chronic tremor  presenting with AMS. She did not receive IV t-PA due to late presentation and no focal deficits.  Stroke: Left ACA scattered infarcts, embolic pattern -  source unknown but concerning for cardiac arrhythmia  CT head - No evidence of acute intracranial abnormality.  MRI head - Cluster of small acute infarcts in the distal left ACA territory.   CTA H&N - Occlusion of the left ACA A2 segment   LE venous Doppler no DVT  Recommend 30-day cardio event monitor as outpatient to rule out A. fib  2D Echo  - EF 55 - 60%. No cardiac source of emboli identified.   LDL - 168   HgbA1c - 6.7  UDS negative  VTE prophylaxis - SCDs  Diet - Heart healthy with thin liquids.  No antithrombotic prior to admission, now on aspirin 325 mg daily and clopidogrel 75 mg daily.  Given intracranial stenosis/occlusion, recommend DAPT for 3 months and then Plavix or aspirin alone.  Patient counseled to be compliant with her antithrombotic medications  Ongoing aggressive stroke risk factor management  Therapy recommendations: CIR  Disposition:  Pending  Worsening left sided hemiparesis  Likely related to hypoperfusion due to abrupt BP drop with  hydralazine  PT/OT   Will repeat MRI limited (DWI and ADC)  Continue current management  History of traumatic ICH/SDH  03/2017 -admitted for confusion, vomiting, CT showed left temporal lobe ICH with left SDH with right scalp hematoma, likely traumatic related.  Put on Keppra for seizure prevention.  05/2017 follow with Dr. Karel Jarvis repeat CT showed ICH and SDH resolution, but left BG/called to the head chronic infarct.  EEG showed occasional left temporal slowing.  Plan to taper off Keppra.  However patient still on Keppra up to date  Continue Keppra home dose.  Continue follow-up with Dr. Karel Jarvis  Hypertension  Stable  Permissive hypertension (OK if < 220/120) but gradually normalize in 5-7 days  Avoid hypotension  On low dose hydralazine, on hold for now . Long-term BP goal normotensive  Hyperlipidemia  Lipid lowering medication PTA:  none  LDL 168, goal < 70  Current lipid lowering medication: Lipitor 80 mg daily  Continue statin at discharge  Other Stroke Risk Factors  Advanced age  Hx stroke/TIA by imaging  Other Active Problems  TSH 10.41 on admission  Elevated TG 291  Autism spectrum disorder  Deafness  Mild mental retardation   Hospital day # 4   Marvel PlanJindong Myreon Wimer, MD PhD Stroke Neurology 07/18/2018 1:47 PM   To contact Stroke Continuity provider, please refer to [email or web address]. After hours, contact General Neurology

## 2018-07-18 NOTE — Plan of Care (Signed)
MRI reviewed and it showed mild extension of distal left ACA territory infarct. Still within the same vessel distribution. It is most likely the stroke extension was caused by abrupt BP decrease last night. Treatment plan remains the same, continue DAPT for 3 months and then plavix alone. Continue statin. Avoid hypotension, aggressive PT/OT. 30 day cardiac event monitoring as outpt to rule out afib.   Relayed result to pt POA over the phone. She mentioned that pt has some emotional changes and not talking meds from apple sauce. I let her know that this could be part of the frontal stroke symptom too in addition to her autism spectrum disorder. We hope her emotional change would be getting better over time as stroke improves. She expressed understanding and appreciation. Case also discussed with Dr. Albertine GratesFang Mauricio Dahlen.   Neurology will sign off. Please call with questions. [facility]. Thanks for the consult.  Marvel PlanJindong [place] Sloop, MD PhD Stroke Neurology 07/18/2018 4:26 PM

## 2018-07-18 NOTE — Progress Notes (Signed)
PROGRESS NOTE  Laura [name]:[id number] DOB: 02/16/1941 DOA: 07/14/2018 PCP: Romero Belling, MD  Brief history:  H/o autism and  Deafness, from From independent living with weakness and encephalopathyx2weeks Found to have Bradycardia and acute CVA   HPI/Recap of past 24 hours:  Overnight event noted, right sided weakness and brief LOC when she is using BSC at 5;30 Per RN prior to that she received iv hydralazine for sbp >180, her sbp decreased to 110 after hydralazine She received 500bolus at around 6:20 per neurology    Per caregiver, patient has become less interactive yesterday evening after rehab MD saw her  Sign language interpretor at bedside assist translating   She is less interactive , she does not follow commands today during exam   Assessment/Plan: Principal Problem:   Acute encephalopathy Active Problems:   Hypothyroidism   Essential hypertension   Abnormal involuntary movement   CVA (cerebral vascular accident) (HCC)   Bradycardia   Autism spectrum disorder   Deaf   History of CVA (cerebrovascular accident) without residual deficits   Sinus tachycardia   Diabetes mellitus type 2 in nonobese (HCC)   Acute CVA/Encephalopathy  -presenting symptom of dizziness, fatigue, gait instability and speech (sign language) patten changes -No source of infection  -eeg negative.  -uds negative -MRI " 1. 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 2. Remote left temporal lobe hemorrhage and left lateral lenticulostriate infarct." -TTE echocardiogram no report of embolic source -carotid US ordered,  -ldl 168 -a1c 6.7 --neurology consulted, she is started on asa 325 and plavix 75daily, continue statin, venous doppler negative for DVt -appreciate neurology input, will follow neurology recommendation   Bradycardia/arrythmia: -presenting symptom of dizziness, fatigue, increased gait instability, not sure if related to cardiac or neuro -no  reported syncope initially, on further question, family reports patient is found down in the elevator prior to coming to the hospital, they are not sure what cause the fall, they did notice patient has increased gait instability recently -patient  has h/o falls with subdural hematoma in the past -bp stable -Keep on tele -Echo lvef wnl -cardiology consulted, recommend outpatient cardiac monitor   HTN:  allow permissive hypertension due to actue cva Gradually lower bp to normal  tsh elevated Patient reports she has not been taking synthroid , restart home does synthroid  H/o subdural hematoma, s/p fall in 03/2017, was started on keppra for seizure prevention in 03/2017  Autism/deaf -from independent living Use sign language   Code Status: full  Family Communication: patient and family at bedside  Disposition Plan: CIR Vs SNF once cleared by neurology   Consultants:  Neurology  Cardiology  CIR  Procedures:  none  Antibiotics:  none   Objective: BP 133/72 (BP Location: Left Arm)   Pulse (!) 102   Temp 98.3 F (36.8 C) (Oral)   Resp 18   Ht 5\' 3"  (1.6 m)   Wt 57.3 kg   SpO2 100%   BMI 22.38 kg/m   Intake/Output Summary (Last 24 hours) at 07/18/2018 1134 Last data filed at 07/18/2018 0845 Gross per 24 hour  Intake 560 ml  Output -  Net 560 ml   Filed Weights   07/14/18 1813  Weight: 57.3 kg    Exam: Patient is examined daily including today on 07/18/2018, exams remain the same as of yesterday except that has changed    General:  NAD, she is less interactive today  Cardiovascular: RRR  Respiratory: CTABL  Abdomen: Soft/ND/NT, positive BS  Musculoskeletal: No Edema  Neuro: alert, less interactive,   Data Reviewed: Basic Metabolic Panel: Recent Labs  Lab 07/14/18 1842 07/14/18 2350 07/16/18 0341 07/18/18 0433  NA 137  --  139 138  K 4.2  --  4.2 4.0  CL 100  --  105 104  CO2 30  --  26 25  GLUCOSE 102*  --  122* 133*  BUN 18  --  15  12  CREATININE 0.83  --  0.82 0.67  CALCIUM 8.6*  --  8.4* 8.2*  MG  --  1.9  --  1.8   Liver Function Tests: Recent Labs  Lab 07/14/18 1842  AST 25  ALT 14  ALKPHOS 57  BILITOT 0.6  PROT 6.6  ALBUMIN 3.5   No results for input(s): LIPASE, AMYLASE in the last 168 hours. Recent Labs  Lab 07/14/18 2350  AMMONIA 24   CBC: Recent Labs  Lab 07/14/18 1842 07/18/18 0433  WBC 7.5 9.3  NEUTROABS 4.7 6.6  HGB 14.4 15.4*  HCT 45.8 48.3*  MCV 94.2 93.1  PLT 223 232   Cardiac Enzymes:   No results for input(s): CKTOTAL, CKMB, CKMBINDEX, TROPONINI in the last 168 hours. BNP (last 3 results) No results for input(s): BNP in the last 8760 hours.  ProBNP (last 3 results) No results for input(s): PROBNP in the last 8760 hours.  CBG: Recent Labs  Lab 07/17/18 1217 07/17/18 1634 07/17/18 2124 07/18/18 0720 07/18/18 1056  GLUCAP 132* 192* 171* 103* 283*    Recent Results (from the past 240 hour(s))  Urine culture     Status: Abnormal   Collection Time: 07/14/18  9:23 PM  Result Value Ref Range Status   Specimen Description URINE, RANDOM  Final   Special Requests unknown Normal  Final   Culture (A)  Final    <10,000 COLONIES/mL INSIGNIFICANT GROWTH Performed at [facility], [address] [place], [place] [address]    Report Status 07/16/2018 FINAL  Final     Studies: No results found.  Scheduled Meds: . aspirin EC  325 mg Oral Daily  . atorvastatin  80 mg Oral q1800  . clonazePAM  0.25 mg Oral QHS  . clopidogrel  75 mg Oral Daily  . hydrALAZINE  25 mg Oral Q8H  . levETIRAcetam  500 mg Oral BID  . levothyroxine  50 mcg Oral QAC breakfast    Continuous Infusions:    Time spent: >6735mins, case discussed with neurology  I have personally reviewed and interpreted on  07/18/2018 daily labs, tele strips, imagings as discussed above under date review session and assessment and plans.  I reviewed all nursing notes, pharmacy notes, consultant notes,   vitals, pertinent old records  I have discussed plan of care as described above with RN , patient and family on 07/18/2018   Albertine GratesFang Ahni Bradwell MD, PhD  [facility] Pager [phone]. If 7PM-7AM, please contact night-coverage at [email or web address], password [email or web address] 07/18/2018, 11:34 AM  LOS: 4 days

## 2018-07-18 NOTE — Progress Notes (Signed)
Physical Therapy Treatment Patient Details Name: Laura [name] MRN: [id number] DOB: 27-Oct-1941 Today's Date: 07/18/2018    History of Present Illness 77 y.o. female admitted with 2 week history of encephalopathy found to have acute CVA and bradycardia. PMH includes but not limited to: Autism, Deafness, prior CVA, tremor, hypothyroidism, HTN, HLD.     PT Comments    Patient seen for mobility progression. Pt requires mod/max multimodal cues for initiation and follow through of tasks with assistance of sign language interpreter, Felicia. Pt requires assistance for all OOB mobility given impaired balance and safety awareness. Per pt's caregivers in room pt is much slower to process than at baseline since yesterday afternoon. RN aware of change in mobility and cognition since previously noted session. Continue to progress as tolerated.   Follow Up Recommendations  CIR     Equipment Recommendations  Other (comment)(TBD)    Recommendations for Other Services Rehab consult;OT consult     Precautions / Restrictions Precautions Precautions: Fall Restrictions Weight Bearing Restrictions: No    Mobility  Bed Mobility Overal bed mobility: Needs Assistance Bed Mobility: Sit to Supine     Supine to sit: Max assist Sit to supine: Min assist   General bed mobility comments: pt sitting EOB with NT present upon arrival; pt requires assistance to bring bilat LE into bed and max multimodal cues for initiating task  Transfers Overall transfer level: Needs assistance Equipment used: Rolling walker (2 wheeled);1 person hand held assist Transfers: Sit to/from Stand Sit to Stand: Min assist;Mod assist         General transfer comment: min A from EOB and mod A from commode with use of grab bar  Ambulation/Gait Ambulation/Gait assistance: Min assist;+2 physical assistance;Mod assist Gait Distance (Feet): (75) Assistive device: Rolling walker (2 wheeled);1 person hand held assist;2 person  hand held assist Gait Pattern/deviations: Step-to pattern;Decreased step length - right;Decreased stride length;Decreased dorsiflexion - right Gait velocity: decreased   General Gait Details: initially ambulating with RW however RW seems to distract pt more than assist with balance; pt able to ambulate with mod A +1 and/or min A +2 HHA; max cues for navigating environment and for increased R LE step lengths; pt at times leaving R LE behind    Stairs             Wheelchair Mobility    Modified Rankin (Stroke Patients Only)       Balance Overall balance assessment: Needs assistance Sitting-balance support: Feet supported Sitting balance-Leahy Scale: Fair     Standing balance support: Bilateral upper extremity supported;During functional activity Standing balance-Leahy Scale: Poor Standing balance comment: Requires UE support.                             Cognition Arousal/Alertness: Awake/alert Behavior During Therapy: Flat affect Overall Cognitive Status: Impaired/Different from baseline Area of Impairment: Attention;Problem solving;Following commands;Safety/judgement;Awareness                   Current Attention Level: Sustained   Following Commands: Follows one step commands inconsistently;Follows one step commands with increased time Safety/Judgement: Decreased awareness of safety Awareness: Intellectual Problem Solving: Decreased initiation;Slow processing;Difficulty sequencing General Comments: pt is very easily distracted by environment and preoccupied with telemetry box; Pt has autism at baseline but has demonstrated cognitive decline beginning yesterday 8/25 per family present. Pt requiring mod - max cuing from interpreter. Pt required increased time to complete all tasks  Exercises      General Comments General comments (skin integrity, edema, etc.): HR into 120s      Pertinent Vitals/Pain Pain Assessment: No/denies pain    Home  Living                      Prior Function            PT Goals (current goals can now be found in the care plan section) Acute Rehab PT Goals Patient Stated Goal: none stated by pt Progress towards PT goals: Progressing toward goals    Frequency    Min 3X/week      PT Plan Current plan remains appropriate    Co-evaluation              AM-PAC PT "6 Clicks" Daily Activity  Outcome Measure  Difficulty turning over in bed (including adjusting bedclothes, sheets and blankets)?: A Lot Difficulty moving from lying on back to sitting on the side of the bed? : Unable Difficulty sitting down on and standing up from a chair with arms (e.g., wheelchair, bedside commode, etc,.)?: Unable Help needed moving to and from a bed to chair (including a wheelchair)?: A Little Help needed walking in hospital room?: A Lot Help needed climbing 3-5 steps with a railing? : A Lot 6 Click Score: 11    End of Session Equipment Utilized During Treatment: Gait belt Activity Tolerance: Patient tolerated treatment well Patient left: in bed;with call bell/phone within reach;with bed alarm set;with family/visitor present Nurse Communication: Mobility status PT Visit Diagnosis: Unsteadiness on feet (R26.81);History of falling (Z91.81);Muscle weakness (generalized) (M62.81);Difficulty in walking, not elsewhere classified (R26.2)     Time: [id number] PT Time Calculation (min) (ACUTE ONLY): 22 min  Charges:  $Gait Training: 8-22 mins                     Erline LevineKellyn Joanie Duprey, PTA Pager: [phone]     Carolynne EdouardKellyn R Tylisha Danis 07/18/2018, 2:15 PM

## 2018-07-18 NOTE — Progress Notes (Signed)
Pt observed to have a change in LOC when using the BSC at 0530, unable to follow command with right sided weakness, pt assisited back to bed, BP read 111/79, pt earlier had iv hydralazine 10mg  at 0433, Dr Amada JupiterKirkpatrick paged and notified, came up to see pt and ordered 500cc NS Bolus, same given at 0624, pt's POA Ms Earlene PlaterDavis also called to be notified, voice message dropped, [facility], will continue to monitor. Obasogie-Asidi, Subrena Devereux Efe

## 2018-07-18 NOTE — Progress Notes (Addendum)
Pt's BP read 175/101, iv hydralazine 10mg  given at 0433 as ordered, pt observed to be wet, cleaned up with new sheets and was made comfortable in bed, will however continue to monitor. Obasogie-Asidi, Ronella Plunk Efe

## 2018-07-18 NOTE — Progress Notes (Signed)
Following hydralazine earlier tonight, the patient became less responsive and weaker on the right.   On exam, she is 1/5 RLE, and 3/5 RUE.   She does not follow commands, though unclear if due to language deficit or due to her baseline barrier to communication.   I suspect that she is poorly perfusing due to the abrupt drop in BP from IV hydralazine.   1) Permissive hypertension, I have d/ced PRN hydralazine 2) NS bolus stat x 1 3) will follow.   Ritta SlotMcNeill Chaddrick Brue, MD [facility] [phone]  If 7pm- 7am, please page neurology on call as listed in AMION.

## 2018-07-18 NOTE — Care Management Important Message (Signed)
Important Message  Patient Details  Name: Laura [name] MRN: [id number] Date of Birth: October 16, 1941   Medicare Important Message Given:  Yes    Dorena BodoIris Ena Demary 07/18/2018, 3:55 PM

## 2018-07-18 NOTE — Telephone Encounter (Signed)
called to schedule monitor and her interpreter advised she would not be albe to were the monitor the pt would be pulling the leads off she cant stand to have anything on her that does not belong,  they had a hard time in the hospital with her pullinh the IV out and pulling leads off for the heart monitor.lm

## 2018-07-18 NOTE — Plan of Care (Signed)
  Problem: Education: Goal: Knowledge of General Education information will improve Description Including pain rating scale, medication(s)/side effects and non-pharmacologic comfort measures Outcome: Progressing   Problem: Health Behavior/Discharge Planning: Goal: Ability to manage health-related needs will improve Outcome: Progressing   Problem: Clinical Measurements: Goal: Ability to maintain clinical measurements within normal limits will improve Outcome: Progressing Goal: Will remain free from infection Outcome: Progressing Goal: Diagnostic test results will improve Outcome: Progressing Goal: Respiratory complications will improve Outcome: Progressing Goal: Cardiovascular complication will be avoided Outcome: Progressing   Problem: Activity: Goal: Risk for activity intolerance will decrease Outcome: Progressing   Problem: Nutrition: Goal: Adequate nutrition will be maintained Outcome: Progressing   Problem: Coping: Goal: Level of anxiety will decrease Outcome: Progressing   Problem: Elimination: Goal: Will not experience complications related to bowel motility Outcome: Progressing Goal: Will not experience complications related to urinary retention Outcome: Progressing   Problem: Pain Managment: Goal: General experience of comfort will improve Outcome: Progressing   Problem: Safety: Goal: Ability to remain free from injury will improve Outcome: Progressing   Problem: Skin Integrity: Goal: Risk for impaired skin integrity will decrease Outcome: Progressing   Problem: Education: Goal: Knowledge of disease or condition will improve Outcome: Progressing Goal: Knowledge of secondary prevention will improve Outcome: Progressing Goal: Knowledge of patient specific risk factors addressed and post discharge goals established will improve Outcome: Progressing   Problem: Coping: Goal: Will verbalize positive feelings about self Outcome: Progressing   Problem:  Health Behavior/Discharge Planning: Goal: Ability to manage health-related needs will improve Outcome: Progressing   Problem: Self-Care: Goal: Ability to participate in self-care as condition permits will improve Outcome: Progressing Goal: Verbalization of feelings and concerns over difficulty with self-care will improve Outcome: Progressing Goal: Ability to communicate needs accurately will improve Outcome: Progressing   Problem: Nutrition: Goal: Risk of aspiration will decrease Outcome: Progressing Goal: Dietary intake will improve Outcome: Progressing   Problem: Ischemic Stroke/TIA Tissue Perfusion: Goal: Complications of ischemic stroke/TIA will be minimized Outcome: Progressing

## 2018-07-18 NOTE — Progress Notes (Signed)
Occupational Therapy Treatment Patient Details Name: Laura [name] MRN: [id number] DOB: [birth year] Today's Date: 07/18/2018    History of present illness 77 y.o. female admitted with 2 week history of encephalopathy found to have acute CVA and bradycardia. PMH includes but not limited to: Autism, Deafness, prior CVA, tremor, hypothyroidism, HTN, HLD.    OT comments  Pt with increased weakness and cognitive deficits this session based on prior therapy notes and caregiver report. OT educating caregiver on visual deficits and positioning self in room to encourage visual scanning to L. Pt required increased cuing from interpreter and time to initiate tasks, sequence, and for safety awareness. RN notified of changes this session.   Follow Up Recommendations  CIR;Supervision/Assistance - 24 hour    Equipment Recommendations  Other (comment)    Recommendations for Other Services      Precautions / Restrictions Precautions Precautions: Fall Restrictions Weight Bearing Restrictions: No       Mobility Bed Mobility Overal bed mobility: Needs Assistance Bed Mobility: Supine to Sit;Sit to Supine     Supine to sit: Max assist Sit to supine: Max assist   General bed mobility comments: assist to manage trunk and B LEs  Transfers Overall transfer level: Needs assistance Equipment used: 1 person hand held assist Transfers: Sit to/from Stand                Balance Overall balance assessment: Needs assistance Sitting-balance support: No upper extremity supported;Feet supported Sitting balance-Leahy Scale: Poor     Standing balance support: Bilateral upper extremity supported;No upper extremity supported;During functional activity Standing balance-Leahy Scale: Poor Standing balance comment: Requires UE support.                            ADL either performed or assessed with clinical judgement   ADL             Toilet Transfer: Moderate assistance            Functional mobility during ADLs: Moderate assistance;Cueing for safety;Cueing for sequencing General ADL Comments: Pt ambulating initially with RW but seemed to need more cuing with AD. Pt required mod hand held assistance to ambulate to and from bathroom. Pt able to perform hygiene with min - mod A standing balance and assistance for thoroughness.     Vision Baseline Vision/History: Wears glasses Wears Glasses: At all times Vision Assessment?: Vision impaired- to be further tested in functional context Additional Comments: Pt with inattention to L side and occassional L gaze preference. Pt havving to do full head to to locate items on R side.           Cognition Arousal/Alertness: Awake/alert Behavior During Therapy: Flat affect Overall Cognitive Status: Impaired/Different from baseline Area of Impairment: Attention;Problem solving;Following commands;Safety/judgement;Awareness                   Current Attention Level: Sustained   Following Commands: Follows one step commands inconsistently Safety/Judgement: Decreased awareness of safety Awareness: Intellectual Problem Solving: Decreased initiation;Slow processing;Difficulty sequencing General Comments: Pt has autism at baseline but has demonstrated cognitive decline over the last few days. Pt requiring mod - max cuing from interpreter. Pt required increased time to complete all tasks                   Pertinent Vitals/ Pain       Pain Assessment: No/denies pain         Frequency  Min  3X/week        Progress Toward Goals  OT Goals(current goals can now be found in the care plan section)  Progress towards OT goals: Progressing toward goals  Acute Rehab OT Goals Patient Stated Goal: none stated by pt  Plan Discharge plan remains appropriate       AM-PAC PT "6 Clicks" Daily Activity     Outcome Measure   Help from another person eating meals?: A Little Help from another person taking care of  personal grooming?: A Little Help from another person toileting, which includes using toliet, bedpan, or urinal?: A Lot Help from another person bathing (including washing, rinsing, drying)?: A Lot Help from another person to put on and taking off regular upper body clothing?: A Little Help from another person to put on and taking off regular lower body clothing?: A Little 6 Click Score: 16    End of Session    OT Visit Diagnosis: Other abnormalities of gait and mobility (R26.89);Hemiplegia and hemiparesis Hemiplegia - Right/Left: Right Hemiplegia - caused by: Cerebral infarction   Activity Tolerance Patient tolerated treatment well   Patient Left with call bell/phone within reach   Nurse Communication Mobility status        Time: [id number] OT Time Calculation (min): 41 min  Charges: OT General Charges $OT Visit: 1 Visit OT Treatments $Self Care/Home Management : 38-52 mins    Betsie Peckman P, MS, OTR/L 07/18/2018, 12:06 PM

## 2018-07-18 NOTE — Progress Notes (Signed)
Dr. Roda ShuttersXu ([facility]) notified that patient is refusing to take any PO medications.

## 2018-07-19 ENCOUNTER — Encounter (HOSPITAL_COMMUNITY): Payer: Self-pay | Admitting: *Deleted

## 2018-07-19 ENCOUNTER — Inpatient Hospital Stay (HOSPITAL_COMMUNITY)
Admission: RE | Admit: 2018-07-19 | Discharge: 2018-07-26 | DRG: 057 | Disposition: A | Discharge status: Home Health | Payer: Medicare Other | Source: Intra-hospital | Attending: Physical Medicine & Rehabilitation | Admitting: Physical Medicine & Rehabilitation

## 2018-07-19 ENCOUNTER — Ambulatory Visit: Payer: Medicare Other | Admitting: Podiatry

## 2018-07-19 DIAGNOSIS — Z79899 Other long term (current) drug therapy: Secondary | ICD-10-CM | POA: Diagnosis not present

## 2018-07-19 DIAGNOSIS — Z7983 Long term (current) use of bisphosphonates: Secondary | ICD-10-CM | POA: Diagnosis not present

## 2018-07-19 DIAGNOSIS — I69319 Unspecified symptoms and signs involving cognitive functions following cerebral infarction: Secondary | ICD-10-CM

## 2018-07-19 DIAGNOSIS — R0981 Nasal congestion: Secondary | ICD-10-CM | POA: Diagnosis not present

## 2018-07-19 DIAGNOSIS — H913 Deaf nonspeaking, not elsewhere classified: Secondary | ICD-10-CM

## 2018-07-19 DIAGNOSIS — R32 Unspecified urinary incontinence: Secondary | ICD-10-CM | POA: Diagnosis present

## 2018-07-19 DIAGNOSIS — Z9841 Cataract extraction status, right eye: Secondary | ICD-10-CM | POA: Diagnosis not present

## 2018-07-19 DIAGNOSIS — E8809 Other disorders of plasma-protein metabolism, not elsewhere classified: Secondary | ICD-10-CM | POA: Diagnosis present

## 2018-07-19 DIAGNOSIS — Z9842 Cataract extraction status, left eye: Secondary | ICD-10-CM

## 2018-07-19 DIAGNOSIS — I63522 Cerebral infarction due to unspecified occlusion or stenosis of left anterior cerebral artery: Secondary | ICD-10-CM | POA: Diagnosis not present

## 2018-07-19 DIAGNOSIS — F70 Mild intellectual disabilities: Secondary | ICD-10-CM | POA: Diagnosis present

## 2018-07-19 DIAGNOSIS — I69351 Hemiplegia and hemiparesis following cerebral infarction affecting right dominant side: Secondary | ICD-10-CM | POA: Diagnosis not present

## 2018-07-19 DIAGNOSIS — R001 Bradycardia, unspecified: Secondary | ICD-10-CM | POA: Diagnosis present

## 2018-07-19 DIAGNOSIS — F411 Generalized anxiety disorder: Secondary | ICD-10-CM | POA: Diagnosis present

## 2018-07-19 DIAGNOSIS — I10 Essential (primary) hypertension: Secondary | ICD-10-CM | POA: Diagnosis present

## 2018-07-19 DIAGNOSIS — R739 Hyperglycemia, unspecified: Secondary | ICD-10-CM | POA: Diagnosis present

## 2018-07-19 DIAGNOSIS — R29703 NIHSS score 3: Secondary | ICD-10-CM | POA: Diagnosis present

## 2018-07-19 DIAGNOSIS — I69311 Memory deficit following cerebral infarction: Secondary | ICD-10-CM

## 2018-07-19 DIAGNOSIS — Z79890 Hormone replacement therapy (postmenopausal): Secondary | ICD-10-CM

## 2018-07-19 DIAGNOSIS — Z9181 History of falling: Secondary | ICD-10-CM

## 2018-07-19 DIAGNOSIS — F840 Autistic disorder: Secondary | ICD-10-CM | POA: Diagnosis present

## 2018-07-19 DIAGNOSIS — G8191 Hemiplegia, unspecified affecting right dominant side: Secondary | ICD-10-CM | POA: Diagnosis not present

## 2018-07-19 DIAGNOSIS — G40909 Epilepsy, unspecified, not intractable, without status epilepticus: Secondary | ICD-10-CM | POA: Diagnosis present

## 2018-07-19 DIAGNOSIS — G2581 Restless legs syndrome: Secondary | ICD-10-CM | POA: Diagnosis present

## 2018-07-19 DIAGNOSIS — M1990 Unspecified osteoarthritis, unspecified site: Secondary | ICD-10-CM | POA: Diagnosis present

## 2018-07-19 DIAGNOSIS — E039 Hypothyroidism, unspecified: Secondary | ICD-10-CM | POA: Diagnosis present

## 2018-07-19 LAB — BASIC METABOLIC PANEL
Anion gap: 7 (ref 5–15)
BUN: 15 mg/dL (ref 8–23)
CALCIUM: 8.1 mg/dL — AB (ref 8.9–10.3)
CO2: 27 mmol/L (ref 22–32)
Chloride: 105 mmol/L (ref 98–111)
Creatinine, Ser: 0.74 mg/dL (ref 0.44–1.00)
GFR calc Af Amer: >60 mL/min (ref >60)
GLUCOSE: 139 mg/dL — AB (ref 70–99)
Potassium: 4 mmol/L (ref 3.5–5.1)
Sodium: 139 mmol/L (ref 135–145)

## 2018-07-19 LAB — MAGNESIUM: Magnesium: 1.7 mg/dL (ref 1.7–2.4)

## 2018-07-19 LAB — CBC
HCT: 42.4 % (ref 36.0–46.0)
Hemoglobin: 13.7 g/dL (ref 12.0–15.0)
MCH: 30.3 pg (ref 26.0–34.0)
MCHC: 32.3 g/dL (ref 30.0–36.0)
MCV: 93.8 fL (ref 78.0–100.0)
PLATELETS: 233 10*3/uL (ref 150–400)
RBC: 4.52 MIL/uL (ref 3.87–5.11)
RDW: 13 % (ref 11.5–15.5)
WBC: 10.3 10*3/uL (ref 4.0–10.5)

## 2018-07-19 MED ORDER — CLONAZEPAM 0.5 MG PO TABS: 1.0000 mg | ORAL_TABLET | Freq: Three times a day (TID) | ORAL | Status: DC | PRN

## 2018-07-19 MED ORDER — TRAZODONE HCL 50 MG PO TABS: 25.0000 mg | ORAL_TABLET | Freq: Every evening | ORAL | Status: DC | PRN

## 2018-07-19 MED ORDER — PROCHLORPERAZINE 25 MG RE SUPP: 12.5000 mg | Freq: Four times a day (QID) | RECTAL | Status: DC | PRN

## 2018-07-19 MED ORDER — ASPIRIN 325 MG PO TBEC: 325.0000 mg | DELAYED_RELEASE_TABLET | Freq: Every day | ORAL | 0 refills | Status: DC

## 2018-07-19 MED ORDER — POLYETHYLENE GLYCOL 3350 17 G PO PACK: 17.0000 g | PACK | Freq: Every day | ORAL | Status: DC | PRN

## 2018-07-19 MED ORDER — GUAIFENESIN-DM 100-10 MG/5ML PO SYRP: 5.0000 mL | ORAL_SOLUTION | Freq: Four times a day (QID) | ORAL | Status: DC | PRN

## 2018-07-19 MED ORDER — DIPHENHYDRAMINE HCL 12.5 MG/5ML PO ELIX: 12.5000 mg | ORAL_SOLUTION | Freq: Four times a day (QID) | ORAL | Status: DC | PRN

## 2018-07-19 MED ORDER — ACETAMINOPHEN 325 MG PO TABS: 325.0000 mg | ORAL_TABLET | ORAL | Status: DC | PRN

## 2018-07-19 MED ORDER — BISACODYL 10 MG RE SUPP: 10.0000 mg | Freq: Every day | RECTAL | Status: DC | PRN

## 2018-07-19 MED ORDER — FLEET ENEMA 7-19 GM/118ML RE ENEM: 1.0000 | ENEMA | Freq: Once | RECTAL | Status: DC | PRN

## 2018-07-19 MED ORDER — TRIAMCINOLONE ACETONIDE 0.1 % EX CREA
TOPICAL_CREAM | Freq: Three times a day (TID) | CUTANEOUS | Status: DC | PRN
  Filled 2018-07-19: qty 15

## 2018-07-19 MED ORDER — ATORVASTATIN CALCIUM 80 MG PO TABS
80.0000 mg | ORAL_TABLET | Freq: Every day | ORAL | Status: DC
  Administered 2018-07-19 – 2018-07-25 (×7): 80 mg via ORAL
  Filled 2018-07-19 (×7): qty 1

## 2018-07-19 MED ORDER — ASPIRIN EC 325 MG PO TBEC
325.0000 mg | DELAYED_RELEASE_TABLET | Freq: Every day | ORAL | Status: DC
  Administered 2018-07-20 – 2018-07-26 (×7): 325 mg via ORAL
  Filled 2018-07-19 (×7): qty 1

## 2018-07-19 MED ORDER — CLOPIDOGREL BISULFATE 75 MG PO TABS: 75.0000 mg | ORAL_TABLET | Freq: Every day | ORAL | 0 refills | Status: DC

## 2018-07-19 MED ORDER — LEVOTHYROXINE SODIUM 50 MCG PO TABS
50.0000 ug | ORAL_TABLET | Freq: Every day | ORAL | Status: DC
  Administered 2018-07-20 – 2018-07-26 (×7): 50 ug via ORAL
  Filled 2018-07-19 (×7): qty 1

## 2018-07-19 MED ORDER — ALUM & MAG HYDROXIDE-SIMETH 200-200-20 MG/5ML PO SUSP: 30.0000 mL | ORAL | Status: DC | PRN

## 2018-07-19 MED ORDER — ENOXAPARIN SODIUM 30 MG/0.3ML ~~LOC~~ SOLN
30.0000 mg | SUBCUTANEOUS | Status: DC
  Administered 2018-07-19: 30 mg via SUBCUTANEOUS
  Filled 2018-07-19: qty 0.3

## 2018-07-19 MED ORDER — CLONAZEPAM 0.25 MG PO TBDP
0.2500 mg | ORAL_TABLET | Freq: Every day | ORAL | Status: DC
  Administered 2018-07-19 – 2018-07-25 (×7): 0.25 mg via ORAL
  Filled 2018-07-19 (×7): qty 1

## 2018-07-19 MED ORDER — CLOPIDOGREL BISULFATE 75 MG PO TABS
75.0000 mg | ORAL_TABLET | Freq: Every day | ORAL | Status: DC
  Administered 2018-07-20 – 2018-07-26 (×7): 75 mg via ORAL
  Filled 2018-07-19 (×7): qty 1

## 2018-07-19 MED ORDER — PROCHLORPERAZINE EDISYLATE 10 MG/2ML IJ SOLN: 5.0000 mg | Freq: Four times a day (QID) | INTRAMUSCULAR | Status: DC | PRN

## 2018-07-19 MED ORDER — LEVETIRACETAM IN NACL 500 MG/100ML IV SOLN
500.0000 mg | Freq: Two times a day (BID) | INTRAVENOUS | Status: DC
  Administered 2018-07-19: 500 mg via INTRAVENOUS
  Filled 2018-07-19 (×2): qty 100

## 2018-07-19 MED ORDER — PROCHLORPERAZINE MALEATE 5 MG PO TABS: 5.0000 mg | ORAL_TABLET | Freq: Four times a day (QID) | ORAL | Status: DC | PRN

## 2018-07-19 MED ORDER — ATORVASTATIN CALCIUM 80 MG PO TABS: 80.0000 mg | ORAL_TABLET | Freq: Every day | ORAL | 0 refills | Status: DC

## 2018-07-19 NOTE — Progress Notes (Signed)
Inpatient Rehabilitation-Admissions Coordinator    Met with patient and her POA (as well as sign language interpreter) at the bedside to discuss team's recommendation for inpatient rehabilitation. Shared booklets, expectations while in CIR, expected length of stay, and anticipated functional level at DC. Pt indicated willingness to go to rehab; POA wants to pursue CIR at this time with plan to then go to [facility] at DC for needed supervision/assist.   [facility] addressed pt's recent confusion with Attending Dr. Erlinda Hong this morning who feels that her confusion may be due to mild extension of the acute infarct; no change in POC at this time.   AC has received medical approval from Dr. Erlinda Hong. [facility] plans to admit pt to CIR today. Will relay plan to SW/CM on the floor. Please call if questions.   Jhonnie Garner, OTR/L  Rehab Admissions Coordinator  [phone] 07/19/2018 11:21 AM

## 2018-07-19 NOTE — Consult Note (Signed)
            [facility]  07/19/2018  Laura [name] [birth year] [id number]   Attemptto see patient at the bedside to identify possible discharge needs but [facility]. [facility] with plan to go to assisted living facility at discharge.   Per MD note, patient was admitted with altered mental status, gait difficulty, lightheadedness, dizziness (acute encephalopathy, acute distal left ACA infarcts per MRI).  Patient has discharge instruction to follow-up with primary care provider within a week for a post hospital visit, cardiology follow-up in 4 weeks and neurology in 6 weeks.    For additional questions please contact:  Karin GoldenLorraine A. Signora Zucco, BSN, RN-BC [facility] PRIMARY CARE Navigator Cell: [phone]

## 2018-07-19 NOTE — Progress Notes (Signed)
Physical Medicine and Rehabilitation Consult Reason for Consult: left ACA infarcts Referring Physician: Albertine Grates, MD   HPI: Laura [name] is a 77 y.o. female with past medical history of tremors, autism spectrum disorder, mild mental retardation, hypothyroidism, hypertension, deafness, ICH in 03/2017, arthritis presented on 07/14/18 with left ACA stroke. History taken from chart review. Patient lives in an independent living facility, where she had increased difficulty with gait, confusion, particularly over the last 3 days. She was brought to the ED. She was noted to be bradycardic. MRI an EEG ordered. Neurology consulted. There was some concern for seizures. CT head ordered, reviewed, unremarkable for acute intracranial process. 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 Cardiology was consult and due to bradycardia recommended outpatient monitoring   Review of Systems  Unable to perform ROS: Patient unresponsive       Past Medical History:  Diagnosis Date  . Anxiety state, unspecified 10/10/2007  . Arthritis   . ASYMPTOMATIC POSTMENOPAUSAL STATUS 04/15/2009  . Cataract    removed both eyes   . Deaf   . Dermatitis   . Headache(784.0) 08/27/2008  . HYPERLIPIDEMIA 06/22/2007   rx refused  . HYPERTENSION 05/07/2008  . HYPOTHYROIDISM 04/15/2009  . Mild mental retardation   . 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 RESTLESS LEG SYNDROME 10/01/2008  . TACHYCARDIA 08/27/2008  . TREMOR 11/21/2007  . TRIGGER FINGER 10/30/2009        Past Surgical History:  Procedure Laterality Date  . CATARACT EXTRACTION, BILATERAL    . COLONOSCOPY          Family History  Problem Relation Age of Onset  . Thyroid disease Neg Hx   . Colon cancer Neg Hx   . Colon polyps Neg Hx   . Rectal cancer Neg Hx   . Stomach cancer Neg Hx    Social History:  reports that she has never smoked. She has never  used smokeless tobacco. She reports that she does not drink alcohol or use drugs. Allergies: No Known Allergies       Medications Prior to Admission  Medication Sig Dispense Refill  . alendronate (FOSAMAX) 70 MG tablet TAKE 1 TABLET BY MOUTH ONCE A WEEK .  TAKE  WITH  A  FULL  GLASS  OF  WATER  ON  AN  EMPTY  STOMACH (Patient taking differently: Take 70 mg by mouth once a week. On Saturday) 4 tablet 11  . amLODipine (NORVASC) 10 MG tablet Take 1 tablet (10 mg total) by mouth daily.    . cholecalciferol (VITAMIN D) 1000 UNITS tablet Take 1,000 Units by mouth daily.    . clonazePAM (KLONOPIN) 1 MG tablet Take 1 tablet (1 mg total) by mouth 3 (three) times daily as needed (tremor). 90 tablet 2  . levETIRAcetam (KEPPRA) 500 MG tablet Take 1 tablet (500 mg total) by mouth 2 (two) times daily.    Marland Kitchen levothyroxine (SYNTHROID, LEVOTHROID) 50 MCG tablet Take 1 tablet (50 mcg total) by mouth daily. 90 tablet 1  . Multiple Vitamin (MULTIVITAMIN) tablet Take 1 tablet by mouth daily.      Marland Kitchen triamcinolone cream (KENALOG) 0.1 % APPLY  CREAM EXTERNALLY THREE TIMES DAILY AS NEEDED FOR ITCHING (Patient not taking: Reported on 07/14/2018) 80 g 2    Home: Home Living Family/patient expects to be discharged to:: Inpatient rehab Additional Comments: Previously living in infependent living at [facility].  Functional History: Prior Function  Level of Independence: Independent with assistive device(s) Comments: Pt living in independent living facility. Over the past few weeks she has demonstrated cognitive decline per POA. She has a history of falling and staff will find her down in room at times when they notice her absent at breakfast.  Functional Status:  Mobility: Bed Mobility Overal bed mobility: Needs Assistance Bed Mobility: Supine to Sit, Sit to Supine Supine to sit: Min assist Sit to supine: Min assist General bed mobility comments: Assist to manage RLE into and out of bed.   Transfers Overall transfer level: Needs assistance Equipment used: Rolling walker (2 wheeled) Transfers: Sit to/from Stand Sit to Stand: Min assist, Mod assist General transfer comment: Min assist to power up from bed and mod assist to power up from low commode.  Ambulation/Gait Ambulation/Gait assistance: Min guard Gait Distance (Feet): 20 Feet Assistive device: Rolling walker (2 wheeled) Gait Pattern/deviations: Step-to pattern General Gait Details: pt ambulating with poor awarness and obstacale avoidance with RW. min guard to min A for stability at times but able to walk to bathroom this visit and back to bed.  Gait velocity: decreased  ADL: ADL Overall ADL's : Needs assistance/impaired Eating/Feeding: Set up, Sitting Grooming: Minimal assistance, Sitting Upper Body Bathing: Supervision/ safety, Sitting Lower Body Bathing: Minimal assistance, Sit to/from stand Upper Body Dressing : Supervision/safety, Sitting Lower Body Dressing: Minimal assistance, Sit to/from stand Toilet Transfer: Minimal assistance, Ambulation, RW, Moderate assistance Toilet Transfer Details (indicate cue type and reason): Min assist from bed and mod assist from toilet.  Toileting- Clothing Manipulation and Hygiene: Supervision/safety, Sitting/lateral lean Functional mobility during ADLs: Minimal assistance, Moderate assistance, Rolling walker General ADL Comments: Pt able to complete automatic toileting tasks. Required cues to wash hands. Self-directed tasks throughout.   Cognition: Cognition Overall Cognitive Status: Difficult to assess Orientation Level: Appropriate for developmental age, Oriented X4(use of interpreter for assessment ) Cognition Arousal/Alertness: Awake/alert Behavior During Therapy: Impulsive Overall Cognitive Status: Difficult to assess Area of Impairment: Attention, Problem solving, Following commands Current Attention Level: Sustained Following Commands: Follows one step  commands inconsistently Problem Solving: Decreased initiation, Slow processing, Difficulty sequencing General Comments: Pt has autism at baseline but has demonstrated cognitive decline over the past week. Caregiver reports that she has been attempting to drink from water bottle with lid on. Pt impulsive and perseverative. Decreased problem solving noted during toileting tasks.  Difficult to assess due to: Hard of hearing/deaf  Blood pressure (!) 148/90, pulse (!) 102, temperature 98.2 F (36.8 C), temperature source Oral, resp. rate 18, height 5\' 3"  (1.6 m), weight 57.3 kg, SpO2 94 %. Physical Exam  Constitutional: She appears well-developed and well-nourished.  HENT:  Head: Normocephalic and atraumatic.  Eyes: Right eye exhibits no discharge. Left eye exhibits no discharge. No scleral icterus.  Neck: Normal range of motion. Neck supple.  Cardiovascular: Normal rate and regular rhythm.  Respiratory: Effort normal and breath sounds normal.  GI: Soft. Bowel sounds are normal.  Musculoskeletal:  No edema or tenderness in extremities  Neurological: She is alert.  Motor: Limited due to cognition and ability to understand commands, but appears to be greater than or equal to 3/5 throughout  Skin: Skin is warm and dry.  Psychiatric: Her affect is blunt. She is slowed. She is noncommunicative.    LabResultsLast24Hours       Results for orders placed or performed during the hospital encounter of 07/14/18 (from the past 24 hour(s))  Glucose, capillary     Status: Abnormal  Collection Time: 07/16/18  9:20 PM  Result Value Ref Range   Glucose-Capillary 186 (H) 70 - 99 mg/dL  Glucose, capillary     Status: Abnormal   Collection Time: 07/17/18 12:17 PM  Result Value Ref Range   Glucose-Capillary 132 (H) 70 - 99 mg/dL  Glucose, capillary     Status: Abnormal   Collection Time: 07/17/18  4:34 PM  Result Value Ref Range   Glucose-Capillary 192 (H) 70 - 99 mg/dL       [id number])  Ct Angio Head W Or Wo Contrast  Result Date: 07/16/2018 CLINICAL DATA:  Acute infarctions in the left anterior cerebral artery territory demonstrated by MRI yesterday. EXAM: CT ANGIOGRAPHY HEAD AND NECK TECHNIQUE: Multidetector CT imaging of the head and neck was performed using the standard protocol during bolus administration of intravenous contrast. Multiplanar CT image reconstructions and MIPs were obtained to evaluate the vascular anatomy. Carotid stenosis measurements (when applicable) are obtained utilizing NASCET criteria, using the distal internal carotid diameter as the denominator. CONTRAST:  50mL ISOVUE-370 IOPAMIDOL (ISOVUE-370) INJECTION 76% COMPARISON:  MR and CT examinations done 07/15/2018 and 07/14/2018. FINDINGS: CTA NECK FINDINGS Aortic arch: Ectasia of the aortic after check without dissection. Right carotid system: Common carotid artery is patent to the bifurcation region which is low within the neck. There is atherosclerotic plaque at the carotid bifurcation but there is no stenosis or irregularity. Cervical ICA is widely patent. Left carotid system: Common carotid artery is widely patent to the bifurcation region. There is atherosclerotic plaque at the carotid bifurcation but no stenosis or irregularity. Cervical ICA is widely patent. Vertebral arteries: There is some atherosclerotic plaque of both proximal subclavian arteries but no flow limiting stenosis. Right vertebral artery is widely patent. Left vertebral artery shows a small calcified plaque with 30% stenosis at the origin. Beyond that, both vertebral arteries are widely patent through the cervical region to the foramen magnum. 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 Upper chest: Normal Review of the MIP images confirms the above findings CTA HEAD FINDINGS Anterior circulation:  Both internal carotid arteries are patent through the skull base and siphon regions without stenosis. The anterior and middle cerebral vessels are patent without proximal stenosis, aneurysm or vascular malformation. The left anterior cerebral artery is occluded in the A2 segment. There is thready reconstitution beyond that initial occlusion. Posterior circulation: Both vertebral arteries are patent to the basilar. No basilar stenosis. Posterior circulation branch vessels are normal. 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 Venous sinuses: Patent and normal. Anatomic variants: None other significant. 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 Delayed phase: No abnormal enhancement. 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 Minimal atherosclerotic disease at both carotid bifurcations but without stenosis or significant irregularity. Occlusion of the left anterior cerebral artery in the A2 segment with thready reconstitution beyond that. Atherosclerotic disease of both subclavian arteries proximal to the vertebral artery origins, but without flow limiting stenosis. 30% stenosis of the left vertebral artery origin. 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 Electronically Signed   By: Paulina Fusi M.D.   On: 07/16/2018 16:16   Ct Angio Neck W Or Wo Contrast  Result Date: 07/16/2018 CLINICAL DATA:  Acute infarctions in the left  anterior cerebral artery territory demonstrated by MRI yesterday. EXAM: CT ANGIOGRAPHY HEAD AND NECK TECHNIQUE: Multidetector CT imaging of the head and neck was performed using the standard protocol during bolus administration of intravenous contrast. Multiplanar CT image reconstructions and MIPs were obtained to evaluate the vascular anatomy. Carotid stenosis  measurements (when applicable) are obtained utilizing NASCET criteria, using the distal internal carotid diameter as the denominator. CONTRAST:  50mL ISOVUE-370 IOPAMIDOL (ISOVUE-370) INJECTION 76% COMPARISON:  MR and CT examinations done 07/15/2018 and 07/14/2018. FINDINGS: CTA NECK FINDINGS Aortic arch: Ectasia of the aortic after check without dissection. Right carotid system: Common carotid artery is patent to the bifurcation region which is low within the neck. There is atherosclerotic plaque at the carotid bifurcation but there is no stenosis or irregularity. Cervical ICA is widely patent. Left carotid system: Common carotid artery is widely patent to the bifurcation region. There is atherosclerotic plaque at the carotid bifurcation but no stenosis or irregularity. Cervical ICA is widely patent. Vertebral arteries: There is some atherosclerotic plaque of both proximal subclavian arteries but no flow limiting stenosis. Right vertebral artery is widely patent. Left vertebral artery shows a small calcified plaque with 30% stenosis at the origin. Beyond that, both vertebral arteries are widely patent through the cervical region to the foramen magnum. 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 Upper chest: Normal Review of the MIP images confirms the above findings CTA HEAD FINDINGS Anterior circulation: Both internal carotid arteries are patent through the skull base and siphon regions without stenosis. The anterior and middle cerebral vessels are patent without proximal stenosis, aneurysm or vascular malformation. The left anterior cerebral artery is occluded in the A2 segment. There is thready reconstitution beyond that initial occlusion. Posterior circulation: Both vertebral arteries are patent to the basilar. No basilar stenosis. Posterior circulation branch vessels are normal. 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 Venous sinuses: Patent and normal. Anatomic variants: None other significant. 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 Delayed phase: No abnormal enhancement. 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 Minimal atherosclerotic disease at both carotid bifurcations but without stenosis or significant irregularity. Occlusion of the left anterior cerebral artery in the A2 segment with thready reconstitution beyond that. Atherosclerotic disease of both subclavian arteries proximal to the vertebral artery origins, but without flow limiting stenosis. 30% stenosis of the left vertebral artery origin. 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 Electronically Signed   By: Paulina FusiMark  Shogry M.D.   On: 07/16/2018 16:16     Assessment/Plan: Diagnosis: left ACA infarcts Labs and images independently reviewed.  Records reviewed and summated above. Stroke: Continue secondary stroke prophylaxis and Risk Factor Modification listed below:   Antiplatelet therapy:   Blood Pressure Management:  Continue current medication with prn's with permisive HTN per primary team Statin Agent:   Diabetes management:   ?hemiparesis:  Motor recovery: Fluoxetine  1. Does the need for close, 24 hr/day medical supervision in concert with the patient's rehab needs make it unreasonable for this patient to be served in a less intensive setting? Potentially 2. Co-Morbidities requiring supervision/potential complications: tremors, autism spectrum disorder, mild mental retardation, hypothyroidism (TSH elevated,cont meds, ensure appropriate mood and energy level for therapies), HTN (monitor and provide prns in  accordance with increased physical exertion and pain), deafness, ICH in 03/2017, arthritis, DM  (Monitor in accordance with exercise and adjust meds as necessary), Tachycardia (monitor in accordance with pain and increasing activity) 3. Due to safety, disease management, medication administration and patient education, does the patient require 24 hr/day rehab nursing? Yes 4. Does the patient require coordinated care of a physician, rehab nurse, PT (1-2 hrs/day, 5 days/week), OT (1-2 hrs/day, 5 days/week) and SLP (1-2 hrs/day, 5 days/week) to address physical and functional deficits in the context of the above medical diagnosis(es)? Yes Addressing deficits in the following areas: balance, endurance, locomotion, strength, transferring, bathing, dressing, toileting, cognition, language and psychosocial support 5. Can the patient actively participate in an intensive therapy program of at least 3 hrs of therapy per day at least 5 days per week? Yes 6. The potential for patient to make measurable gains while on inpatient rehab is good 7. Anticipated functional outcomes upon discharge from inpatient rehab are supervision  with PT, supervision with OT, supervision with SLP. 8. Estimated rehab length of stay to reach the above functional goals is: 5-7 days. 9. Anticipated D/C setting: Home 10. Anticipated post D/C treatments: HH therapy and Home excercise program 11. Overall Rehab/Functional Prognosis: good  RECOMMENDATIONS: This patient's condition is appropriate for continued rehabilitative care in the following setting: Unclear baseline level of functioning or caregiver support at discharge. However, patient appears to be doing relatively well on day of evaluation. Will need to investigate further. We'll consider CIR if patient not at or near baseline level of function.. Patient has agreed to participate in recommended program. Potentially Note that insurance prior authorization may be required for reimbursement for recommended care.  Comment: Rehab Admissions Coordinator to follow up.  Maryla Morrow, MD, ABPMR 07/17/2018         Routing History

## 2018-07-19 NOTE — Progress Notes (Signed)
CSW alerted by Rehab Admissions Coordinator that patient will be admitting to CIR today. No further CSW needs at this time.  CSW signing off.  Blenda NicelyElizabeth Ethen Bannan, Kentucky[place]CS[place] Clinical Social Worker [phone]

## 2018-07-19 NOTE — Progress Notes (Signed)
Patient admitted via bed with POA at bedside at 1448 alert and oriented to self. 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 Patient smiling at times and allowed RN to assess her and complete her physical assessment. Oriented patient and interpretor/ POA to room and call bell system. POA verbalized understanding of admission process. Continue with plan of care.  Laura [name], Laura [name]

## 2018-07-19 NOTE — PMR Pre-admission (Signed)
PMR Admission Coordinator Pre-Admission Assessment  Patient: Laura [name] is an 77 y.o., female MRN: [id number] DOB: 1940-12-04 Height: 5' 3" (160 cm) Weight: 57.3 kg              Insurance Information HMO:     PPO:      PCP:      IPA:      80/20: Yes     OTHER:  PRIMARY: Medicare Part A and B      Policy#: [id number]      Subscriber: Patient CM Name:       Phone#:      Fax#:  Pre-Cert#:       Employer:  Benefits:  Phone #: NA     Name: Verified via [place] on 07/19/18 Eff. Date: Part A effective: 05/23/1972; Part B effective: 05/23/1972     Deduct: $1,364      Out of Pocket Max: NA      Life Max: NA CIR: Covered per Medicare Guidelines once yearly deductible is met      SNF: days 1-20, 100%; days 21-100, 80% Outpatient: 80%     Co-Pay: 20% Home Health: 100%      Co-Pay:  DME: 80%     Co-Pay: 20% Providers: Pt's Choice  SECONDARY:  [employer]       Policy#: [id number]      Subscriber: Patient CM Name:       Phone#:      Fax#:  Pre-Cert#:       Employer:  Benefits:  Phone #: [phone]     Name:  Eff. 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  POA reported 2-week history of cognitive and language impairments with waxing and waning of symptoms and confusion as well as  occasional staring off activity.  CT head was negative for acute stroke.  MRI of brain done revealing cluster of small acute infarcts in the distal left PCA territory, ventriculomegaly from left greater than right  volume loss remote left temporal lobe hemorrhage and left lateral lenticulostriate infarct.  Neurology consulted for input and recommended continuing Keppra twice daily question of compliance prior to admission.  2D echo showed EF of 55 to 60% with no wall abnormality.  Bilateral lower extreme Dopplers were negative for DVT  Dr. Lovena Le consulted for input on bradycardia and did not feel the need for PPM as patient without clinical symptoms and mildly prolonged QRS noted on EKG.  He recommended avoiding AV nodal blocking agents and follow-up for 2-week monitor on outpatient basis.  She did develop right-sided weakness with brief loss of consciousness on 8:26 PM after receiving IV hydralazine for elevated blood pressure--patient has been noncompliant with meds during admission.  Repeat MRI of brain showed increase in number of acute distal left ACA infarcts. Dr Erlinda Hong felt that external stroke was due to hypotension and recommended compliance with medications Complete NIHSS TOTAL: 3    Past Medical History  Past Medical History:  Diagnosis Date  . Anxiety state, unspecified 10/10/2007  . Arthritis   . ASYMPTOMATIC POSTMENOPAUSAL STATUS 04/15/2009  . Cataract    removed both eyes   . Deaf   . Dermatitis   . Headache(784.0) 08/27/2008  . HYPERLIPIDEMIA 06/22/2007   rx refused  . HYPERTENSION 05/07/2008  . HYPOTHYROIDISM 04/15/2009  . Mild mental retardation   . 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 RESTLESS LEG SYNDROME 10/01/2008  . TACHYCARDIA 08/27/2008  . TREMOR 11/21/2007  . TRIGGER FINGER 10/30/2009    Family History  family history is not on file.  Prior Rehab/Hospitalizations:  Has the patient had major surgery during 100 days prior to admission? No  Current Medications    Current Facility-Administered Medications:  .  0.9 %  sodium chloride infusion, , Intravenous, Continuous, Florencia Reasons, MD, Last Rate: 75 mL/hr at 07/19/18 0641 .  acetaminophen (TYLENOL) tablet 650 mg, 650 mg, Oral, Q6H PRN, 650 mg at 07/15/18 1622 **OR** acetaminophen (TYLENOL) suppository 650 mg, 650 mg, Rectal, Q6H PRN, Etta Quill, DO .  aspirin EC tablet 325 mg, 325 mg, Oral, Daily, Rosalin Hawking, MD, 325 mg at 07/19/18 0844 .  atorvastatin (LIPITOR) tablet 80 mg, 80 mg, Oral, q1800, Lilyan Gilford I, NP, 80 mg at 07/17/18 1726 .  clonazePAM (KLONOPIN) disintegrating tablet 0.25 mg, 0.25 mg, Oral, QHS, Florencia Reasons, MD, 0.25 mg at 07/17/18 2020 .  clonazePAM (KLONOPIN) tablet 1 mg, 1 mg, Oral, TID PRN, Etta Quill, DO, 1 mg at 07/15/18 1622 .  clopidogrel (PLAVIX) tablet 75 mg, 75 mg, Oral, Daily, Rosalin Hawking, MD, 75 mg at 07/19/18 0843 .  levETIRAcetam (KEPPRA) IVPB 500 mg/100 mL premix, 500 mg, Intravenous, Q12H, Florencia Reasons, MD, Last Rate: 400 mL/hr at 07/19/18 0850, 500 mg at 07/19/18 0850 .  levothyroxine (SYNTHROID, LEVOTHROID) tablet 50 mcg, 50 mcg, Oral, QAC breakfast, Etta Quill, DO, 50 mcg at 07/19/18 0807 .  ondansetron (ZOFRAN) tablet 4 mg, 4 mg, Oral, Q6H PRN **OR** ondansetron (ZOFRAN) injection 4 mg, 4 mg, Intravenous, Q6H PRN, Alcario Drought, Jared M, DO .  zolpidem (AMBIEN) tablet 5 mg, 5 mg, Oral, QHS PRN, Florencia Reasons, MD, 5 mg at 07/17/18 2020  Patients Current Diet:  Diet Order            Diet Heart Room service appropriate? Yes; Fluid consistency: Thin  Diet effective now              Precautions / Restrictions Precautions Precautions: Fall Restrictions Weight Bearing Restrictions: No   Has the patient had 2 or more falls or a fall with injury in the past year?Yes  Prior Activity Level Community (5-7x/wk): would walk halls of her ILF (pt ruminates and likes to walk extnesively; pt is very habitual and follows schedules  Development worker, international aid / Equipment     Prior Device Use: Indicate devices/aids used by the patient prior to current illness, exacerbation or injury? Walker  Prior Functional Level Prior Function Level of Independence: Independent with assistive device(s) Comments: Pt living in independent living facility. Over the past few  weeks she has demonstrated cognitive decline per POA. She has a history of falling and staff will find her down in room at times when they notice her absent at breakfast.   Self Care: Did the patient need help bathing, dressing, using the toilet or eating?  Independent (set up for meals by ILF)  Indoor Mobility: Did the patient need assistance with walking from room to room (with or without device)? Independent  Stairs: Did the patient need assistance with internal or external stairs (with or without device)? Needed some help; used elevator at [facility]  Functional Cognition: Did the patient need help planning regular tasks such as shopping or remembering to take medications? Needed some help  Current Functional Level Cognition  Overall Cognitive Status: Impaired/Different from baseline Difficult to assess due to: Hard of hearing/deaf Current Attention Level: Sustained Orientation Level: Oriented to person, Oriented to place Following Commands: Follows one step commands inconsistently, Follows one step commands with increased time Safety/Judgement: Decreased awareness of safety General Comments: pt is very easily distracted by environment and preoccupied with telemetry box; Pt has autism at baseline but has demonstrated cognitive decline beginning yesterday 8/25 per family present. Pt requiring mod - max cuing from interpreter. Pt required increased time to complete all tasks    Extremity Assessment (includes Sensation/Coordination)  Upper Extremity Assessment: RUE deficits/detail RUE Deficits / Details: Appears weak compared with L. Signs with L hand today. Slow movement noted.   Lower Extremity  Assessment: RLE deficits/detail RLE Deficits / Details: Difficulty raising RLE into bed.  RLE Coordination: decreased fine motor, decreased gross motor    ADLs  Overall ADL's : Needs assistance/impaired Eating/Feeding: Set up, Sitting Grooming: Minimal assistance, Sitting Upper Body Bathing: Supervision/ safety, Sitting Lower Body Bathing: Minimal assistance, Sit to/from stand Upper Body Dressing : Supervision/safety, Sitting Lower Body Dressing: Minimal assistance, Sit to/from stand Toilet Transfer: Moderate assistance Toilet Transfer Details (indicate cue type and reason): Min assist from bed and mod assist from toilet.  Toileting- Clothing Manipulation and Hygiene: Supervision/safety, Sitting/lateral lean Functional mobility during ADLs: Moderate assistance, Cueing for safety, Cueing for sequencing General ADL Comments: Pt ambulating initially with RW but seemed to need more cuing with AD. Pt required mod hand held assistance to ambulate to and from bathroom. Pt able to perform hygiene with min - mod A standing balance and assistance for thoroughness.    Mobility  Overal bed mobility: Needs Assistance Bed Mobility: Sit to Supine Supine to sit: Max assist Sit to supine: Min assist General bed mobility comments: pt sitting EOB with NT present upon arrival; pt requires assistance to bring bilat LE into bed and max multimodal cues for initiating task    Transfers  Overall transfer level: Needs assistance Equipment used: Rolling walker (2 wheeled), 1 person hand held assist Transfers: Sit to/from Stand Sit to Stand: Min assist, Mod assist General transfer comment: min A from EOB and mod A from commode with use of grab bar    Ambulation / Gait / Stairs / Wheelchair Mobility  Ambulation/Gait Ambulation/Gait assistance: Min assist, +2 physical assistance, Mod assist Gait Distance (Feet): (75) Assistive device: Rolling walker (2 wheeled), 1 person hand held assist, 2 person hand held  assist Gait Pattern/deviations: Step-to pattern, Decreased step length - right, Decreased stride length, Decreased dorsiflexion - right General Gait Details: initially ambulating with RW however RW seems to distract pt more than assist with balance; pt able to ambulate with mod A +1 and/or min A +2 HHA; max cues for   navigating environment and for increased R LE step lengths; pt at times leaving R LE behind  Gait velocity: decreased    Posture / Balance Balance Overall balance assessment: Needs assistance Sitting-balance support: Feet supported Sitting balance-Leahy Scale: Fair Standing balance support: Bilateral upper extremity supported, During functional activity Standing balance-Leahy Scale: Poor Standing balance comment: Requires UE support.     Special needs/care consideration BiPAP/CPAP: No CPM: No Continuous Drip IV: receiving Keppra IV and .9% Sodium Chloride infusion  Dialysis:No        Days: No Life Vest: no Oxygen: no Special Bed: no Trach Size: no Wound Vac (area): no      Location: no Skin:  Generalized scabs on LLE and Arm; pt likes to pick at skin according to POA;                  Bowel mgmt:Last BM: 07/18/18; recent incontinence  Bladder mgmt: recent incontinence since hospitalization  Diabetic mgmt: No     Previous Home Environment Care Facility Name: [facility]  Additional Comments: Previously living in infependent living at [facility].  Discharge Living Setting Plans for Discharge Living Setting: Apartment, Other (Comment)(plan is to go to ALF ([facility])) Type of Home at Discharge: Assisted living Care Facility Name at Discharge: [facility] (once bed available) Discharge Home Layout: One level Discharge Home Access: Level entry Discharge Bathroom Shower/Tub: Other (comment)(most likly walk in shower) Discharge Bathroom Toilet: Standard Discharge Bathroom Accessibility: Yes How Accessible: Accessible via wheelchair, Accessible via  walker Does the patient have any problems obtaining your medications?: No  Social/Family/Support Systems Patient Roles: Other (Comment)(resident at [facility] ILF) Contact Information: pt has medical POA: [name] ([phone] Anticipated Caregiver: [name], plus 2 additional sitters/sign language intrepter who can stay with pt for 24/7 A if needed ALF bed not available immediately Anticipated Caregiver's Contact Information: see [name] contact info above Ability/Limitations of Caregiver: 24/7 supervision available-plans to admit pt to new ALF for assistance as needed Caregiver Availability: 24/7 Discharge Plan Discussed with Primary Caregiver: Yes(with pt and [name] (intrepter present)) Is Caregiver In Agreement with Plan?: Yes Does Caregiver/Family have Issues with Lodging/Transportation while Pt is in Rehab?: No   Goals/Additional Needs Patient/Family Goal for Rehab: PT/OT/SLP: Supervision  Expected length of stay: 5-7 days Cultural Considerations: Attends [employer] Dietary Needs: Heart Healthy, thin liquids Equipment Needs: TBD Special Service Needs: Sign language intrepter services Additional Information: [phone] for intrepreting services Pt/Family Agrees to Admission and willing to participate: Yes Program Orientation Provided & Reviewed with Pt/Caregiver Including Roles  & Responsibilities: Yes(reviewed with pt and Medical POA)  Barriers to Discharge: Medication compliance, Behavior(was initially refusing meds at one point; pt has Austism)   Decrease burden of Care through IP rehab admission: NA  Possible need for SNF placement upon discharge: Not anticipated; Plan is for pt to go to ALF (if bed not available when ready to DC from CIR, support systems in place for 24/7 A at her ILF until bed ready). Plan is [facility] vs return to [facility]   Patient Condition: This patient's medical and functional status has changed since  the consult dated: 07/17/18 in which the Rehabilitation Physician determined and documented that the patient's condition is appropriate for intensive rehabilitative care in an inpatient rehabilitation facility. Medical changes are: extension of stroke due to abrupt drop in BP on 07/18/18.  Functional changes are: decline from Min Guard 20 feet to most recently Min/Mod Ax2 for 75   feet and Min/Mod A for transfers . AC has addressed the consulting MD concerns regarding her baseline level of functioning and caregiver support at DC. PTA, pt was Mod I for ADLs and ambulation with Rollator; Now, pt is requiring extensive assist. Plan is for pt to DC to ALF after CIR for 24/7 Supervision. After evaluating the patient today and speaking with the Rehabilitation physician and acute team, the patient remains appropriate for inpatient rehab. Will admit to inpatient rehab today.  Preadmission Screen Completed By:  Jhonnie Garner, 07/19/2018 12:04 PM ______________________________________________________________________   Discussed status with Dr. Naaman Plummer on 07/19/18 at 12:03pm and received telephone approval for admission today.  Admission Coordinator:  Jhonnie Garner, time 12:03PM/Date 07/19/18

## 2018-07-19 NOTE — Progress Notes (Addendum)
PMR Admission Coordinator Pre-Admission Assessment  Patient: Laura [name] is an 77 y.o., female MRN: [id number] DOB: 15-Apr-1941 Height: 5' 3"  (160 cm) Weight: 57.3 kg                                                                                                                                                  Insurance Information HMO:     PPO:      PCP:      IPA:      80/20: Yes     OTHER:  PRIMARY: Medicare Part A and B      Policy#: [id number]      Subscriber: Patient CM Name:       Phone#:      Fax#:  Pre-Cert#:       Employer:  Benefits:  Phone #: NA     Name: Verified via [place] on 07/19/18 Eff. Date: Part A effective: 05/23/1972; Part B effective: 05/23/1972     Deduct: $1,364      Out of Pocket Max: NA      Life Max: NA CIR: Covered per Medicare Guidelines once yearly deductible is met      SNF: days 1-20, 100%; days 21-100, 80% Outpatient: 80%     Co-Pay: 20% Home Health: 100%      Co-Pay:  DME: 80%     Co-Pay: 20% Providers: Pt's Choice  SECONDARY:  [employer]       Policy#: [id number]      Subscriber: Patient CM Name:       Phone#:      Fax#:  Pre-Cert#:       Employer:  Benefits:  Phone #: [phone]     Name:  Eff. 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 POA reported 2-week history of cognitive and language impairments with waxing and waning of  symptoms and confusion as well as occasional staring off activity. CT head was negative for acute stroke. MRI of brain done revealing cluster of small acute infarcts in the distal left PCA territory,ventriculomegaly from left greater than right volume loss remote left temporal lobe hemorrhage and left lateral lenticulostriate infarct. Neurology consulted for input and recommended continuing Keppra twice daily question of compliance prior to admission. 2D echo showed EF of 55 to 60% with no wall abnormality.Bilateral lower extreme Dopplers were negative for DVT  Dr. Lovena Le consulted for input on bradycardia and did not feel the need for PPM aspatient without clinical symptoms and mildly prolonged QRS noted on EKG. He recommended avoiding AV nodal blocking agents and follow-up for 2-week monitor on outpatient basis.She did develop right-sided weakness with brief loss of consciousness on 8:26 PM after receiving IV hydralazine for elevated blood pressure--patient has been noncompliant with meds during admission. Repeat MRI of brain showed increase in number of acute distal left ACA infarcts. Dr Erlinda Hong felt thatexternal stroke was due to hypotension and recommended compliance with medications Complete NIHSS TOTAL: 3  Past Medical History      Past Medical History:  Diagnosis Date  . Anxiety state, unspecified 10/10/2007  . Arthritis   . ASYMPTOMATIC POSTMENOPAUSAL STATUS 04/15/2009  . Cataract    removed both eyes   . Deaf   . Dermatitis   . Headache(784.0) 08/27/2008  . HYPERLIPIDEMIA 06/22/2007   rx refused  . HYPERTENSION 05/07/2008  . HYPOTHYROIDISM 04/15/2009  . Mild mental retardation   . 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 RESTLESS LEG SYNDROME 10/01/2008  . TACHYCARDIA 08/27/2008  . TREMOR 11/21/2007  . TRIGGER FINGER 10/30/2009    Family  History  family history is not on file.  Prior Rehab/Hospitalizations:  Has the patient had major surgery during 100 days prior to admission? No  Current Medications   Current Facility-Administered Medications:  .  0.9 %  sodium chloride infusion, , Intravenous, Continuous, Florencia Reasons, MD, Last Rate: 75 mL/hr at 07/19/18 0641 .  acetaminophen (TYLENOL) tablet 650 mg, 650 mg, Oral, Q6H PRN, 650 mg at 07/15/18 1622 **OR** acetaminophen (TYLENOL) suppository 650 mg, 650 mg, Rectal, Q6H PRN, Etta Quill, DO .  aspirin EC tablet 325 mg, 325 mg, Oral, Daily, Rosalin Hawking, MD, 325 mg at 07/19/18 0844 .  atorvastatin (LIPITOR) tablet 80 mg, 80 mg, Oral, q1800, Lilyan Gilford I, NP, 80 mg at 07/17/18 1726 .  clonazePAM (KLONOPIN) disintegrating tablet 0.25 mg, 0.25 mg, Oral, QHS, Florencia Reasons, MD, 0.25 mg at 07/17/18 2020 .  clonazePAM (KLONOPIN) tablet 1 mg, 1 mg, Oral, TID PRN, Etta Quill, DO, 1 mg at 07/15/18 1622 .  clopidogrel (PLAVIX) tablet 75 mg, 75 mg, Oral, Daily, Rosalin Hawking, MD, 75 mg at 07/19/18 0843 .  levETIRAcetam (KEPPRA) IVPB 500 mg/100 mL premix, 500 mg, Intravenous, Q12H, Florencia Reasons, MD, Last Rate: 400 mL/hr at 07/19/18 0850, 500 mg at 07/19/18 0850 .  levothyroxine (SYNTHROID, LEVOTHROID) tablet 50 mcg, 50 mcg, Oral, QAC breakfast, Etta Quill, DO, 50 mcg at 07/19/18 0807 .  ondansetron (ZOFRAN) tablet 4 mg, 4 mg, Oral, Q6H PRN **OR** ondansetron (ZOFRAN) injection 4 mg, 4 mg, Intravenous, Q6H PRN, Alcario Drought, Jared M, DO .  zolpidem (AMBIEN) tablet 5 mg, 5 mg, Oral, QHS PRN, Florencia Reasons, MD, 5 mg at 07/17/18 2020  Patients Current Diet:     Diet Order  Diet Heart Room service appropriate? Yes; Fluid consistency: Thin  Diet effective now               Precautions / Restrictions Precautions Precautions: Fall Restrictions Weight Bearing Restrictions: No   Has the patient had 2 or more falls or a fall with injury in the past  year?Yes  Prior Activity Level Community (5-7x/wk): would walk halls of her ILF (pt ruminates and likes to walk extnesively; pt is very habitual and follows schedules  Development worker, international aid / Equipment  Prior Device Use: Indicate devices/aids used by the patient prior to current illness, exacerbation or injury? Walker  Prior Functional Level Prior Function Level of Independence: Independent with assistive device(s) Comments: Pt living in independent living facility. Over the past few weeks she has demonstrated cognitive decline per POA. She has a history of falling and staff will find her down in room at times when they notice her absent at breakfast.   Self Care: Did the patient need help bathing, dressing, using the toilet or eating?  Independent (set up for meals by ILF)  Indoor Mobility: Did the patient need assistance with walking from room to room (with or without device)? Independent  Stairs: Did the patient need assistance with internal or external stairs (with or without device)? Needed some help; used elevator at [place]: Did the patient need help planning regular tasks such as shopping or remembering to take medications? Needed some help  Current Functional Level Cognition  Overall Cognitive Status: Impaired/Different from baseline Difficult to assess due to: Hard of hearing/deaf Current Attention Level: Sustained Orientation Level: Oriented to person, Oriented to place Following Commands: Follows one step commands inconsistently, Follows one step commands with increased time Safety/Judgement: Decreased awareness of safety General Comments: pt is very easily distracted by environment and preoccupied with telemetry box; Pt has autism at baseline but has demonstrated cognitive decline beginning yesterday 8/25 per family present. Pt requiring mod - max cuing from interpreter. Pt required increased time to complete all tasks     Extremity Assessment (includes Sensation/Coordination)  Upper Extremity Assessment: RUE deficits/detail RUE Deficits / Details: Appears weak compared with L. Signs with L hand today. Slow movement noted.   Lower Extremity Assessment: RLE deficits/detail RLE Deficits / Details: Difficulty raising RLE into bed.  RLE Coordination: decreased fine motor, decreased gross motor    ADLs  Overall ADL's : Needs assistance/impaired Eating/Feeding: Set up, Sitting Grooming: Minimal assistance, Sitting Upper Body Bathing: Supervision/ safety, Sitting Lower Body Bathing: Minimal assistance, Sit to/from stand Upper Body Dressing : Supervision/safety, Sitting Lower Body Dressing: Minimal assistance, Sit to/from stand Toilet Transfer: Moderate assistance Toilet Transfer Details (indicate cue type and reason): Min assist from bed and mod assist from toilet.  Toileting- Clothing Manipulation and Hygiene: Supervision/safety, Sitting/lateral lean Functional mobility during ADLs: Moderate assistance, Cueing for safety, Cueing for sequencing General ADL Comments: Pt ambulating initially with RW but seemed to need more cuing with AD. Pt required mod hand held assistance to ambulate to and from bathroom. Pt able to perform hygiene with min - mod A standing balance and assistance for thoroughness.    Mobility  Overal bed mobility: Needs Assistance Bed Mobility: Sit to Supine Supine to sit: Max assist Sit to supine: Min assist General bed mobility comments: pt sitting EOB with NT present upon arrival; pt requires assistance to bring bilat LE into bed and max multimodal cues for initiating task    Transfers  Overall transfer level: Needs assistance  Equipment used: Rolling walker (2 wheeled), 1 person hand held assist Transfers: Sit to/from Stand Sit to Stand: Min assist, Mod assist General transfer comment: min A from EOB and mod A from commode with use of grab bar    Ambulation / Gait / Stairs /  Wheelchair Mobility  Ambulation/Gait Ambulation/Gait assistance: Min assist, +2 physical assistance, Mod assist Gait Distance (Feet): (75) Assistive device: Rolling walker (2 wheeled), 1 person hand held assist, 2 person hand held assist Gait Pattern/deviations: Step-to pattern, Decreased step length - right, Decreased stride length, Decreased dorsiflexion - right General Gait Details: initially ambulating with RW however RW seems to distract pt more than assist with balance; pt able to ambulate with mod A +1 and/or min A +2 HHA; max cues for navigating environment and for increased R LE step lengths; pt at times leaving R LE behind  Gait velocity: decreased    Posture / Balance Balance Overall balance assessment: Needs assistance Sitting-balance support: Feet supported Sitting balance-Leahy Scale: Fair Standing balance support: Bilateral upper extremity supported, During functional activity Standing balance-Leahy Scale: Poor Standing balance comment: Requires UE support.     Special needs/care consideration BiPAP/CPAP: No CPM: No Continuous Drip IV: receiving Keppra IV and .9% Sodium Chloride infusion  Dialysis:No        Days: No Life Vest: no Oxygen: no Special Bed: no Trach Size: no Wound Vac (area): no      Location: no Skin:  Generalized scabs on LLE and Arm; pt likes to pick at skin according to POA;                  Bowel mgmt:Last BM: 07/18/18; recent incontinence  Bladder mgmt: recent incontinence since hospitalization  Diabetic mgmt: No     Previous [place] Name: Alfredo Bach  Additional Comments: Previously living in infependent living at [facility].  Discharge Living Setting Plans for Discharge Living Setting: Apartment, Other (Comment)(plan is to go to ALF [facility])) Type of Home at Discharge: [place] Name at Discharge: [facility] (once bed available) Discharge Home Layout: One level Discharge Home  Access: Level entry Discharge Bathroom Shower/Tub: Other (comment)(most likly walk in shower) Discharge Bathroom Toilet: Standard Discharge Bathroom Accessibility: Yes How Accessible: Accessible via wheelchair, Accessible via walker Does the patient have any problems obtaining your medications?: No  Social/Family/Support Systems Patient Roles: Other (Comment)(resident at [place]) Contact Information: pt has medical POA: Henderson Newcomer ([phone] Anticipated Caregiver: Manuela Schwartz, plus 2 additional sitters/sign language intrepter who can stay with pt for 24/7 A if needed ALF bed not available immediately Anticipated Caregiver's Contact Information: see Manuela Schwartz contact info above Ability/Limitations of Caregiver: 24/7 supervision available-plans to admit pt to new ALF for assistance as needed Caregiver Availability: 24/7 Discharge Plan Discussed with Primary Caregiver: Yes(with pt and Henderson Newcomer (intrepter present)) Is Caregiver In Agreement with Plan?: Yes Does Caregiver/Family have Issues with Lodging/Transportation while Pt is in Rehab?: No   Goals/Additional Needs Patient/Family Goal for Rehab: PT/OT/SLP: Supervision  Expected length of stay: 5-7 days Cultural Considerations: Attends [facility] Dietary Needs: Heart Healthy, thin liquids Equipment Needs: TBD Special Service Needs: Sign language intrepter services Additional Information: [phone] for intrepreting services Pt/Family Agrees to Admission and willing to participate: Yes Program Orientation Provided & Reviewed with Pt/Caregiver Including Roles  & Responsibilities: Yes(reviewed with pt and Medical POA)  Barriers to Discharge: Medication compliance, Behavior(was initially refusing meds at one point; pt has Austism)   Decrease  burden of Care through IP rehab admission: NA  Possible need for SNF placement upon discharge: Not anticipated; Plan is for pt to go to ALF (if bed not available  when ready to DC from CIR, support systems in place for 24/7 A at her ILF until bed ready). Plan is [facility] ALF vs return to [place]   Patient Condition: This patient's medical and functional status has changed since the consult dated: 07/17/18 in which the Rehabilitation Physician determined and documented that the patient's condition is appropriate for intensive rehabilitative care in an inpatient rehabilitation facility. Medical changes are: extension of stroke due to abrupt drop in BP on 07/18/18.  Functional changes are: decline from [place] 20 feet to most recently Min/Mod Ax2 for 75 feet and Min/Mod A for transfers . AC has addressed the consulting MD concerns regarding her baseline level of functioning and caregiver support at DC. PTA, pt was Mod I for ADLs and ambulation with Rollator; Now, pt is requiring extensive assist. Plan is for pt to DC to ALF after CIR for 24/7 Supervision. After evaluating the patient today and speaking with the Rehabilitation physician and acute team, the patient remains appropriate for inpatient rehab. Will admit to inpatient rehab today.  Preadmission Screen Completed By:  Jhonnie Garner, 07/19/2018 12:04 PM ______________________________________________________________________   Discussed status with Dr. Naaman Plummer on 07/19/18 at 12:03pm and received telephone approval for admission today.  Admission Coordinator:  Jhonnie Garner, time 12:03PM/Date 07/19/18           Cosigned by: Meredith Staggers, MD at 07/19/2018 1:30 PM  Revision History

## 2018-07-19 NOTE — H&P (Addendum)
Physical Medicine and Rehabilitation Admission H&P     Chief Complaint  Patient presents with  . Dizziness  . 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 POA reported 2-week history of cognitive and language impairments with waxing and waning of symptoms, confusion, Ms. spelling due to signing as well as occasional staring off activity. CT head was negative for acute stroke. MRI of brain done revealing cluster of small acute infarcts in the distal left PCA territory, ventriculomegaly from left greater than right volume loss remote left temporal lobe hemorrhage and left lateral lenticulostriate infarct. Neurology consulted for input and recommended continuing Keppra twice daily question of compliance prior to admission. 2D echo showed EF of 55 to 60% with no wall abnormality. Bilateral lower extreme Dopplers were negative for DVT  Dr. Ladona Ridgelaylor consulted for input on bradycardia and did not feel the need for PPM as patient without clinical symptoms and mildly prolonged QRS noted on EKG. He recommended avoiding AV nodal blocking agents and follow-up for 2-week monitor on outpatient basis. She did develop right-sided weakness with brief loss of consciousness on 8:26 PM after receiving IV hydralazine for elevated blood pressure--patient has not been taking meds consistently during admission. Repeat MRI of brain showed increase in number of acute distal left ACA infarcts. Dr Roda ShuttersXu felt that extension of stroke was due to abrupt drop in BP and to continue DAPT for 3 months followed by Plavix alone and to avoid hypotension. as due to hypotension and recommended compliance with medications. Therapy ongoing and patient limited by increase in weakness, decline in cognitive/communicative ability, visual impairments and poor safety. CIR recommended  due to functional decline.  With assistance of interpreter and POA:  Review of Systems  Unable to perform ROS: Other  HENT: Positive for hearing loss. Negative for tinnitus.  Eyes:  Left visual field cut and question decrease in right eye.  Respiratory: Negative for cough and shortness of breath.  Cardiovascular: Negative for chest pain and palpitations.  Gastrointestinal: Negative for heartburn and nausea.  Genitourinary:  Incontinence since storke.  Musculoskeletal: Negative for back pain and myalgias.  Neurological: Positive for tingling (BLE tend to "vibrate"/RLS), sensory change and speech change (ability to communicat has declined). Negative for dizziness and headaches.  Psychiatric/Behavioral: Positive for memory loss. The patient is nervous/anxious (tends to pick and scratch).       Past Medical History:  Diagnosis Date  . Anxiety state, unspecified 10/10/2007  . Arthritis   . ASYMPTOMATIC POSTMENOPAUSAL STATUS 04/15/2009  . Cataract    removed both eyes   . Deaf   . Dermatitis   . Headache(784.0) 08/27/2008  . HYPERLIPIDEMIA 06/22/2007   rx refused  . HYPERTENSION 05/07/2008  . HYPOTHYROIDISM 04/15/2009  . Mild mental retardation   . 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 RESTLESS LEG SYNDROME 10/01/2008  . TACHYCARDIA 08/27/2008  . TREMOR 11/21/2007  . TRIGGER FINGER 10/30/2009        Past Surgical History:  Procedure Laterality Date  . CATARACT EXTRACTION, BILATERAL    . COLONOSCOPY          Family History  Problem Relation Age of Onset  . Thyroid disease Neg Hx   . Colon cancer Neg Hx   . Colon polyps Neg Hx   . Rectal cancer Neg Hx   . Stomach cancer Neg Hx  Social History: Has been [facility] at [facility] to transition her to AL section after CIR. POA also professional interpreter. Per reports that she has never smoked. She has never used smokeless tobacco. She reports that she does not drink alcohol or use drugs.  Allergies: No Known  Allergies        Medications Prior to Admission  Medication Sig Dispense Refill  . alendronate (FOSAMAX) 70 MG tablet TAKE 1 TABLET BY MOUTH ONCE A WEEK . TAKE WITH A FULL GLASS OF WATER ON AN EMPTY STOMACH (Patient taking differently: Take 70 mg by mouth once a week. On Saturday) 4 tablet 11  . amLODipine (NORVASC) 10 MG tablet Take 1 tablet (10 mg total) by mouth daily.    . cholecalciferol (VITAMIN D) 1000 UNITS tablet Take 1,000 Units by mouth daily.    . clonazePAM (KLONOPIN) 1 MG tablet Take 1 tablet (1 mg total) by mouth 3 (three) times daily as needed (tremor). 90 tablet 2  . levETIRAcetam (KEPPRA) 500 MG tablet Take 1 tablet (500 mg total) by mouth 2 (two) times daily.    Marland Kitchen levothyroxine (SYNTHROID, LEVOTHROID) 50 MCG tablet Take 1 tablet (50 mcg total) by mouth daily. 90 tablet 1  . Multiple Vitamin (MULTIVITAMIN) tablet Take 1 tablet by mouth daily.     Marland Kitchen triamcinolone cream (KENALOG) 0.1 % APPLY CREAM EXTERNALLY THREE TIMES DAILY AS NEEDED FOR ITCHING (Patient not taking: Reported on 07/14/2018) 80 g 2   Drug Regimen Review  Drug regimen was reviewed and remains appropriate with no significant issues identified  Home:  Home Living  Family/patient expects to be discharged to:: Inpatient rehab  Additional Comments: Previously living in infependent living at [facility].  Functional History:  Prior Function  Level of Independence: Independent with assistive device(s)  Comments: Pt living in independent living facility. Over the past few weeks she has demonstrated cognitive decline per POA. She has a history of falling and staff will find her down in room at times when they notice her absent at breakfast.  Functional Status:  Mobility:  Bed Mobility  Overal bed mobility: Needs Assistance  Bed Mobility: Sit to Supine  Supine to sit: Max assist  Sit to supine: Min assist  General bed mobility comments: pt sitting EOB with NT present upon arrival; pt requires assistance to bring  bilat LE into bed and max multimodal cues for initiating task  Transfers  Overall transfer level: Needs assistance  Equipment used: Rolling walker (2 wheeled), 1 person hand held assist  Transfers: Sit to/from Stand  Sit to Stand: Min assist, Mod assist  General transfer comment: min A from EOB and mod A from commode with use of grab bar  Ambulation/Gait  Ambulation/Gait assistance: Min assist, +2 physical assistance, Mod assist  Gait Distance (Feet): (75)  Assistive device: Rolling walker (2 wheeled), 1 person hand held assist, 2 person hand held assist  Gait Pattern/deviations: Step-to pattern, Decreased step length - right, Decreased stride length, Decreased dorsiflexion - right  General Gait Details: initially ambulating with RW however RW seems to distract pt more than assist with balance; pt able to ambulate with mod A +1 and/or min A +2 HHA; max cues for navigating environment and for increased R LE step lengths; pt at times leaving R LE behind  Gait velocity: decreased   ADL:  ADL  Overall ADL's : Needs assistance/impaired  Eating/Feeding: Set up, Sitting  Grooming: Minimal assistance, Sitting  Upper Body Bathing: Supervision/ safety, Sitting  Lower Body Bathing: Minimal assistance, Sit to/from stand  Upper Body Dressing : Supervision/safety, Sitting  Lower Body Dressing: Minimal assistance, Sit to/from stand  Toilet Transfer: Moderate assistance  Toilet Transfer Details (indicate cue type and reason): Min assist from bed and mod assist from toilet.  Toileting- Clothing Manipulation and Hygiene: Supervision/safety, Sitting/lateral lean  Functional mobility during ADLs: Moderate assistance, Cueing for safety, Cueing for sequencing  General ADL Comments: Pt ambulating initially with RW but seemed to need more cuing with AD. Pt required mod hand held assistance to ambulate to and from bathroom. Pt able to perform hygiene with min - mod A standing balance and assistance for  thoroughness.  Cognition:  Cognition  Overall Cognitive Status: Impaired/Different from baseline  Orientation Level: Oriented to person, Oriented to place  Cognition  Arousal/Alertness: Awake/alert  Behavior During Therapy: Flat affect  Overall Cognitive Status: Impaired/Different from baseline  Area of Impairment: Attention, Problem solving, Following commands, Safety/judgement, Awareness  Current Attention Level: Sustained  Following Commands: Follows one step commands inconsistently, Follows one step commands with increased time  Safety/Judgement: Decreased awareness of safety  Awareness: Intellectual  Problem Solving: Decreased initiation, Slow processing, Difficulty sequencing  General Comments: pt is very easily distracted by environment and preoccupied with telemetry box; Pt has autism at baseline but has demonstrated cognitive decline beginning yesterday 8/25 per family present. Pt requiring mod - max cuing from interpreter. Pt required increased time to complete all tasks  Difficult to assess due to: Hard of hearing/deaf  Blood pressure (!) 153/86, pulse (!) 106, temperature 97.6 F (36.4 C), temperature source Oral, resp. rate 18, height 5\' 3"  (1.6 m), weight 57.3 kg, SpO2 100 %.   Physical Exam  Nursing note and vitals reviewed.  Constitutional: She appears well-developed and well-nourished.  HENT:  Head: Normocephalic and atraumatic.  Eyes: Pupils are equal, round, and reactive to light. EOM are normal.  Neck: Normal range of motion. Neck supple.  Cardiovascular: Normal rate. Exam reveals no gallop and no friction rub.  No murmur heard.  Respiratory: Effort normal. No respiratory distress. She has no wheezes.  GI: Soft. Bowel sounds are normal. She exhibits no distension. There is no tenderness.  Musculoskeletal:  Healing excoriations on LLE.  Neurological: She is alert.  Non verbal. Makes eye contact and follows commands with delay when cued physically and by  interpreter. RUE 4/5, RLE 3-4/5 but inconsistent. LUE and LLE 4+ to 5/5. Seems to sense pain more on the left than right. Mild right central 7.  Skin:  Well healed scars upper back (excoriations)  Psychiatric:  Pleasant but does not engage except with substantial cues   Lab Results Last 48 Hours  Imaging Results (Last 48 hours)     Medical Problem List and Plan:  1. Right sided weakness and cognitive deficits secondary to Left ACA stroke.  -admit to inpatient rehab  -given pt's need for interpreter and autism, we are going to try to keep therapies at a consistent time each day. Family states that she is much better from an activity tolerance standpoint if she has a schedule.  2. DVT Prophylaxis/Anticoagulation: Pharmaceutical: Lovenox  3. Pain Management: N/A  4. Mood: Maintain consistent schedule/routine. LCSW to follow for assistance,  5. Neuropsych: This patient is not capable of making decisions on her own behalf.  6. Skin/Wound Care: routine pressure relief measures.  7. Fluids/Electrolytes/Nutrition: Monitor I/O. Check lytes in am.  -encourage PO  8. HTN: Monitor bid --avoid hypotension.  9. Hypothyroid: On supplement.  10. RLS/Anxiety disorder: used Klonopin prn. Currently refusing use.  11. Seizure disorder post ICH: On  Keppra bid.    Post Admission Physician Evaluation:  1. Functional deficits secondary to Left ACA infarct. 2. Patient is admitted to receive collaborative, interdisciplinary care between the physiatrist, rehab nursing staff, and therapy team. 3. Patient's level of medical complexity and substantial therapy needs in context of that medical necessity cannot be provided at a lesser intensity of care such as a SNF. 4. Patient has experienced substantial functional loss from his/her baseline which was documented above under the "Functional History" and "Functional Status" headings. Judging by the patient's diagnosis, physical exam, and functional history, the patient has potential for functional progress which will result in measurable gains while on inpatient rehab. These gains will be of substantial and practical use upon discharge in facilitating mobility and self-care at the household level. 5. Physiatrist will provide 24 hour management of medical needs as well as oversight of the therapy plan/treatment and provide guidance as appropriate regarding the interaction of the two. 6. The Preadmission Screening has been reviewed and patient status is unchanged unless otherwise stated above. 7. 24 hour rehab nursing will assist with bladder management, bowel management, safety, skin/wound care, disease management, medication administration, pain management and patient education and help integrate therapy concepts, techniques,education, etc. 8. PT will assess and treat for/with: Lower extremity strength, range of motion, stamina, balance, functional mobility, safety, adaptive techniques and equipment, NMR, family education, ego support. Goals are: supervision. 9. OT will assess and treat for/with: ADL's, functional mobility, safety, upper extremity strength, adaptive techniques and equipment, NMR, family education. Goals are: supervision. Therapy may proceed with showering this patient. 10. SLP will assess and  treat for/with: cognition, communication, family education. Goals are: supervision to min assist. 11. Case Management and Social Worker will assess and treat for psychological issues and discharge planning. 12. Team conference will be held weekly to assess progress toward goals and to determine barriers to discharge. 13. Patient will receive at least 3 hours of therapy per day at least 5 days per week. 14. ELOS: 7-10 days  15. Prognosis: excellent   I have personally performed a face to face diagnostic evaluation of this patient and formulated the key components of the plan. Additionally, I have personally reviewed laboratory data, imaging studies, as well as relevant notes and concur with the physician assistant's documentation above.   Ranelle Oyster, MD, FAAPMR  Jacquelynn Cree, PA-C  07/19/2018 Dr.  Donn Pierini original post admission physician evaluation remains appropriate, and any changes from the pre-admission screening or documentation from the acute chart are noted above.

## 2018-07-19 NOTE — Care Management Note (Signed)
Case Management Note  Patient Details  Name: Laura [name] MRN: [id number] Date of Birth: Jun 23, 1941  Subjective/Objective:                    Action/Plan: Pt discharging to CIR today. CM signing off.   Expected Discharge Date:  07/19/18               Expected Discharge Plan:  IP Rehab Facility  In-House Referral:     Discharge planning Services  CM Consult  Post Acute Care Choice:    Choice offered to:     DME Arranged:    DME Agency:     HH Arranged:    HH Agency:     Status of Service:  Completed, signed off  If discussed at [employer] Meetings, dates discussed:    Additional Comments:  Kermit BaloKelli F Maleka Contino, RN 07/19/2018, 12:23 PM

## 2018-07-19 NOTE — H&P (Signed)
Physical Medicine and Rehabilitation Admission H&P    Chief Complaint  Patient presents with  . Dizziness  . 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  POA reported 2-week history of cognitive and language impairments with waxing and waning of symptoms, confusion, Ms. spelling due to signing as well as occasional staring off activity.  CT head was negative for acute stroke.  MRI of brain done revealing cluster of small acute infarcts in the distal left PCA territory, ventriculomegaly from left greater than right  volume loss remote left temporal lobe hemorrhage and left lateral lenticulostriate infarct.  Neurology consulted for input and recommended continuing Keppra twice daily question of compliance prior to admission.  2D echo showed EF of 55 to 60% with no wall abnormality.  Bilateral lower extreme Dopplers were negative for DVT  Dr. Lovena Le consulted for input on bradycardia and did not feel the need for PPM as patient without clinical symptoms and mildly prolonged QRS noted on EKG.  He recommended avoiding AV nodal blocking agents and follow-up for 2-week monitor on outpatient basis.  She did develop right-sided weakness with brief loss of consciousness on 8:26 PM after receiving IV hydralazine for elevated blood pressure--patient has not been taking meds consistently during admission.  Repeat MRI of brain showed increase in number of acute distal left ACA infarcts. Dr Erlinda Hong felt that extension of stroke was due to abrupt drop in BP and to continue DAPT for 3 months followed by Plavix alone and to avoid hypotension. as due to hypotension and recommended compliance with medications. Therapy ongoing and patient limited by increase in weakness, decline in cognitive/communicative ability, visual impairments and poor safety.  CIR recommended due to functional decline.     With assistance of interpreter and POA: Review of Systems  Unable to perform ROS: Other  HENT: Positive for hearing loss. Negative for tinnitus.   Eyes:       Left visual field cut and question decrease in right eye.   Respiratory: Negative for cough and shortness of breath.   Cardiovascular: Negative for chest pain and palpitations.  Gastrointestinal: Negative for heartburn and nausea.  Genitourinary:       Incontinence since storke.   Musculoskeletal: Negative for back pain and myalgias.  Neurological: Positive for tingling (BLE tend to "vibrate"/RLS), sensory change and speech change (ability to communicat has declined). Negative for dizziness and headaches.  Psychiatric/Behavioral: Positive for memory loss. The patient is nervous/anxious (tends to pick and scratch).       Past Medical History:  Diagnosis Date  . Anxiety state, unspecified 10/10/2007  . Arthritis   . ASYMPTOMATIC POSTMENOPAUSAL STATUS 04/15/2009  . Cataract    removed both eyes   . Deaf   . Dermatitis   . Headache(784.0) 08/27/2008  . HYPERLIPIDEMIA 06/22/2007   rx refused  . HYPERTENSION 05/07/2008  . HYPOTHYROIDISM 04/15/2009  . Mild mental retardation   . 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 RESTLESS LEG SYNDROME 10/01/2008  . TACHYCARDIA 08/27/2008  . TREMOR 11/21/2007  . TRIGGER FINGER 10/30/2009    Past Surgical History:  Procedure Laterality Date  . CATARACT EXTRACTION, BILATERAL    . COLONOSCOPY      Family History  Problem Relation Age of Onset  . Thyroid disease Neg Hx   . Colon cancer Neg Hx   .  Colon polyps Neg Hx   . Rectal cancer Neg Hx   . Stomach cancer Neg Hx     Social History:  Has been living at IL at [facility] to transition her to AL section after CIR. POA also professional interpreter. Per reports that she has never smoked. She has never used smokeless tobacco. She reports that she does not drink alcohol or use  drugs.    Allergies: No Known Allergies    Medications Prior to Admission  Medication Sig Dispense Refill  . alendronate (FOSAMAX) 70 MG tablet TAKE 1 TABLET BY MOUTH ONCE A WEEK .  TAKE  WITH  A  FULL  GLASS  OF  WATER  ON  AN  EMPTY  STOMACH (Patient taking differently: Take 70 mg by mouth once a week. On Saturday) 4 tablet 11  . amLODipine (NORVASC) 10 MG tablet Take 1 tablet (10 mg total) by mouth daily.    . cholecalciferol (VITAMIN D) 1000 UNITS tablet Take 1,000 Units by mouth daily.    . clonazePAM (KLONOPIN) 1 MG tablet Take 1 tablet (1 mg total) by mouth 3 (three) times daily as needed (tremor). 90 tablet 2  . levETIRAcetam (KEPPRA) 500 MG tablet Take 1 tablet (500 mg total) by mouth 2 (two) times daily.    Marland Kitchen levothyroxine (SYNTHROID, LEVOTHROID) 50 MCG tablet Take 1 tablet (50 mcg total) by mouth daily. 90 tablet 1  . Multiple Vitamin (MULTIVITAMIN) tablet Take 1 tablet by mouth daily.      Marland Kitchen triamcinolone cream (KENALOG) 0.1 % APPLY  CREAM EXTERNALLY THREE TIMES DAILY AS NEEDED FOR ITCHING (Patient not taking: Reported on 07/14/2018) 80 g 2    Drug Regimen Review  Drug regimen was reviewed and remains appropriate with no significant issues identified  Home: Home Living Family/patient expects to be discharged to:: Inpatient rehab Additional Comments: Previously living in infependent living at [facility].   Functional History: Prior Function Level of Independence: Independent with assistive device(s) Comments: Pt living in independent living facility. Over the past few weeks she has demonstrated cognitive decline per POA. She has a history of falling and staff will find her down in room at times when they notice her absent at breakfast.   Functional Status:  Mobility: Bed Mobility Overal bed mobility: Needs Assistance Bed Mobility: Sit to Supine Supine to sit: Max assist Sit to supine: Min assist General bed mobility comments: pt sitting EOB with NT present upon  arrival; pt requires assistance to bring bilat LE into bed and max multimodal cues for initiating task Transfers Overall transfer level: Needs assistance Equipment used: Rolling walker (2 wheeled), 1 person hand held assist Transfers: Sit to/from Stand Sit to Stand: Min assist, Mod assist General transfer comment: min A from EOB and mod A from commode with use of grab bar Ambulation/Gait Ambulation/Gait assistance: Min assist, +2 physical assistance, Mod assist Gait Distance (Feet): (75) Assistive device: Rolling walker (2 wheeled), 1 person hand held assist, 2 person hand held assist Gait Pattern/deviations: Step-to pattern, Decreased step length - right, Decreased stride length, Decreased dorsiflexion - right General Gait Details: initially ambulating with RW however RW seems to distract pt more than assist with balance; pt able to ambulate with mod A +1 and/or min A +2 HHA; max cues for navigating environment and for increased R LE step lengths; pt at times leaving R LE behind  Gait velocity: decreased    ADL: ADL Overall ADL's : Needs assistance/impaired Eating/Feeding: Set up, Sitting Grooming: Minimal  assistance, Sitting Upper Body Bathing: Supervision/ safety, Sitting Lower Body Bathing: Minimal assistance, Sit to/from stand Upper Body Dressing : Supervision/safety, Sitting Lower Body Dressing: Minimal assistance, Sit to/from stand Toilet Transfer: Moderate assistance Toilet Transfer Details (indicate cue type and reason): Min assist from bed and mod assist from toilet.  Toileting- Clothing Manipulation and Hygiene: Supervision/safety, Sitting/lateral lean Functional mobility during ADLs: Moderate assistance, Cueing for safety, Cueing for sequencing General ADL Comments: Pt ambulating initially with RW but seemed to need more cuing with AD. Pt required mod hand held assistance to ambulate to and from bathroom. Pt able to perform hygiene with min - mod A standing balance and  assistance for thoroughness.  Cognition: Cognition Overall Cognitive Status: Impaired/Different from baseline Orientation Level: Oriented to person, Oriented to place Cognition Arousal/Alertness: Awake/alert Behavior During Therapy: Flat affect Overall Cognitive Status: Impaired/Different from baseline Area of Impairment: Attention, Problem solving, Following commands, Safety/judgement, Awareness Current Attention Level: Sustained Following Commands: Follows one step commands inconsistently, Follows one step commands with increased time Safety/Judgement: Decreased awareness of safety Awareness: Intellectual Problem Solving: Decreased initiation, Slow processing, Difficulty sequencing General Comments: pt is very easily distracted by environment and preoccupied with telemetry box; Pt has autism at baseline but has demonstrated cognitive decline beginning yesterday 8/25 per family present. Pt requiring mod - max cuing from interpreter. Pt required increased time to complete all tasks Difficult to assess due to: Hard of hearing/deaf   Blood pressure (!) 153/86, pulse (!) 106, temperature 97.6 F (36.4 C), temperature source Oral, resp. rate 18, height _0  (1.6 m), weight 57.3 kg, SpO2 100 %. Physical Exam  Nursing note and vitals reviewed. Constitutional: She appears well-developed and well-nourished.  HENT:  Head: Normocephalic and atraumatic.  Eyes: Pupils are equal, round, and reactive to light. EOM are normal.  Neck: Normal range of motion. Neck supple.  Cardiovascular: Normal rate. Exam reveals no gallop and no friction rub.  No murmur heard. Respiratory: Effort normal. No respiratory distress. She has no wheezes.  GI: Soft. Bowel sounds are normal. She exhibits no distension. There is no tenderness.  Musculoskeletal:  Healing excoriations on LLE.   Neurological: She is alert.  Non verbal. Makes eye contact and follows commands with delay when cued physically and by  interpreter. RUE 4/5, RLE 3-4/5 but inconsistent. LUE and LLE 4+ to 5/5. Seems to sense pain more on the left than right. Mild right central 7.   Skin:  Well healed scars upper back (excoriations)  Psychiatric:  Pleasant but does not engage except with substantial cues    Results for orders placed or performed during the hospital encounter of 07/14/18 (from the past 48 hour(s))  Glucose, capillary     Status: Abnormal   Collection Time: 07/17/18 12:17 PM  Result Value Ref Range   Glucose-Capillary 132 (H) 70 - 99 mg/dL  Glucose, capillary     Status: Abnormal   Collection Time: 07/17/18  4:34 PM  Result Value Ref Range   Glucose-Capillary 192 (H) 70 - 99 mg/dL  Glucose, capillary     Status: Abnormal   Collection Time: 07/17/18  9:24 PM  Result Value Ref Range   Glucose-Capillary 171 (H) 70 - 99 mg/dL   Comment 1 Notify RN    Comment 2 Document in Chart   CBC with Differential/Platelet     Status: Abnormal   Collection Time: 07/18/18  4:33 AM  Result Value Ref Range   WBC 9.3 4.0 - 10.5 K/uL   RBC 5.19 (  H) 3.87 - 5.11 MIL/uL   Hemoglobin 15.4 (H) 12.0 - 15.0 g/dL   HCT 48.3 (H) 36.0 - 46.0 %   MCV 93.1 78.0 - 100.0 fL   MCH 29.7 26.0 - 34.0 pg   MCHC 31.9 30.0 - 36.0 g/dL   RDW 12.7 11.5 - 15.5 %   Platelets 232 150 - 400 K/uL   Neutrophils Relative % 71 %   Neutro Abs 6.6 1.7 - 7.7 K/uL   Lymphocytes Relative 12 %   Lymphs Abs 1.1 0.7 - 4.0 K/uL   Monocytes Relative 13 %   Monocytes Absolute 1.2 (H) 0.1 - 1.0 K/uL   Eosinophils Relative 3 %   Eosinophils Absolute 0.3 0.0 - 0.7 K/uL   Basophils Relative 1 %   Basophils Absolute 0.1 0.0 - 0.1 K/uL   Immature Granulocytes 0 %   Abs Immature Granulocytes 0.0 0.0 - 0.1 K/uL    Comment: Performed at [facility] [address], [place], [place] [address]  Basic metabolic panel     Status: Abnormal   Collection Time: 07/18/18  4:33 AM  Result Value Ref Range   Sodium 138 135 - 145 mmol/L   Potassium 4.0 3.5 - 5.1  mmol/L   Chloride 104 98 - 111 mmol/L   CO2 25 22 - 32 mmol/L   Glucose, Bld 133 (H) 70 - 99 mg/dL   BUN 12 8 - 23 mg/dL   Creatinine, Ser 0.67 0.44 - 1.00 mg/dL   Calcium 8.2 (L) 8.9 - 10.3 mg/dL   GFR calc non Af Amer >60 >60 mL/min   GFR calc Af Amer >60 >60 mL/min    Comment: (NOTE) The eGFR has been calculated using the CKD EPI equation. This calculation has not been validated in all clinical situations. eGFR's persistently <60 mL/min signify possible Chronic Kidney Disease.    Anion gap 9 5 - 15    Comment: Performed at [facility] [address], [place], [place] [address]  Magnesium     Status: None   Collection Time: 07/18/18  4:33 AM  Result Value Ref Range   Magnesium 1.8 1.7 - 2.4 mg/dL    Comment: Performed at [facility] [address], [place], [place] [address]  Glucose, capillary     Status: Abnormal   Collection Time: 07/18/18  7:20 AM  Result Value Ref Range   Glucose-Capillary 103 (H) 70 - 99 mg/dL   Comment 1 Notify RN    Comment 2 Document in Chart   Glucose, capillary     Status: Abnormal   Collection Time: 07/18/18 10:56 AM  Result Value Ref Range   Glucose-Capillary 283 (H) 70 - 99 mg/dL  CBC     Status: None   Collection Time: 07/19/18  4:00 AM  Result Value Ref Range   WBC 10.3 4.0 - 10.5 K/uL   RBC 4.52 3.87 - 5.11 MIL/uL   Hemoglobin 13.7 12.0 - 15.0 g/dL   HCT 42.4 36.0 - 46.0 %   MCV 93.8 78.0 - 100.0 fL   MCH 30.3 26.0 - 34.0 pg   MCHC 32.3 30.0 - 36.0 g/dL   RDW 13.0 11.5 - 15.5 %   Platelets 233 150 - 400 K/uL    Comment: Performed at [facility], [place]. [address], [place], [place] [address]  Basic metabolic panel     Status: Abnormal   Collection Time: 07/19/18  4:00 AM  Result Value Ref Range  Sodium 139 135 - 145 mmol/L   Potassium 4.0 3.5 - 5.1 mmol/L   Chloride 105 98 - 111 mmol/L   CO2 27 22 - 32 mmol/L   Glucose, Bld 139 (H) 70 - 99 mg/dL   BUN 15 8 - 23 mg/dL   Creatinine, Ser 0.74 0.44 - 1.00 mg/dL     Calcium 8.1 (L) 8.9 - 10.3 mg/dL   GFR calc non Af Amer >60 >60 mL/min   GFR calc Af Amer >60 >60 mL/min    Comment: (NOTE) The eGFR has been calculated using the CKD EPI equation. This calculation has not been validated in all clinical situations. eGFR's persistently <60 mL/min signify possible Chronic Kidney Disease.    Anion gap 7 5 - 15    Comment: Performed at [facility] [address] [place], [place] [address]  Magnesium     Status: None   Collection Time: 07/19/18  4:00 AM  Result Value Ref Range   Magnesium 1.7 1.7 - 2.4 mg/dL    Comment: Performed at [facility] [address], [place], [place] [address]   Mr Brain Wo Contrast  Result Date: 07/18/2018 CLINICAL DATA:  Recurrent right-sided weakness. EXAM: MRI HEAD WITHOUT CONTRAST TECHNIQUE: Multiplanar, multiecho pulse sequences of the brain and surrounding structures were obtained without intravenous contrast. COMPARISON:  Head CT 07/16/2018 and MRI 07/15/2018 FINDINGS: At the request of the attending neurologist, only axial and coronal diffusion imaging was performed. Multiple clustered acute infarcts are again seen in the parasagittal left frontal lobe extending into the corpus callosum. These have increased from the prior MRI, mainly superiorly and posteriorly. Encephalomalacia in the left temporal lobe and left basal ganglia was more fully evaluated on the prior complete brain MRI. IMPRESSION: Increased number of acute distal left ACA infarcts since the 07/15/2018 MRI. Electronically Signed   By: Logan Bores M.D.   On: 07/18/2018 15:12       Medical Problem List and Plan: 1.  Right sided weakness and cognitive deficits  secondary to Left ACA stroke.   -admit to inpatient rehab  -given pt's need for interpreter and autism, we are going to try to keep therapies at a consistent time each day. Family states that she is much better from an activity tolerance standpoint if she has a schedule.  2.  DVT  Prophylaxis/Anticoagulation: Pharmaceutical: Lovenox 3. Pain Management: N/A 4. Mood: Maintain consistent schedule/routine. LCSW to follow for assistance,  5. Neuropsych: This patient is not capable of making decisions on her own behalf. 6. Skin/Wound Care: routine pressure relief measures.  7. Fluids/Electrolytes/Nutrition: Monitor I/O. Check lytes in am.   -encourage PO 8. HTN: Monitor bid --avoid hypotension.  9. Hypothyroid: On supplement.  10. RLS/Anxiety disorder: used Klonopin prn. Currently refusing use.  11. Seizure disorder post ICH: On Keppra bid.    Post Admission Physician Evaluation: 1. Functional deficits secondary  to Left ACA infarct. 2. Patient is admitted to receive collaborative, interdisciplinary care between the physiatrist, rehab nursing staff, and therapy team. 3. Patient's level of medical complexity and substantial therapy needs in context of that medical necessity cannot be provided at a lesser intensity of care such as a SNF. 4. Patient has experienced substantial functional loss from his/her baseline which was documented above under the "Functional History" and "Functional Status" headings.  Judging by the patient's diagnosis, physical exam, and functional history, the patient has potential for functional progress which will result in measurable gains while on inpatient rehab.  These gains will be of substantial and practical use upon discharge  in facilitating mobility and self-care at the household level. 5. Physiatrist will provide 24 hour management of medical needs as well as oversight of the therapy plan/treatment and provide guidance as appropriate regarding the interaction of the two. 6. The Preadmission Screening has been reviewed and patient status is unchanged unless otherwise stated above. 7. 24 hour rehab nursing will assist with bladder management, bowel management, safety, skin/wound care, disease management, medication administration, pain management  and patient education  and help integrate therapy concepts, techniques,education, etc. 8. PT will assess and treat for/with: Lower extremity strength, range of motion, stamina, balance, functional mobility, safety, adaptive techniques and equipment, NMR, family education, ego support.   Goals are: supervision. 9. OT will assess and treat for/with: ADL's, functional mobility, safety, upper extremity strength, adaptive techniques and equipment, NMR, family education.   Goals are: supervision. Therapy may proceed with showering this patient. 10. SLP will assess and treat for/with: cognition, communication, family education.  Goals are: supervision to min assist. 11. Case Management and Social Worker will assess and treat for psychological issues and discharge planning. 12. Team conference will be held weekly to assess progress toward goals and to determine barriers to discharge. 13. Patient will receive at least 3 hours of therapy per day at least 5 days per week. 14. ELOS: 7-10 days       15. Prognosis:  excellent   I have personally performed a face to face diagnostic evaluation of this patient and formulated the key components of the plan.  Additionally, I have personally reviewed laboratory data, imaging studies, as well as relevant notes and concur with the physician assistant's documentation above.  Meredith Staggers, MD, [facility] Drown   Bary Leriche, PA-C 07/19/2018 Dr.

## 2018-07-19 NOTE — Discharge Summary (Signed)
Discharge Summary  Laura [name]:[id number] DOB: 02-01-1941  PCP: Romero Belling, MD  Admit date: 07/14/2018 Discharge date: 07/19/2018  Time spent: , more than 50% time spent on coordination of care. Patient is discharge to CIR  Recommendations for Outpatient Follow-up:  1. F/u with PMD within a week  for hospital discharge follow up, repeat cbc/bmp at follow up 2. F/u with cardiology for 30days event monitor  3. F/u with neurology  Discharge Diagnoses:  Active Hospital Problems   Diagnosis Date Noted  . Acute encephalopathy 07/14/2018  . History of CVA (cerebrovascular accident) without residual deficits   . Sinus tachycardia   . Diabetes mellitus type 2 in nonobese (HCC)   . Autism spectrum disorder 07/14/2018  . Deaf 07/14/2018  . Bradycardia 05/16/2018  . CVA (cerebral vascular accident) (HCC) 04/06/2017  . Hypothyroidism 04/15/2009  . Essential hypertension 05/07/2008  . Abnormal involuntary movement 11/21/2007    Resolved Hospital Problems  No resolved problems to display.    Discharge Condition: stable  Diet recommendation:   Filed Weights   07/14/18 1813  Weight: 57.3 kg    History of present illness: (per admitting provide  Dr Julian Reil) PCP: Romero Belling, MD  Patient coming from: [facility]  I have personally briefly reviewed patient's old medical records in [facility]  Chief Complaint: AMS  HPI: Laura [name] is a 77 y.o. female with medical history significant of autism spectrum disorder, deafness, HTN, ICH in May 2018, hypothyroidism, chronic tremor.  Normally patient lives in independent living facility and is quite functional.  Over the past week or so she has had some gait difficulty, lightheadedness, dizziness without falling.  No syncope.  Confusion.  No pain, no clear precipitating, alleviating, nor exacerbating factors.  No witnessed tonic-clonic seizure like activity though it does seem somewhat episodic by  caregivers descriptions.   ED Course: CBC, BMP, UA, CXR, all negative, CT head just shows chronic encephalomalacia changes.  Of note she is bradycardic with irregular sinus patern.  Bradycardic down into the mid 30s at times while resting.  Orthostatics negative though.  Hospital Course:  Principal Problem:   Acute encephalopathy Active Problems:   Hypothyroidism   Essential hypertension   Abnormal involuntary movement   CVA (cerebral vascular accident) (HCC)   Bradycardia   Autism spectrum disorder   Deaf   History of CVA (cerebrovascular accident) without residual deficits   Sinus tachycardia   Diabetes mellitus type 2 in nonobese (HCC)   Acute CVA/Encephalopathy  -presenting symptom of dizziness, fatigue, gait instability and speech (sign language) patten changes -No source of infection  -eeg negative.  -uds negative -MRI " 1. 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 2. Remote left temporal lobe hemorrhage and left lateral lenticulostriate infarct." -TTE echocardiogram no report of embolic source -carotid US ordered,  -ldl 168 -a1c 6.7 --neurology consulted, she is started on asa 325 and plavix 75daily, continue statin, venous doppler negative for DVt -she has another episode of right sided weakness on 8/25-8/26 night after acutely drop blood pressure from hypertension (sbp 180) to normotensive ( sbp in 110's), repeat mri brain on 8/26 am showed " mild extension of distal left ACA territory infarct. Still within the same vessel distribution" per neurology "It is most likely the stroke extension was caused by abrupt BP decrease last night. Treatment plan remains the same, continue DAPT for 3 months and then plavix alone. Continue statin. Avoid hypotension, aggressive PT/OT. 30 day cardiac event monitoring  as outpt to rule out afib. " -appreciate neurology input -patient is discharged to CIR.  Bradycardia/arrythmia: -presenting symptom of  dizziness, fatigue, increased gait instability, not sure if related to cardiac or neuro -no reported syncope initially, on further question, family reports patient is found down in the elevator prior to coming to the hospital, they are not sure what cause the fall, they did notice patient has increased gait instability recently -patient  has h/o falls with subdural hematoma in the past -bp stable -Keep on tele -Echo lvef wnl -cardiology consulted, recommend outpatient cardiac monitor   HTN:  allow permissive hypertension due to actue cva Gradually lower bp to normal  tsh elevated Patient reports she has not been taking synthroid , restart home does synthroid  H/o subdural hematoma, s/p fall in 03/2017, was started on keppra for seizure prevention in 03/2017  Autism/deaf -from independent living Use sign language   Code Status: full  Family Communication: patient and family at bedside  Disposition Plan: CIR    Consultants:  Neurology  Cardiology  CIR  Procedures:  none  Antibiotics:  none   Discharge Exam: BP (!) 178/100 (BP Location: Right Arm)   Pulse (!) 102   Temp 97.7 F (36.5 C) (Oral)   Resp 18   Ht 5\' 3"  (1.6 m)   Wt 57.3 kg   SpO2 100%   BMI 22.38 kg/m   General: NAD, alert and interactive, use sign language to communicate  Cardiovascular: RRR Respiratory: CTABL  Discharge Instructions You were cared for by a hospitalist during your hospital stay. If you have any questions about your discharge medications or the care you received while you were in the hospital after you are discharged, you can call the unit and asked to speak with the hospitalist on call if the hospitalist that took care of you is not available. Once you are discharged, your primary care physician will handle any further medical issues. Please note that NO REFILLS for any discharge medications will be authorized once you are discharged, as it is imperative that you  return to your primary care physician (or establish a relationship with a primary care physician if you do not have one) for your aftercare needs so that they can reassess your need for medications and monitor your lab values.  Discharge Instructions    Ambulatory referral to Neurology   Complete by:  As directed    An appointment is requested in approximately: 6 weeks   Increase activity slowly   Complete by:  As directed      Allergies as of 07/19/2018   No Known Allergies     Medication List    STOP taking these medications   amLODipine 10 MG tablet Commonly known as:  NORVASC   triamcinolone cream 0.1 % Commonly known as:  KENALOG     TAKE these medications   alendronate 70 MG tablet Commonly known as:  FOSAMAX TAKE 1 TABLET BY MOUTH ONCE A WEEK .  TAKE  WITH  A  FULL  GLASS  OF  WATER  ON  AN  EMPTY  STOMACH What changed:  See the new instructions.   aspirin 325 MG EC tablet Take 1 tablet (325 mg total) by mouth daily. Start taking on:  07/20/2018   atorvastatin 80 MG tablet Commonly known as:  LIPITOR Take 1 tablet (80 mg total) by mouth daily at 6 PM.   cholecalciferol 1000 units tablet Commonly known as:  VITAMIN D Take 1,000 Units by  mouth daily.   clonazePAM 1 MG tablet Commonly known as:  KLONOPIN Take 1 tablet (1 mg total) by mouth 3 (three) times daily as needed (tremor).   clopidogrel 75 MG tablet Commonly known as:  PLAVIX Take 1 tablet (75 mg total) by mouth daily. Start taking on:  07/20/2018   levETIRAcetam 500 MG tablet Commonly known as:  KEPPRA Take 1 tablet (500 mg total) by mouth 2 (two) times daily.   levothyroxine 50 MCG tablet Commonly known as:  SYNTHROID, LEVOTHROID Take 1 tablet (50 mcg total) by mouth daily.   multivitamin tablet Take 1 tablet by mouth daily.      No Known Allergies Follow-up Information    Romero Belling, MD Follow up.   Specialty:  Endocrinology Contact information: [address] [place] [address] [phone]        Marinus Maw, MD Follow up.   Specialty:  Cardiology Why:  The office will call to schedule an event monitor and follow up appt. Contact information: [address] [place] [address] [phone]        [facility] Follow up.   Specialty:  Cardiology Why:  for 30day event monitor per neurology recommendation  Contact information: [address] [place] [address] [phone]       Van Clines, MD. Schedule an appointment as soon as possible for a visit in 6 week(s).   Specialty:  Neurology Contact information: [address] [place] [address] [phone]            The results of significant diagnostics from this hospitalization (including imaging, microbiology, ancillary and laboratory) are listed below for reference.    Significant Diagnostic Studies: Ct Angio Head W Or Wo Contrast  Result Date: 07/16/2018 CLINICAL DATA:  Acute infarctions in the left anterior cerebral artery territory demonstrated by MRI yesterday. EXAM: CT ANGIOGRAPHY HEAD AND NECK TECHNIQUE: Multidetector CT imaging of the head and neck was performed using the standard protocol during bolus administration of intravenous contrast. Multiplanar CT image reconstructions and MIPs were obtained to evaluate the vascular anatomy. Carotid stenosis measurements (when applicable) are obtained utilizing NASCET criteria, using the distal internal carotid diameter as the denominator. CONTRAST:  50mL ISOVUE-370 IOPAMIDOL (ISOVUE-370) INJECTION 76% COMPARISON:  MR and CT examinations done 07/15/2018 and 07/14/2018. FINDINGS: CTA NECK FINDINGS Aortic arch: Ectasia of the aortic after check without dissection. Right carotid system: Common carotid artery is patent to the bifurcation region which is low within the neck. There is atherosclerotic plaque at the carotid bifurcation but  there is no stenosis or irregularity. Cervical ICA is widely patent. Left carotid system: Common carotid artery is widely patent to the bifurcation region. There is atherosclerotic plaque at the carotid bifurcation but no stenosis or irregularity. Cervical ICA is widely patent. Vertebral arteries: There is some atherosclerotic plaque of both proximal subclavian arteries but no flow limiting stenosis. Right vertebral artery is widely patent. Left vertebral artery shows a small calcified plaque with 30% stenosis at the origin. Beyond that, both vertebral arteries are widely patent through the cervical region to the foramen magnum. 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 Upper chest: Normal Review of the MIP images confirms the above findings CTA HEAD FINDINGS Anterior circulation: Both internal carotid arteries are patent through the skull base and siphon regions without stenosis.  The anterior and middle cerebral vessels are patent without proximal stenosis, aneurysm or vascular malformation. The left anterior cerebral artery is occluded in the A2 segment. There is thready reconstitution beyond that initial occlusion. Posterior circulation: Both vertebral arteries are patent to the basilar. No basilar stenosis. Posterior circulation branch vessels are normal. 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 Venous sinuses: Patent and normal. Anatomic variants: None other significant. 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 Delayed phase: No abnormal enhancement. 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 Minimal atherosclerotic disease at both carotid bifurcations but without stenosis or significant irregularity.  Occlusion of the left anterior cerebral artery in the A2 segment with thready reconstitution beyond that. Atherosclerotic disease of both subclavian arteries proximal to the vertebral artery origins, but without flow limiting stenosis. 30% stenosis of the left vertebral artery origin. 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 Electronically Signed   By: Paulina Fusi M.D.   On: 07/16/2018 16:16   Ct Head Wo Contrast  Result Date: 07/14/2018 CLINICAL DATA:  77 year old female with acute ataxia EXAM: CT HEAD WITHOUT CONTRAST TECHNIQUE: Contiguous axial images were obtained from the base of the skull through the vertex without intravenous contrast. COMPARISON:  06/14/2017 FINDINGS: Brain: No evidence of acute infarction, hemorrhage, hydrocephalus, extra-axial collection or mass lesion/mass effect. Mild chronic small-vessel white matter ischemic changes again identified. LEFT temporal and basal ganglia encephalomalacia again noted. Vascular: Carotid atherosclerotic calcifications again identified. Skull: No acute abnormality Sinuses/Orbits: No acute abnormality Other: None IMPRESSION: 1. No evidence of acute intracranial abnormality. 2. Mild chronic small-vessel white matter ischemic changes and LEFT temporal/basal ganglia encephalomalacia. Electronically Signed   By: Harmon Pier M.D.   On: 07/14/2018 19:23   Ct Angio Neck W Or Wo Contrast  Result Date: 07/16/2018 CLINICAL DATA:  Acute infarctions in the left anterior cerebral artery territory demonstrated by MRI yesterday. EXAM: CT ANGIOGRAPHY HEAD AND NECK TECHNIQUE: Multidetector CT imaging of the head and neck was performed using the standard protocol during bolus administration of intravenous contrast. Multiplanar CT image reconstructions and MIPs were obtained to evaluate the vascular anatomy. Carotid stenosis measurements (when applicable) are obtained utilizing NASCET criteria, using the distal internal carotid diameter as  the denominator. CONTRAST:  50mL ISOVUE-370 IOPAMIDOL (ISOVUE-370) INJECTION 76% COMPARISON:  MR and CT examinations done 07/15/2018 and 07/14/2018. FINDINGS: CTA NECK FINDINGS Aortic arch: Ectasia of the aortic after check without dissection. Right carotid system: Common carotid artery is patent to the bifurcation region which is low within the neck. There is atherosclerotic plaque at the carotid bifurcation but there is no stenosis or irregularity. Cervical ICA is widely patent. Left carotid system: Common carotid artery is widely patent to the bifurcation region. There is atherosclerotic plaque at the carotid bifurcation but no stenosis or irregularity. Cervical ICA is widely patent. Vertebral arteries: There is some atherosclerotic plaque of both proximal subclavian arteries but no flow limiting stenosis. Right vertebral artery is widely patent. Left vertebral artery shows a small calcified plaque with 30% stenosis at the origin. Beyond that, both vertebral arteries are widely patent through the cervical region to the foramen magnum. 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 Upper chest: Normal Review of the MIP images confirms the above findings  CTA HEAD FINDINGS Anterior circulation: Both internal carotid arteries are patent through the skull base and siphon regions without stenosis. The anterior and middle cerebral vessels are patent without proximal stenosis, aneurysm or vascular malformation. The left anterior cerebral artery is occluded in the A2 segment. There is thready reconstitution beyond that initial occlusion. Posterior circulation: Both vertebral arteries are patent to the basilar. No basilar stenosis. Posterior circulation branch vessels are normal. 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 Venous sinuses: Patent and normal. Anatomic  variants: None other significant. 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 Delayed phase: No abnormal enhancement. 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 Minimal atherosclerotic disease at both carotid bifurcations but without stenosis or significant irregularity. Occlusion of the left anterior cerebral artery in the A2 segment with thready reconstitution beyond that. Atherosclerotic disease of both subclavian arteries proximal to the vertebral artery origins, but without flow limiting stenosis. 30% stenosis of the left vertebral artery origin. 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 Electronically Signed   By: Paulina FusiMark  Shogry M.D.   On: 07/16/2018 16:16   Mr Brain Wo Contrast  Result Date: 07/18/2018 CLINICAL DATA:  Recurrent right-sided weakness. EXAM: MRI HEAD WITHOUT CONTRAST TECHNIQUE: Multiplanar, multiecho pulse sequences of the brain and surrounding structures were obtained without intravenous contrast. COMPARISON:  Head CT 07/16/2018 and MRI 07/15/2018 FINDINGS: At the request of the attending neurologist, only axial and coronal diffusion imaging was performed. Multiple clustered acute infarcts are again seen in the parasagittal left frontal lobe extending into the corpus callosum. These have increased from the prior MRI, mainly superiorly and posteriorly. Encephalomalacia in the left temporal lobe and left basal ganglia was more fully evaluated on the prior complete brain MRI. IMPRESSION: Increased number of acute distal left ACA infarcts since the 07/15/2018 MRI. Electronically Signed   By: Sebastian AcheAllen  Grady M.D.   On: 07/18/2018 15:12   Mr Brain Wo Contrast  Result Date: 07/15/2018 CLINICAL DATA:  Altered level of consciousness EXAM: MRI HEAD WITHOUT CONTRAST TECHNIQUE: Multiplanar, multiecho pulse sequences of the brain and  surrounding structures were obtained without intravenous contrast. COMPARISON:  Head CT from yesterday.  Brain MRI 09/14/2011 FINDINGS: Brain: Cluster of small foci of restricted diffusion in the parasagittal left frontal lobe extending to the corpus callosum. Diffusion alteration at a remote left lateral lenticulostriate infarct is attributed to mineralization. Encephalomalacia in the left temporal lobe where there was parenchymal hemorrhage in 2018. Ventriculomegaly from volume loss, worse on left. No acute hemorrhage. No hydrocephalus or masslike finding. Vascular: Major flow voids are preserved Skull and upper cervical spine: No evidence of marrow lesion Sinuses/Orbits: Status post endoscopic sinus surgery. Bilateral cataract resection. IMPRESSION: 1. 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 2. Remote left temporal lobe hemorrhage and left lateral lenticulostriate infarct. Electronically Signed   By: Marnee SpringJonathon  Watts M.D.   On: 07/15/2018 15:49   Dg Chest Port 1 View  Result Date: 07/14/2018 CLINICAL DATA:  Altered mental status. EXAM: PORTABLE CHEST 1 VIEW COMPARISON:  04/07/2017 FINDINGS: Lungs are adequately inflated without focal consolidation or effusion. Mild stable cardiomegaly. Remainder of the exam is unchanged. IMPRESSION: No acute cardiopulmonary disease. Mild stable cardiomegaly. Electronically Signed   By: Elberta Fortisaniel  Boyle M.D.   On: 07/14/2018 19:26    Microbiology: Recent Results (from  the past 240 hour(s))  Urine culture     Status: Abnormal   Collection Time: 07/14/18  9:23 PM  Result Value Ref Range Status   Specimen Description URINE, RANDOM  Final   Special Requests unknown Normal  Final   Culture (A)  Final    <10,000 COLONIES/mL INSIGNIFICANT GROWTH Performed at [facility], [address], [place], [place] [address]    Report Status 07/16/2018 FINAL  Final     Labs: Basic Metabolic Panel: Recent Labs  Lab 07/14/18 1842 07/14/18 2350  07/16/18 0341 07/18/18 0433 07/19/18 0400  NA 137  --  139 138 139  K 4.2  --  4.2 4.0 4.0  CL 100  --  105 104 105  CO2 30  --  26 25 27   GLUCOSE 102*  --  122* 133* 139*  BUN 18  --  15 12 15   CREATININE 0.83  --  0.82 0.67 0.74  CALCIUM 8.6*  --  8.4* 8.2* 8.1*  MG  --  1.9  --  1.8 1.7   Liver Function Tests: Recent Labs  Lab 07/14/18 1842  AST 25  ALT 14  ALKPHOS 57  BILITOT 0.6  PROT 6.6  ALBUMIN 3.5   No results for input(s): LIPASE, AMYLASE in the last 168 hours. Recent Labs  Lab 07/14/18 2350  AMMONIA 24   CBC: Recent Labs  Lab 07/14/18 1842 07/18/18 0433 07/19/18 0400  WBC 7.5 9.3 10.3  NEUTROABS 4.7 6.6  --   HGB 14.4 15.4* 13.7  HCT 45.8 48.3* 42.4  MCV 94.2 93.1 93.8  PLT 223 232 233   Cardiac Enzymes: No results for input(s): CKTOTAL, CKMB, CKMBINDEX, TROPONINI in the last 168 hours. BNP: BNP (last 3 results) No results for input(s): BNP in the last 8760 hours.  ProBNP (last 3 results) No results for input(s): PROBNP in the last 8760 hours.  CBG: Recent Labs  Lab 07/17/18 1217 07/17/18 1634 07/17/18 2124 07/18/18 0720 07/18/18 1056  GLUCAP 132* 192* 171* 103* 283*       Signed:  Albertine Grates MD, PhD  [facility] 07/19/2018, 7:41 PM

## 2018-07-20 ENCOUNTER — Inpatient Hospital Stay (HOSPITAL_COMMUNITY): Payer: Self-pay | Admitting: Occupational Therapy

## 2018-07-20 ENCOUNTER — Inpatient Hospital Stay (HOSPITAL_COMMUNITY): Payer: Self-pay

## 2018-07-20 ENCOUNTER — Inpatient Hospital Stay (HOSPITAL_COMMUNITY): Payer: Medicare Other | Admitting: Physical Therapy

## 2018-07-20 DIAGNOSIS — G8191 Hemiplegia, unspecified affecting right dominant side: Secondary | ICD-10-CM

## 2018-07-20 LAB — CBC WITH DIFFERENTIAL/PLATELET
Abs Immature Granulocytes: 0 10*3/uL (ref 0.0–0.1)
BASOS PCT: 1 %
Basophils Absolute: 0.1 10*3/uL (ref 0.0–0.1)
EOS ABS: 0.4 10*3/uL (ref 0.0–0.7)
EOS PCT: 5 %
HCT: 43.8 % (ref 36.0–46.0)
Hemoglobin: 14.2 g/dL (ref 12.0–15.0)
Immature Granulocytes: 0 %
LYMPHS ABS: 1.1 10*3/uL (ref 0.7–4.0)
Lymphocytes Relative: 16 %
MCH: 30.3 pg (ref 26.0–34.0)
MCHC: 32.4 g/dL (ref 30.0–36.0)
MCV: 93.4 fL (ref 78.0–100.0)
Monocytes Absolute: 0.8 10*3/uL (ref 0.1–1.0)
Monocytes Relative: 11 %
NEUTROS PCT: 67 %
Neutro Abs: 4.7 10*3/uL (ref 1.7–7.7)
PLATELETS: 246 10*3/uL (ref 150–400)
RBC: 4.69 MIL/uL (ref 3.87–5.11)
RDW: 12.6 % (ref 11.5–15.5)
WBC: 7.2 10*3/uL (ref 4.0–10.5)

## 2018-07-20 LAB — COMPREHENSIVE METABOLIC PANEL
ALBUMIN: 2.8 g/dL — AB (ref 3.5–5.0)
ALT: 16 U/L (ref 0–44)
ANION GAP: 6 (ref 5–15)
AST: 26 U/L (ref 15–41)
Alkaline Phosphatase: 58 U/L (ref 38–126)
BILIRUBIN TOTAL: 1.6 mg/dL — AB (ref 0.3–1.2)
BUN: 12 mg/dL (ref 8–23)
CHLORIDE: 103 mmol/L (ref 98–111)
CO2: 29 mmol/L (ref 22–32)
Calcium: 8.3 mg/dL — ABNORMAL LOW (ref 8.9–10.3)
Creatinine, Ser: 0.67 mg/dL (ref 0.44–1.00)
GFR calc Af Amer: >60 mL/min (ref >60)
GLUCOSE: 134 mg/dL — AB (ref 70–99)
POTASSIUM: 4.7 mmol/L (ref 3.5–5.1)
Sodium: 138 mmol/L (ref 135–145)
TOTAL PROTEIN: 6.3 g/dL — AB (ref 6.5–8.1)

## 2018-07-20 MED ORDER — ENOXAPARIN SODIUM 40 MG/0.4ML ~~LOC~~ SOLN
40.0000 mg | SUBCUTANEOUS | Status: DC
  Administered 2018-07-20 – 2018-07-25 (×6): 40 mg via SUBCUTANEOUS
  Filled 2018-07-20 (×6): qty 0.4

## 2018-07-20 MED ORDER — PREMIER PROTEIN SHAKE
11.0000 [oz_av] | Freq: Three times a day (TID) | ORAL | Status: DC
  Administered 2018-07-20 – 2018-07-26 (×17): 11 [oz_av] via ORAL
  Filled 2018-07-20 (×19): qty 325.31

## 2018-07-20 MED ORDER — LEVETIRACETAM 500 MG PO TABS
500.0000 mg | ORAL_TABLET | Freq: Two times a day (BID) | ORAL | Status: DC
  Administered 2018-07-20 – 2018-07-26 (×13): 500 mg via ORAL
  Filled 2018-07-20 (×13): qty 1

## 2018-07-20 NOTE — Patient Care Conference (Signed)
Inpatient RehabilitationTeam Conference and Plan of Care Update Date: 07/20/2018   Time: 10:40 am    Patient Name: Laura [name]      Medical Record Number: [id number]  Date of Birth: July 27, 1941 Sex: Female         Room/Bed: 4W19C/4W19C-01 Payor Info: Payor: MEDICARE / Plan: MEDICARE PART A AND B / Product Type: *No Product type* /    Admitting Diagnosis: l ACA CVA  Admit Date/Time:  07/19/2018  2:45 PM Admission Comments: No comment available   Primary Diagnosis:  <principal problem not specified> Principal Problem: <principal problem not specified>  Patient Active Problem List   Diagnosis Date Noted  . 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 History of CVA (cerebrovascular accident) without residual deficits   . Sinus tachycardia   . Diabetes mellitus type 2 in nonobese (HCC)   . Acute encephalopathy 07/14/2018  . Autism spectrum disorder 07/14/2018  . Deaf 07/14/2018  . Bradycardia 05/16/2018  . Cognitive changes 06/03/2017  . Anemia 05/18/2017  . CVA (cerebral vascular accident) (HCC) 04/06/2017  . Bleeding, intracranial (HCC)   . Sepsis (HCC)   . Subdural hematoma (HCC)   . Trauma   . Osteoporosis 03/23/2017  . Menopause 03/25/2016  . Hyperglycemia 08/07/2015  . Contusion of left chest wall 02/05/2014  . Routine general medical examination at a health care facility 01/19/2014  . Left ventricular hypertrophy 07/15/2011  . Encounter for long-term (current) use of other medications 06/29/2011  . 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 TRIGGER FINGER 10/30/2009  . Hypothyroidism 04/15/2009  . ABNORMAL ELECTROCARDIOGRAM 04/15/2009  . RESTLESS LEG SYNDROME 10/01/2008  . HEADACHE 08/27/2008  . TACHYCARDIA 08/27/2008  . Essential hypertension 05/07/2008  . Abnormal involuntary movement 11/21/2007  . ANXIETY STATE, UNSPECIFIED 10/10/2007  . Dyslipidemia 06/22/2007    Expected Discharge Date: Expected Discharge Date:  07/26/18  Team Members Present: Physician leading conference: Dr. Claudette Laws Social Worker Present: Dossie Der, LCSW Nurse Present: Other (comment)(Christine Tomlinson-RN) PT Present: Aleda Grana, PT OT Present: Rosalio Loud, OT SLP Present: Colin Benton, SLP PPS Coordinator present : Tora Duck, RN, CRRN     Current Status/Progress Goal Weekly Team Focus  Medical   deaf and mute, autistic, requires sign language interpreter     initiate rehab program   Bowel/Bladder   Patient has been continent throughout the night. Per report, has periods of incontinence, requires toileting every 2-3hours.   Pt will remain continent with assist of staff.   Continue to toilet patient every 2-3hrs.    Swallow/Nutrition/ Hydration             ADL's   min/steady assist bathing, min assist LB dressing, setup UB dressing, min assist ambulation  Supervision  ADL retraining, RUE NMR, dynamic standing balance   Mobility   min assist overall without AD  supervision with LRAD  NMR, balance, transfers, gait, stair negotiation, endurance, strengthening   Communication             Safety/Cognition/ Behavioral Observations            Pain   Pt has denied pain this shift.   Pt will remain free from pain during hospital stay.   Assess and montor pain level every shift and as needed. Addressed unrelieved pain with MD for further intervention.    Skin                *See Care Plan and progress notes for long and  short-term goals.     Barriers to Discharge  Current Status/Progress Possible Resolutions Date Resolved   Physician    Medical stability     initiating program  see above      Nursing                  PT  Other (comments)     baseilne cognitive deficits           OT                  SLP                SW                Discharge Planning/Teaching Needs:    Returning back to [facility] with 24 hr care unless [facility] can take her. Also pursuing  other ALF. Aware pt will need 24 hr supervision at discharge from CIR     Team Discussion:  Goals supervision level. Currently min assist and being evaluated today in therapies along with sign interpreter. Medically stable per MD  Revisions to Treatment Plan:  New patient beng evaluated today-goals setting    Continued Need for Acute Rehabilitation Level of Care: The patient requires daily medical management by a physician with specialized training in physical medicine and rehabilitation for the following conditions: Daily direction of a multidisciplinary physical rehabilitation program to ensure safe treatment while eliciting the highest outcome that is of practical value to the patient.: Yes Daily medical management of patient stability for increased activity during participation in an intensive rehabilitation regime.: Yes Daily analysis of laboratory values and/or radiology reports with any subsequent need for medication adjustment of medical intervention for : Blood pressure problems;Neurological problems;Other   I attest that I was present, lead the team conference, and concur with the assessment and plan of the team.   Lucy Chrisupree, Nawaf Strange G 07/22/2018, 1:48 PM

## 2018-07-20 NOTE — Progress Notes (Signed)
Portable interpreter used at this time to reintroduce myself, explain morning medication being administered and morning assessment. Pt expressed understanding with the help of the interpreter and complied with nursing care.

## 2018-07-20 NOTE — Progress Notes (Signed)
Patient information reviewed and entered into eRehab system by Shahida Schnackenberg, RN, CRRN, PPS Coordinator.  Information including medical coding and functional independence measure will be reviewed and updated through discharge.     Per nursing patient was given "Data Collection Information Summary for Patients in Inpatient Rehabilitation Facilities with attached "Privacy Act Statement-Health Care Records" upon admission.  

## 2018-07-20 NOTE — Evaluation (Signed)
Speech Language Pathology Assessment and Plan  Patient Details  Name: Laura [name] MRN: [id number] Date of Birth: September 04, 1941  SLP Diagnosis: Cognitive Impairments;Aphasia;Dysphagia(cognitive )  Rehab Potential: Good ELOS: 9/3    Today's Date: 07/20/2018 SLP Individual Time: 1100-1200 SLP Individual Time Calculation (min): 60 min   Problem List:  Patient Active Problem List   Diagnosis Date Noted  . 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 History of CVA (cerebrovascular accident) without residual deficits   . Sinus tachycardia   . Diabetes mellitus type 2 in nonobese (HCC)   . Acute encephalopathy 07/14/2018  . Autism spectrum disorder 07/14/2018  . Deaf 07/14/2018  . Bradycardia 05/16/2018  . Cognitive changes 06/03/2017  . Anemia 05/18/2017  . CVA (cerebral vascular accident) ([place]) 04/06/2017  . Bleeding, intracranial ([place])   . Sepsis ([place])   . Subdural hematoma ([place])   . Trauma   . Osteoporosis 03/23/2017  . Menopause 03/25/2016  . Hyperglycemia 08/07/2015  . Contusion of left chest wall 02/05/2014  . Routine general medical examination at a health care facility 01/19/2014  . Left ventricular hypertrophy 07/15/2011  . Encounter for long-term (current) use of other medications 06/29/2011  . 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 TRIGGER FINGER 10/30/2009  . Hypothyroidism 04/15/2009  . ABNORMAL ELECTROCARDIOGRAM 04/15/2009  . RESTLESS LEG SYNDROME 10/01/2008  . HEADACHE 08/27/2008  . TACHYCARDIA 08/27/2008  . Essential hypertension 05/07/2008  . Abnormal involuntary movement 11/21/2007  . ANXIETY STATE, UNSPECIFIED 10/10/2007  . Dyslipidemia 06/22/2007   Past Medical History:  Past Medical History:  Diagnosis Date  . Anxiety state, unspecified 10/10/2007  . Arthritis   . ASYMPTOMATIC POSTMENOPAUSAL STATUS 04/15/2009  . Cataract    removed both eyes   . Deaf   . Dermatitis   . Headache(784.0) 08/27/2008  .  HYPERLIPIDEMIA 06/22/2007   rx refused  . HYPERTENSION 05/07/2008  . HYPOTHYROIDISM 04/15/2009  . Mild mental retardation   . 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 RESTLESS LEG SYNDROME 10/01/2008  . TACHYCARDIA 08/27/2008  . TREMOR 11/21/2007  . TRIGGER FINGER 10/30/2009   Past Surgical History:  Past Surgical History:  Procedure Laterality Date  . CATARACT EXTRACTION, BILATERAL    . 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 POA reported 2-week history of cognitive and language impairments with waxing and waning of symptoms, confusion, Ms. spelling due to signing as well as occasional staring off activity. CT head was negative for acute stroke. MRI of brain done revealing cluster of small acute infarcts in the distal left PCA territory, ventriculomegaly from left greater than right volume loss remote left temporal lobe hemorrhage and left lateral lenticulostriate infarct. Neurology consulted for input and recommended continuing Keppra twice daily question of compliance prior to admission. 2D echo showed EF of 55 to 60% with no wall abnormality. Bilateral lower extreme Dopplers were negative for DVT  Dr. Lovena Le consulted for input on bradycardia and did not feel the need for PPM as patient without clinical symptoms and mildly prolonged QRS noted on EKG. He recommended avoiding AV nodal blocking agents and follow-up for 2-week monitor on outpatient basis. She did develop right-sided weakness with brief loss of consciousness on 8:26 PM after receiving IV hydralazine for elevated blood pressure--patient has not been taking  meds consistently during admission. Repeat MRI of brain showed increase in number of acute distal left ACA infarcts. Dr Erlinda Hong felt that extension of stroke was due to  abrupt drop in BP and to continue DAPT for 3 months followed by Plavix alone and to avoid hypotension. as due to hypotension and recommended compliance with medications. Therapy ongoing and patient limited by increase in weakness, decline in cognitive/communicative ability, visual impairments and poor safety. CIR recommended due to functional decline.   Pt presents with moderate-severe cognitive impairments, deficits include sustain attention, intellectual/ safety awareness, basic problem solving, short term recall, comprehension and expression of sign language, supported by informal cognitive linguistic assessment. Pt is deaf, communicating via sign language and a person with ASD.Pt was likely min A at baseline from previous CVA in May earlier this year. Pt's swallow is functional with regular textures and thin via straw and cup, however recommends full supervision and items cut into small pieces due to impulsivity. Pt would benefit from skilled ST services in order to maximize functional independence and reduce burden of care requiring 24/hours supervision and continued ST services at next level of care upon returning home.   Skilled Therapeutic Interventions          Skilled ST services focused on cognitive skills. SLP facilitated informal cognitive linguistic assesment listed above and provided education to [facility] and pt.  Pt demonstrated ability to read/comprehend at phrase level, write at phrase level, name common objects, identify common objects in a field of 3, respond to basic yes/no questions and follow 1 step commands with mod A verbal cues.All questioned answered to satisfatcion. Pt was left in room with call bell within reach and bed alaram set.ST reccomends to continue skilled ST services.   SLP Assessment  Patient will need skilled Speech Lanaguage Pathology Services during CIR admission    Recommendations  SLP Diet Recommendations: Thin Liquid Administration via: Straw;Cup Medication  Administration: Whole meds with liquid Supervision: Patient able to self feed;Full supervision/cueing for compensatory strategies Compensations: Minimize environmental distractions;Slow rate;Small sips/bites Postural Changes and/or Swallow Maneuvers: Seated upright 90 degrees Oral Care Recommendations: Oral care BID Patient destination: Home Follow up Recommendations: Home Health SLP;Outpatient SLP;24 hour supervision/assistance;Other (comment)(ALF) Equipment Recommended: None recommended by SLP    SLP Frequency 3 to 5 out of 7 days   SLP Duration  SLP Intensity  SLP Treatment/Interventions 9/3  Minumum of 1-2 x/day, 30 to 90 minutes  Cognitive remediation/compensation;Cueing hierarchy;Dysphagia/aspiration precaution training;Functional tasks;Patient/family education    Pain Pain Assessment Pain Score: 0-No pain  Prior Functioning Cognitive/Linguistic Baseline: Baseline deficits Baseline deficit details: from CVA in May likely Min A  Type of Home: Independent living facility  Lives With: (ILF) Available Help at Discharge: Other (Comment)(d/c to ALF)  Function:  Eating Eating   Modified Consistency Diet: No Eating Assist Level: Supervision or verbal cues;Set up assist for           Cognition Comprehension Comprehension assist level: Understands basic 50 - 74% of the time/ requires cueing 25 - 49% of the time  Expression Expression assistive device: (sign language interpter) Expression assist level: Expresses basic 50 - 74% of the time/requires cueing 25 - 49% of the time. Needs to repeat parts of sentences.  Social Interaction Social Interaction assist level: Interacts appropriately 50 - 74% of the time - May be physically or verbally inappropriate.;Interacts appropriately 25 - 49% of time - Needs frequent redirection.  Problem Solving Problem solving assist level: Solves basic 50 - 74% of the  time/requires cueing 25 - 49% of the time  Memory Memory assist level:  Recognizes or recalls 25 - 49% of the time/requires cueing 50 - 75% of the time   Short Term Goals: Week 1: SLP Short Term Goal 1 (Week 1): STG=LTG due to ELOS  Refer to Care Plan for Long Term Goals  Recommendations for other services: None   Discharge Criteria: Patient will be discharged from SLP if patient refuses treatment 3 consecutive times without medical reason, if treatment goals not met, if there is a change in medical status, if patient makes no progress towards goals or if patient is discharged from hospital.  The above assessment, treatment plan, treatment alternatives and goals were discussed and mutually agreed upon: by patient and by family  Tambra Muller  [facility] 07/20/2018, 6:16 PM

## 2018-07-20 NOTE — Progress Notes (Addendum)
No IV site. Keppra 500 mg IV q 12 hrs changed to Keppra 500 mg PO q 12 hours. Changes explained to pt via interpreter. Pt express understanding via interpreter.

## 2018-07-20 NOTE — Progress Notes (Signed)
BP on Right arm 166/99 (109) HR 109, Left arm 169/102 (122) HR 109. Notified PA, monitor BP at this time d/t hypotensive episode when PRN medication once given per PA. Pt is not symptomatic at this time. Pt resting comfortably with sitter at bedside.

## 2018-07-20 NOTE — Evaluation (Addendum)
Physical Therapy Assessment and Plan  Patient Details  Name: Laura [name] MRN: [id number] Date of Birth: Apr 20, 1941  PT Diagnosis: Abnormality of gait, Coordination disorder, Difficulty walking, Hemiparesis dominant, Impaired cognition and Muscle weakness Rehab Potential: Good ELOS: 1 week   Today's Date: 07/20/2018 PT Individual Time: 9735-3299 PT Individual Time Calculation (min): 70 min    Problem List:  Patient Active Problem List   Diagnosis Date Noted  . 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 History of CVA (cerebrovascular accident) without residual deficits   . Sinus tachycardia   . Diabetes mellitus type 2 in nonobese (HCC)   . Acute encephalopathy 07/14/2018  . Autism spectrum disorder 07/14/2018  . Deaf 07/14/2018  . Bradycardia 05/16/2018  . Cognitive changes 06/03/2017  . Anemia 05/18/2017  . CVA (cerebral vascular accident) ([place]) 04/06/2017  . Bleeding, intracranial ([place])   . Sepsis ([place])   . Subdural hematoma ([place])   . Trauma   . Osteoporosis 03/23/2017  . Menopause 03/25/2016  . Hyperglycemia 08/07/2015  . Contusion of left chest wall 02/05/2014  . Routine general medical examination at a health care facility 01/19/2014  . Left ventricular hypertrophy 07/15/2011  . Encounter for long-term (current) use of other medications 06/29/2011  . 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 TRIGGER FINGER 10/30/2009  . Hypothyroidism 04/15/2009  . ABNORMAL ELECTROCARDIOGRAM 04/15/2009  . RESTLESS LEG SYNDROME 10/01/2008  . HEADACHE 08/27/2008  . TACHYCARDIA 08/27/2008  . Essential hypertension 05/07/2008  . Abnormal involuntary movement 11/21/2007  . ANXIETY STATE, UNSPECIFIED 10/10/2007  . Dyslipidemia 06/22/2007    Past Medical History:  Past Medical History:  Diagnosis Date  . Anxiety state, unspecified 10/10/2007  . Arthritis   . ASYMPTOMATIC POSTMENOPAUSAL STATUS 04/15/2009  . Cataract    removed both eyes   .  Deaf   . Dermatitis   . Headache(784.0) 08/27/2008  . HYPERLIPIDEMIA 06/22/2007   rx refused  . HYPERTENSION 05/07/2008  . HYPOTHYROIDISM 04/15/2009  . Mild mental retardation   . 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 RESTLESS LEG SYNDROME 10/01/2008  . TACHYCARDIA 08/27/2008  . TREMOR 11/21/2007  . TRIGGER FINGER 10/30/2009   Past Surgical History:  Past Surgical History:  Procedure Laterality Date  . CATARACT EXTRACTION, BILATERAL    . COLONOSCOPY      Assessment & Plan Clinical Impression: Patient is a 77 y.o. 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 POA reported 2-week history of cognitive and language impairments with waxing and waning of symptoms, confusion, Ms. spelling due to signing as well as occasional staring off activity. CT head was negative for acute stroke. MRI of brain done revealing cluster of small acute infarcts in the distal left PCA territory, ventriculomegaly from left greater than right volume loss remote left temporal lobe hemorrhage and left lateral lenticulostriate infarct. Neurology consulted for input and recommended continuing Keppra twice daily question of compliance prior to admission. 2D echo showed EF of 55 to 60% with no wall abnormality. Bilateral lower extreme Dopplers were negative for DVT. Dr. Lovena Le consulted for input on bradycardia and did not feel the need for PPM as patient without clinical symptoms and mildly prolonged QRS noted on EKG. He recommended avoiding AV nodal blocking agents and follow-up for 2-week monitor on outpatient basis. She did develop right-sided weakness with brief loss of consciousness on 8:26 PM after receiving  IV hydralazine for elevated blood pressure--patient has not been taking meds consistently during admission. Repeat MRI of brain showed increase in number of acute distal left ACA infarcts. Dr Erlinda Hong  felt that extension of stroke was due to abrupt drop in BP and to continue DAPT for 3 months followed by Plavix alone and to avoid hypotension. as due to hypotension and recommended compliance with medications. Therapy ongoing and patient limited by increase in weakness, decline in cognitive/communicative ability, visual impairments and poor safety. CIR recommended due to functional decline. Patient transferred to CIR on 07/19/2018 .   Patient currently requires min with mobility secondary to muscle weakness, decreased cardiorespiratoy endurance, decreased coordination, decreased visual perceptual skills, decreased attention to right, decreased awareness, decreased problem solving, decreased safety awareness and decreased memory, and decreased standing balance, decreased postural control, hemiplegia and decreased balance strategies.  Prior to hospitalization, patient was ambulating around ILF without AD & without assistance and lived with ([place]) in a Independent living facility home.  Home access is  Level entry.  Patient will benefit from skilled PT intervention to maximize safe functional mobility, minimize fall risk and decrease caregiver burden for planned discharge home with 24 hour supervision.  Anticipate patient will benefit from follow up [place] at discharge.  PT - End of Session Activity Tolerance: Decreased this session Endurance Deficit: Yes Endurance Deficit Description: 2/2 decreased cardiopulmonary endurance PT Assessment Rehab Potential (ACUTE/IP ONLY): Good PT Barriers to Discharge: (baseline cognitive deficits) PT Patient demonstrates impairments in the following area(s): Balance;Behavior;Perception;Endurance;Safety;Motor;Sensory PT Transfers Functional Problem(s): Bed Mobility;Floor;Bed to Chair;Car;Furniture PT Locomotion Functional Problem(s): Ambulation;Stairs;Wheelchair Mobility PT Plan PT Intensity: Minimum of 1-2 x/day ,45 to 90 minutes PT Frequency: 5 out of 7 days PT Duration  Estimated Length of Stay: 1 week PT Treatment/Interventions: Ambulation/gait training;Cognitive remediation/compensation;Discharge planning;DME/adaptive equipment instruction;Functional mobility training;Pain management;Psychosocial support;Splinting/orthotics;Therapeutic Activities;UE/LE Strength taining/ROM;Visual/perceptual remediation/compensation;Wheelchair propulsion/positioning;UE/LE Coordination activities;Therapeutic Exercise;Stair training;Patient/family education;Neuromuscular re-education;Disease management/prevention;Community reintegration;Balance/vestibular training PT Transfers Anticipated Outcome(s): supervision with LRAD PT Locomotion Anticipated Outcome(s): supervision with LRAD PT Recommendation Follow Up Recommendations: Home health PT;24 hour supervision/assistance Patient destination: Assisted Living Equipment Recommended: To be determined  Skilled Therapeutic Intervention Pt received in room with POA Manuela Schwartz), sitter Nira Conn) and professional interpreter present. Discussed pt's PLOF and pt's baseline and current cognitive abilities. Educated Manuela Schwartz on [place], daily therapy schedule, need for staff only to assist with transfers, weekly interdisciplinary team meeting, and other CIR information. Pt completes all mobility tasks at a min assist level overall without AD, including toilet transfers, gait over even surfaces, car transfer to simulated SUV, stair negotiation with B rails, and transfers. Pt with continent void and small BM & performed peri hygiene with cuing to locate toilet paper. Pt also required cuing to locate paper towels to R of sink. Pt appearing winded during session but SpO2 = 93% on room air, HR = 87 bpm. Pt utilized nu-step on level 1 x 10 minutes with all four extremities with task focusing on endurance training and coordination of reciprocal movements. At end of session pt left sitting in recliner with BLE elevated & RN in room.   No c/o pain reported.   PT  Evaluation Precautions/Restrictions Precautions Precautions: Fall Precaution Comments: deaf  Restrictions Weight Bearing Restrictions: No  General Chart Reviewed: Yes Additional Pertinent History: anxiety, B cataract removal, deaf, HLD, autism, HTN, tachycardia, tremor Response to Previous Treatment: Patient with no complaints from previous session. Family/Caregiver Present: Yes  Home Living/Prior Functioning Home Living Pt was living at [facility] prior to admission Available Help  at Discharge: (plans to d/c to ALF) Home Access: Level entry Home Layout: One level  Lives With: (ILF) Prior Function Level of Independence: Independent with gait;Independent with transfers Driving: No Comments: Pt living in independent living facility. Over the past few weeks she has demonstrated cognitive decline per POA. She has a history of falling and staff will find her down in room at times when they notice her absent at breakfast. Had been unsafe with Rollator & POA Manuela Schwartz) reports pt elected not to use AD.  Vision/Perception  Vision - Assessment Additional Comments: (did not locate paper towels on R but unsure if this is due to R inattention or hx of R visual field cut in R eye)   Cognition Overall Cognitive Status: Impaired/Different from baseline(autism at baseline) Arousal/Alertness: Awake/alert Orientation Level: Oriented to person Memory: Impaired Awareness: Impaired Safety/Judgment: Impaired  Sensation Sensation Light Touch: (unable to formally assess 2/2 cognitive deficits) Proprioception: (unable to formally assess 2/2 cognitive deficits) Coordination Gross Motor Movements are Fluid and Coordinated: No Fine Motor Movements are Fluid and Coordinated: No  Motor  Motor Motor - Skilled Clinical Observations: R hemi, generalized weakness   Mobility Bed Mobility Bed Mobility: Supine to Sit;Sit to Supine(regular bed in apartment) Supine to Sit: Supervision/Verbal cueing Sit to  Supine: Supervision/Verbal cueing Transfers Transfers: Stand to Sit;Sit to Stand Sit to Stand: Minimal Assistance - Patient > 75%;Contact Guard/Touching assist Stand to Sit: Contact Guard/Touching assist;Minimal Assistance - Patient > 75%  Locomotion  Gait Ambulation: Yes Gait Assistance: Minimal Assistance - Patient > 75% Gait Distance (Feet): 100 Feet Assistive device: None Gait Gait: Yes Gait Pattern: (decreased weight shifting, decreased step length BLE, decreased stride length, decreased balance) Gait velocity: decreased Stairs / Additional Locomotion Stairs: Yes Stairs Assistance: Minimal Assistance - Patient > 75% Stair Management Technique: Two rails Number of Stairs: 12 Height of Stairs: (6" + 3") Wheelchair Mobility Wheelchair Mobility: No   Trunk/Postural Assessment  Postural Control Postural Control: Deficits on evaluation Righting Reactions: delayed Protective Responses: delayed   Balance Balance Balance Assessed: Yes Dynamic Standing Balance Dynamic Standing - Balance Support: No upper extremity supported Dynamic Standing - Level of Assistance: 4: Min assist Dynamic Standing - Balance Activities: (during gait without AD)  Extremity Assessment  LUE Assessment LUE Assessment: Within Functional Limits RLE Assessment General Strength Comments: grossly 3+/5 as pt able to weight bear LLE Assessment LLE Assessment: Within Functional Limits   See Function Navigator for Current Functional Status.   Refer to Care Plan for Long Term Goals  Recommendations for other services: None   Discharge Criteria: Patient will be discharged from PT if patient refuses treatment 3 consecutive times without medical reason, if treatment goals not met, if there is a change in medical status, if patient makes no progress towards goals or if patient is discharged from hospital.  The above assessment, treatment plan, treatment alternatives and goals were discussed and mutually  agreed upon: by patient and by caregiver (POA Manuela Schwartz)  [name] 07/20/2018, 4:18 PM

## 2018-07-20 NOTE — Evaluation (Signed)
Occupational Therapy Assessment and Plan  Patient Details  Name: Laura [name] MRN: [id number] Date of Birth: [birth year]  OT Diagnosis: cognitive deficits, hemiplegia affecting dominant side and muscle weakness (generalized) Rehab Potential: Rehab Potential (ACUTE ONLY): Good ELOS: 5-7 days   Today's Date: 07/20/2018 OT Individual Time: 7026-3785 OT Individual Time Calculation (min): 61 min     Problem List:  Patient Active Problem List   Diagnosis Date Noted  . 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 History of CVA (cerebrovascular accident) without residual deficits   . Sinus tachycardia   . Diabetes mellitus type 2 in nonobese (HCC)   . Acute encephalopathy 07/14/2018  . Autism spectrum disorder 07/14/2018  . Deaf 07/14/2018  . Bradycardia 05/16/2018  . Cognitive changes 06/03/2017  . Anemia 05/18/2017  . CVA (cerebral vascular accident) ([place]) 04/06/2017  . Bleeding, intracranial ([place])   . Sepsis ([place])   . Subdural hematoma ([place])   . Trauma   . Osteoporosis 03/23/2017  . Menopause 03/25/2016  . Hyperglycemia 08/07/2015  . Contusion of left chest wall 02/05/2014  . Routine general medical examination at a health care facility 01/19/2014  . Left ventricular hypertrophy 07/15/2011  . Encounter for long-term (current) use of other medications 06/29/2011  . 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 TRIGGER FINGER 10/30/2009  . Hypothyroidism 04/15/2009  . ABNORMAL ELECTROCARDIOGRAM 04/15/2009  . RESTLESS LEG SYNDROME 10/01/2008  . HEADACHE 08/27/2008  . TACHYCARDIA 08/27/2008  . Essential hypertension 05/07/2008  . Abnormal involuntary movement 11/21/2007  . ANXIETY STATE, UNSPECIFIED 10/10/2007  . Dyslipidemia 06/22/2007    Past Medical History:  Past Medical History:  Diagnosis Date  . Anxiety state, unspecified 10/10/2007  . Arthritis   . ASYMPTOMATIC POSTMENOPAUSAL STATUS 04/15/2009  . Cataract    removed both eyes   .  Deaf   . Dermatitis   . Headache(784.0) 08/27/2008  . HYPERLIPIDEMIA 06/22/2007   rx refused  . HYPERTENSION 05/07/2008  . HYPOTHYROIDISM 04/15/2009  . Mild mental retardation   . 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 RESTLESS LEG SYNDROME 10/01/2008  . TACHYCARDIA 08/27/2008  . TREMOR 11/21/2007  . TRIGGER FINGER 10/30/2009   Past Surgical History:  Past Surgical History:  Procedure Laterality Date  . CATARACT EXTRACTION, BILATERAL    . COLONOSCOPY      Assessment & Plan Clinical Impression: Patient is a 77 y.o. 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 POA reported 2-week history of cognitive and language impairments with waxing and waning of symptoms, confusion, Ms. spelling due to signing as well as occasional staring off activity. CT head was negative for acute stroke. MRI of brain done revealing cluster of small acute infarcts in the distal left PCA territory, ventriculomegaly from left greater than right volume loss remote left temporal lobe hemorrhage and left lateral lenticulostriate infarct. Neurology consulted for input and recommended continuing Keppra twice daily question of compliance prior to admission. 2D echo showed EF of 55 to 60% with no wall abnormality. Bilateral lower extreme Dopplers were negative for DVT  Dr. Lovena Le consulted for input on bradycardia and did not feel the need for PPM as patient without clinical symptoms and mildly prolonged QRS noted on EKG. He recommended avoiding AV nodal blocking agents and follow-up for 2-week monitor on outpatient basis. She did develop right-sided weakness with brief loss of consciousness on 8:26  PM after receiving IV hydralazine for elevated blood pressure--patient has not been taking meds consistently during admission. Repeat MRI of brain showed increase in number of acute distal left ACA infarcts.  Dr Erlinda Hong felt that extension of stroke was due to abrupt drop in BP and to continue DAPT for 3 months followed by Plavix alone and to avoid hypotension. as due to hypotension and recommended compliance with medications. Therapy ongoing and patient limited by increase in weakness, decline in cognitive/communicative ability, visual impairments and poor safety. CIR recommended due to functional decline.    Patient transferred to CIR on 07/19/2018 .    Patient currently requires min with basic self-care skills secondary to muscle weakness, decreased coordination, decreased attention, decreased awareness, decreased problem solving, decreased safety awareness and decreased memory and decreased standing balance, decreased postural control and hemiplegia.  Prior to hospitalization, patient could complete ADLs with independent .  Patient will benefit from skilled intervention to decrease level of assist with basic self-care skills prior to discharge home with care partner.  Anticipate patient will require 24 hour supervision and follow up home health.  OT - End of Session Activity Tolerance: Tolerates 30+ min activity without fatigue Endurance Deficit: No OT Assessment Rehab Potential (ACUTE ONLY): Good OT Patient demonstrates impairments in the following area(s): Balance;Cognition;Motor;Safety OT Basic ADL's Functional Problem(s): Grooming;Bathing;Dressing;Toileting OT Transfers Functional Problem(s): Toilet;Tub/Shower OT Additional Impairment(s): Fuctional Use of Upper Extremity OT Plan OT Intensity: Minimum of 1-2 x/day, 45 to 90 minutes OT Frequency: 5 out of 7 days OT Duration/Estimated Length of Stay: 5-7 days OT Treatment/Interventions: Balance/vestibular training;Cognitive remediation/compensation;Discharge planning;Disease mangement/prevention;DME/adaptive equipment instruction;Functional mobility training;Neuromuscular re-education;Pain management;Patient/family education;Psychosocial support;Self  Care/advanced ADL retraining;Skin care/wound managment;Therapeutic Activities;Therapeutic Exercise;UE/LE Strength taining/ROM;UE/LE Coordination activities OT Self Feeding Anticipated Outcome(s): Supervision OT Basic Self-Care Anticipated Outcome(s): Supervision OT Toileting Anticipated Outcome(s): Supervision OT Bathroom Transfers Anticipated Outcome(s): Supervision OT Recommendation Patient destination: Home(to independent living) Follow Up Recommendations: 24 hour supervision/assistance;Home health OT Equipment Recommended: To be determined   Skilled Therapeutic Intervention OT eval completed with discussion of rehab process, OT purpose, POC, ELOS, and goals.  ADL assessment completed with bathing and dressing at sit > stand level.  Pt ambulated to room shower with min assist.  Completed toilet transfer and walk-in shower transfers with min assist.  Pt required steady assist when standing for peri hygiene and when completing LB dressing.  Assist to thread RLE through Rt leg hole of underwear and to don Rt shoe.  Pt completed UB dressing in standing even fastening bra.  Pt's POA present throughout session and reports pt is demonstrating impaired memory with inability to recall "as well" as before and noted "sluggish" when signing with RUE.    OT Evaluation Precautions/Restrictions  Precautions Precautions: Fall Pain Pain Assessment Pain Scale: Faces Faces Pain Scale: No hurt Home Living/Prior Functioning Home Living Available Help at Discharge: Friend(s), Available 24 hours/day(POA and friends) Type of Home: Independent living facility Home Layout: One level Bathroom Shower/Tub: Chiropodist: Standard Additional Comments: Previously living in independent living at [facility].  POA reports pt to d/c back to [facility] with 24/7 supervision until they find a bed in an assisted living facility  Lives With: Alone(lived in independent living) IADL  History Homemaking Responsibilities: No Prior Function Level of Independence: Independent with basic ADLs Comments: Pt living in independent living facility. Over the past few weeks she has demonstrated cognitive decline per POA. She has a history of falling and staff will find her down in room  at times when they notice her absent at breakfast. Had been unsafe with Rollator but was using it to get to breakfast. ADL   See Function Navigator Vision Baseline Vision/History: Wears glasses Wears Glasses: At all times Patient Visual Report: No change from baseline Vision Assessment?: Vision impaired- to be further tested in functional context Additional Comments: difficult to assess due to impaired cognition and utilizing interpreter for sign language Cognition Overall Cognitive Status: Impaired/Different from baseline Arousal/Alertness: Awake/alert Orientation Level: Person Memory: Impaired Attention: Sustained Sustained Attention: Appears intact Awareness: Impaired Sensation Sensation Light Touch: Appears Intact Proprioception: Appears Intact Coordination Gross Motor Movements are Fluid and Coordinated: No Fine Motor Movements are Fluid and Coordinated: No  Pt with difficulty following directions due to impaired cognition and use of sign language interpreter Extremity/Trunk Assessment RUE Assessment RUE Assessment: Exceptions to [facility] Passive Range of Motion (PROM) Comments: WFL Active Range of Motion (AROM) Comments: WFL General Strength Comments: grossly 4-/5 LUE Assessment LUE Assessment: Within Functional Limits   See Function Navigator for Current Functional Status.   Refer to Care Plan for Long Term Goals  Recommendations for other services: None    Discharge Criteria: Patient will be discharged from OT if patient refuses treatment 3 consecutive times without medical reason, if treatment goals not met, if there is a change in medical status, if patient makes no progress  towards goals or if patient is discharged from hospital.  The above assessment, treatment plan, treatment alternatives and goals were discussed and mutually agreed upon: by patient and by family  [place] [facility] 07/20/2018, 12:16 PM

## 2018-07-20 NOTE — Care Management Note (Signed)
[facility] Individual Statement of Services  Patient Name:  Laura [name]  Date:  07/20/2018  Welcome to the [facility].  Our goal is to provide you with an individualized program based on your diagnosis and situation, designed to meet your specific needs.  With this comprehensive rehabilitation program, you will be expected to participate in at least 3 hours of rehabilitation therapies Monday-Friday, with modified therapy programming on the weekends.  Your rehabilitation program will include the following services:  Physical Therapy (PT), Occupational Therapy (OT), Speech Therapy (ST), 24 hour per day rehabilitation nursing, Therapeutic Recreaction (TR), Case Management (Social Worker), Rehabilitation Medicine, Nutrition Services and Pharmacy Services  Weekly team conferences will be held on Wednesday to discuss your progress.  Your Social Worker will talk with you frequently to get your input and to update you on team discussions.  Team conferences with you and your family in attendance may also be held.  Expected length of stay: 7 days Overall anticipated outcome: supervision with cueing  Depending on your progress and recovery, your program may change. Your Social Worker will coordinate services and will keep you informed of any changes. Your Social Worker's name and contact numbers are listed  below.  The following services may also be recommended but are not provided by the Inpatient [facility]:    Home Health Rehabiltiation Services  Outpatient Rehabilitation Services    Arrangements will be made to provide these services after discharge if needed.  Arrangements include referral to agencies that provide these services.  Your insurance has been verified to be:  Medic[employer] & [employer] Your primary doctor is:  Romero BellingSean [place]  Pertinent information will be shared with your doctor and your insurance company.  Social Worker:   Dossie DerBecky Aleka Twitty, SW [phone] or ([phone]) [phone]  Information discussed with and copy given to patient by: Lucy Chrisupree, Krithika Tome G, 07/20/2018, 3:33 PM

## 2018-07-20 NOTE — Progress Notes (Signed)
Social Work  Social Work Assessment and Plan  Patient Details  Name: Laura [name] MRN: [id number] Date of Birth: [birth year]  Today's Date: 07/20/2018  Problem List:  Patient Active Problem List   Diagnosis Date Noted  . 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 History of CVA (cerebrovascular accident) without residual deficits   . Sinus tachycardia   . Diabetes mellitus type 2 in nonobese (HCC)   . Acute encephalopathy 07/14/2018  . Autism spectrum disorder 07/14/2018  . Deaf 07/14/2018  . Bradycardia 05/16/2018  . Cognitive changes 06/03/2017  . Anemia 05/18/2017  . CVA (cerebral vascular accident) ([place]) 04/06/2017  . Bleeding, intracranial ([place])   . Sepsis ([place])   . Subdural hematoma ([place])   . Trauma   . Osteoporosis 03/23/2017  . Menopause 03/25/2016  . Hyperglycemia 08/07/2015  . Contusion of left chest wall 02/05/2014  . Routine general medical examination at a health care facility 01/19/2014  . Left ventricular hypertrophy 07/15/2011  . Encounter for long-term (current) use of other medications 06/29/2011  . 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 TRIGGER FINGER 10/30/2009  . Hypothyroidism 04/15/2009  . ABNORMAL ELECTROCARDIOGRAM 04/15/2009  . RESTLESS LEG SYNDROME 10/01/2008  . HEADACHE 08/27/2008  . TACHYCARDIA 08/27/2008  . Essential hypertension 05/07/2008  . Abnormal involuntary movement 11/21/2007  . ANXIETY STATE, UNSPECIFIED 10/10/2007  . Dyslipidemia 06/22/2007   Past Medical History:  Past Medical History:  Diagnosis Date  . Anxiety state, unspecified 10/10/2007  . Arthritis   . ASYMPTOMATIC POSTMENOPAUSAL STATUS 04/15/2009  . Cataract    removed both eyes   . Deaf   . Dermatitis   . Headache(784.0) 08/27/2008  . HYPERLIPIDEMIA 06/22/2007   rx refused  . HYPERTENSION 05/07/2008  . HYPOTHYROIDISM 04/15/2009  . Mild mental retardation   . 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 RESTLESS LEG SYNDROME  10/01/2008  . TACHYCARDIA 08/27/2008  . TREMOR 11/21/2007  . TRIGGER FINGER 10/30/2009   Past Surgical History:  Past Surgical History:  Procedure Laterality Date  . CATARACT EXTRACTION, BILATERAL    . COLONOSCOPY     Social History:  reports that she has never smoked. She has never used smokeless tobacco. She reports that she does not drink alcohol or use drugs.  Family / Support Systems Marital Status: Single Patient Roles: Other (Comment)(Friend and resident [facility]) Other Supports: Carollee Sires [phone] Anticipated Caregiver: Manuela Schwartz and hired sitter who signs and provides companionship Ability/Limitations of Caregiver: POA has set up 24 hr caregivers in case no bed at ALF, currently on the waiting list for [facility] Caregiver Availability: 24/7 Family Dynamics: Has distant cousins but no immediate family members. Her POA is her family who looks out for her and makes arrangements for what she needs.  Social History Preferred language: Sign Language Religion: Christian Cultural Background: No issues Education: Some schooling  Read: Yes Write: Yes Employment Status: Disabled Guardian/Conservator: Laureen Ochs sure her needs are met and she has what is needed.   Abuse/Neglect Abuse/Neglect Assessment Can Be Completed: Yes Physical Abuse: Denies Verbal Abuse: Denies Sexual Abuse: Denies Exploitation of patient/patient's resources: Denies Self-Neglect: Denies  Emotional Status Pt's affect, behavior adn adjustment status: Pt is motivated to get back home and be as independent as can be. She is moving well and and recovering from this stroke. She relies on her POA and appreciates what she does for her. She statess the address where she lives and wants to go back there. Recent  Psychosocial Issues: other health issues were being managed by her PCP Pyschiatric History: History anxiety takes medications she finds them helpful. Will see if would benefit from  seeing neuro-psych while here. Will be a short length stay due to high level Substance Abuse History: No issues  Patient / Family Perceptions, Expectations & Goals Pt/Family understanding of illness & functional limitations: Pt and POA have a good understanding of her stroke and deficits. Both talk with the MD daily and feel they have a good understanding her treatment plan going forward. Have a interpreter here when MD rounds. Premorbid pt/family roles/activities: Friend, church member, etc Anticipated changes in roles/activities/participation: resume Pt/family expectations/goals: Pt states: " Going to [facility] when I leavel here."  [name]-POA states: " I am looking into other ALF and will make sure she has what she needs when she leaves here."  [employer]: Other (Comment)(Resident of [facility]-Independent living) Premorbid Home Care/DME Agencies: Other (Comment) Transportation available at discharge: Facility and [facility] referrals recommended: Support group (specify)  Discharge Planning Living Arrangements: Other (Comment)(Senior Living facility) Support Systems: Friends/neighbors, Other relatives Type of Residence: [place] Name: [place] Resources: [employer], Multimedia programmer (specify)([employer]) Museum/gallery curator Resources: Fish farm manager, Other (Comment)(Trust ) Financial Screen Referred: No Living Expenses: Education officer, community Management: Family Does the patient have any problems obtaining your medications?: No Home Management: facility Patient/Family Preliminary Plans: Return back to [facility] independent with 24 hr care or [facility] on the waiting list three people ahead of her. Or to another ALF which can accommodate her. POA is looking into all of her options. Made aware pt will be a short length of stay due to her high level.  Social Work Anticipated Follow Up Needs:  ALF/[place]  Clinical Impression Pleasant female who will work hard in therapies to get back home and in her own comfort zone. POA aware she will need 24hr supervision upon discharge from CIR. Looking into options of ALF and hiring assist in her apartment until a spot opens up at [place] [facility]. POA has hired a Actuary to be here with her for companionship and to keep her safe in her room while here. Await therapy team evaluations and work on best plan for pt  Elease Hashimoto 07/20/2018, 3:30 PM

## 2018-07-20 NOTE — Plan of Care (Signed)
  Problem: Consults Goal: RH STROKE PATIENT EDUCATION Description See Patient Education module for education specifics  Outcome: Progressing Goal: Nutrition Consult-if indicated Outcome: Progressing   Problem: RH BLADDER ELIMINATION Goal: RH STG MANAGE BLADDER WITH ASSISTANCE Description STG Manage Bladder With mod  Assistance  Outcome: Progressing   Problem: RH SKIN INTEGRITY Goal: RH STG SKIN FREE OF INFECTION/BREAKDOWN Description Patient will have no breakdown of skin at discharge.  Outcome: Progressing   Problem: RH SAFETY Goal: RH STG ADHERE TO SAFETY PRECAUTIONS W/ASSISTANCE/DEVICE Description STG Adhere to Safety Precautions With min  Assistance/Device.  Outcome: Progressing Goal: RH STG DECREASED RISK OF FALL WITH ASSISTANCE Description STG Decreased Risk of Fall With  BJ'sMin Assistance.  Outcome: Progressing   Problem: RH COGNITION-NURSING Goal: RH STG USES MEMORY AIDS/STRATEGIES W/ASSIST TO PROBLEM SOLVE Description STG Uses Memory Aids/Strategies With max Assistance to Problem Solve.  Outcome: Progressing Goal: RH STG ANTICIPATES NEEDS/CALLS FOR ASSIST W/ASSIST/CUES Description STG Anticipates Needs/Calls for Assist With Assistance/Cues. Outcome: Progressing   Problem: RH PAIN MANAGEMENT Goal: RH STG PAIN MANAGED AT OR BELOW PT'S PAIN GOAL Description Less than 2   Outcome: Progressing   Problem: RH KNOWLEDGE DEFICIT Goal: RH STG INCREASE KNOWLEDGE OF HYPERTENSION Description POA will be able to verbalize understanding of hypertension at discharge  Outcome: Progressing Goal: RH STG INCREASE KNOWLEGDE OF HYPERLIPIDEMIA Description POA will be able to verbalize understanding of high cholesterol  Outcome: Progressing Goal: RH STG INCREASE KNOWLEDGE OF STROKE PROPHYLAXIS Description POA will be able to verbalize understanding of signs and symptoms of stroke at discharge.  Outcome: Progressing

## 2018-07-20 NOTE — Progress Notes (Signed)
Subjective/Complaints:  Seen with interpreter and medical POA Per POA (hired by family  To assist pt) pt tends to wander and needs a sitter  Objective: Vital Signs: Blood pressure (!) 140/95, pulse 99, temperature 98.1 F (36.7 C), temperature source Oral, resp. rate 19, height 5' 3"  (1.6 m), weight 59.2 kg, SpO2 98 %. Mr Brain Wo Contrast  Result Date: 07/18/2018 CLINICAL DATA:  Recurrent right-sided weakness. EXAM: MRI HEAD WITHOUT CONTRAST TECHNIQUE: Multiplanar, multiecho pulse sequences of the brain and surrounding structures were obtained without intravenous contrast. COMPARISON:  Head CT 07/16/2018 and MRI 07/15/2018 FINDINGS: At the request of the attending neurologist, only axial and coronal diffusion imaging was performed. Multiple clustered acute infarcts are again seen in the parasagittal left frontal lobe extending into the corpus callosum. These have increased from the prior MRI, mainly superiorly and posteriorly. Encephalomalacia in the left temporal lobe and left basal ganglia was more fully evaluated on the prior complete brain MRI. IMPRESSION: Increased number of acute distal left ACA infarcts since the 07/15/2018 MRI. Electronically Signed   By: Logan Bores M.D.   On: 07/18/2018 15:12   Results for orders placed or performed during the hospital encounter of 07/19/18 (from the past 72 hour(s))  CBC WITH DIFFERENTIAL     Status: None   Collection Time: 07/20/18  4:58 AM  Result Value Ref Range   WBC 7.2 4.0 - 10.5 K/uL   RBC 4.69 3.87 - 5.11 MIL/uL   Hemoglobin 14.2 12.0 - 15.0 g/dL   HCT 43.8 36.0 - 46.0 %   MCV 93.4 78.0 - 100.0 fL   MCH 30.3 26.0 - 34.0 pg   MCHC 32.4 30.0 - 36.0 g/dL   RDW 12.6 11.5 - 15.5 %   Platelets 246 150 - 400 K/uL   Neutrophils Relative % 67 %   Neutro Abs 4.7 1.7 - 7.7 K/uL   Lymphocytes Relative 16 %   Lymphs Abs 1.1 0.7 - 4.0 K/uL   Monocytes Relative 11 %   Monocytes Absolute 0.8 0.1 - 1.0 K/uL   Eosinophils Relative 5 %   Eosinophils Absolute 0.4 0.0 - 0.7 K/uL   Basophils Relative 1 %   Basophils Absolute 0.1 0.0 - 0.1 K/uL   Immature Granulocytes 0 %   Abs Immature Granulocytes 0.0 0.0 - 0.1 K/uL    Comment: Performed at [facility], [address] [place], [place] [address]  Comprehensive metabolic panel     Status: Abnormal   Collection Time: 07/20/18  4:58 AM  Result Value Ref Range   Sodium 138 135 - 145 mmol/L   Potassium 4.7 3.5 - 5.1 mmol/L   Chloride 103 98 - 111 mmol/L   CO2 29 22 - 32 mmol/L   Glucose, Bld 134 (H) 70 - 99 mg/dL   BUN 12 8 - 23 mg/dL   Creatinine, Ser 0.67 0.44 - 1.00 mg/dL   Calcium 8.3 (L) 8.9 - 10.3 mg/dL   Total Protein 6.3 (L) 6.5 - 8.1 g/dL   Albumin 2.8 (L) 3.5 - 5.0 g/dL   AST 26 15 - 41 U/L   ALT 16 0 - 44 U/L   Alkaline Phosphatase 58 38 - 126 U/L   Total Bilirubin 1.6 (H) 0.3 - 1.2 mg/dL   GFR calc non Af Amer >60 >60 mL/min   GFR calc Af Amer >60 >60 mL/min    Comment: (NOTE) The eGFR has been calculated using the CKD EPI equation. This calculation has not been validated  in all clinical situations. eGFR's persistently <60 mL/min signify possible Chronic Kidney Disease.    Anion gap 6 5 - 15    Comment: Performed at [facility] [address] [place], [place] [address]     HEENT: normal Cardio: RRR and no murmur Resp: CTA B/L and unlabored GI: BS positive and NT,ND Extremity:  No Edema Skin:   Intact Neuro: Abnormal Sensory cannot assess secondary to cognitive deficits and communication deficits, Abnormal Motor 4/5 RUE, 5/5 LUE and BLE and Abnormal FMC Ataxic/ dec FMC Musc/Skel:  Other no pain with UE and LE ROM, no pain with neckl movement Gen NAD, appears calm and cooperative Oriented to person and hospital  Assessment/Plan: 1. 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 Physiatrist is providing close team supervision and  24 hour management of active medical problems listed below. Physiatrist and rehab team continue to assess barriers to discharge/monitor patient progress toward functional and medical goals. FIM:                                  Medical Problem List and Plan:  1. Right sided weakness and cognitive deficits secondary to Left ACA stroke.  -CIR evals today -given pt's need for interpreter and autism, we are going to try to keep therapies at a consistent time each day. Family states that she is much better from an activity tolerance standpoint if she has a schedule.  2. DVT Prophylaxis/Anticoagulation: Pharmaceutical: Lovenox  3. Pain Management: N/A  4. Mood: Maintain consistent schedule/routine. LCSW to follow for assistance,  5. Neuropsych: This patient is not capable of making decisions on her own behalf.  6. Skin/Wound Care: routine pressure relief measures.  7. Fluids/Electrolytes/Nutrition: Monitor I/O. Check lytes in am.   I 246m- will encourage 8. HTN: Monitor bid --avoid hypotension.  Vitals:   07/19/18 1956 07/20/18 0353  BP: (!) 158/96 (!) 140/95  Pulse: (!) 104 99  Resp: 18 19  Temp: 97.8 F (36.6 C) 98.1 F (36.7 C)  SpO2: 97% 98%  permissive for now, had extention due to low BP, may need to remain in the 140-160 range for another 1-2 wks 9. Hypothyroid: On supplement.  10. RLS/Anxiety disorder: used Klonopin prn. Currently refusing use.  11. Seizure disorder post ICH: On Keppra bid.  LOS (Days) 1 A FACE TO FACE EVALUATION WAS PERFORMED  ACharlett [name]8/28/2019, 9:19 AM

## 2018-07-21 ENCOUNTER — Inpatient Hospital Stay (HOSPITAL_COMMUNITY): Payer: Self-pay | Admitting: Speech Pathology

## 2018-07-21 ENCOUNTER — Inpatient Hospital Stay (HOSPITAL_COMMUNITY): Payer: Self-pay | Admitting: Occupational Therapy

## 2018-07-21 ENCOUNTER — Inpatient Hospital Stay (HOSPITAL_COMMUNITY): Payer: Self-pay | Admitting: Physical Therapy

## 2018-07-21 MED ORDER — ACETAMINOPHEN 325 MG PO TABS: 325.0000 mg | ORAL_TABLET | ORAL | Status: AC | PRN

## 2018-07-21 NOTE — Progress Notes (Signed)
A meeting with the employer of  patient's personal sitters is recommended. Personal sitters are under the impression they are to aide patient with ADL's prior to being checked off with PT or OT. Sitter today grew angry and short tempered with staff when asked to call out for patient care needs. Sitter also refused for patient to use the virtual ASL interpreter because her employer does not want the patient to use such device. It was explained to sitter the virtual ASL interpreter was used in between the times of therapy when a live person isn't available. She expressed understanding however still refused the virtual ASL interpreter. Prior to today pt successfully used the virtual ASL interpreter.

## 2018-07-21 NOTE — Progress Notes (Signed)
Subjective/Complaints:  Interpreter/POA at bedside.  Patient denies any pains.  Patient is looking forward to discharge to her assisted living facility next week. Review of systems denies pain, breathing issues, problems with her stomach.  Objective: Vital Signs: Blood pressure (!) 153/91, pulse 87, temperature 97.9 F (36.6 C), resp. rate 17, height 5' 3"  (1.6 m), weight 59.2 kg, SpO2 100 %. No results found. Results for orders placed or performed during the hospital encounter of 07/19/18 (from the past 72 hour(s))  CBC WITH DIFFERENTIAL     Status: None   Collection Time: 07/20/18  4:58 AM  Result Value Ref Range   WBC 7.2 4.0 - 10.5 K/uL   RBC 4.69 3.87 - 5.11 MIL/uL   Hemoglobin 14.2 12.0 - 15.0 g/dL   HCT 43.8 36.0 - 46.0 %   MCV 93.4 78.0 - 100.0 fL   MCH 30.3 26.0 - 34.0 pg   MCHC 32.4 30.0 - 36.0 g/dL   RDW 12.6 11.5 - 15.5 %   Platelets 246 150 - 400 K/uL   Neutrophils Relative % 67 %   Neutro Abs 4.7 1.7 - 7.7 K/uL   Lymphocytes Relative 16 %   Lymphs Abs 1.1 0.7 - 4.0 K/uL   Monocytes Relative 11 %   Monocytes Absolute 0.8 0.1 - 1.0 K/uL   Eosinophils Relative 5 %   Eosinophils Absolute 0.4 0.0 - 0.7 K/uL   Basophils Relative 1 %   Basophils Absolute 0.1 0.0 - 0.1 K/uL   Immature Granulocytes 0 %   Abs Immature Granulocytes 0.0 0.0 - 0.1 K/uL    Comment: Performed at [facility], [address] [place], [place] [address]  Comprehensive metabolic panel     Status: Abnormal   Collection Time: 07/20/18  4:58 AM  Result Value Ref Range   Sodium 138 135 - 145 mmol/L   Potassium 4.7 3.5 - 5.1 mmol/L   Chloride 103 98 - 111 mmol/L   CO2 29 22 - 32 mmol/L   Glucose, Bld 134 (H) 70 - 99 mg/dL   BUN 12 8 - 23 mg/dL   Creatinine, Ser 0.67 0.44 - 1.00 mg/dL   Calcium 8.3 (L) 8.9 - 10.3 mg/dL   Total Protein 6.3 (L) 6.5 - 8.1 g/dL   Albumin 2.8 (L) 3.5 - 5.0 g/dL   AST 26 15 - 41 U/L   ALT 16 0 - 44 U/L   Alkaline Phosphatase 58 38 - 126 U/L   Total  Bilirubin 1.6 (H) 0.3 - 1.2 mg/dL   GFR calc non Af Amer >60 >60 mL/min   GFR calc Af Amer >60 >60 mL/min    Comment: (NOTE) The eGFR has been calculated using the CKD EPI equation. This calculation has not been validated in all clinical situations. eGFR's persistently <60 mL/min signify possible Chronic Kidney Disease.    Anion gap 6 5 - 15    Comment: Performed at [facility] [address] [place], [place] [address]     HEENT: normal Cardio: RRR and no murmur Resp: CTA B/L and unlabored GI: BS positive and NT,ND Extremity:  No Edema Skin:   Intact Neuro: Abnormal Sensory cannot assess secondary to cognitive deficits and communication deficits, Abnormal Motor 5/5 RUE, 5/5 LUE and BLE and Abnormal FMC Ataxic/ dec FMC Musc/Skel:  Other no pain with UE and LE ROM, no pain with neckl movement Gen NAD, appears calm and cooperative Oriented to person and hospital  Assessment/Plan: 1. Functional deficits secondary  to Left ACA infarct which require 3+ hours per day of interdisciplinary therapy in a comprehensive inpatient rehab setting. Physiatrist is providing close team supervision and 24 hour management of active medical problems listed below. Physiatrist and rehab team continue to assess barriers to discharge/monitor patient progress toward functional and medical goals. FIM: Function - Bathing Position: Shower Body parts bathed by patient: Right arm, Left arm, Chest, Abdomen, Front perineal area, Buttocks, Right upper leg, Left upper leg, Right lower leg, Left lower leg, Back Assist Level: Touching or steadying assistance(Pt > 75%)  Function- Upper Body Dressing/Undressing What is the patient wearing?: Bra, Pull over shirt/dress Bra - Perfomed by patient: Thread/unthread right bra strap, Thread/unthread left bra strap, Hook/unhook bra (pull down sports bra) Pull over shirt/dress - Perfomed by patient: Thread/unthread right sleeve, Thread/unthread left sleeve, Put head  through opening, Pull shirt over trunk Assist Level: Touching or steadying assistance(Pt > 75%) Function - Lower Body Dressing/Undressing What is the patient wearing?: Underwear, Pants, Socks, Shoes Position: (recliner) Underwear - Performed by patient: Thread/unthread left underwear leg, Pull underwear up/down Underwear - Performed by helper: Thread/unthread right underwear leg Pants- Performed by patient: Thread/unthread right pants leg, Thread/unthread left pants leg, Pull pants up/down Socks - Performed by patient: Don/doff right sock, Don/doff left sock Shoes - Performed by patient: Don/doff left shoe Shoes - Performed by helper: Don/doff right shoe Assist for footwear: Partial/moderate assist Assist for lower body dressing: Touching or steadying assistance (Pt > 75%)  Function - Toileting Toileting steps completed by patient: Adjust clothing prior to toileting, Performs perineal hygiene, Adjust clothing after toileting Assist level: Touching or steadying assistance (Pt.75%)  Function - Air cabin crew transfer assistive device: Grab bar Assist level to toilet: Touching or steadying assistance (Pt > 75%) Assist level from toilet: Touching or steadying assistance (Pt > 75%)  Function - Chair/bed transfer Chair/bed transfer method: Ambulatory Chair/bed transfer assist level: Touching or steadying assistance (Pt > 75%)  Function - Locomotion: Wheelchair Will patient use wheelchair at discharge?: No Wheelchair activity did not occur: Safety/medical concerns Wheel 50 feet with 2 turns activity did not occur: Safety/medical concerns Wheel 150 feet activity did not occur: Safety/medical concerns Function - Locomotion: Ambulation Assistive device: No device Max distance: 100 ft Assist level: Touching or steadying assistance (Pt > 75%) Assist level: Touching or steadying assistance (Pt > 75%) Assist level: Touching or steadying assistance (Pt > 75%) Walk 150 feet activity  did not occur: Safety/medical concerns Walk 10 feet on uneven surfaces activity did not occur: Safety/medical concerns  Function - Comprehension Comprehension: Auditory Comprehension assistive device: (sign language) Comprehension assist level: Understands basic 50 - 74% of the time/ requires cueing 25 - 49% of the time  Function - Expression Expression: Verbal Expression assistive device: (sign language interpter) Expression assist level: Expresses basic 50 - 74% of the time/requires cueing 25 - 49% of the time. Needs to repeat parts of sentences.  Function - Social Interaction Social Interaction assist level: Interacts appropriately 50 - 74% of the time - May be physically or verbally inappropriate., Interacts appropriately 25 - 49% of time - Needs frequent redirection.  Function - Problem Solving Problem solving assist level: Solves basic 50 - 74% of the time/requires cueing 25 - 49% of the time  Function - Memory Memory assist level: Recognizes or recalls 25 - 49% of the time/requires cueing 50 - 75% of the time  Medical Problem List and Plan:  1. Right sided weakness and cognitive deficits secondary to Left  ACA stroke.  -CIR PT, OT, SLP-per power of attorney patient is having problems remembering the name and address of her living facility.  At baseline she was able to state these. -given pt's need for interpreter and autism, we are going to try to keep therapies at a consistent time each day. Family states that she is much better from an activity tolerance standpoint if she has a schedule.  2. DVT Prophylaxis/Anticoagulation: Pharmaceutical: Lovenox  3. Pain Management: N/A  4. Mood: Maintain consistent schedule/routine. LCSW to follow for assistance,  5. Neuropsych: This patient is not capable of making decisions on her own behalf.  6. Skin/Wound Care: routine pressure relief measures.  7. Fluids/Electrolytes/Nutrition: Monitor I/O.   I 969m improved, BMET normal 8/29  8.  HTN: Monitor bid --avoid hypotension.  Vitals:   07/20/18 1940 07/21/18 0443  BP: (!) 157/87 (!) 153/91  Pulse: 97 87  Resp: 17 17  Temp: 98.6 F (37 C) 97.9 F (36.6 C)  SpO2: 98% 100%  permissive for now, had extention due to low BP, may need to remain in the 140-160 range for another 1-2 wks 9. Hypothyroid: On supplement.  10. RLS/Anxiety disorder: used Klonopin prn. Currently refusing use.  11. Seizure disorder post ICH: On Keppra bid.  LOS (Days) 2 A FACE TO FACE EVALUATION WAS PERFORMED  ACharlett [name]8/29/2019, 8:51 AM

## 2018-07-21 NOTE — Progress Notes (Signed)
Speech Language Pathology Discharge Summary  Patient Details  Name: Laura [name] MRN: [id number] Date of Birth: 1941-09-11  Today's Date: 07/21/2018   Skilled treatment session #1 SLP Individual Time: 1100-1200 SLP Individual Time Calculation (min): 60 min   Skilled treatment session #2 SLP Individual Time: 5643-3295 SLP Individual Time Calculation (min): 30 min   Skilled Therapeutic Interventions:  Skilled treatment session focused on further diagnostic assessment of pt's overall cognitive and communicative ability within diagnoses of deafness and ASD. Formal ASL interpreter (provided by [facility]) and community deaf interpreter (pt's caregiver) present. Of note, there are many differences between formal ASL that is provided by the medical interpreter and sign language used by members of the deaf community (i.e., paid caregiver). At baseline, pt uses very basic signs to convey information. This is due to autism and lack of formal training for use of ASL. Laura [name]'s life was difficult as her family didn't understand autism or deafness which led to isolation and lack of intense formal training in ASL. As such there were times that pt didn't appear to understand interpreter's strict signing of my instructions but pt was able to understand deaf caregiver's authenic signing of message. Pt's answers were largely correct with some tangential information provided (baseline d/t pt's autism). Pt does demonstrate some perseverative signing and perhaps loss of train of thought during tangential comments. Although she is aware of perseverative signing she benefits from structured redirection.  Given pt's preference for structure/routine and basic use of signs, questions and directions were best communicated in simplistic/structured way. At baseline, pt has a set routine of topics that she discusses with POA and after these topics, she isn't interested in further conversation or interaction. Therefore pt may appear  distracted or appear to lack understanding to information being given to her. But in reality pt is driven by routine based interactions. Additionally, at baseline pt is very basic with her problem solving. She is not able to infer or draw conclusions. Therefore she is able to state that she is in the hospital but at baseline would not be able to state right leg weakness or demonstrate anticipatory awareness of deficits. She is able to state that she is here to "get stronger." Cognitively, pt was able to sequence 3 picture cards, replicate peg design task and count/add simple money. She was not able to make change with money but reports that she uses a debit care. All of the above information is characteristic of pt's baseline abilities in the setting of deafness as well as autism.  POA does describe some transient deficits that occured for several weeks prior to this admission that might be indicative of TIAs. In light of these transient episodes and pt's right leg weakness, would recommend 24 hour supervision at discharge to decrease fall risk. All of the above information was confirmed by pt, caregiver and pt's POA. SLP consulted interdisciplinary team members who confirm and support above description. OT notes that pt with weakness in right hand but is able to form correct finger spellings (althought the formation isn't as precise as previous - it is still recognizable).    Patient has met 8 of 8 long term goals.  Patient to discharge at overall supervision level d/t right leg weakness and POA report of transient episodes.   Clinical Impression/Discharge Summary:   Pt is being discharged from skilled ST services as there are no identifible acute deficits related to CVA. Pt doesn't display a moderate to severe cognitive impairment but is best characterized  by a person who lives within the challenges of autism and deafness.      Type of Caregiver Assistance: Physical;Cognitive  Recommendation:  None       Equipment:     Reasons for discharge: Treatment goals met   Patient/Family Agrees with Progress Made and Goals Achieved: Yes(POA consulted)   Function:    Cognition Comprehension Comprehension assist level: Understands basic 75 - 89% of the time/ requires cueing 10 - 24% of the time(With simple basic sign language (d/t autism))  Expression Expression assistive device: (ASL/sign language) Expression assist level: Expresses basic 75 - 89% of the time/requires cueing 10 - 24% of the time. Needs helper to occlude trach/needs to repeat words.;Expresses basic 90% of the time/requires cueing < 10% of the time.(Via basic sign language)  Social Interaction Social Interaction assist level: Interacts appropriately 25 - 49% of time - Needs frequent redirection.;Interacts appropriately 50 - 74% of the time - May be physically or verbally inappropriate.  Problem Solving Problem solving assist level: Solves basic 75 - 89% of the time/requires cueing 10 - 24% of the time  Memory Memory assist level: Recognizes or recalls 25 - 49% of the time/requires cueing 50 - 75% of the time   Laura [name] 07/21/2018, 3:21 PM

## 2018-07-21 NOTE — Plan of Care (Signed)
  Problem: Consults Goal: RH STROKE PATIENT EDUCATION Description See Patient Education module for education specifics  Outcome: Progressing Goal: Nutrition Consult-if indicated Outcome: Progressing   Problem: RH BLADDER ELIMINATION Goal: RH STG MANAGE BLADDER WITH ASSISTANCE Description STG Manage Bladder With mod  Assistance  Outcome: Progressing   Problem: RH SKIN INTEGRITY Goal: RH STG SKIN FREE OF INFECTION/BREAKDOWN Description Patient will have no breakdown of skin at discharge.  Outcome: Progressing   Problem: RH SAFETY Goal: RH STG ADHERE TO SAFETY PRECAUTIONS W/ASSISTANCE/DEVICE Description STG Adhere to Safety Precautions With min  Assistance/Device.  Outcome: Progressing Goal: RH STG DECREASED RISK OF FALL WITH ASSISTANCE Description STG Decreased Risk of Fall With  Min Assistance.  Outcome: Progressing   Problem: RH COGNITION-NURSING Goal: RH STG USES MEMORY AIDS/STRATEGIES W/ASSIST TO PROBLEM SOLVE Description STG Uses Memory Aids/Strategies With max Assistance to Problem Solve.  Outcome: Progressing Goal: RH STG ANTICIPATES NEEDS/CALLS FOR ASSIST W/ASSIST/CUES Description STG Anticipates Needs/Calls for Assist With Assistance/Cues. Outcome: Progressing   Problem: RH PAIN MANAGEMENT Goal: RH STG PAIN MANAGED AT OR BELOW PT'S PAIN GOAL Description Less than 2   Outcome: Progressing   Problem: RH KNOWLEDGE DEFICIT Goal: RH STG INCREASE KNOWLEDGE OF HYPERTENSION Description POA will be able to verbalize understanding of hypertension at discharge  Outcome: Progressing Goal: RH STG INCREASE KNOWLEGDE OF HYPERLIPIDEMIA Description POA will be able to verbalize understanding of high cholesterol  Outcome: Progressing Goal: RH STG INCREASE KNOWLEDGE OF STROKE PROPHYLAXIS Description POA will be able to verbalize understanding of signs and symptoms of stroke at discharge.  Outcome: Progressing   

## 2018-07-21 NOTE — Progress Notes (Signed)
Social Work Patient ID: Laura ShortsNancy M [name], female   DOB: February 06, 1941, 77 y.o.   MRN: [id number]  Spoke with [name]-POA to discuss the need for her hired sitter to be checked off before transferring pt and ambulating with her in the room. She understood and wil have her stay tomorrow at 9:00 with OT so she can be checked off. Sarah-OT aware of plan tomorrow. Informed her team feels pt will be ready for discharge on Tuesday 9/3. But will require 24 hr supervision for safety. She is aware and has arranged for this. She feels pt staying in her own apartment will be more comfortable for her and make her happy. She has arranged for sitters to be there with her. Will arrange follow up therapies and any equipment needed. Work toward discharge next Tuesday

## 2018-07-21 NOTE — Progress Notes (Signed)
Physical Therapy Session Note  Patient Details  Name: Laura [name] M Benassi MRN: [id number] Date of Birth: [birth year]  Today's Date: 07/21/2018 PT Individual Time: 1300-1415 PT Individual Time Calculation (min): 75 min   Short Term Goals: Week 1:  PT Short Term Goal 1 (Week 1): STG = LTG due to short ELOS.  Skilled Therapeutic Interventions/Progress Updates:    Pt received standing up from recliner in room without assist, sitter and ASL interpreter present and communicated to pt that she needs to wait for assistance to safely stand. Pt's sitter has to leave at beginning of therapy session but educated POA that sitter must complete transfer training with therapy in order to safely assist pt with future transfers. Pt has no complaints of pain. Toilet transfer with min A, pt is SBA for 3/3 toileting steps. Ambulation 3 x 150 ft with no AD and min A, tactile cues to attend to R visual field due to deficits and to avoid obstacles. Pt exhibits some path deviation with gait which is new with this CVA per POA. Nustep level 3 x 15 min with B UE/LE for global endurance. Pt appears SOA with Nustep, SpO2 99% on room air. Standing balance: ball toss with focus on reaching outside BOS. Seated ball toss and ball kick for UE/LE coordination and focus on attending to task. Pt left seated in recliner in room with needs in reach and POA present.  Therapy Documentation Precautions:  Precautions Precautions: Fall Precaution Comments: deaf  Restrictions Weight Bearing Restrictions: No  See Function Navigator for Current Functional Status.   Therapy/Group: Individual Therapy  Peter Congoaylor Roslyn Else, PT, DPT  07/21/2018, 4:20 PM

## 2018-07-21 NOTE — Progress Notes (Signed)
Occupational Therapy Session Note  Patient Details  Name: Laura [name] M Woerner MRN: [id number] Date of Birth: Feb 20, 1941  Today's Date: 07/21/2018 OT Individual Time: 0905-1003 OT Individual Time Calculation (min): 58 min    Short Term Goals: Week 1:  OT Short Term Goal 1 (Week 1): STG = LTGs due to ELOS  Skilled Therapeutic Interventions/Progress Updates:    Treatment session with focus on ADL retraining and increased independence with LB dressing.  Interpreter present throughout session.  Pt received supine in bed reporting need to toilet.  Ambulated to bathroom with min assist and min cues to avoid obstacles and pt impulsively walking to bathroom.  Pt completed toileting tasks with min guard.  Bathing completed at sit > stand level in room shower with supervision, min cues to sit to wash lower legs.  During LB dressing, pt attempted to don pants in standing, min cues to sit to don pants for increased safety.  Educated on hemi-dressing technique however pt still thread LLE first either due to routine/habit or not understanding cue.  During session pt perseverative with words during communication and getting confused when adamantly asking for a person who was not present in room.  Pt required assistance with donning socks and shoes this session due to choosing to sit on BSC vs bed or recliner and seat height too tall for pt increasing difficulty with LB dressing.  Pt completed grooming tasks in standing with supervision.  Pt left upright in w/c (pt preference) with interpreter and hired sitter present.  Therapy Documentation Precautions:  Precautions Precautions: Fall Precaution Comments: deaf  Restrictions Weight Bearing Restrictions: No Pain:  Pt with no c/o pain  See Function Navigator for Current Functional Status.   Therapy/Group: Individual Therapy  Rosalio LoudHOXIE, Timothea Bodenheimer 07/21/2018, 10:50 AM

## 2018-07-22 ENCOUNTER — Inpatient Hospital Stay (HOSPITAL_COMMUNITY): Payer: Self-pay | Admitting: Speech Pathology

## 2018-07-22 ENCOUNTER — Inpatient Hospital Stay (HOSPITAL_COMMUNITY): Payer: Self-pay | Admitting: Occupational Therapy

## 2018-07-22 ENCOUNTER — Inpatient Hospital Stay (HOSPITAL_COMMUNITY): Payer: Medicare Other | Admitting: Physical Therapy

## 2018-07-22 NOTE — Progress Notes (Addendum)
Subjective/Complaints:  No issues overnite sitter and sign language interpreter at bedside  Review of systems denies pain, breathing issues, problems with her stomach.  Objective: Vital Signs: Blood pressure (!) 153/93, pulse (!) 101, temperature 98 F (36.7 C), temperature source Oral, resp. rate 15, height 5' 3"  (1.6 m), weight 59.2 kg, SpO2 98 %. No results found. Results for orders placed or performed during the hospital encounter of 07/19/18 (from the past 72 hour(s))  CBC WITH DIFFERENTIAL     Status: None   Collection Time: 07/20/18  4:58 AM  Result Value Ref Range   WBC 7.2 4.0 - 10.5 K/uL   RBC 4.69 3.87 - 5.11 MIL/uL   Hemoglobin 14.2 12.0 - 15.0 g/dL   HCT 43.8 36.0 - 46.0 %   MCV 93.4 78.0 - 100.0 fL   MCH 30.3 26.0 - 34.0 pg   MCHC 32.4 30.0 - 36.0 g/dL   RDW 12.6 11.5 - 15.5 %   Platelets 246 150 - 400 K/uL   Neutrophils Relative % 67 %   Neutro Abs 4.7 1.7 - 7.7 K/uL   Lymphocytes Relative 16 %   Lymphs Abs 1.1 0.7 - 4.0 K/uL   Monocytes Relative 11 %   Monocytes Absolute 0.8 0.1 - 1.0 K/uL   Eosinophils Relative 5 %   Eosinophils Absolute 0.4 0.0 - 0.7 K/uL   Basophils Relative 1 %   Basophils Absolute 0.1 0.0 - 0.1 K/uL   Immature Granulocytes 0 %   Abs Immature Granulocytes 0.0 0.0 - 0.1 K/uL    Comment: Performed at [facility], [address] [place], [place] [address]  Comprehensive metabolic panel     Status: Abnormal   Collection Time: 07/20/18  4:58 AM  Result Value Ref Range   Sodium 138 135 - 145 mmol/L   Potassium 4.7 3.5 - 5.1 mmol/L   Chloride 103 98 - 111 mmol/L   CO2 29 22 - 32 mmol/L   Glucose, Bld 134 (H) 70 - 99 mg/dL   BUN 12 8 - 23 mg/dL   Creatinine, Ser 0.67 0.44 - 1.00 mg/dL   Calcium 8.3 (L) 8.9 - 10.3 mg/dL   Total Protein 6.3 (L) 6.5 - 8.1 g/dL   Albumin 2.8 (L) 3.5 - 5.0 g/dL   AST 26 15 - 41 U/L   ALT 16 0 - 44 U/L   Alkaline Phosphatase 58 38 - 126 U/L   Total Bilirubin 1.6 (H) 0.3 - 1.2 mg/dL   GFR calc non  Af Amer >60 >60 mL/min   GFR calc Af Amer >60 >60 mL/min    Comment: (NOTE) The eGFR has been calculated using the CKD EPI equation. This calculation has not been validated in all clinical situations. eGFR's persistently <60 mL/min signify possible Chronic Kidney Disease.    Anion gap 6 5 - 15    Comment: Performed at [facility] [address], [place], [place] [address]     HEENT: normal Cardio: RRR and no murmur Resp: CTA B/L and unlabored GI: BS positive and NT,ND Extremity:  No Edema Skin:   Intact Neuro: Abnormal Sensory cannot assess secondary to cognitive deficits and communication deficits, Abnormal Motor 5/5 RUE, 5/5 LUE and BLE and Abnormal FMC Ataxic/ dec FMC Musc/Skel:  Other no pain with UE and LE ROM, no pain with neckl movement Gen NAD, appears calm and cooperative Oriented to person and hospital  Assessment/Plan: 1. 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 Physiatrist is providing close team supervision and 24 hour management of active medical problems listed below. Physiatrist and rehab team continue to assess barriers to discharge/monitor patient progress toward functional and medical goals. FIM: Function - Bathing Position: Shower Body parts bathed by patient: Right arm, Left arm, Chest, Abdomen, Front perineal area, Buttocks, Right upper leg, Left upper leg, Right lower leg, Left lower leg, Back Assist Level: Supervision or verbal cues  Function- Upper Body Dressing/Undressing What is the patient wearing?: Bra, Pull over shirt/dress Bra - Perfomed by patient: Thread/unthread right bra strap, Thread/unthread left bra strap, Hook/unhook bra (pull down sports bra) Pull over shirt/dress - Perfomed by patient: Thread/unthread right sleeve, Thread/unthread left sleeve, Put head through opening, Pull shirt over trunk Assist Level: Set up,  Supervision or verbal cues Set up : To obtain clothing/put away Function - Lower Body Dressing/Undressing What is the patient wearing?: Underwear, Pants, Socks, Shoes Position: (sitting on BSC) Underwear - Performed by patient: Thread/unthread left underwear leg, Pull underwear up/down Underwear - Performed by helper: Thread/unthread right underwear leg Pants- Performed by patient: Thread/unthread left pants leg, Pull pants up/down Pants- Performed by helper: Thread/unthread right pants leg Socks - Performed by patient: Don/doff right sock, Don/doff left sock Socks - Performed by helper: Don/doff right sock, Don/doff left sock Shoes - Performed by patient: Don/doff left shoe Shoes - Performed by helper: Don/doff right shoe, Don/doff left shoe, Fasten right, Fasten left Assist for footwear: Maximal assist Assist for lower body dressing: (Max assist)  Function - Toileting Toileting steps completed by patient: Adjust clothing prior to toileting, Performs perineal hygiene, Adjust clothing after toileting Assist level: Touching or steadying assistance (Pt.75%)  Function - Air cabin crew transfer assistive device: Grab bar Assist level to toilet: Touching or steadying assistance (Pt > 75%) Assist level from toilet: Touching or steadying assistance (Pt > 75%)  Function - Chair/bed transfer Chair/bed transfer method: Ambulatory Chair/bed transfer assist level: Touching or steadying assistance (Pt > 75%) Chair/bed transfer assistive device: Armrests Chair/bed transfer details: Tactile cues for initiation, Tactile cues for sequencing  Function - Locomotion: Wheelchair Will patient use wheelchair at discharge?: No Wheelchair activity did not occur: Safety/medical concerns Wheel 50 feet with 2 turns activity did not occur: Safety/medical concerns Wheel 150 feet activity did not occur: Safety/medical concerns Function - Locomotion: Ambulation Assistive device: No device Max  distance: 150' Assist level: Touching or steadying assistance (Pt > 75%) Assist level: Touching or steadying assistance (Pt > 75%) Assist level: Touching or steadying assistance (Pt > 75%) Walk 150 feet activity did not occur: Safety/medical concerns Assist level: Touching or steadying assistance (Pt > 75%) Walk 10 feet on uneven surfaces activity did not occur: Safety/medical concerns  Function - Comprehension Comprehension: Visual Comprehension assistive device: Other(Sign Language) Comprehension assist level: Understands basic 75 - 89% of the time/ requires cueing 10 - 24% of the time  Function - Expression Expression: Nonverbal Expression assistive device: Other (Comment)(Sign Language) Expression assist level: Expresses basic 75 - 89% of the time/requires cueing 10 - 24% of the time. Needs helper to occlude trach/needs to repeat words., Expresses basic 90% of the time/requires cueing < 10% of the time.  Function - Social Interaction Social Interaction assist level: Interacts appropriately 25 - 49% of time - Needs frequent redirection., Interacts appropriately 50 - 74% of the time - May be physically or verbally inappropriate.  Function - Problem Solving Problem solving assist level:  Solves basic 75 - 89% of the time/requires cueing 10 - 24% of the time  Function - Memory Memory assist level: Recognizes or recalls 25 - 49% of the time/requires cueing 50 - 75% of the time Patient normally able to recall (first 3 days only): Current season, That he or she is in a hospital  Medical Problem List and Plan:  1. Right sided weakness and cognitive deficits secondary to Left ACA stroke.  -CIR PT, OT, SLP-still not at cognitive baseline per caregiver-given pt's need for interpreter and autism, we are going to try to keep therapies at a consistent time each day. Family states that she is much better from an activity tolerance standpoint if she has a schedule.  2. DVT  Prophylaxis/Anticoagulation: Pharmaceutical: Lovenox  3. Pain Management: N/A  4. Mood: Maintain consistent schedule/routine. LCSW to follow for assistance,  5. Neuropsych: This patient is not capable of making decisions on her own behalf.  6. Skin/Wound Care: routine pressure relief measures.  7. Fluids/Electrolytes/Nutrition: Monitor I/O.   I 946m improved, BMET normal 8/29  8. HTN: Monitor bid --avoid hypotension.  Vitals:   07/21/18 2122 07/22/18 0534  BP:  (!) 153/93  Pulse:  (!) 101  Resp: 18 15  Temp:  98 F (36.7 C)  SpO2:  98%  permissive for now, had extension due to low BP, may need to remain in the 140-160 range for another 1-2 wks 9. Hypothyroid: On supplement.  10. RLS/Anxiety disorder: used Klonopin prn. Currently refusing use.  11. Seizure disorder post ICH: On Keppra bid. 12.  Hypoalb - add pro stat LOS (Days) 3 A FACE TO FACE EVALUATION WAS PERFORMED  ACharlett [name]8/30/2019, 8:52 AM

## 2018-07-22 NOTE — Progress Notes (Signed)
Occupational Therapy Session Note  Patient Details  Name: Laura [name] MRN: [id number] Date of Birth: [birth year]  Today's Date: 07/22/2018 OT Individual Time: 0900-1004 OT Individual Time Calculation (min): 64 min    Short Term Goals: Week 1:  OT Short Term Goal 1 (Week 1): STG = LTGs due to ELOS  Skilled Therapeutic Interventions/Progress Updates:    Treatment session with focus on ADL retraining and safety during self-care tasks.  Pt received upright in recliner ready for shower.  Ambulated around room with min assist to min guard while obtaining clothing prior to shower.  Pt ambulated to room shower and engaged in bathing at seated level on shower seat.  Per hired Comptrollersitter, pt does not use shower seat at home and pt in agreement that she will not use seat at home.  Will plan to engage in shower in ADL apt to practice tub/shower transfers and assess safety with bathing at standing level.  Min guard for standing balance during dynamic tasks.  Educated on recommendation for sitting when completing LB dressing to decrease fall risk in single leg stance with sitter and pt reporting understanding.  Pt required assistance with donning and fastening Rt shoe this session.  Completed grooming tasks in standing with supervision.  Sign language interpreter present throughout session.  Therapy Documentation Precautions:  Precautions Precautions: Fall Precaution Comments: deaf  Restrictions Weight Bearing Restrictions: No Pain: Pain Assessment Pain Scale: 0-10 Pain Score: 0-No pain  See Function Navigator for Current Functional Status.   Therapy/Group: Individual Therapy  Rosalio LoudHOXIE, Tsutomu Barfoot 07/22/2018, 12:13 PM

## 2018-07-22 NOTE — IPOC Note (Signed)
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 [facility])     Functional Problem List: Nursing Bladder, Bowel, Medication Management, Motor, Nutrition, Perception, Safety  PT Balance, Behavior, Perception, Endurance, Safety, Motor, Sensory  OT Balance, Cognition, Motor, Safety  SLP    TR         Basic ADL's: OT Grooming, Bathing, Dressing, Toileting     Advanced  ADL's: OT       Transfers: PT Bed Mobility, Floor, Bed to Chair, Car, Occupational psychologist, Research scientist (life sciences): PT Ambulation, Educational psychologist, Psychologist, prison and probation services     Additional Impairments: OT Fuctional Use of Upper Extremity  SLP Communication, Social Cognition expression, comprehension Problem Solving, Memory, Attention, Awareness  TR      Anticipated Outcomes Item Anticipated Outcome  Self Feeding Supervision  Swallowing  Supervision A   Basic self-care  Supervision  Toileting  Supervision   Bathroom Transfers Supervision  Bowel/Bladder  managed bowel and bladder toileting every 3 hours   Transfers  supervision with LRAD  Locomotion  supervision with LRAD  Communication  Min A  Cognition  Min A, Mod A  Pain  less than 2  Safety/Judgment  min assist    Therapy Plan: PT Intensity: Minimum of 1-2 x/day ,45 to 90 minutes PT Frequency: 5 out of 7 days PT Duration Estimated Length of Stay: 1 week OT Intensity: Minimum of 1-2 x/day, 45 to 90 minutes OT Frequency: 5 out of 7 days OT Duration/Estimated Length of Stay: 5-7 days SLP Intensity: Minumum of 1-2 x/day, 30 to 90 minutes SLP Frequency: 3 to 5 out of 7 days SLP Duration/Estimated Length of Stay: 9/3    Team Interventions: Nursing Interventions Patient/Family Education, Bladder Management, Disease Management/Prevention, Pain  Management, Medication Management, Skin Care/Wound Management, Cognitive Remediation/Compensation, Discharge Planning  PT interventions Ambulation/gait training, Cognitive remediation/compensation, Discharge planning, DME/adaptive equipment instruction, Functional mobility training, Pain management, Psychosocial support, Splinting/orthotics, Therapeutic Activities, UE/LE Strength taining/ROM, Visual/perceptual remediation/compensation, Wheelchair propulsion/positioning, UE/LE Coordination activities, Therapeutic Exercise, Stair training, Patient/family education, Neuromuscular re-education, Disease management/prevention, Firefighter, Warden/ranger  OT Interventions Warden/ranger, Cognitive remediation/compensation, Discharge planning, Disease mangement/prevention, DME/adaptive equipment instruction, Functional mobility training, Neuromuscular re-education, Pain management, Patient/family education, Psychosocial support, Self Care/advanced ADL retraining, Skin care/wound managment, Therapeutic Activities, Therapeutic Exercise, UE/LE Strength taining/ROM, UE/LE Coordination activities  SLP Interventions Cognitive remediation/compensation, Cueing hierarchy, Dysphagia/aspiration precaution training, Functional tasks, Patient/family education  TR Interventions    SW/CM Interventions Discharge Planning, Psychosocial Support, Patient/Family Education   Barriers to Discharge MD  Medical stability, Behavior and hearing and speech impairment  Nursing      PT (baseline cognitive deficits)    OT      SLP      SW       Team Discharge Planning: Destination: PT-Assisted Living ,OT- Home(to independent living) , SLP-Home Projected Follow-up: PT-Home health PT, 24 hour supervision/assistance, OT-  24 hour supervision/assistance, Home health OT, SLP-None Projected Equipment Needs: PT-To be determined, OT- To be determined, SLP-None recommended by SLP Equipment Details:  PT- , OT-  Patient/family involved in discharge planning: PT- Family member/caregiver,  OT-Patient, Family member/caregiver, SLP-Patient, Family member/caregiver  MD ELOS: 7d Medical Rehab Prognosis:  Good Assessment:  77 year old female past medical history of HTN, 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 POA reported 2-week history of cognitive and language impairments with waxing and waning of symptoms, confusion, Ms. spelling due to signing as well as occasional staring off activity. CT head was negative for acute stroke. MRI of brain done revealing cluster of small acute infarcts in the distal left PCA territory, ventriculomegaly from left greater than right volume loss remote left temporal lobe hemorrhage and left lateral lenticulostriate infarct. Neurology consulted for input and recommended continuing Keppra twice daily question of compliance prior to admission. 2D echo showed EF of 55 to 60% with no wall abnormality. Bilateral lower extreme Dopplers were negative for DVT  Dr. Ladona Ridgelaylor consulted for input on bradycardia and did not feel the need for PPM as patient without clinical symptoms and mildly prolonged QRS noted on EKG. He recommended avoiding AV nodal blocking agents and follow-up for 2-week monitor on outpatient basis. She did develop right-sided weakness with brief loss of consciousness on 8:26 PM after receiving IV hydralazine for elevated blood pressure--patient has not been taking meds consistently during admission. Repeat MRI of brain showed increase in number of acute distal left ACA infarcts. Dr Roda ShuttersXu felt that extension of stroke was due to abrupt drop in BP and to continue DAPT for 3 months followed by Plavix alone and to avoid hypotension. as due to hypotension and recommended compliance with medications. Therapy ongoing and patient limited by increase in weakness, decline in cognitive/communicative  ability, visual impairments and poor safety. CIR recommended due to functional decline.  With assistance of interpreter and POA:    Now requiring 24/7 Rehab RN,MD, as well as CIR level PT, OT and SLP.  Treatment team will focus on ADLs and mobility with goals set at Sup for physical , min for communication See Team Conference Notes for weekly updates to the plan of care

## 2018-07-22 NOTE — Progress Notes (Signed)
Occupational Therapy Session Note  Patient Details  Name: Laura [name] MRN: [id number] Date of Birth: 1941-01-11  Today's Date: 07/22/2018 OT Individual Time: 1100-1200 OT Individual Time Calculation (min): 60 min    Short Term Goals: Week 1:  OT Short Term Goal 1 (Week 1): STG = LTGs due to ELOS     Skilled Therapeutic Interventions/Progress Updates:    Pt seen this session to focus on safe bathroom mobility skills and education with pt and caregiver on safe tub transfers.  Pt received in room with sign interpretor and caregiver.  Pt has had a tub bench in the past but threw it away as she refuses to use it.  Pt agreeable to ambulating to tub room which is set up in the same way as her home tub. Min A with ambulation (with a slight posterior lean in upper trunk) to tub room (300 ft) with support at her pelvis. Pt initially wanted to step in and out of tub in a forward motion.  Explained that she could likely catch her foot on the tub wall. Demonstrated with repeat demonstration stepping in laterally using a grab bar placed at the front of the tub wall. Pt was able to repeat demonstrate 2x with light contact guard A.  She then demonstrated how she can stand and bathe with close S.  Emphasized to pt that she needs to have a caregiver present for these tasks.   She then ambulated to therapy gym to work on R shoulder AROM using UE ranger, reaching activity, and B hands on large therapy ball lifting ball.  Her caregiver felt the R arm weakness from the stroke impacted the pt's signing ability to lift her arm high enough. [facility] enough to form the signs with her R hand.  Pt pointed to the wall of rollators and indicated that she had one at home and liked walking with it.  Her caregiver said she did use it initially but then stopped and felt pt needed close S as pt never remembered to lock the breaks. Pt provided with one to try and then walked over 500 feet with close  guarding.  She did not bump into anything, ambulate too fast and demonstrated improved posture with B hands on walker handles.  Left rollator in the room and let them know I would speak with her PT to see if this is a mobility device she should use.    Pt toileted and then washed hands at sink with S. Pt resting in recliner with caregiver in the room.  Therapy Documentation Precautions:  Precautions Precautions: Fall Precaution Comments: deaf  Restrictions Weight Bearing Restrictions: No   Pain: Pain Assessment Pain Scale: 0-10 Pain Score: 0-No pain ADL:    See Function Navigator for Current Functional Status.   Therapy/Group: Individual Therapy  Arlee Santosuosso 07/22/2018, 1:15 PM

## 2018-07-22 NOTE — Progress Notes (Signed)
Physical Therapy Session Note  Patient Details  Name: Laura [name] MRN: [id number] Date of Birth: 1941-05-08  Today's Date: 07/22/2018 PT Individual Time: 1307-1422 PT Individual Time Calculation (min): 75 min   Short Term Goals: Week 1:  PT Short Term Goal 1 (Week 1): STG = LTG due to short ELOS.  Skilled Therapeutic Interventions/Progress Updates:  Pt received in recliner with sitter & professional interpreter present. No c/o pain reported. Session focused heavily on trialing various AD's to determine which pt is most safe with. Pt ambulated with rollator with significantly increased gait speed and supervision but with decreased attention as pt looking up at ceiling, running into obstacles on R (possibly due to impaired visual field in R eye or decreased overall attention), and absent safety awareness regarding brake management requiring MAX cuing. [facility]ed to trial [facility] but pt elects to use AD in RUE despite education to use it in LUE. Trialed RW but pt pushing it to the side and attempting to use rollator instead. Gait without AD with close supervision, occasional steady assist 2/2 occasional LOB with decreased gait speed which in turn causes pt to focus more on gait. Educated Comptrollersitter on pt's increased safety without AD. Also educated sitter on positioning in relation to pt when ambulating upon d/c. Pt completed floor transfer with encouragement as pt reports anxiety over task. Pt with impaired awareness when transferring onto floor requiring min assist for safety, but able to transfer to mat table with steady assist. Pt engaged in ball toss with supervision with encouragement to attempt task with narrow BOS to challenge balance further but pt with limited willingness to attempt. Pt requested to utilize nu-step and did so at level 2 x 10 minutes with all four extremities with task focusing on coordination of reciprocal movements and endurance training. Pt ambulates back to room without AD with  decreased RUE reciprocal arm swing. Attempted retrograde gait with min assist to challenge pt's balance but pt only engaged in task x 5 ft. Pt used bathroom with continent void & performed peri hygiene and clothing management with supervision and extra time. Pt completed hand hygiene standing at sink with supervision. At end of session pt left sitting in recliner with all needs in reach & personal sitter present.  Discussed checking sitter [name] off to assist pt to bathroom but [name] reports she encourages pt to call for nursing staff to assist with this therefore training not completed.   Therapy Documentation Precautions:  Precautions Precautions: Fall Precaution Comments: deaf  Restrictions Weight Bearing Restrictions: No   See Function Navigator for Current Functional Status.   Therapy/Group: Individual Therapy  Sandi MariscalVictoria M Kaicee Scarpino 07/22/2018, 4:01 PM

## 2018-07-23 ENCOUNTER — Inpatient Hospital Stay (HOSPITAL_COMMUNITY): Payer: Self-pay | Admitting: Occupational Therapy

## 2018-07-23 ENCOUNTER — Inpatient Hospital Stay (HOSPITAL_COMMUNITY): Payer: Self-pay

## 2018-07-23 MED ORDER — BLOOD PRESSURE CONTROL BOOK
Freq: Once | Status: AC
  Administered 2018-07-23: 17:00:00
  Filled 2018-07-23: qty 1

## 2018-07-23 NOTE — Plan of Care (Signed)
  Problem: Consults Goal: RH STROKE PATIENT EDUCATION Description See Patient Education module for education specifics  Outcome: Progressing Goal: Nutrition Consult-if indicated Outcome: Progressing   Problem: RH BLADDER ELIMINATION Goal: RH STG MANAGE BLADDER WITH ASSISTANCE Description STG Manage Bladder With mod  Assistance  Outcome: Progressing   Problem: RH SKIN INTEGRITY Goal: RH STG SKIN FREE OF INFECTION/BREAKDOWN Description Patient will have no breakdown of skin at discharge.  Outcome: Progressing   Problem: RH SAFETY Goal: RH STG ADHERE TO SAFETY PRECAUTIONS W/ASSISTANCE/DEVICE Description STG Adhere to Safety Precautions With min  Assistance/Device.  Outcome: Progressing Goal: RH STG DECREASED RISK OF FALL WITH ASSISTANCE Description STG Decreased Risk of Fall With  Min Assistance.  Outcome: Progressing   Problem: RH COGNITION-NURSING Goal: RH STG USES MEMORY AIDS/STRATEGIES W/ASSIST TO PROBLEM SOLVE Description STG Uses Memory Aids/Strategies With max Assistance to Problem Solve.  Outcome: Progressing Goal: RH STG ANTICIPATES NEEDS/CALLS FOR ASSIST W/ASSIST/CUES Description STG Anticipates Needs/Calls for Assist With Assistance/Cues. Outcome: Progressing   Problem: RH PAIN MANAGEMENT Goal: RH STG PAIN MANAGED AT OR BELOW PT'S PAIN GOAL Description Less than 2   Outcome: Progressing   Problem: RH KNOWLEDGE DEFICIT Goal: RH STG INCREASE KNOWLEDGE OF HYPERTENSION Description POA will be able to verbalize understanding of hypertension at discharge  Outcome: Progressing Goal: RH STG INCREASE KNOWLEGDE OF HYPERLIPIDEMIA Description POA will be able to verbalize understanding of high cholesterol  Outcome: Progressing Goal: RH STG INCREASE KNOWLEDGE OF STROKE PROPHYLAXIS Description POA will be able to verbalize understanding of signs and symptoms of stroke at discharge.  Outcome: Progressing   

## 2018-07-23 NOTE — Progress Notes (Signed)
Physical Therapy Session Note  Patient Details  Name: Laura [name] MRN: [id number] Date of Birth: 07/26/1941  Today's Date: 07/23/2018 PT Individual Time: 1100-1154 PT Individual Time Calculation (min): 54 min   Short Term Goals: Week 1:  PT Short Term Goal 1 (Week 1): STG = LTG due to short ELOS.  Skilled Therapeutic Interventions/Progress Updates:    Pt seated in recliner upon PT arrival, agreeable to therapy tx and denies pain. Sign language interpreter present throughout the entire session. Pt transferred to standing with supervision and ambulated to the gym with CGA, pt requesting to keep walking. Pt ambulated throughout unit x 400 ft with CGA, decreased R arm swing and occasional cues for obstacle avoidance on R side. Pt requesting to use nustep, transferred on/off nustep with CGA and used x 5 minutes on workload 5 for global strengthening and endurance. Pt ambulated to the gym x 150 ft with CGA. Pt worked on dynamic balance this session without UE support including ball toss while standing on level surface, ball toss on airex, ambulation to weave through cones x 2 trials, side stepping x 10 ft in each direction, ambulating while tossing ball, all with CGA to min assist. Pt ambulated to the ortho gym x 150 ft with CGA and performed car transfer with min assist. Pt ambulated x 200 ft back to room with CGA and ambulated to sink to wash hands. Pt ambulated to recliner and left with needs in reach and caregiver present.   Therapy Documentation Precautions:  Precautions Precautions: Fall Precaution Comments: deaf  Restrictions Weight Bearing Restrictions: No   See Function Navigator for Current Functional Status.   Therapy/Group: Individual Therapy  Cresenciano GenreEmily van Schagen, PT, DPT 07/23/2018, 9:35 AM

## 2018-07-23 NOTE — Progress Notes (Signed)
Occupational Therapy Session Note  Patient Details  Name: Laura [name] MRN: [id number] Date of Birth: 11-14-1941  Today's Date: 07/23/2018 OT Individual Time: 0900-1000 and 1305-1415 OT Individual Time Calculation (min): 60 min and 70 min   Short Term Goals: Week 1:  OT Short Term Goal 1 (Week 1): STG = LTGs due to ELOS  Skilled Therapeutic Interventions/Progress Updates:    Visit 1: pt's sign interpretor, POA and friend present Pain:  no c/o pain Pt seen this session for BADL training to assess pt's safety without using DME as pt has refused to use a tub bench at home in the past and wants to just shower in standing.  Pt received in bed and ambulated to bathroom to doff pajamas, she doffed in standing with steadying A.  She was able to complete 95% of the shower with close S but then needed min A to support balance as she tried to lift R foot to wash foot.  Pt was demonstrating fatigue with heavy breathing toward the end of the shower and encouraged to sit and rest.  VERY difficult for pt to rest as she is accustomed to completing her routine without taking breaks.  Pt guided to sit on the toilet to dress as she wanted to dress standing up.  Pt with continued heavy breathing, cued to rest but she kept trying to move.  Pt did ambulate back to recliner to rest for a few minutes. Discussed with pt and her POA that pt is NOT safe to stand in shower due to poor activity tolerance and the increased risk of falls as she tries to wash her feet.  POA is in complete agreement.  Pt will need several times to practice with the tub bench to learn a new habit. Pt ambulated with CGA to tub room.  Pt was able to get on and off tub bench with S and practiced a simulated task of washing feet.  Pt ambulated back to room to rest with her caregivers.   Visit 2: pt's sign interpretor, POA Pain: no c/o pain Pt agreeable to ambulating to tub room with guarding A to practice tub transfer bench a 2nd time.  Pt  initially wanted to step in but with visual demonstration, she used bench properly with S.  She then ambulated to day room to participate in dynavision activity demonstrating good response to finding lights on her R side and completed in standing with S. Pt eager to use NuStep again.  Pt transferred onto seat of NuStep with S.  Pt said she wanted to do Nustep for 10 min but then decided to do 20.  Pt had a resistance of 4.  Pt then worked on a dynamic reaching activity of picking up colored blocks on chair seat to her R and placing them in colored order on a tall table using her R arm.  Pt needed cuing with how to step back and rotate body.  Once she got the concept she was able to work on that task for 10 min.  Pt then carried the box back to the game cabinet and the ambulated back to her room with guarding A.  Pt in room with POA and all needs met.    Therapy Documentation Precautions:  Precautions Precautions: Fall Precaution Comments: deaf  Restrictions Weight Bearing Restrictions: No  See Function Navigator for Current Functional Status.   Therapy/Group: Individual Therapy  Pepin 07/23/2018, 7:54 AM

## 2018-07-23 NOTE — Progress Notes (Signed)
Subjective/Complaints:  Patient up eating breakfast in bed.  Sign language interpreter at bedside.  Patient denies any new problems.  Excited about going home on Tuesday  ROS: Patient denies fever, rash, sore throat, blurred vision, nausea, vomiting, diarrhea, cough, shortness of breath or chest pain, joint or back pain, headache, or mood change.    Objective: Vital Signs: Blood pressure (!) 167/89, pulse 89, temperature 97.9 F (36.6 C), temperature source Oral, resp. rate 17, height 5\' 3"  (1.6 m), weight 59.2 kg, SpO2 98 %. No results found. No results found for this or any previous visit (from the past 72 hour(s)).   Constitutional: No distress . Vital signs reviewed. HEENT: EOMI, oral membranes moist Neck: supple Cardiovascular: RRR without murmur. No JVD    Respiratory: CTA Bilaterally without wheezes or rales. Normal effort    GI: BS +, non-tender, non-distended  Neuro: Abnormal Sensory cannot assess secondary to cognitive deficits and communication deficits, Abnormal Motor 5/5 RUE, 5/5 LUE and BLE and Abnormal FMC Ataxic/ dec FMC Musc/Skel: Normal range of motion Psych: Pleasant and cooperative   Assessment/Plan: 1. 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 Physiatrist is providing close team supervision and 24 hour management of active medical problems listed below. Physiatrist and rehab team continue to assess barriers to discharge/monitor patient progress toward functional and medical goals. FIM: Function - Bathing Position: Shower Body parts bathed by patient: Right arm, Left arm, Chest, Abdomen, Front perineal area, Buttocks, Right upper leg, Left upper leg, Right lower leg, Left lower leg, Back Assist Level: Supervision or verbal cues  Function- Upper Body Dressing/Undressing What is the patient wearing?: Bra, Pull over shirt/dress Bra - Perfomed by  patient: Thread/unthread right bra strap, Thread/unthread left bra strap, Hook/unhook bra (pull down sports bra) Pull over shirt/dress - Perfomed by patient: Thread/unthread right sleeve, Thread/unthread left sleeve, Put head through opening, Pull shirt over trunk Assist Level: Set up, Supervision or verbal cues Set up : To obtain clothing/put away Function - Lower Body Dressing/Undressing What is the patient wearing?: Underwear, Pants, Socks, Shoes Position: (sitting on tub bench) Underwear - Performed by patient: Thread/unthread right underwear leg, Thread/unthread left underwear leg, Pull underwear up/down Underwear - Performed by helper: Thread/unthread right underwear leg Pants- Performed by patient: Thread/unthread right pants leg, Thread/unthread left pants leg, Pull pants up/down Pants- Performed by helper: Thread/unthread right pants leg Socks - Performed by patient: Don/doff right sock, Don/doff left sock Socks - Performed by helper: Don/doff right sock, Don/doff left sock Shoes - Performed by patient: Don/doff left shoe, Fasten left Shoes - Performed by helper: Don/doff right shoe, Fasten right Assist for footwear: Partial/moderate assist Assist for lower body dressing: Touching or steadying assistance (Pt > 75%)(Min assist)  Function - Toileting Toileting steps completed by patient: Adjust clothing prior to toileting, Performs perineal hygiene, Adjust clothing after toileting Assist level: Supervision or verbal cues  Function - ArchivistToilet Transfers Toilet transfer assistive device: Grab bar Assist level to toilet: Touching or steadying assistance (Pt > 75%) Assist level from toilet: Touching or steadying assistance (Pt > 75%)  Function - Chair/bed transfer Chair/bed transfer method: Ambulatory Chair/bed transfer assist level: Touching or steadying assistance (Pt > 75%) Chair/bed transfer assistive device: Armrests Chair/bed transfer details: Tactile cues for initiation, Tactile  cues for sequencing  Function - Locomotion: Wheelchair Will patient use wheelchair at discharge?: No Wheelchair activity did not occur: Safety/medical concerns Wheel 50 feet with  2 turns activity did not occur: Safety/medical concerns Wheel 150 feet activity did not occur: Safety/medical concerns Function - Locomotion: Ambulation Assistive device: No device Max distance: 100 ft Assist level: Touching or steadying assistance (Pt > 75%) Assist level: Touching or steadying assistance (Pt > 75%) Assist level: Touching or steadying assistance (Pt > 75%) Walk 150 feet activity did not occur: Safety/medical concerns Assist level: Touching or steadying assistance (Pt > 75%) Walk 10 feet on uneven surfaces activity did not occur: Safety/medical concerns  Function - Comprehension Comprehension: Visual Comprehension assistive device: Other(Sign Language) Comprehension assist level: Understands basic 75 - 89% of the time/ requires cueing 10 - 24% of the time  Function - Expression Expression: Nonverbal Expression assistive device: Other (Comment)(Sign Language) Expression assist level: Expresses basic 75 - 89% of the time/requires cueing 10 - 24% of the time. Needs helper to occlude trach/needs to repeat words., Expresses basic 90% of the time/requires cueing < 10% of the time.  Function - Social Interaction Social Interaction assist level: Interacts appropriately 25 - 49% of time - Needs frequent redirection., Interacts appropriately 50 - 74% of the time - May be physically or verbally inappropriate.  Function - Problem Solving Problem solving assist level: Solves basic 75 - 89% of the time/requires cueing 10 - 24% of the time  Function - Memory Memory assist level: Recognizes or recalls 25 - 49% of the time/requires cueing 50 - 75% of the time Patient normally able to recall (first 3 days only): Current season, That he or she is in a hospital  Medical Problem List and Plan:  1. Right  sided weakness and cognitive deficits secondary to Left ACA stroke.  -CIR PT, OT, SLP-still not at cognitive baseline per caregiver-given pt's need for interpreter and autism, we are going to try to keep therapies at a consistent time each day.    2. DVT Prophylaxis/Anticoagulation: Pharmaceutical: Lovenox  3. Pain Management: N/A  4. Mood: Maintain consistent schedule/routine. LCSW to follow for assistance,  5. Neuropsych: This patient is not capable of making decisions on her own behalf.  6. Skin/Wound Care: routine pressure relief measures.  7. Fluids/Electrolytes/Nutrition: Monitor I/O.   Improving p.o. intake.  BMET normal 8/29  8. HTN: Monitor bid --avoid hypotension.  Vitals:   07/22/18 1525 07/22/18 2036  BP: (!) 151/98 (!) 167/89  Pulse: (!) 104 89  Resp: 14 17  Temp: 98.6 F (37 C) 97.9 F (36.6 C)  SpO2: 97% 98%  permissive for now, had extension due to low BP, keep in the 140-160 range for another 1-2 wks 9. Hypothyroid: On supplement.  10. RLS/Anxiety disorder: used Klonopin prn. Currently refusing use.  11. Seizure disorder post ICH: On Keppra bid. 12.  Hypoalb - add pro stat   LOS (Days) 4 A FACE TO FACE EVALUATION WAS PERFORMED  Ranelle Oyster 07/23/2018, 9:52 AM

## 2018-07-24 ENCOUNTER — Inpatient Hospital Stay (HOSPITAL_COMMUNITY): Payer: Self-pay | Admitting: Occupational Therapy

## 2018-07-24 NOTE — Progress Notes (Signed)
Occupational Therapy Session Note  Patient Details  Name: Laura [name] MRN: [id number] Date of Birth: May 16, 1941  Today's Date: 07/24/2018 OT Individual Time: 1300-1350 OT Individual Time Calculation (min): 50 min    Short Term Goals: Week 1:  OT Short Term Goal 1 (Week 1): STG = LTGs due to ELOS       Skilled Therapeutic Interventions/Progress Updates:   Present in room at time of treatment:   pt's sign interpretor, sitter Pain: no c/o pain PPt sitting on toilet with NA present.  OT remained in bathroom and NA left room.  PPt was supervision with toileting; sit to stand.  Ppt needed cues to flush toilet and wash hands.  Ambulated to day room.  PPt bee- lined to nustep.  Interpretor communicated  that ppt liked doing nustep most all.  Transferred onto seat of NuStep with S.  Pt said she wanted to do Nustep for  20 minutes but  OT set timer for 5 minutes for a resistance of 4.   OT communicated to ppt that we would come back to nustep later.   Pt ambulated to gym with CGA and cues to slow pace.  Moderate right inattention to objects on right side.  Practiced handwriting activities using RUE.  Ppt wrote name, and where she lived.  Pt reported  That she did not want to give out her address at first.  Asked ppt to write where she is right now.  1-2 minutes later  she started  writinger address with no cues. No coordination issues noted , but delayed processing noted   Ambulated 200 feet with cues for ppt to find  nustep where she was 15 minutes ago.    PPt was able to find day room with increased time and min directional cues.  Ppt rode nustep for 5 minutes.  OT attempted another therapeutic activity for RUE but ppt  not interested in further treatment.    Asked ppt to find her room.  Ppt able to find room with min directional cues.  Left in recliner with sign interpretor, sitter, Charity fundraiser.      Therapy Documentation Precautions:  Precautions Precautions: Fall Precaution Comments: deaf   Restrictions Weight Bearing Restrictions: No   Pain:  none    See Function Navigator for Current Functional Status.   Therapy/Group: Individual Therapy  Humberto Seals 07/24/2018, 2:06 PM

## 2018-07-24 NOTE — Plan of Care (Signed)
  Problem: Consults Goal: RH STROKE PATIENT EDUCATION Description See Patient Education module for education specifics  Outcome: Progressing Goal: Nutrition Consult-if indicated Outcome: Progressing   Problem: RH BLADDER ELIMINATION Goal: RH STG MANAGE BLADDER WITH ASSISTANCE Description STG Manage Bladder With mod  Assistance  Outcome: Progressing   Problem: RH SKIN INTEGRITY Goal: RH STG SKIN FREE OF INFECTION/BREAKDOWN Description Patient will have no breakdown of skin at discharge.  Outcome: Progressing   Problem: RH SAFETY Goal: RH STG ADHERE TO SAFETY PRECAUTIONS W/ASSISTANCE/DEVICE Description STG Adhere to Safety Precautions With min  Assistance/Device.  Outcome: Progressing Goal: RH STG DECREASED RISK OF FALL WITH ASSISTANCE Description STG Decreased Risk of Fall With  [employer].  Outcome: Progressing   Problem: RH COGNITION-NURSING Goal: RH STG USES MEMORY AIDS/STRATEGIES W/ASSIST TO PROBLEM SOLVE Description STG Uses Memory Aids/Strategies With max Assistance to Problem Solve.  Outcome: Progressing Goal: RH STG ANTICIPATES NEEDS/CALLS FOR ASSIST W/ASSIST/CUES Description STG Anticipates Needs/Calls for Assist With Assistance/Cues. Outcome: Progressing   Problem: RH PAIN MANAGEMENT Goal: RH STG PAIN MANAGED AT OR BELOW PT'S PAIN GOAL Description Less than 2   Outcome: Progressing   Problem: RH KNOWLEDGE DEFICIT Goal: RH STG INCREASE KNOWLEDGE OF HYPERTENSION Description POA will be able to verbalize understanding of hypertension at discharge  Outcome: Progressing Goal: RH STG INCREASE KNOWLEGDE OF HYPERLIPIDEMIA Description POA will be able to verbalize understanding of high cholesterol  Outcome: Progressing Goal: RH STG INCREASE KNOWLEDGE OF STROKE PROPHYLAXIS Description POA will be able to verbalize understanding of signs and symptoms of stroke at discharge.  Outcome: Progressing

## 2018-07-25 ENCOUNTER — Inpatient Hospital Stay (HOSPITAL_COMMUNITY): Payer: Self-pay | Admitting: Physical Therapy

## 2018-07-25 ENCOUNTER — Inpatient Hospital Stay (HOSPITAL_COMMUNITY): Payer: Medicare Other | Admitting: Physical Therapy

## 2018-07-25 ENCOUNTER — Inpatient Hospital Stay (HOSPITAL_COMMUNITY): Payer: Self-pay

## 2018-07-25 LAB — CBC
HEMATOCRIT: 41.4 % (ref 36.0–46.0)
Hemoglobin: 13.4 g/dL (ref 12.0–15.0)
MCH: 30 pg (ref 26.0–34.0)
MCHC: 32.4 g/dL (ref 30.0–36.0)
MCV: 92.8 fL (ref 78.0–100.0)
Platelets: 309 10*3/uL (ref 150–400)
RBC: 4.46 MIL/uL (ref 3.87–5.11)
RDW: 12.3 % (ref 11.5–15.5)
WBC: 8.7 10*3/uL (ref 4.0–10.5)

## 2018-07-25 LAB — URINALYSIS, ROUTINE W REFLEX MICROSCOPIC
Bilirubin Urine: NEGATIVE
GLUCOSE, UA: 150 mg/dL — AB
Hgb urine dipstick: NEGATIVE
KETONES UR: NEGATIVE mg/dL
LEUKOCYTES UA: NEGATIVE
NITRITE: NEGATIVE
Protein, ur: NEGATIVE mg/dL
Specific Gravity, Urine: 1.011 (ref 1.005–1.030)
pH: 6 (ref 5.0–8.0)

## 2018-07-25 LAB — COMPREHENSIVE METABOLIC PANEL
ALBUMIN: 3.2 g/dL — AB (ref 3.5–5.0)
ALK PHOS: 61 U/L (ref 38–126)
ALT: 28 U/L (ref 0–44)
AST: 27 U/L (ref 15–41)
Anion gap: 7 (ref 5–15)
BILIRUBIN TOTAL: 0.5 mg/dL (ref 0.3–1.2)
BUN: 26 mg/dL — AB (ref 8–23)
CALCIUM: 8.3 mg/dL — AB (ref 8.9–10.3)
CO2: 26 mmol/L (ref 22–32)
Chloride: 103 mmol/L (ref 98–111)
Creatinine, Ser: 0.77 mg/dL (ref 0.44–1.00)
GFR calc Af Amer: >60 mL/min (ref >60)
GFR calc non Af Amer: >60 mL/min (ref >60)
GLUCOSE: 153 mg/dL — AB (ref 70–99)
Potassium: 4.3 mmol/L (ref 3.5–5.1)
SODIUM: 136 mmol/L (ref 135–145)
Total Protein: 6.7 g/dL (ref 6.5–8.1)

## 2018-07-25 MED ORDER — LORATADINE 10 MG PO TABS
10.0000 mg | ORAL_TABLET | Freq: Every day | ORAL | Status: DC | PRN
  Administered 2018-07-25: 10 mg via ORAL
  Filled 2018-07-25: qty 1

## 2018-07-25 NOTE — Progress Notes (Signed)
Physical Therapy Session Note  Patient Details  Name: Laura [name] MRN: [id number] Date of Birth: 1941-11-20  Today's Date: 07/25/2018 PT Individual Time: 0902-1000 PT Individual Time Calculation (min): 58 min   Short Term Goals: Week 1:  PT Short Term Goal 1 (Week 1): STG = LTG due to short ELOS.  Skilled Therapeutic Interventions/Progress Updates: Pt presented in recliner with interpreter present. Pt using toilet prior to leaving room performing toilet transfers at supervision level. Pt ambulated to ortho gym CGA with intermittent close supervision with PTA requiring to provide cues for R object negotiation. Participated in car transfer supervision then ambulating to ADL apt. Pt refusing to get in bed but was able to perform furniture transfer with supervision. Pt ambulated to rehab gym and participated in ascending/descending 6in steps x 12 with B rails and CGA. Pt performed in step to pattern. Performed additional functional activities as noted in functional tab. Pt also participated in gait training weaving through cones which pt was able to perform with CGA and safely weave through cones. Attempted to perform side stepping and backwards walking which pt would complete steps then stop and refuse to take additional steps. Pt noted to be more easily distracted once POA arrived and required increased cues to follow directions. PTA asked if pt could find NuStep which pt was able to find without cues. Pt participated in NuStep L5 with PTA instructing pt to stop at . As 5 min approached pt continued when PTA asked via interpreter at what time to stop pt indicated 15-20 min. PTA advised to stop at 7 min and required PTA to physically stop NuStep at . Pt ambulated back to room CGA and returned to recliner with call bell within reach and POA.     Therapy Documentation Precautions:  Precautions Precautions: Fall Precaution Comments: deaf  Restrictions Weight Bearing Restrictions:  No General:   Vital Signs: Therapy Vitals Pulse Rate: (!) 108 Resp: 18 BP: (!) 144/91(RN notified) Patient Position (if appropriate): Sitting Oxygen Therapy SpO2: 96 % O2 Device: Room Air Pain:   Mobility: Bed Mobility Bed Mobility: Sit to Supine;Supine to Sit Supine to Sit: Independent with assistive device Sit to Supine: Independent with assistive device Transfers Transfers: Sit to Stand Sit to Stand: Supervision/Verbal cueing Locomotion : Gait Ambulation: Yes Gait Assistance: Supervision/Verbal cueing(freqent min assist to correct LOB) Gait Distance (Feet): 150 Feet Assistive device: None Gait Gait: Yes Gait Pattern: (decreased weight shifting R, decreased RUE reciprocal arm swing, decreased gait speed, wide BOS) Stairs / Additional Locomotion Stairs: Yes Stairs Assistance: Contact Guard/Touching assist Stair Management Technique: Two rails Number of Stairs: 12 Wheelchair Mobility Wheelchair Mobility: No  Trunk/Postural Assessment : Thoracic Assessment Thoracic Assessment: Exceptions to WFL(kyphosis) Lumbar Assessment Lumbar Assessment: Exceptions to WFL(posterior pelvic tilt) Postural Control Postural Control: Deficits on evaluation Righting Reactions: impaired Protective Responses: impaired  Balance: Balance Balance Assessed: Yes Dynamic Standing Balance Dynamic Standing - Balance Support: No upper extremity supported Dynamic Standing - Level of Assistance: 5: Stand by assistance Dynamic Standing - Balance Activities: (during gait or hand hygiene at sink) Exercises:   Other Treatments:     See Function Navigator for Current Functional Status.   Therapy/Group: Individual Therapy  Laura [name] 07/25/2018, 4:27 PM

## 2018-07-25 NOTE — Progress Notes (Signed)
Social Work Patient ID: Laura [name], female   DOB: 07-21-1941, 77 y.o.   MRN: [id number] Met with pt and her POA to discuss discharge tomorrow and the needs. Asked if any pref for home health agency. [name]-POA wanted this worker to contact [facility] to see who is available to interpret and which agencies provide this service. Will contact home health agencies also. She will also need a tub bench and is aware a private pay item. Will make arrangements for tomorrow. [name]-[facility] to be here around 11:00 tomorrow to transport pt home.

## 2018-07-25 NOTE — Progress Notes (Signed)
Subjective/Complaints:  Hx autism, deaf/mute, sign language interpreter at beside  ROS: Patient denies CP, SOB, N/V/D, + cough, sniffling   Objective: Vital Signs: Blood pressure (!) 160/90, pulse 85, temperature 98.1 F (36.7 C), temperature source Oral, resp. rate 17, height 5' 3"  (1.6 m), weight 59.2 kg, SpO2 97 %. No results found. Results for orders placed or performed during the hospital encounter of 07/19/18 (from the past 72 hour(s))  CBC     Status: None   Collection Time: 07/25/18  5:03 AM  Result Value Ref Range   WBC 8.7 4.0 - 10.5 K/uL   RBC 4.46 3.87 - 5.11 MIL/uL   Hemoglobin 13.4 12.0 - 15.0 g/dL   HCT 41.4 36.0 - 46.0 %   MCV 92.8 78.0 - 100.0 fL   MCH 30.0 26.0 - 34.0 pg   MCHC 32.4 30.0 - 36.0 g/dL   RDW 12.3 11.5 - 15.5 %   Platelets 309 150 - 400 K/uL    Comment: Performed at [facility] [address], [place], [place] [address]  Comprehensive metabolic panel     Status: Abnormal   Collection Time: 07/25/18  5:03 AM  Result Value Ref Range   Sodium 136 135 - 145 mmol/L   Potassium 4.3 3.5 - 5.1 mmol/L   Chloride 103 98 - 111 mmol/L   CO2 26 22 - 32 mmol/L   Glucose, Bld 153 (H) 70 - 99 mg/dL   BUN 26 (H) 8 - 23 mg/dL   Creatinine, Ser 0.77 0.44 - 1.00 mg/dL   Calcium 8.3 (L) 8.9 - 10.3 mg/dL   Total Protein 6.7 6.5 - 8.1 g/dL   Albumin 3.2 (L) 3.5 - 5.0 g/dL   AST 27 15 - 41 U/L   ALT 28 0 - 44 U/L   Alkaline Phosphatase 61 38 - 126 U/L   Total Bilirubin 0.5 0.3 - 1.2 mg/dL   GFR calc non Af Amer >60 >60 mL/min   GFR calc Af Amer >60 >60 mL/min    Comment: (NOTE) The eGFR has been calculated using the CKD EPI equation. This calculation has not been validated in all clinical situations. eGFR's persistently <60 mL/min signify possible Chronic Kidney Disease.    Anion gap 7 5 - 15    Comment: Performed at [facility] [address] [place], [place] [address]     Constitutional: No distress . Vital signs reviewed. HEENT:  EOMI, oral membranes moist Neck: supple Cardiovascular: RRR without murmur. No JVD    Respiratory: CTA Bilaterally without wheezes or rales. Normal effort    GI: BS +, non-tender, non-distended  Neuro: Abnormal Sensory cannot assess secondary to cognitive deficits and communication deficits, Abnormal Motor 5/5 RUE, 5/5 LUE and BLE and Abnormal FMC Ataxic/ dec FMC Musc/Skel: Normal range of motion Psych: Pleasant and cooperative   Assessment/Plan: 1. 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 Physiatrist is providing close team supervision and 24 hour management of active medical problems listed below. Physiatrist and rehab team continue to assess barriers to discharge/monitor patient progress toward functional and medical goals. FIM: Function - Bathing Position: Shower Body parts bathed by patient: Right arm, Left arm, Chest, Abdomen, Front perineal area, Buttocks, Right upper leg, Left upper leg, Right lower leg, Left lower leg, Back Assist Level: Touching or steadying assistance(Pt > 75%)  Function- Upper Body Dressing/Undressing What is the patient wearing?: Bra, Pull  over shirt/dress Bra - Perfomed by patient: Thread/unthread right bra strap, Thread/unthread left bra strap, Hook/unhook bra (pull down sports bra) Pull over shirt/dress - Perfomed by patient: Thread/unthread right sleeve, Thread/unthread left sleeve, Put head through opening, Pull shirt over trunk Assist Level: Set up, Supervision or verbal cues Set up : To obtain clothing/put away Function - Lower Body Dressing/Undressing What is the patient wearing?: Underwear, Pants, Socks, Shoes Position: Other (comment)(sitting on toilet) Underwear - Performed by patient: Thread/unthread right underwear leg, Thread/unthread left underwear leg, Pull underwear up/down Underwear - Performed by helper: Thread/unthread right  underwear leg Pants- Performed by patient: Thread/unthread right pants leg, Thread/unthread left pants leg, Pull pants up/down Pants- Performed by helper: Thread/unthread right pants leg Socks - Performed by patient: Don/doff right sock, Don/doff left sock Socks - Performed by helper: Don/doff right sock, Don/doff left sock Shoes - Performed by patient: Don/doff right shoe, Don/doff left shoe Shoes - Performed by helper: Don/doff right shoe, Fasten right Assist for footwear: Partial/moderate assist Assist for lower body dressing: Touching or steadying assistance (Pt > 75%)  Function - Toileting Toileting steps completed by patient: Adjust clothing prior to toileting, Performs perineal hygiene, Adjust clothing after toileting Assist level: Supervision or verbal cues  Function - Air cabin crew transfer assistive device: Grab bar Assist level to toilet: Touching or steadying assistance (Pt > 75%) Assist level from toilet: Touching or steadying assistance (Pt > 75%)  Function - Chair/bed transfer Chair/bed transfer method: Ambulatory Chair/bed transfer assist level: Supervision or verbal cues Chair/bed transfer assistive device: Armrests Chair/bed transfer details: Verbal cues for precautions/safety, Visual cues/gestures for precautions/safety(pt ambulates at a fast pace and mod right inattention)  Function - Locomotion: Wheelchair Will patient use wheelchair at discharge?: No Wheelchair activity did not occur: Safety/medical concerns Wheel 50 feet with 2 turns activity did not occur: Safety/medical concerns Wheel 150 feet activity did not occur: Safety/medical concerns Function - Locomotion: Ambulation Assistive device: No device Max distance: 48f Assist level: Touching or steadying assistance (Pt > 75%) Assist level: Touching or steadying assistance (Pt > 75%) Assist level: Touching or steadying assistance (Pt > 75%) Walk 150 feet activity did not occur: Safety/medical  concerns Assist level: Touching or steadying assistance (Pt > 75%) Walk 10 feet on uneven surfaces activity did not occur: Safety/medical concerns Assist level: Touching or steadying assistance (Pt > 75%)  Function - Comprehension Comprehension: Visual Comprehension assistive device: Other(writing board, sign language, interpreter) Comprehension assist level: Understands basic 75 - 89% of the time/ requires cueing 10 - 24% of the time  Function - Expression Expression: Nonverbal Expression assistive device: Other (Comment)(sign language, interpreter, writing board) Expression assist level: Expresses basic 75 - 89% of the time/requires cueing 10 - 24% of the time. Needs helper to occlude trach/needs to repeat words., Expresses basic 90% of the time/requires cueing < 10% of the time.  Function - Social Interaction Social Interaction assist level: Interacts appropriately 50 - 74% of the time - May be physically or verbally inappropriate.  Function - Problem Solving Problem solving assist level: Solves basic 75 - 89% of the time/requires cueing 10 - 24% of the time  Function - Memory Memory assist level: Recognizes or recalls 50 - 74% of the time/requires cueing 25 - 49% of the time Patient normally able to recall (first 3 days only): Current season, That he or she is in a hospital  Medical Problem List and Plan:  1. Right sided weakness and cognitive deficits secondary to Left ACA stroke.  -  CIR PT, OT, SLP-still not at cognitive baseline per caregiver-also seems to be having increased problem with sequencing, per caregiver sleep has been ok ,  labwork normal this am 2. DVT Prophylaxis/Anticoagulation: Pharmaceutical: Lovenox  3. Pain Management: N/A  4. Mood: Maintain consistent schedule/routine. LCSW to follow for assistance,  5. Neuropsych: This patient is not capable of making decisions on her own behalf.  6. Skin/Wound Care: routine pressure relief measures.  7.  Fluids/Electrolytes/Nutrition: Monitor I/O.   Improving p.o. intake.  BMET normal 9/2  8. HTN: Monitor bid --avoid hypotension.  Vitals:   07/24/18 1942 07/25/18 0612  BP: (!) 149/77 (!) 160/90  Pulse: 88 85  Resp: 19 17  Temp: 97.6 F (36.4 C) 98.1 F (36.7 C)  SpO2: 97%   permissive for now, had extension due to low BP, keep in the 140-160 range for another 1 wks- f/u as OP with PCP 9. Hypothyroid: On supplement.  10. RLS/Anxiety disorder: used Klonopin prn. Currently refusing use.  11. Seizure disorder post ICH: On Keppra bid. 12.  Hypoalb - add pro stat 13.  Given some decline in therapy will ask fo rUA Cand S, poor historian 14.  Cough appear nasal congestion will add claritin LOS (Days) 6 A FACE TO FACE EVALUATION WAS PERFORMED  Charlett [name] 07/25/2018, 9:40 AM

## 2018-07-25 NOTE — Progress Notes (Addendum)
Physical Therapy Discharge Summary  Patient Details  Name: Laura [name] MRN: [id number] Date of Birth: [birth year]  Today's Date: 07/25/2018 PT Individual Time: 5038-8828 PT Individual Time Calculation (min): 69 min    Patient has met 8 of 10 long term goals due to improved activity tolerance, improved balance, improved postural control, increased strength and ability to compensate for deficits.  Patient to discharge at an ambulatory level supervision without AD.   Patient's care partner is independent to provide the necessary physical and cognitive assistance at discharge.  Reasons goals not met: pt requires min assist for floor transfer 2/2 impaired cognition & physical weakness, requires CGA for stair negotiation with B rails  Recommendation:  Patient will benefit from ongoing skilled PT services after returning to ILF with 24 hr supervision from personally hired sitters to continue to advance safe functional mobility, address ongoing impairments in dynamic balance, safety awareness, R inattention, endurance/strength, gait, R NMR and minimize fall risk.  Equipment: No equipment provided  Reasons for discharge: treatment goals met and discharge from hospital  Caregiver agrees with progress made and goals achieved: Yes   Skilled PT Treatment: Pt received in recliner with POA Laura [name]) present & professional interpreter arriving shortly after. Educated Laura [name] on recommendation of no AD as pt is more unsafe with rollator, as well as caregiver positioning when ambulating alongside pt. Educated her on HHPT f/u recommendations and Laura [name] has already coordinated sitters to provide 24 hr care to pt. Pt uses restroom with continent void and significantly extra time. Pt completed peri hygiene and managed clothing with supervision. Pt performed hand hygiene standing at sink with close supervision. Pt ambulates around unit without AD and very close supervision, with frequent min assist to correct LOB 2/2  R inattention and overall impaired attention to task. Pt completed bed mobility in apartment with mod I and floor transfer with min assist. Pt appears to have impaired initiation or problem solving of task and after prolonged time therapist provides assist for LE placement then pt is able to return to sitting on mat. Pt utilized nu-step on level 2 x 10 minutes with all four extremities with task focusing on coordination of reciprocal movements and endurance training. Pt able to terminate task appropriately after 10 minutes without cuing. Pt utilized cybex kinetron in sitting with cuing for task and max cuing to prevent pt from attempting to stand without machine being in safe position. Laura [name] reports no questions or concerns regarding pt's d/c tomorrow. At end of session pt left sitting in recliner in room with Laura [name] present to supervise.   Pt denied c/o pain during session. POA reports concern as she feels pt demonstrates delayed or increased processing time but reports MD is aware.   PT Discharge Precautions/Restrictions Precautions Precautions: Fall Precaution Comments: deaf  Restrictions Weight Bearing Restrictions: No  Vision/Perception  Pt wears glasses at all times. Hx of cataract removal. 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    Cognition Overall Cognitive Status: History of cognitive impairments - at baseline Arousal/Alertness: Awake/alert Orientation Level: Oriented to person Attention: Sustained Focused Attention: Appears intact Sustained Attention: Impaired Sustained Attention Impairment: Functional basic Memory: Impaired Memory Impairment: Decreased recall of new information;Decreased short term memory Awareness: Impaired Awareness Impairment: Intellectual impairment;Emergent impairment;Anticipatory impairment Problem Solving: Impaired Problem Solving Impairment: Functional basic Behaviors: Impulsive Safety/Judgment: Impaired    Sensation Sensation Light Touch: Not tested(unable to assess 2/2 cognitive deficits) Proprioception: Not tested(unable to assess 2/2 cognitive deficits) Coordination Gross Motor  Movements are Fluid and Coordinated: No  Motor  Motor Motor: Hemiplegia Motor - Discharge Observations: R hemiplegia, generalized weakness   Mobility Bed Mobility Bed Mobility: Sit to Supine;Supine to Sit Supine to Sit: Independent with assistive device Sit to Supine: Independent with assistive device Transfers Transfers: Sit to Stand Sit to Stand: Supervision/Verbal cueing Stand to Sit: Supervision/Verbal cueing Transfer (Assistive device): None  Locomotion  Gait Ambulation: Yes Gait Assistance: Supervision/Verbal cueing(freqent min assist to correct LOB) Gait Distance (Feet): 150 Feet Assistive device: None Gait Gait: Yes Gait Pattern: (decreased weight shifting R, decreased RUE reciprocal arm swing, decreased gait speed, wide BOS, trunk with posterior lean) Stairs / Additional Locomotion Stairs: Yes Stairs Assistance: Contact Guard/Touching assist Stair Management Technique: Two rails Number of Stairs: 12 Wheelchair Mobility Wheelchair Mobility: No   Trunk/Postural Assessment  Cervical Assessment Cervical Assessment: Exceptions to WFL(head flexed forward) Thoracic Assessment Thoracic Assessment: Exceptions to WFL(kyphosis) Lumbar Assessment Lumbar Assessment: Exceptions to WFL(posterior pelvic tilt) Postural Control Postural Control: Deficits on evaluation Righting Reactions: impaired Protective Responses: impaired   Balance Balance Balance Assessed: Yes Dynamic Standing Balance Dynamic Standing - Balance Support: No upper extremity supported Dynamic Standing - Level of Assistance: 5: Stand by assistance Dynamic Standing - Balance Activities: (during gait or hand hygiene at sink)  Extremity Assessment  RLE Assessment RLE Assessment: Within Functional Limits LLE  Assessment LLE Assessment: Within Functional Limits   See Function Navigator for Current Functional Status.  Laura [name] 07/25/2018, 3:51 PM

## 2018-07-25 NOTE — Progress Notes (Signed)
Occupational Therapy Discharge Summary  Patient Details  Name: Laura [name] MRN: [id number] Date of Birth: 03-Apr-1941  Today's Date: 07/25/2018 OT Individual Time: 1045-1140 OT Individual Time Calculation (min): 55 min  and Today's Date: 07/25/2018 OT Missed Time: 20 Minutes Missed Time Reason: Patient unwilling/refused to participate without medical reason   Patient has met 9 of 9 long term goals due to improved activity tolerance, improved balance, ability to compensate for deficits, functional use of  RIGHT upper extremity and improved coordination.  Patient to discharge at overall Supervision level.  Patient's care partner is independent to provide the necessary physical and cognitive assistance at discharge.  Pt has made great progress in the strength/coordination of her RUE, allowing her to return to her leisure activities of choice. Pt remains a fall risk and will require close (S) during any functional mobility or ADL transfer upon d/c.   Reasons goals not met: All LTG met, but pt still experiencing attention and initiation deficits that caregiver reports are not at baseline.   Recommendation:  Patient will benefit from ongoing skilled OT services in home health setting to continue to advance functional skills in the area of BADL and Reduce care partner burden.  Equipment: tub transfer bench  Reasons for discharge: treatment goals met and discharge from hospital  Patient/family agrees with progress made and goals achieved: Yes  Skilled OT Intervention: Pt received sitting up in recliner with sign interpreter, POA, and sitter all present throughout entire session. POA reporting that today pt is slower in processing, more distracted, and following less directions than baseline. Pt completed 300 ft total of functional mobility throughout session with CGA-(S) level assist. When walking on dynamic surface pt experienced 1 LOB that she correct with wall support. Pt required frequent  multimodal cueing throughout session to follow directions and for attention to task when not in room. When in room and distractions minimized pt able to stand and complete word puzzle with sustained attention for 20 minutes with no LOB. Extensive education provided to pt's sitters and POA re identifying stroke warning signs with residual deficits. Pt performed tub transfer using TTB with (S) and increased time. Pt became unwilling to complete any other tasks when her lunch arrived so session was terminated early. Pt left in recliner with POA and sitter present.  No s/s of pain throughout session.    OT Discharge Precautions/Restrictions  Precautions Precautions: Fall Precaution Comments: deaf  Restrictions Weight Bearing Restrictions: No General OT Amount of Missed Time: 20 Minutes  Pain Pain Assessment Pain Scale: Faces Faces Pain Scale: No hurt ADL ADL ADL Comments: See functional navigator Vision Baseline Vision/History: Wears glasses;Cataracts Wears Glasses: At all times Patient Visual Report: Other (comment)(unable to determine change in baseline, able to see smalll words in word puzzles, 16 in from face) Vision Assessment?: Vision impaired- to be further tested in functional context Perception  Perception: Impaired Inattention/Neglect: Does not attend to right visual field Topographical Orientation: Impaired, required vc for finding room Praxis Praxis: Impaired Praxis Impairment Details: Motor planning;Perseveration Cognition Overall Cognitive Status: History of cognitive impairments - at baseline Arousal/Alertness: Awake/alert Orientation Level: Oriented to person;Oriented to place Attention: Sustained Focused Attention: Appears intact Sustained Attention: Impaired Sustained Attention Impairment: Functional basic Memory: Impaired Memory Impairment: Decreased recall of new information Awareness Impairment: Intellectual impairment;Emergent impairment Problem Solving:  Impaired Problem Solving Impairment: Functional basic Executive Function: Sequencing;Initiating Sequencing: Impaired Sequencing Impairment: Functional basic Initiating: Impaired Initiating Impairment: Functional basic Behaviors: Impulsive Safety/Judgment: Impaired Sensation Sensation Light Touch:  Not tested(unable to assess 2/2 cognition) Coordination Gross Motor Movements are Fluid and Coordinated: No Fine Motor Movements are Fluid and Coordinated: Yes Coordination and Movement Description: Pt able to functionally use pencil in R UE Motor  Motor Motor: Hemiplegia Motor - Skilled Clinical Observations: R hemi, generalized weakness Mobility  Bed Mobility Bed Mobility: Supine to Sit;Sit to Supine Supine to Sit: Supervision/Verbal cueing Sit to Supine: Supervision/Verbal cueing Transfers Sit to Stand: Supervision/Verbal cueing Stand to Sit: Supervision/Verbal cueing  Trunk/Postural Assessment  Cervical Assessment Cervical Assessment: Exceptions to WFL(head flexed forward) Thoracic Assessment Thoracic Assessment: Exceptions to WFL(kyphotic in standing) Lumbar Assessment Lumbar Assessment: Exceptions to WFL(posterior pelvic tilt in standing/walking) Postural Control Postural Control: Deficits on evaluation Righting Reactions: delayed Protective Responses: delayed  Balance Balance Balance Assessed: Yes Static Sitting Balance Static Sitting - Balance Support: Feet supported;No upper extremity supported Static Sitting - Level of Assistance: 6: Modified independent (Device/Increase time) Static Sitting - Comment/# of Minutes: while completing word puzzle Dynamic Sitting Balance Dynamic Sitting - Balance Support: Feet supported Dynamic Sitting - Level of Assistance: 6: Modified independent (Device/Increase time) Dynamic Sitting - Balance Activities: Reaching for objects Static Standing Balance Static Standing - Balance Support: No upper extremity supported;During functional  activity Static Standing - Level of Assistance: 5: Stand by assistance Dynamic Standing Balance Dynamic Standing - Balance Support: No upper extremity supported;During functional activity Dynamic Standing - Level of Assistance: 5: Stand by assistance Dynamic Standing - Balance Activities: Other (comment)(during gait ) Extremity/Trunk Assessment RUE Assessment RUE Assessment: Exceptions to [facility] Passive Range of Motion (PROM) Comments: WFL Active Range of Motion (AROM) Comments: WFL General Strength Comments: grossly 4-/5 LUE Assessment LUE Assessment: Within Functional Limits   See Function Navigator for Current Functional Status.  Curtis Sites 07/25/2018, 12:06 PM

## 2018-07-26 LAB — URINE CULTURE

## 2018-07-26 MED ORDER — ASPIRIN 325 MG PO TBEC: 325.0000 mg | DELAYED_RELEASE_TABLET | Freq: Every day | ORAL | 0 refills | Status: AC

## 2018-07-26 MED ORDER — CLONAZEPAM 1 MG PO TABS: 0.5000 mg | ORAL_TABLET | Freq: Three times a day (TID) | ORAL | 2 refills | Status: DC | PRN

## 2018-07-26 MED ORDER — CLOPIDOGREL BISULFATE 75 MG PO TABS: 75.0000 mg | ORAL_TABLET | Freq: Every day | ORAL | 0 refills | Status: DC

## 2018-07-26 MED ORDER — TRIAMCINOLONE ACETONIDE 0.1 % EX CREA: TOPICAL_CREAM | CUTANEOUS | 2 refills | Status: DC

## 2018-07-26 MED ORDER — LORATADINE 10 MG PO TABS: 10.0000 mg | ORAL_TABLET | Freq: Every day | ORAL | Status: AC | PRN

## 2018-07-26 NOTE — Discharge Instructions (Signed)
Inpatient Rehab Discharge Instructions  Laura [name] Discharge date and time:  07/26/18  Activities/Precautions/ Functional Status: Activity: No driving, or strenuous exercise till cleared by MD Diet: cardiac diet and diabetic diet Wound Care: none needed    Functional status:  ___ No restrictions     ___ Walk up steps independently _X__ 24/7 supervision/assistance   ___ Walk up steps with assistance ___ Intermittent supervision/assistance  ___ Bathe/dress independently ___ Walk with walker     _X__ Bathe/dress with assistance ___ Walk Independently    ___ Shower independently ___ Walk with assistance    ___ Shower with assistance __X_ No alcohol     ___ Return to work/school ________   Special Instructions: 1. Family to assist with all activity and medication management.  2.  Stop taking aspirin after 10/18/18  COMMUNITY REFERRALS UPON DISCHARGE:    Home Health:   PT & OT  Agency:[facility] Phone:[phone]   Date of last service:07/26/2018  Medical Equipment/Items Ordered:TUB BENCH  Agency/Supplier:[facility]   [phone] Other:POA-[name] HAS HIRED PRIVATE DUTY CAREGIVERS TO PROVIDE 24 HR SUPERVISION IN PT'S APARTMENT    STROKE/TIA DISCHARGE INSTRUCTIONS SMOKING Cigarette smoking nearly doubles your risk of having a stroke & is the single most alterable risk factor  If you smoke or have smoked in the last 12 months, you are advised to quit smoking for your health.  Most of the excess cardiovascular risk related to smoking disappears within a year of stopping.  Ask you doctor about anti-smoking medications  [place] Quit Line: 1-800-QUIT NOW  Free Smoking Cessation Classes [phone]  CHOLESTEROL Know your levels; limit fat & cholesterol in your diet  Lipid Panel     Component Value Date/Time   CHOL 268 (H) 07/16/2018 0341   TRIG 291 (H) 07/16/2018 0341   HDL 42 07/16/2018 0341   CHOLHDL 6.4 07/16/2018 0341   VLDL 58 (H) 07/16/2018 0341   LDLCALC 168 (H) 07/16/2018 0341      Many patients benefit from treatment even if their cholesterol is at goal.  Goal: Total Cholesterol (CHOL) less than 160  Goal:  Triglycerides (TRIG) less than 150  Goal:  HDL greater than 40  Goal:  LDL (LDLCALC) less than 100   BLOOD PRESSURE American Stroke Association blood pressure target is less that 120/80 mm/Hg  Your discharge blood pressure is:  BP: (!) 150/89  Monitor your blood pressure  Limit your salt and alcohol intake  Many individuals will require more than one medication for high blood pressure  DIABETES (A1c is a blood sugar average for last 3 months) Goal HGBA1c is under 7% (HBGA1c is blood sugar average for last 3 months)  Diabetes:     Lab Results  Component Value Date   HGBA1C 6.7 (H) 07/16/2018     Your HGBA1c can be lowered with medications, healthy diet, and exercise.  Check your blood sugar as directed by your physician  Call your physician if you experience unexplained or low blood sugars.  PHYSICAL ACTIVITY/REHABILITATION Goal is 30 minutes at least 4 days per week  Activity: No driving, Therapies: see above Return to work: N/A  Activity decreases your risk of heart attack and stroke and makes your heart stronger.  It helps control your weight and blood pressure; helps you relax and can improve your mood.  Participate in a regular exercise program.  Talk with your doctor about the best form of exercise for you (dancing, walking, swimming, cycling).  DIET/WEIGHT Goal is to  maintain a healthy weight  Your discharge diet is:  Diet Order            Diet heart healthy/carb modified Room service appropriate? Yes; Fluid consistency: Thin  Diet effective now             liquids Your height is:  Height: 5\' 3"  (160 cm) Your current weight is: Weight: 59.2 kg Your Body Mass Index (BMI) is:  BMI (Calculated): 23.12  Following the type of diet specifically designed for you will help prevent another  stroke.  You are at goal weight.   Your goal Body Mass Index (BMI) is 19-24.  Healthy food habits can help reduce 3 risk factors for stroke:  High cholesterol, hypertension, and excess weight.  RESOURCES Stroke/Support Group:  Call [phone]   STROKE EDUCATION PROVIDED/REVIEWED AND GIVEN TO PATIENT Stroke warning signs and symptoms How to activate emergency medical system (call 911). Medications prescribed at discharge. Need for follow-up after discharge. Personal risk factors for stroke. Pneumonia vaccine given:  Flu vaccine given:  My questions have been answered, the writing is legible, and I understand these instructions.  I will adhere to these goals & educational materials that have been provided to me after my discharge from the hospital.     My questions have been answered and I understand these instructions. I will adhere to these goals and the provided educational materials after my discharge from the hospital.  Patient/Caregiver Signature _______________________________ Date __________  Clinician Signature _______________________________________ Date __________  Please bring this form and your medication list with you to all your follow-up doctor's appointments.

## 2018-07-26 NOTE — Progress Notes (Signed)
Social Work  Discharge Note  The overall goal for the admission was met for:   Discharge location: Yes-BACK TO [facility] INDEPENDENT APARTMENT WITH 24 HR CAREGIVERS  Length of Stay: Yes-7 DAYS  Discharge activity level: Yes-SUPERVISION WITH CUEING  Home/community participation: Yes  Services provided included: MD, RD, PT, OT, SLP, RN, CM, Pharmacy and SW  Financial Services: Medicare and Private Insurance: [employer]  Follow-up services arranged: Home Health: [facility]-PT & OT ALONG WITH TUB BENCH and Patient/Family has no preference for HH/DME agencies  Comments (or additional information):[name] HAS HIRED CAREGIVERS TO BE WITH PT 24 HR AND PROVIDE CARE. PLANS TO GO BACK TO HER INDEPENDENT APARTMENT WITH THEM. [facility] AWARE PT WILL NEED SIGN INTERPRETER  Patient/Family verbalized understanding of follow-up arrangements: Yes  Individual responsible for coordination of the follow-up plan: [name]-POA  Confirmed correct DME delivered: Laura [name], Laura [name] 07/26/2018    Laura [name], Laura [name] 

## 2018-07-26 NOTE — Progress Notes (Signed)
Discharge instructions reviewed with patient by Marissa Nestle PA, patient left floor via wheelchair accompanied by staff and family.  Shey Yott, Kae Heller, RN

## 2018-07-26 NOTE — Plan of Care (Signed)
  Problem: Consults Goal: RH STROKE PATIENT EDUCATION Description See Patient Education module for education specifics  Outcome: Completed/Met Goal: Nutrition Consult-if indicated Outcome: Completed/Met   Problem: RH BLADDER ELIMINATION Goal: RH STG MANAGE BLADDER WITH ASSISTANCE Description STG Manage Bladder With mod  Assistance  Outcome: Completed/Met   Problem: RH SKIN INTEGRITY Goal: RH STG SKIN FREE OF INFECTION/BREAKDOWN Description Patient will have no breakdown of skin at discharge.  Outcome: Completed/Met   Problem: RH SAFETY Goal: RH STG ADHERE TO SAFETY PRECAUTIONS W/ASSISTANCE/DEVICE Description STG Adhere to Safety Precautions With min  Assistance/Device.  Outcome: Completed/Met Goal: RH STG DECREASED RISK OF FALL WITH ASSISTANCE Description STG Decreased Risk of Fall With  [employer].  Outcome: Completed/Met   Problem: RH COGNITION-NURSING Goal: RH STG USES MEMORY AIDS/STRATEGIES W/ASSIST TO PROBLEM SOLVE Description STG Uses Memory Aids/Strategies With max Assistance to Problem Solve.  Outcome: Completed/Met Goal: RH STG ANTICIPATES NEEDS/CALLS FOR ASSIST W/ASSIST/CUES Description STG Anticipates Needs/Calls for Assist With Assistance/Cues. Outcome: Completed/Met   Problem: RH PAIN MANAGEMENT Goal: RH STG PAIN MANAGED AT OR BELOW PT'S PAIN GOAL Description Less than 2   Outcome: Completed/Met   Problem: RH KNOWLEDGE DEFICIT Goal: RH STG INCREASE KNOWLEDGE OF HYPERTENSION Description POA will be able to verbalize understanding of hypertension at discharge  Outcome: Completed/Met Goal: RH STG INCREASE KNOWLEGDE OF HYPERLIPIDEMIA Description POA will be able to verbalize understanding of high cholesterol  Outcome: Completed/Met Goal: RH STG INCREASE KNOWLEDGE OF STROKE PROPHYLAXIS Description POA will be able to verbalize understanding of signs and symptoms of stroke at discharge.  Outcome: Completed/Met

## 2018-07-26 NOTE — Discharge Summary (Signed)
Physician Discharge Summary  Patient ID: Laura [name] MRN: [id number] DOB/AGE: 26-Aug-1941 77 y.o.  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) Active Problems:   Essential hypertension   Hyperglycemia   Bradycardia   Autism spectrum disorder   Discharged Condition: stable   Significant Diagnostic Studies: N/A   Labs:  Basic Metabolic Panel: BMP Latest Ref Rng & Units 07/25/2018 07/20/2018 07/19/2018  Glucose 70 - 99 mg/dL 409(W) 119(J) 478(G)  BUN 8 - 23 mg/dL 95(A) 12 15  Creatinine 0.44 - 1.00 mg/dL 2.13 0.86 5.78  Sodium 135 - 145 mmol/L 136 138 139  Potassium 3.5 - 5.1 mmol/L 4.3 4.7 4.0  Chloride 98 - 111 mmol/L 103 103 105  CO2 22 - 32 mmol/L 26 29 27   Calcium 8.9 - 10.3 mg/dL 8.3(L) 8.3(L) 8.1(L)    CBC: Recent Labs  Lab 07/25/18 0503  WBC 8.7  HGB 13.4  HCT 41.4  MCV 92.8  PLT 309    CBG: No results for input(s): GLUCAP in the last 168 hours.   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  POA reported 2 weeks history of cognitive and language impairments at waxing and waning as well as confusion.  CT of head was negative for acute changes.  MRI brain done revealing cluster of small acute infarcts in the distal left PCA territory.  Bilateral lower extremity Dopplers were negative for DVT.  Hospital course significant for bradycardia and no PPM needed per input from Dr. Ladona Ridgel.  She did develop right-sided weakness on 8/26 felt to be due to hypoperfusion.  DAPT recommended for 3 months followed by Plavix alone as well as recommendations to avoid hypotension.  Therapy ongoing and CIR recommended due to decline in functional and cognitive abilities.   Hospital Course: Laura [name] was admitted to  rehab 07/19/2018 for inpatient therapies to consist of PT, ST and OT at least three hours five days a week. Past admission physiatrist, therapy team and rehab RN have worked together to provide customized collaborative inpatient rehab.  Blood pressures have been monitored on twice daily basis and medications not resumed to avoid hypotension.  Low-dose Klonopin was used at night times to help manage RLS symptoms.  Nutritional supplements were offered 3 times daily to help maintain adequate intake.  She was noted to have some decline prior to discharge and UA/urine culture done for work-up.  This was negative however check of LI TES showed evidence of AKI.  Family and sitter were educated on encouraging and increasing fluid intake. She has made great gains in mobility but continues to be at high fall risk due to left inattention as well as cognitive issues. She will continue to receive follow up HHPT and HHOT by [facility] after discharge.    Rehab course: During patient's stay in rehab weekly team conferences were held to monitor patient's progress, set goals and discuss barriers to discharge. At admission, patient required  He/She  has had improvement in activity tolerance, balance, postural control as well as ability to compensate for deficits.  She has had improvement in functional use RUE  and RLE as well as improvement in awareness. She is able to complete ADL tasks with supervision. She requires supervision with cues for transfers and is  able to ambulate 150' with supervision to min assist for LOB. Education was completed with family and sitters.    Disposition: Independent Living facility.   Diet: Heart healthy/Diabetic.   Special Instructions: 1. Needs contact guard assist to supervision with mobility and with cognitive tasks.  2. Stop taking ASA after 10/18/18.  3. Follow up with Dr. Karel Jarvis as scheduled.    Allergies as of 07/26/2018   No Known Allergies     Medication List     TAKE these medications   acetaminophen 325 MG tablet Commonly known as:  TYLENOL Take 1-2 tablets (325-650 mg total) by mouth every 4 (four) hours as needed for mild pain.   alendronate 70 MG tablet Commonly known as:  FOSAMAX TAKE 1 TABLET BY MOUTH ONCE A WEEK .  TAKE  WITH  A  FULL  GLASS  OF  WATER  ON  AN  EMPTY  STOMACH What changed:  See the new instructions.   aspirin 325 MG EC tablet Take 1 tablet (325 mg total) by mouth daily. STOP TAKING ASPIRIN AFTER 10/18/18 What changed:  additional instructions   atorvastatin 80 MG tablet Commonly known as:  LIPITOR Take 1 tablet (80 mg total) by mouth daily at 6 PM.   cholecalciferol 1000 units tablet Commonly known as:  VITAMIN D Take 1,000 Units by mouth daily.   clonazePAM 1 MG tablet Commonly known as:  KLONOPIN Take 0.5 tablets (0.5 mg total) by mouth 3 (three) times daily as needed (for tremors.). Take 1/2 pill at bedtime to help with restless leg syndrome. What changed:    how much to take  reasons to take this  additional instructions   clopidogrel 75 MG tablet Commonly known as:  PLAVIX Take 1 tablet (75 mg total) by mouth daily.   levETIRAcetam 500 MG tablet Commonly known as:  KEPPRA Take 1 tablet (500 mg total) by mouth 2 (two) times daily.   levothyroxine 50 MCG tablet Commonly known as:  SYNTHROID, LEVOTHROID Take 1 tablet (50 mcg total) by mouth daily.   loratadine 10 MG tablet Commonly known as:  CLARITIN Take 1 tablet (10 mg total) by mouth daily as needed for allergies.   multivitamin tablet Take 1 tablet by mouth daily.   triamcinolone cream 0.1 % Commonly known as:  KENALOG APPLY  CREAM EXTERNALLY THREE TIMES DAILY AS NEEDED FOR ITCHING      Follow-up Information    Kirsteins, Victorino Sparrow, MD Follow up.   Specialty:  Physical Medicine and Rehabilitation Why:  as needed Contact information: [address] [place] [address] [phone]        Romero Belling, MD Follow  up on 08/02/2018.   Specialty:  Endocrinology Why:  Appointment 1:45 pm Contact information: [address] [place] [address] [phone]           Signed: Jacquelynn Cree 07/28/2018, 1:53 PM

## 2018-07-28 NOTE — Telephone Encounter (Signed)
Defer this for now, I do not think we would get good data. Thanks

## 2018-07-29 ENCOUNTER — Emergency Department (HOSPITAL_COMMUNITY): Payer: Medicare Other

## 2018-07-29 ENCOUNTER — Encounter (HOSPITAL_COMMUNITY): Payer: Self-pay

## 2018-07-29 ENCOUNTER — Emergency Department (HOSPITAL_COMMUNITY)
Admission: EM | Admit: 2018-07-29 | Discharge: 2018-07-30 | Disposition: A | Discharge status: Home / Self Care | Payer: Medicare Other | Attending: Emergency Medicine | Admitting: Emergency Medicine

## 2018-07-29 ENCOUNTER — Other Ambulatory Visit: Payer: Self-pay

## 2018-07-29 DIAGNOSIS — I6350 Cerebral infarction due to unspecified occlusion or stenosis of unspecified cerebral artery: Secondary | ICD-10-CM | POA: Insufficient documentation

## 2018-07-29 DIAGNOSIS — F840 Autistic disorder: Secondary | ICD-10-CM | POA: Insufficient documentation

## 2018-07-29 DIAGNOSIS — W010XXA Fall on same level from slipping, tripping and stumbling without subsequent striking against object, initial encounter: Secondary | ICD-10-CM | POA: Insufficient documentation

## 2018-07-29 DIAGNOSIS — Y939 Activity, unspecified: Secondary | ICD-10-CM | POA: Diagnosis not present

## 2018-07-29 DIAGNOSIS — H913 Deaf nonspeaking, not elsewhere classified: Secondary | ICD-10-CM | POA: Insufficient documentation

## 2018-07-29 DIAGNOSIS — Y999 Unspecified external cause status: Secondary | ICD-10-CM | POA: Insufficient documentation

## 2018-07-29 DIAGNOSIS — R55 Syncope and collapse: Secondary | ICD-10-CM | POA: Diagnosis not present

## 2018-07-29 DIAGNOSIS — I10 Essential (primary) hypertension: Secondary | ICD-10-CM | POA: Diagnosis not present

## 2018-07-29 DIAGNOSIS — Y92000 Kitchen of unspecified non-institutional (private) residence as  the place of occurrence of the external cause: Secondary | ICD-10-CM | POA: Insufficient documentation

## 2018-07-29 DIAGNOSIS — F70 Mild intellectual disabilities: Secondary | ICD-10-CM | POA: Insufficient documentation

## 2018-07-29 DIAGNOSIS — E119 Type 2 diabetes mellitus without complications: Secondary | ICD-10-CM | POA: Diagnosis not present

## 2018-07-29 DIAGNOSIS — S0990XA Unspecified injury of head, initial encounter: Secondary | ICD-10-CM | POA: Insufficient documentation

## 2018-07-29 DIAGNOSIS — W19XXXA Unspecified fall, initial encounter: Secondary | ICD-10-CM | POA: Diagnosis not present

## 2018-07-29 DIAGNOSIS — R0902 Hypoxemia: Secondary | ICD-10-CM | POA: Diagnosis not present

## 2018-07-29 DIAGNOSIS — R404 Transient alteration of awareness: Secondary | ICD-10-CM | POA: Diagnosis not present

## 2018-07-29 DIAGNOSIS — S199XXA Unspecified injury of neck, initial encounter: Secondary | ICD-10-CM | POA: Diagnosis not present

## 2018-07-29 DIAGNOSIS — E039 Hypothyroidism, unspecified: Secondary | ICD-10-CM | POA: Diagnosis not present

## 2018-07-29 DIAGNOSIS — S065X0A Traumatic subdural hemorrhage without loss of consciousness, initial encounter: Secondary | ICD-10-CM | POA: Diagnosis not present

## 2018-07-29 DIAGNOSIS — R42 Dizziness and giddiness: Secondary | ICD-10-CM | POA: Diagnosis not present

## 2018-07-29 DIAGNOSIS — R410 Disorientation, unspecified: Secondary | ICD-10-CM | POA: Diagnosis not present

## 2018-07-29 LAB — CBC WITH DIFFERENTIAL/PLATELET
ABS IMMATURE GRANULOCYTES: 0.1 10*3/uL (ref 0.0–0.1)
BASOS ABS: 0 10*3/uL (ref 0.0–0.1)
Basophils Relative: 1 %
Eosinophils Absolute: 0.1 10*3/uL (ref 0.0–0.7)
Eosinophils Relative: 1 %
HCT: 43.6 % (ref 36.0–46.0)
HEMOGLOBIN: 14.2 g/dL (ref 12.0–15.0)
Immature Granulocytes: 2 %
Lymphocytes Relative: 14 %
Lymphs Abs: 1.2 10*3/uL (ref 0.7–4.0)
MCH: 30.3 pg (ref 26.0–34.0)
MCHC: 32.6 g/dL (ref 30.0–36.0)
MCV: 93 fL (ref 78.0–100.0)
MONO ABS: 0.8 10*3/uL (ref 0.1–1.0)
MONOS PCT: 10 %
Neutro Abs: 6.4 10*3/uL (ref 1.7–7.7)
Neutrophils Relative %: 74 %
PLATELETS: 338 10*3/uL (ref 150–400)
RBC: 4.69 MIL/uL (ref 3.87–5.11)
RDW: 12.2 % (ref 11.5–15.5)
WBC: 8.7 10*3/uL (ref 4.0–10.5)

## 2018-07-29 LAB — COMPREHENSIVE METABOLIC PANEL
ALT: 24 U/L (ref 0–44)
ANION GAP: 12 (ref 5–15)
AST: 29 U/L (ref 15–41)
Albumin: 3.6 g/dL (ref 3.5–5.0)
Alkaline Phosphatase: 72 U/L (ref 38–126)
BUN: 20 mg/dL (ref 8–23)
CHLORIDE: 98 mmol/L (ref 98–111)
CO2: 23 mmol/L (ref 22–32)
Calcium: 8.8 mg/dL — ABNORMAL LOW (ref 8.9–10.3)
Creatinine, Ser: 0.9 mg/dL (ref 0.44–1.00)
GFR calc non Af Amer: >60 mL/min (ref >60)
Glucose, Bld: 166 mg/dL — ABNORMAL HIGH (ref 70–99)
Potassium: 4.6 mmol/L (ref 3.5–5.1)
Sodium: 133 mmol/L — ABNORMAL LOW (ref 135–145)
TOTAL PROTEIN: 6.9 g/dL (ref 6.5–8.1)
Total Bilirubin: 0.9 mg/dL (ref 0.3–1.2)

## 2018-07-29 LAB — URINALYSIS, ROUTINE W REFLEX MICROSCOPIC
Bilirubin Urine: NEGATIVE
GLUCOSE, UA: NEGATIVE mg/dL
Hgb urine dipstick: NEGATIVE
KETONES UR: NEGATIVE mg/dL
LEUKOCYTES UA: NEGATIVE
Nitrite: NEGATIVE
PH: 7 (ref 5.0–8.0)
PROTEIN: NEGATIVE mg/dL
Specific Gravity, Urine: 1.006 (ref 1.005–1.030)

## 2018-07-29 LAB — I-STAT TROPONIN, ED: TROPONIN I, POC: 0.04 ng/mL (ref 0.00–0.08)

## 2018-07-29 MED ORDER — LACTATED RINGERS IV SOLN
INTRAVENOUS | Status: DC
  Administered 2018-07-29: 17:00:00 via INTRAVENOUS

## 2018-07-29 MED ORDER — LORAZEPAM 2 MG/ML IJ SOLN
0.5000 mg | Freq: Once | INTRAMUSCULAR | Status: AC
  Administered 2018-07-29: 0.5 mg via INTRAVENOUS
  Filled 2018-07-29: qty 1

## 2018-07-29 NOTE — ED Notes (Addendum)
To mri 

## 2018-07-29 NOTE — ED Notes (Signed)
Pt dressed and sitting at the bedside.

## 2018-07-29 NOTE — Discharge Instructions (Addendum)
You were seen in the ED today after a fall. Your CT and MRI head are negative for acute findings. Call your PCP on Monday. Return to the ED with any new or worsening symptoms.

## 2018-07-29 NOTE — ED Notes (Signed)
Patient transported to CT 

## 2018-07-29 NOTE — ED Provider Notes (Signed)
Emergency Department Provider Note   I have reviewed the triage vital signs and the nursing notes.   HISTORY  Chief Complaint Fall  Interview and exam performed with in-person sign language interpreter.   HPI Laura [name] is a 77 y.o. female with PMH of recent CVA, HLD, HTN, Autism, and hearing impairment presents to the ED after a fall.  Patient had a recent stroke and was discharged home.  The patient's caregivers, at bedside, states that she has been seeming slightly more confused over the past 2 days.  She has been very forgetful and sometimes staring as if not there.  No obvious seizure activity.  She had a fall today where she was standing with her walker and then fell to the right side.  Aide states she struck the back of her head on the right side.  No apparent loss of consciousness.  Patient denies any chest pain, shortness of breath, heart palpitations prior to falling.  She states that she was "just in shock" and was helped up afterwards.  She went back upstairs to eat thinking that she could be hungry but symptoms did not improve so she presented to the emergency department.  The patient's POA states that prior strokes have presented in a similar way with some bleeding confusion and staring followed by a fall.   Level 5 caveat: Autism spectrum   Past Medical History:  Diagnosis Date  . Anxiety state, unspecified 10/10/2007  . Arthritis   . ASYMPTOMATIC POSTMENOPAUSAL STATUS 04/15/2009  . Cataract    removed both eyes   . Deaf   . Dermatitis   . Headache(784.0) 08/27/2008  . HYPERLIPIDEMIA 06/22/2007   rx refused  . HYPERTENSION 05/07/2008  . HYPOTHYROIDISM 04/15/2009  . Mild mental retardation   . 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 RESTLESS LEG SYNDROME 10/01/2008  . TACHYCARDIA 08/27/2008  . TREMOR 11/21/2007  . TRIGGER FINGER 10/30/2009    Patient Active Problem List   Diagnosis Date Noted  . Acute ischemic left anterior cerebral artery (ACA) stroke (HCC)  07/19/2018  . History of CVA (cerebrovascular accident) without residual deficits   . Sinus tachycardia   . Diabetes mellitus type 2 in nonobese (HCC)   . Acute encephalopathy 07/14/2018  . Autism spectrum disorder 07/14/2018  . Deaf 07/14/2018  . Bradycardia 05/16/2018  . Cognitive changes 06/03/2017  . Anemia 05/18/2017  . CVA (cerebral vascular accident) (HCC) 04/06/2017  . Bleeding, intracranial (HCC)   . Sepsis (HCC)   . Subdural hematoma (HCC)   . Trauma   . Osteoporosis 03/23/2017  . Menopause 03/25/2016  . Hyperglycemia 08/07/2015  . Contusion of left chest wall 02/05/2014  . Routine general medical examination at a health care facility 01/19/2014  . Left ventricular hypertrophy 07/15/2011  . Encounter for Maxwell Martorano-term (current) use of other medications 06/29/2011  . 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 TRIGGER FINGER 10/30/2009  . Hypothyroidism 04/15/2009  . ABNORMAL ELECTROCARDIOGRAM 04/15/2009  . RESTLESS LEG SYNDROME 10/01/2008  . HEADACHE 08/27/2008  . TACHYCARDIA 08/27/2008  . Essential hypertension 05/07/2008  . Abnormal involuntary movement 11/21/2007  . ANXIETY STATE, UNSPECIFIED 10/10/2007  . Dyslipidemia 06/22/2007    Past Surgical History:  Procedure Laterality Date  . CATARACT EXTRACTION, BILATERAL    . COLONOSCOPY      Allergies Patient has no known allergies.  Family History  Problem Relation Age of Onset  . Thyroid disease Neg Hx   . Colon cancer Neg Hx   . Colon  polyps Neg Hx   . Rectal cancer Neg Hx   . Stomach cancer Neg Hx     Social History Social History   Tobacco Use  . Smoking status: Never Smoker  . Smokeless tobacco: Never Used  Substance Use Topics  . Alcohol use: No  . Drug use: No    Review of Systems  Constitutional: No fever/chills Eyes: No visual changes. ENT: No sore throat. Cardiovascular: Denies chest pain. Respiratory: Denies shortness of breath. Gastrointestinal: No abdominal pain.  No nausea, no  vomiting.  No diarrhea.  No constipation. Genitourinary: Negative for dysuria. Musculoskeletal: Negative for back pain. Skin: Negative for rash. Neurological: Negative for focal weakness or numbness. Positive HA.   10-point ROS otherwise negative.  ____________________________________________   PHYSICAL EXAM:  VITAL SIGNS: ED Triage Vitals  Enc Vitals Group     BP 07/29/18 1445 (!) 171/95     Pulse Rate 07/29/18 1445 85     Resp 07/29/18 1445 18     Temp --      Temp src --      SpO2 07/29/18 1445 97 %     Weight 07/29/18 1453 130 lb 8.2 oz (59.2 kg)     Height 07/29/18 1453 5\' 4"  (1.626 m)   Constitutional: Alert. Well appearing and in no acute distress. Eyes: Conjunctivae are normal. PERRL. Head: Scalp hematoma to the right, posterior scalp.  Nose: No congestion/rhinnorhea. Mouth/Throat: Mucous membranes are moist.  Neck: No stridor.  Cardiovascular: Normal rate, regular rhythm. Good peripheral circulation. Grossly normal heart sounds.   Respiratory: Normal respiratory effort.  No retractions. Lungs CTAB. Gastrointestinal: Soft and nontender. No distention.  Musculoskeletal: No lower extremity tenderness nor edema. No gross deformities of extremities. Neurologic: Patient is deaf but signing. Is having some difficulty with the right hand (baseline). 4+/5 strength in the RUE and  RLE.  Skin:  Skin is warm, dry and intact. No rash noted.  ____________________________________________   LABS (all labs ordered are listed, but only abnormal results are displayed)  Labs Reviewed  COMPREHENSIVE METABOLIC PANEL - Abnormal; Notable for the following components:      Result Value   Sodium 133 (*)    Glucose, Bld 166 (*)    Calcium 8.8 (*)    All other components within normal limits  URINALYSIS, ROUTINE W REFLEX MICROSCOPIC - Abnormal; Notable for the following components:   Color, Urine STRAW (*)    All other components within normal limits  CBC WITH DIFFERENTIAL/PLATELET    I-STAT TROPONIN, ED   ____________________________________________  EKG   EKG Interpretation  Date/Time:  Friday July 29 2018 15:14:04 EDT Ventricular Rate:  91 PR Interval:    QRS Duration: 111 QT Interval:  401 QTC Calculation: 494 R Axis:   -58 Text Interpretation:  Sinus rhythm Prolonged PR interval Left ventricular hypertrophy Inferior infarct, old Anterior infarct, old No STEMI.  Confirmed by Alona Bene [phone]) on 07/29/2018 3:16:51 PM Also confirmed by Alona Bene [phone]), editor Barbette Hair [phone])  on 07/29/2018 3:46:30 PM       ____________________________________________  RADIOLOGY  Dg Chest 2 View  Result Date: 07/29/2018 CLINICAL DATA:  Weakness and dizziness for the past day. History of hypertension. EXAM: CHEST - 2 VIEW COMPARISON:  Portable chest x-ray of July 14, 2018 and PA and lateral chest x-ray of February 05, 2014 FINDINGS: The lungs are well-expanded. There is no focal infiltrate nor pleural effusion. The cardiac silhouette is enlarged. The pulmonary vascularity is normal. The mediastinum is  normal in width. The bony thorax exhibits no acute abnormality. IMPRESSION: Mild enlargement of the cardiac silhouette since the March 2015 study. No pulmonary edema or pneumonia. Electronically Signed   By: David  [place] M.D.   On: 07/29/2018 16:04   Ct Head Wo Contrast  Result Date: 07/29/2018 CLINICAL DATA:  Fall with minor head trauma, high clinical risk of cervical spine injury; history hypertension, type II diabetes mellitus, stroke, prior subdural hematoma EXAM: CT HEAD WITHOUT CONTRAST CT CERVICAL SPINE WITHOUT CONTRAST TECHNIQUE: Multidetector CT imaging of the head and cervical spine was performed following the standard protocol without intravenous contrast. Multiplanar CT image reconstructions of the cervical spine were also generated. COMPARISON:  07/16/2018 CT head, CT cervical spine 04/06/2017 FINDINGS: CT HEAD FINDINGS Brain: Minimal atrophy. No midline  shift or mass effect. Old LEFT temporal lobe infarct with dilatation of the temporal horn of the LEFT lateral ventricle. Old LEFT basal ganglia lacunar infarct. No intracranial hemorrhage, mass lesion, or evidence of acute infarction. No extra-axial fluid collections. Vascular: Mild atherosclerotic calcifications of internal carotid arteries bilaterally at skull base. No hyperdense vessels. Skull: Intact Sinuses/Orbits: Clear Other: N/A CT CERVICAL SPINE FINDINGS Alignment: Mild retrolisthesis at C4-C5 unchanged. Congenital fusion C5-C6. Skull base and vertebrae: Vertebral body heights maintained. Disc space narrowing and endplate spur formation at C4-C5. No fracture, additional subluxation or bone destruction. Bones appear demineralized. Visualized skull base intact. Scattered mild facet degenerative changes. Soft tissues and spinal canal: Prevertebral soft tissues normal thickness. Remaining cervical soft tissues unremarkable. Disc levels:  No additional abnormalities Upper chest: Lung apices clear Other: Atherosclerotic calcifications in the carotid systems. IMPRESSION: Old LEFT temporal lobe and LEFT basal ganglia infarcts. No acute intracranial abnormalities. Degenerative disc and facet disease changes of the cervical spine. No acute cervical spine abnormalities. Electronically Signed   By: Ulyses Southward M.D.   On: 07/29/2018 15:51   Ct Cervical Spine Wo Contrast  Result Date: 07/29/2018 CLINICAL DATA:  Fall with minor head trauma, high clinical risk of cervical spine injury; history hypertension, type II diabetes mellitus, stroke, prior subdural hematoma EXAM: CT HEAD WITHOUT CONTRAST CT CERVICAL SPINE WITHOUT CONTRAST TECHNIQUE: Multidetector CT imaging of the head and cervical spine was performed following the standard protocol without intravenous contrast. Multiplanar CT image reconstructions of the cervical spine were also generated. COMPARISON:  07/16/2018 CT head, CT cervical spine 04/06/2017 FINDINGS:  CT HEAD FINDINGS Brain: Minimal atrophy. No midline shift or mass effect. Old LEFT temporal lobe infarct with dilatation of the temporal horn of the LEFT lateral ventricle. Old LEFT basal ganglia lacunar infarct. No intracranial hemorrhage, mass lesion, or evidence of acute infarction. No extra-axial fluid collections. Vascular: Mild atherosclerotic calcifications of internal carotid arteries bilaterally at skull base. No hyperdense vessels. Skull: Intact Sinuses/Orbits: Clear Other: N/A CT CERVICAL SPINE FINDINGS Alignment: Mild retrolisthesis at C4-C5 unchanged. Congenital fusion C5-C6. Skull base and vertebrae: Vertebral body heights maintained. Disc space narrowing and endplate spur formation at C4-C5. No fracture, additional subluxation or bone destruction. Bones appear demineralized. Visualized skull base intact. Scattered mild facet degenerative changes. Soft tissues and spinal canal: Prevertebral soft tissues normal thickness. Remaining cervical soft tissues unremarkable. Disc levels:  No additional abnormalities Upper chest: Lung apices clear Other: Atherosclerotic calcifications in the carotid systems. IMPRESSION: Old LEFT temporal lobe and LEFT basal ganglia infarcts. No acute intracranial abnormalities. Degenerative disc and facet disease changes of the cervical spine. No acute cervical spine abnormalities. Electronically Signed   By: Ulyses Southward M.D.   On:  07/29/2018 15:51   Mr Brain Wo Contrast  Result Date: 07/29/2018 CLINICAL DATA:  77 y/o  F; fall with head injury.  Recent stroke. EXAM: MRI HEAD WITHOUT CONTRAST TECHNIQUE: Multiplanar, multiecho pulse sequences of the brain and surrounding structures were obtained without intravenous contrast. COMPARISON:  07/29/2018 CT head. 07/18/2018 MRI head. 07/15/2018 MRI head. FINDINGS: Brain: Stable distribution of left subacute ACA distribution infarction with faint residual reduced diffusion. Stable chronic left anterior basal ganglia and left anterior  temporal lobe hemosiderin stained encephalomalacia. Ex vacuo dilatation of the left lateral ventricle. No new focus of reduced diffusion to indicate acute partially. No new susceptibility hypointensity to suggest interval hemorrhage. No extra-axial collection, herniation, or effacement of basilar cisterns. Vascular: Normal flow voids. Skull and upper cervical spine: Normal marrow signal. Sinuses/Orbits: Mild mucosal thickening paranasal sinuses. No abnormal signal of mastoid air cells. Bilateral intra-ocular lens replacement. Membrane within the posterior left lobe, likely posterior staphyloma, stable. Other: None. IMPRESSION: 1. No acute intracranial process identified. 2. Stable distribution of left ACA subacute infarct. 3. Stable chronic hemosiderin encephalomalacia of left anterior basal ganglia and left anterior temporal lobe. 4. Stable left lobe posterior staphyloma. Electronically Signed   By: Mitzi Hansen M.D.   On: 07/29/2018 20:37    ____________________________________________   PROCEDURES  Procedure(s) performed:   Procedures  None ____________________________________________   INITIAL IMPRESSION / ASSESSMENT AND PLAN / ED COURSE  Pertinent labs & imaging results that were available during my care of the patient were reviewed by me and considered in my medical decision making (see chart for details).  She presents to the emergency department for evaluation after fall.  She apparently has had some confusion over the past 2 days and fall today.  She is afebrile here and well-appearing.  She does have some baseline right-sided weakness from her recent stroke.  She is been compliant with her medications.  CT imaging of the head and neck are unremarkable for acute findings.  Clean films and labs reviewed.  UA pending.  Plan for MRI given patient's recent stroke with similar presentation.  MRI negative for acute findings. Plan for return home with plan to start PT in the  coming week.   At this time, I do not feel there is any life-threatening condition present. I have reviewed and discussed all results (EKG, imaging, lab, urine as appropriate), exam findings with patient. I have reviewed nursing notes and appropriate previous records.  I feel the patient is safe to be discharged home without further emergent workup. Discussed usual and customary return precautions. Patient and family (if present) verbalize understanding and are comfortable with this plan.  Patient will follow-up with their primary care provider. If they do not have a primary care provider, information for follow-up has been provided to them. All questions have been answered.  ____________________________________________  FINAL CLINICAL IMPRESSION(S) / ED DIAGNOSES  Final diagnoses:  Fall, initial encounter  Injury of head, initial encounter     MEDICATIONS GIVEN DURING THIS VISIT:  Medications  lactated ringers infusion ( Intravenous Stopped 07/30/18 0011)  LORazepam (ATIVAN) injection 0.5 mg (0.5 mg Intravenous Given 07/29/18 1939)    Note:  This document was prepared using Dragon voice recognition software and may include unintentional dictation errors.  Alona Bene, MD Emergency Medicine    Gwendloyn Forsee, Arlyss Repress, MD 07/30/18 [phone]

## 2018-07-29 NOTE — ED Triage Notes (Addendum)
Pt BIB [facility] for eval of fall w/ head injury (no blood thinners). EMS reports pt had a hx of stroke last week d/c'd on tuesday, and facility reported to EMS that she has been confused and unsteady since that time. EMS believes fall was syncopal episode.Caregiver reports that pt was waiting for elevator and tipped over sideways while holding her walker. Caregiver denies LOC. Caregiver reports pt has been "off" today in particular, she stared at the TV for a while with nothing on the TV, forgot her hairdresser appt and was unable to write a check. Pt is normal A&Ox4.  Pt is deaf as well as autistic and has caregiver at bedside translating. No obvious signs of injury to head/neck. C-Collar in place by EMS

## 2018-08-02 ENCOUNTER — Ambulatory Visit (INDEPENDENT_AMBULATORY_CARE_PROVIDER_SITE_OTHER): Payer: Medicare Other | Admitting: Endocrinology

## 2018-08-02 ENCOUNTER — Encounter: Payer: Self-pay | Admitting: Endocrinology

## 2018-08-02 ENCOUNTER — Other Ambulatory Visit: Payer: Self-pay | Admitting: Endocrinology

## 2018-08-02 VITALS — BP 136/84 | HR 79 | Ht 64.0 in | Wt 129.8 lb

## 2018-08-02 DIAGNOSIS — F840 Autistic disorder: Secondary | ICD-10-CM | POA: Diagnosis not present

## 2018-08-02 DIAGNOSIS — F79 Unspecified intellectual disabilities: Secondary | ICD-10-CM | POA: Diagnosis not present

## 2018-08-02 DIAGNOSIS — I63522 Cerebral infarction due to unspecified occlusion or stenosis of left anterior cerebral artery: Secondary | ICD-10-CM | POA: Diagnosis not present

## 2018-08-02 DIAGNOSIS — I69351 Hemiplegia and hemiparesis following cerebral infarction affecting right dominant side: Secondary | ICD-10-CM | POA: Diagnosis not present

## 2018-08-02 DIAGNOSIS — H9193 Unspecified hearing loss, bilateral: Secondary | ICD-10-CM | POA: Diagnosis not present

## 2018-08-02 DIAGNOSIS — E785 Hyperlipidemia, unspecified: Secondary | ICD-10-CM | POA: Diagnosis not present

## 2018-08-02 DIAGNOSIS — E039 Hypothyroidism, unspecified: Secondary | ICD-10-CM | POA: Diagnosis not present

## 2018-08-02 DIAGNOSIS — Z7982 Long term (current) use of aspirin: Secondary | ICD-10-CM | POA: Diagnosis not present

## 2018-08-02 DIAGNOSIS — Z7902 Long term (current) use of antithrombotics/antiplatelets: Secondary | ICD-10-CM | POA: Diagnosis not present

## 2018-08-02 DIAGNOSIS — M1990 Unspecified osteoarthritis, unspecified site: Secondary | ICD-10-CM | POA: Diagnosis not present

## 2018-08-02 DIAGNOSIS — I10 Essential (primary) hypertension: Secondary | ICD-10-CM | POA: Diagnosis not present

## 2018-08-02 DIAGNOSIS — G40909 Epilepsy, unspecified, not intractable, without status epilepticus: Secondary | ICD-10-CM | POA: Diagnosis not present

## 2018-08-02 DIAGNOSIS — F419 Anxiety disorder, unspecified: Secondary | ICD-10-CM | POA: Diagnosis not present

## 2018-08-02 LAB — LIPID PANEL
CHOL/HDL RATIO: 2
Cholesterol: 135 mg/dL (ref 0–200)
HDL: 58.2 mg/dL (ref >39.00)
LDL Cholesterol: 50 mg/dL (ref 0–99)
NONHDL: 76.39
TRIGLYCERIDES: 133 mg/dL (ref 0.0–149.0)
VLDL: 26.6 mg/dL (ref 0.0–40.0)

## 2018-08-02 LAB — TSH: TSH: 12.31 u[IU]/mL — ABNORMAL HIGH (ref 0.35–4.50)

## 2018-08-02 MED ORDER — LEVOTHYROXINE SODIUM 75 MCG PO TABS: 75.0000 ug | ORAL_TABLET | Freq: Every day | ORAL | 3 refills | Status: DC

## 2018-08-02 NOTE — Progress Notes (Signed)
Subjective:    Patient ID: Laura [name], female    DOB: 22-Dec-1940, 77 y.o.   MRN: [id number]  HPI Pt was recently in the hospital with CVA.  She has stopped clonazepam.  Since then, she has continued to have forgetfulness.  She still lives at [facility].   Pt still has anxiety and tremor.   Past Medical History:  Diagnosis Date  . Anxiety state, unspecified 10/10/2007  . Arthritis   . ASYMPTOMATIC POSTMENOPAUSAL STATUS 04/15/2009  . Cataract    removed both eyes   . Deaf   . Dermatitis   . Headache(784.0) 08/27/2008  . HYPERLIPIDEMIA 06/22/2007   rx refused  . HYPERTENSION 05/07/2008  . HYPOTHYROIDISM 04/15/2009  . Mild mental retardation   . 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 RESTLESS LEG SYNDROME 10/01/2008  . Stroke (HCC)   . TACHYCARDIA 08/27/2008  . TREMOR 11/21/2007  . TRIGGER FINGER 10/30/2009    Past Surgical History:  Procedure Laterality Date  . CATARACT EXTRACTION, BILATERAL    . COLONOSCOPY      Social History   Socioeconomic History  . Marital status: Single    Spouse name: Not on file  . Number of children: Not on file  . Years of education: Not on file  . Highest education level: Not on file  Occupational History  . Occupation: Disabled    Associate Professor: UNEMPLOYED  Social Needs  . Financial resource strain: Not on file  . Food insecurity:    Worry: Not on file    Inability: Not on file  . Transportation needs:    Medical: Not on file    Non-medical: Not on file  Tobacco Use  . Smoking status: Never Smoker  . Smokeless tobacco: Never Used  Substance and Sexual Activity  . Alcohol use: No  . Drug use: No  . Sexual activity: Not on file  Lifestyle  . Physical activity:    Days per week: Not on file    Minutes per session: Not on file  . Stress: Not on file  Relationships  . Social connections:    Talks on phone: Not on file    Gets together: Not on file    Attends religious service: Not on file    Active member of club or  organization: Not on file    Attends meetings of clubs or organizations: Not on file    Relationship status: Not on file  . Intimate partner violence:    Fear of current or ex partner: Not on file    Emotionally abused: Not on file    Physically abused: Not on file    Forced sexual activity: Not on file  Other Topics Concern  . Not on file  Social History Narrative   Lives alone   Never married   Physical activity is very good   She has never had a driver's license   Does not have a phone at home     Current Outpatient Medications on File Prior to Visit  Medication Sig Dispense Refill  . acetaminophen (TYLENOL) 325 MG tablet Take 1-2 tablets (325-650 mg total) by mouth every 4 (four) hours as needed for mild pain.    Marland Kitchen alendronate (FOSAMAX) 70 MG tablet TAKE 1 TABLET BY MOUTH ONCE A WEEK .  TAKE  WITH  A  FULL  GLASS  OF  WATER  ON  AN  EMPTY  STOMACH (Patient taking differently: Take 70 mg by mouth once a week. On  Saturday) 4 tablet 11  . aspirin 325 MG EC tablet Take 1 tablet (325 mg total) by mouth daily. STOP TAKING ASPIRIN AFTER 10/18/18 90 tablet 0  . atorvastatin (LIPITOR) 80 MG tablet Take 1 tablet (80 mg total) by mouth daily at 6 PM. 30 tablet 0  . cholecalciferol (VITAMIN D) 1000 UNITS tablet Take 1,000 Units by mouth daily.    . clopidogrel (PLAVIX) 75 MG tablet Take 1 tablet (75 mg total) by mouth daily. 30 tablet 0  . levETIRAcetam (KEPPRA) 500 MG tablet Take 1 tablet (500 mg total) by mouth 2 (two) times daily.    Marland Kitchen loratadine (CLARITIN) 10 MG tablet Take 1 tablet (10 mg total) by mouth daily as needed for allergies.    . Multiple Vitamin (MULTIVITAMIN) tablet Take 1 tablet by mouth daily.      Marland Kitchen triamcinolone cream (KENALOG) 0.1 % APPLY  CREAM EXTERNALLY THREE TIMES DAILY AS NEEDED FOR ITCHING 80 g 2   No current facility-administered medications on file prior to visit.     No Known Allergies  Family History  Problem Relation Age of Onset  . Thyroid disease Neg  Hx   . Colon cancer Neg Hx   . Colon polyps Neg Hx   . Rectal cancer Neg Hx   . Stomach cancer Neg Hx     BP 136/84   Pulse 79   Ht 5\' 4"  (1.626 m)   Wt 129 lb 12.8 oz (58.9 kg)   SpO2 95%   BMI 22.28 kg/m    Review of Systems No weight change.  No more falls since last ER visit.      Objective:   Physical Exam VITAL SIGNS:  See vs page.   GENERAL: no distress.  NEURO: fine tremor of the hands.   PSYCH: oriented to self and date.   Gait: slow but steady.        Assessment & Plan:  CVA: new.   Memory loss: prob due to CVA Anxiety: we discussed.  Pt declines to resume clonazepam--OK with me  Patient Instructions  Please see Dr Karel Jarvis as scheduled.   blood tests are requested for you today.  We'll let you know about the results. Please let us know if you decide to resume the clonazepam at a lower amount.   It is critically important to prevent falling down (keep floor areas well-lit, dry, and free of loose objects.  If you have a cane, walker, or wheelchair, you should use it, even for short trips around the house.  Wear flat-soled shoes.  Also, try not to rush).   You should not live alone, at least for now.   Best wished with your new primary care provider.

## 2018-08-02 NOTE — Patient Instructions (Addendum)
Please see Dr Karel Jarvis as scheduled.   blood tests are requested for you today.  We'll let you know about the results. Please let us know if you decide to resume the clonazepam at a lower amount.   It is critically important to prevent falling down (keep floor areas well-lit, dry, and free of loose objects.  If you have a cane, walker, or wheelchair, you should use it, even for short trips around the house.  Wear flat-soled shoes.  Also, try not to rush).   You should not live alone, at least for now.   Best wished with your new primary care provider.

## 2018-08-03 ENCOUNTER — Telehealth: Payer: Self-pay | Admitting: Endocrinology

## 2018-08-03 ENCOUNTER — Encounter: Payer: Self-pay | Admitting: Endocrinology

## 2018-08-03 NOTE — Telephone Encounter (Signed)
OK 

## 2018-08-03 NOTE — Telephone Encounter (Signed)
Please advise, place referral?

## 2018-08-03 NOTE — Telephone Encounter (Signed)
Rosanne Ashing with [facility] is calling for Physical therapy orders,  (2 times a week for 3 weeks) He also needs a Referral for occupational therapy ADLs And A Referral speech therapy,    He stated that had fall on the 6th,she followed up at ER, and was negative for fracture. Patient does have pain right on her rib cage Also has a Cold and mildly diminished lung sounds.   He stated that there are some medication Missing from the home levETIRAcetam (KEPPRA) 500 MG tablet   triamcinolone cream (KENALOG) 0.1 %     Rosanne Ashing [phone]

## 2018-08-04 ENCOUNTER — Telehealth: Payer: Self-pay | Admitting: *Deleted

## 2018-08-04 NOTE — Telephone Encounter (Signed)
Threasa AlphaJim Hoffman, PT, [facility] left a voicemail  [facility].  He is asking for verbal orders for HHPT 2week3, OT Eval, ST Eval and interpreter services. Medical record reviewed. Social work note reviewed. Verbal orders given per office protocol.

## 2018-08-10 ENCOUNTER — Telehealth: Payer: Self-pay

## 2018-08-10 DIAGNOSIS — I10 Essential (primary) hypertension: Secondary | ICD-10-CM | POA: Diagnosis not present

## 2018-08-10 DIAGNOSIS — F840 Autistic disorder: Secondary | ICD-10-CM | POA: Diagnosis not present

## 2018-08-10 DIAGNOSIS — I69351 Hemiplegia and hemiparesis following cerebral infarction affecting right dominant side: Secondary | ICD-10-CM | POA: Diagnosis not present

## 2018-08-10 DIAGNOSIS — H9193 Unspecified hearing loss, bilateral: Secondary | ICD-10-CM | POA: Diagnosis not present

## 2018-08-10 DIAGNOSIS — G40909 Epilepsy, unspecified, not intractable, without status epilepticus: Secondary | ICD-10-CM | POA: Diagnosis not present

## 2018-08-10 DIAGNOSIS — M1990 Unspecified osteoarthritis, unspecified site: Secondary | ICD-10-CM | POA: Diagnosis not present

## 2018-08-10 NOTE — Telephone Encounter (Signed)
Rev[facility] called requesting verbal orders for speech for 1wk2. It does not indicate on discharge summary ST. Is this ok?

## 2018-08-11 DIAGNOSIS — E039 Hypothyroidism, unspecified: Secondary | ICD-10-CM | POA: Diagnosis not present

## 2018-08-11 DIAGNOSIS — Z7982 Long term (current) use of aspirin: Secondary | ICD-10-CM | POA: Diagnosis not present

## 2018-08-11 DIAGNOSIS — Z7902 Long term (current) use of antithrombotics/antiplatelets: Secondary | ICD-10-CM | POA: Diagnosis not present

## 2018-08-11 DIAGNOSIS — I10 Essential (primary) hypertension: Secondary | ICD-10-CM | POA: Diagnosis not present

## 2018-08-11 DIAGNOSIS — E785 Hyperlipidemia, unspecified: Secondary | ICD-10-CM

## 2018-08-11 DIAGNOSIS — F840 Autistic disorder: Secondary | ICD-10-CM | POA: Diagnosis not present

## 2018-08-11 DIAGNOSIS — F79 Unspecified intellectual disabilities: Secondary | ICD-10-CM

## 2018-08-11 DIAGNOSIS — F419 Anxiety disorder, unspecified: Secondary | ICD-10-CM | POA: Diagnosis not present

## 2018-08-11 DIAGNOSIS — H9193 Unspecified hearing loss, bilateral: Secondary | ICD-10-CM | POA: Diagnosis not present

## 2018-08-11 DIAGNOSIS — M1990 Unspecified osteoarthritis, unspecified site: Secondary | ICD-10-CM

## 2018-08-11 DIAGNOSIS — I69351 Hemiplegia and hemiparesis following cerebral infarction affecting right dominant side: Secondary | ICD-10-CM | POA: Diagnosis not present

## 2018-08-11 DIAGNOSIS — G40909 Epilepsy, unspecified, not intractable, without status epilepticus: Secondary | ICD-10-CM | POA: Diagnosis not present

## 2018-08-11 NOTE — Telephone Encounter (Signed)
Called Laura [name] and approved verbal orders for 1x2wks

## 2018-08-11 NOTE — Telephone Encounter (Signed)
Ok for [employer] tx

## 2018-08-12 ENCOUNTER — Telehealth: Payer: Self-pay | Admitting: *Deleted

## 2018-08-12 DIAGNOSIS — H9193 Unspecified hearing loss, bilateral: Secondary | ICD-10-CM | POA: Diagnosis not present

## 2018-08-12 DIAGNOSIS — G40909 Epilepsy, unspecified, not intractable, without status epilepticus: Secondary | ICD-10-CM | POA: Diagnosis not present

## 2018-08-12 DIAGNOSIS — M1990 Unspecified osteoarthritis, unspecified site: Secondary | ICD-10-CM | POA: Diagnosis not present

## 2018-08-12 DIAGNOSIS — F840 Autistic disorder: Secondary | ICD-10-CM | POA: Diagnosis not present

## 2018-08-12 DIAGNOSIS — I10 Essential (primary) hypertension: Secondary | ICD-10-CM | POA: Diagnosis not present

## 2018-08-12 DIAGNOSIS — I69351 Hemiplegia and hemiparesis following cerebral infarction affecting right dominant side: Secondary | ICD-10-CM | POA: Diagnosis not present

## 2018-08-12 NOTE — Telephone Encounter (Signed)
[facility]  Approval given.

## 2018-08-15 DIAGNOSIS — I69351 Hemiplegia and hemiparesis following cerebral infarction affecting right dominant side: Secondary | ICD-10-CM | POA: Diagnosis not present

## 2018-08-15 DIAGNOSIS — G40909 Epilepsy, unspecified, not intractable, without status epilepticus: Secondary | ICD-10-CM | POA: Diagnosis not present

## 2018-08-15 DIAGNOSIS — H9193 Unspecified hearing loss, bilateral: Secondary | ICD-10-CM | POA: Diagnosis not present

## 2018-08-15 DIAGNOSIS — I10 Essential (primary) hypertension: Secondary | ICD-10-CM | POA: Diagnosis not present

## 2018-08-15 DIAGNOSIS — M1990 Unspecified osteoarthritis, unspecified site: Secondary | ICD-10-CM | POA: Diagnosis not present

## 2018-08-15 DIAGNOSIS — F840 Autistic disorder: Secondary | ICD-10-CM | POA: Diagnosis not present

## 2018-08-19 DIAGNOSIS — I10 Essential (primary) hypertension: Secondary | ICD-10-CM | POA: Diagnosis not present

## 2018-08-19 DIAGNOSIS — F840 Autistic disorder: Secondary | ICD-10-CM | POA: Diagnosis not present

## 2018-08-19 DIAGNOSIS — M1990 Unspecified osteoarthritis, unspecified site: Secondary | ICD-10-CM | POA: Diagnosis not present

## 2018-08-19 DIAGNOSIS — H9193 Unspecified hearing loss, bilateral: Secondary | ICD-10-CM | POA: Diagnosis not present

## 2018-08-19 DIAGNOSIS — G40909 Epilepsy, unspecified, not intractable, without status epilepticus: Secondary | ICD-10-CM | POA: Diagnosis not present

## 2018-08-19 DIAGNOSIS — I69351 Hemiplegia and hemiparesis following cerebral infarction affecting right dominant side: Secondary | ICD-10-CM | POA: Diagnosis not present

## 2018-08-22 ENCOUNTER — Telehealth: Payer: Self-pay | Admitting: Endocrinology

## 2018-08-22 ENCOUNTER — Other Ambulatory Visit: Payer: Self-pay

## 2018-08-22 DIAGNOSIS — I10 Essential (primary) hypertension: Secondary | ICD-10-CM | POA: Diagnosis not present

## 2018-08-22 DIAGNOSIS — H9193 Unspecified hearing loss, bilateral: Secondary | ICD-10-CM | POA: Diagnosis not present

## 2018-08-22 DIAGNOSIS — I69351 Hemiplegia and hemiparesis following cerebral infarction affecting right dominant side: Secondary | ICD-10-CM | POA: Diagnosis not present

## 2018-08-22 DIAGNOSIS — G40909 Epilepsy, unspecified, not intractable, without status epilepticus: Secondary | ICD-10-CM | POA: Diagnosis not present

## 2018-08-22 DIAGNOSIS — M1990 Unspecified osteoarthritis, unspecified site: Secondary | ICD-10-CM | POA: Diagnosis not present

## 2018-08-22 DIAGNOSIS — F840 Autistic disorder: Secondary | ICD-10-CM | POA: Diagnosis not present

## 2018-08-22 MED ORDER — ALENDRONATE SODIUM 70 MG PO TABS: ORAL_TABLET | ORAL | 2 refills | Status: AC

## 2018-08-22 MED ORDER — ALENDRONATE SODIUM 70 MG PO TABS: ORAL_TABLET | ORAL | 2 refills | Status: DC

## 2018-08-22 MED ORDER — LEVOTHYROXINE SODIUM 75 MCG PO TABS: 75.0000 ug | ORAL_TABLET | Freq: Every day | ORAL | 0 refills | Status: DC

## 2018-08-22 NOTE — Telephone Encounter (Signed)
Please advise or just FYI?

## 2018-08-22 NOTE — Telephone Encounter (Signed)
Please advise 

## 2018-08-22 NOTE — Telephone Encounter (Signed)
BP was better at last ov here, so I favor following for now.

## 2018-08-22 NOTE — Telephone Encounter (Signed)
Laura [name] with [facility] PT # [phone] Pt's BP has been elevated Friday it was 140/90 at 3 pm and today is was 145/100 3 pm  Speech therapist saw pt this AM and it was within normal limits this AM.   [name] did take the BP med this AM

## 2018-08-22 NOTE — Telephone Encounter (Signed)
Spoke to pt caregiver and sent refills to pharmacy

## 2018-08-22 NOTE — Telephone Encounter (Signed)
Pt's daughter states that [facility] has not found a primary care provider and wants to see if all the RX filled by [name] can be refilled. Please advise daughter. If no answer please leave detailed message.  Ph # [phone]

## 2018-08-22 NOTE — Telephone Encounter (Signed)
Please fill each of these x 3 mos.  Please request new PCP at [facility] have availability.

## 2018-08-24 ENCOUNTER — Telehealth: Payer: Self-pay

## 2018-08-24 DIAGNOSIS — H9193 Unspecified hearing loss, bilateral: Secondary | ICD-10-CM | POA: Diagnosis not present

## 2018-08-24 DIAGNOSIS — M1990 Unspecified osteoarthritis, unspecified site: Secondary | ICD-10-CM | POA: Diagnosis not present

## 2018-08-24 DIAGNOSIS — G40909 Epilepsy, unspecified, not intractable, without status epilepticus: Secondary | ICD-10-CM | POA: Diagnosis not present

## 2018-08-24 DIAGNOSIS — F840 Autistic disorder: Secondary | ICD-10-CM | POA: Diagnosis not present

## 2018-08-24 DIAGNOSIS — I69351 Hemiplegia and hemiparesis following cerebral infarction affecting right dominant side: Secondary | ICD-10-CM | POA: Diagnosis not present

## 2018-08-24 DIAGNOSIS — I10 Essential (primary) hypertension: Secondary | ICD-10-CM | POA: Diagnosis not present

## 2018-08-24 DIAGNOSIS — Z23 Encounter for immunization: Secondary | ICD-10-CM | POA: Diagnosis not present

## 2018-08-24 NOTE — Telephone Encounter (Signed)
Threasa Alpha, PT/[facility] called for additional orders 1wk2 for balance, overall doing well. Approved orders.

## 2018-08-24 NOTE — Telephone Encounter (Signed)
Pt informed

## 2018-08-26 DIAGNOSIS — F840 Autistic disorder: Secondary | ICD-10-CM | POA: Diagnosis not present

## 2018-08-26 DIAGNOSIS — M1990 Unspecified osteoarthritis, unspecified site: Secondary | ICD-10-CM | POA: Diagnosis not present

## 2018-08-26 DIAGNOSIS — G40909 Epilepsy, unspecified, not intractable, without status epilepticus: Secondary | ICD-10-CM | POA: Diagnosis not present

## 2018-08-26 DIAGNOSIS — I69351 Hemiplegia and hemiparesis following cerebral infarction affecting right dominant side: Secondary | ICD-10-CM | POA: Diagnosis not present

## 2018-08-26 DIAGNOSIS — I10 Essential (primary) hypertension: Secondary | ICD-10-CM | POA: Diagnosis not present

## 2018-08-26 DIAGNOSIS — H9193 Unspecified hearing loss, bilateral: Secondary | ICD-10-CM | POA: Diagnosis not present

## 2018-08-30 ENCOUNTER — Encounter: Payer: Self-pay | Admitting: Neurology

## 2018-08-30 ENCOUNTER — Ambulatory Visit (INDEPENDENT_AMBULATORY_CARE_PROVIDER_SITE_OTHER): Payer: Medicare Other | Admitting: Neurology

## 2018-08-30 ENCOUNTER — Other Ambulatory Visit: Payer: Self-pay

## 2018-08-30 VITALS — BP 158/92 | HR 93 | Ht 63.5 in | Wt 130.0 lb

## 2018-08-30 DIAGNOSIS — Z8679 Personal history of other diseases of the circulatory system: Secondary | ICD-10-CM

## 2018-08-30 DIAGNOSIS — I63522 Cerebral infarction due to unspecified occlusion or stenosis of left anterior cerebral artery: Secondary | ICD-10-CM

## 2018-08-30 DIAGNOSIS — I639 Cerebral infarction, unspecified: Secondary | ICD-10-CM

## 2018-08-30 MED ORDER — CLOPIDOGREL BISULFATE 75 MG PO TABS: 75.0000 mg | ORAL_TABLET | Freq: Every day | ORAL | 3 refills | Status: AC

## 2018-08-30 MED ORDER — ATORVASTATIN CALCIUM 80 MG PO TABS: 80.0000 mg | ORAL_TABLET | Freq: Every day | ORAL | 3 refills | Status: AC

## 2018-08-30 MED ORDER — LEVETIRACETAM 500 MG PO TABS: 500.0000 mg | ORAL_TABLET | Freq: Two times a day (BID) | ORAL | 3 refills | Status: AC

## 2018-08-30 NOTE — Patient Instructions (Addendum)
1. Continue aspirin and Plavix until November 22, then stop aspirin and continue only Plavix   2. Recommend starting to check blood pressure on a daily basis, and show trend to your new PCP  3. Recommend continued supervision, and depending on level of need, start slowly reducing care as required  4. Continue all your other medications, follow-up in 6 months, call for any changes

## 2018-08-30 NOTE — Progress Notes (Signed)
NEUROLOGY FOLLOW UP OFFICE NOTE  LORNA STROTHER [id number] [birth year]  HISTORY OF PRESENT ILLNESS: I had the pleasure of seeing Erinn Mendosa in follow-up in the neurology clinic on 08/30/2018.  The patient was last seen more than a year ago for cognitive changes after a left subdural hematoma. She is again accompanied by her [name], her caregiver [name], who help supplement the history today. A sign language interpreter helps with translation. Records and images were personally reviewed where available. Since her last visit, she was admitted for altered mental status on 07/14/18, with confusion, inability to concentrate, misspelling during signing, gait difficulties, and dizziness. There was also report of staring. She had an MRI brain which showed a cluster of small acute infarcts in the distal left ACA versus MCA/ACA watershed territory, left temporal encephalomalacia. She had an echo which showed EF of 55-60%, mildly dilated left atrium. LDL 168, HbA1c 6.7. CTA head and neck showed occlusion of the left ACA in the A1 segment, minimal atherosclerotic disease at carotid bifurcations without stenosis, 30% left vertebral artery stenosis. Source of stroke unknown, concerning for cardiac arrhythmia, a 30-day event monitor was recommended. During her admission, after IV hydralazine, she had worsening right-sided weakness,repeat MRI showed mild extension of distal left ACA territory infarct. Treatment plan recommended was DAPT for 3 months, then Plavix alone. She was back in the ER on 07/29/18 after a fall, staff noted she was more confused for 2 days prior, very forgetful, sometimes staring as if not there. She was standing with her walker then fell to the right side, no loss of consciousness. MRI brain  no acute changes. She reports doing better, no residual weakness from recent stroke. She had 24/7 supervision after hospital discharge, staff has reduced it to 12 hour supervision. [name] reports that she  continues to have cognitive difficulties which has improved some since recent hospital stay. Initially she would stare off and appeared unable to complete tasks, needing instructions on how to brush her teeth (ie turn on faucet, etc). This is getting better. [name] reports difficulties understanding her medications, they plan to use a PillPack since she would mess around with her pillbox. She threw out her shower chair one time. [name] does not think she can keep a 30-day holter on, she pulls off everything once she is ready for bed. She denies any headaches, dizziness, vision changes, focal numbness/tingling/weakness. She initially had 24/7 supervision, now down to 12-hour supervision. Staff has noted that she would stuff her mouth with food if unsupervised, leading to choking. She reports she is getting better with this.  History on Initial Assessment 06/03/2017: This is a pleasant 77 year old woman with a history of hypertension, hyperlipidemia, autism spectrum disorder, deaf, presenting after hospitalization from May 15-22, 2018. Records were reviewed. She lives independently at [facility] and was last known normal the evening before admission. She had not been seen the next morning, and staff found her around 11am on a chair in her room, confused and covered in emesis. She was brought to [facility] where head CT showed a left temporal lobe hematoma with surrounding intraparenchymal hemorrhages, acute left holohemispheric subdural hematoma with acute falcotentorial subdural hematoma with left to right shift. There was a small right parietal scalp hematoma. No skull fracture. Bleed was felt to be traumatic in etiology. She was monitored clinically, no surgical intervention done. She started to become less confused and was discharged to rehab. She had some encephalopathy when first returning to [facility], she  has been under 24/7 care and initially needed help with bathing, but has shown improvement, she can now  dress and bathe independently. She reports she is doing fine, "I'm happy." She denies any headaches, dizziness, diplopia, dysphagia, neck/back pain, focal numbness/tingling/weakness, bowel/bladder dysfunction. She has a history of an internal sensation of shaking in her legs, she states she takes medicine that helps. She had previously been ambulating independently but had balance issues, but since the bleed, balance has been worse and she has been using the walker. She would sometimes forget to use her walker and leave it in the room. Her HCPOA reports that staff has been trying to stimulate her and get her back to a routine. She functions on the autism spectrum where she is very rigid and rule-driven, she has to take her medications and meals at specific times. They are trying to get her back to a set routine so she can stay independent, they have not trained her to put on Depends at night . Her HCPOA reports she still has a lot of deficits, she has memory loss where things she used to remember off the top of her head are a struggle, such as names or an event. She would say "[name] did this with me," but she actually did not. She used to finger spell quickly, now this is a struggle. Her reasoning skills have always been limited, but it is worse. Vision problems seem to have worsened as well. She is talking to herself a lot now, she has a video phone she uses to communicate with other hearing impaired persons, now she would pick up the phone and watch the video of herself, signing to herself as if communicating with another person, telling a whole story, then turning to [name] and saying she is done. They watched some of them on video, she would get lost in part of the story or forget names and stumble. Sometimes she does this in front of a mirror. When she first came home, she would stare off then come back, they are seeing less of this now, probably a couple of times a week. No convulsive activity or body jerks  noted. She was started on Keppra in the hospital for seizure prophylaxis, she has not seen Neurosurgery since discharge and continues on medication with no significant side effects. She states she takes her medications herself twice a day, she is pretty good with relating how and when she takes her medications.   PAST MEDICAL HISTORY: Past Medical History:  Diagnosis Date  . Anxiety state, unspecified 10/10/2007  . Arthritis   . ASYMPTOMATIC POSTMENOPAUSAL STATUS 04/15/2009  . Cataract    removed both eyes   . Deaf   . Dermatitis   . Headache(784.0) 08/27/2008  . HYPERLIPIDEMIA 06/22/2007   rx refused  . HYPERTENSION 05/07/2008  . HYPOTHYROIDISM 04/15/2009  . Mild mental retardation   . 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 RESTLESS LEG SYNDROME 10/01/2008  . Stroke (HCC)   . TACHYCARDIA 08/27/2008  . TREMOR 11/21/2007  . TRIGGER FINGER 10/30/2009    MEDICATIONS: Current Outpatient Medications on File Prior to Visit  Medication Sig Dispense Refill  . acetaminophen (TYLENOL) 325 MG tablet Take 1-2 tablets (325-650 mg total) by mouth every 4 (four) hours as needed for mild pain.    Marland Kitchen alendronate (FOSAMAX) 70 MG tablet TAKE 1 TABLET BY MOUTH ONCE A WEEK .  TAKE  WITH  A  FULL  GLASS  OF  WATER  ON  AN  EMPTY  STOMACH 4 tablet 2  . aspirin 325 MG EC tablet Take 1 tablet (325 mg total) by mouth daily. STOP TAKING ASPIRIN AFTER 10/18/18 90 tablet 0  . atorvastatin (LIPITOR) 80 MG tablet Take 1 tablet (80 mg total) by mouth daily at 6 PM. 30 tablet 0  . cholecalciferol (VITAMIN D) 1000 UNITS tablet Take 1,000 Units by mouth daily.    . clopidogrel (PLAVIX) 75 MG tablet Take 1 tablet (75 mg total) by mouth daily. 30 tablet 0  . levETIRAcetam (KEPPRA) 500 MG tablet Take 1 tablet (500 mg total) by mouth 2 (two) times daily.    Marland Kitchen levothyroxine (SYNTHROID, LEVOTHROID) 75 MCG tablet Take 1 tablet (75 mcg total) by mouth daily. 90 tablet 0  . loratadine (CLARITIN) 10 MG tablet Take 1 tablet (10 mg  total) by mouth daily as needed for allergies.    . Multiple Vitamin (MULTIVITAMIN) tablet Take 1 tablet by mouth daily.      Marland Kitchen triamcinolone cream (KENALOG) 0.1 % APPLY  CREAM EXTERNALLY THREE TIMES DAILY AS NEEDED FOR ITCHING 80 g 2   No current facility-administered medications on file prior to visit.     ALLERGIES: No Known Allergies  FAMILY HISTORY: Family History  Problem Relation Age of Onset  . Thyroid disease Neg Hx   . Colon cancer Neg Hx   . Colon polyps Neg Hx   . Rectal cancer Neg Hx   . Stomach cancer Neg Hx     SOCIAL HISTORY: Social History   Socioeconomic History  . Marital status: Single    Spouse name: Not on file  . Number of children: Not on file  . Years of education: Not on file  . Highest education level: Not on file  Occupational History  . Occupation: Disabled    Associate Professor: UNEMPLOYED  Social Needs  . Financial resource strain: Not on file  . Food insecurity:    Worry: Not on file    Inability: Not on file  . Transportation needs:    Medical: Not on file    Non-medical: Not on file  Tobacco Use  . Smoking status: Never Smoker  . Smokeless tobacco: Never Used  Substance and Sexual Activity  . Alcohol use: No  . Drug use: No  . Sexual activity: Not on file  Lifestyle  . Physical activity:    Days per week: Not on file    Minutes per session: Not on file  . Stress: Not on file  Relationships  . Social connections:    Talks on phone: Not on file    Gets together: Not on file    Attends religious service: Not on file    Active member of club or organization: Not on file    Attends meetings of clubs or organizations: Not on file    Relationship status: Not on file  . Intimate partner violence:    Fear of current or ex partner: Not on file    Emotionally abused: Not on file    Physically abused: Not on file    Forced sexual activity: Not on file  Other Topics Concern  . Not on file  Social History Narrative   Lives alone   Never  married   Physical activity is very good   She has never had a driver's license   Does not have a phone at home     REVIEW OF SYSTEMS: Constitutional: No fevers, chills, or sweats, no generalized fatigue, change in  appetite Eyes: No visual changes, double vision, eye pain Ear, nose and throat: No hearing loss, ear pain, nasal congestion, sore throat Cardiovascular: No chest pain, palpitations Respiratory:  No shortness of breath at rest or with exertion, wheezes GastrointestinaI: No nausea, vomiting, diarrhea, abdominal pain, fecal incontinence Genitourinary:  No dysuria, urinary retention or frequency Musculoskeletal:  No neck pain, back pain Integumentary: No rash, pruritus, skin lesions Neurological: as above Psychiatric: No depression, insomnia, anxiety Endocrine: No palpitations, fatigue, diaphoresis, mood swings, change in appetite, change in weight, increased thirst Hematologic/Lymphatic:  No anemia, purpura, petechiae. Allergic/Immunologic: no itchy/runny eyes, nasal congestion, recent allergic reactions, rashes  PHYSICAL EXAM: Vitals:   08/30/18 1329  BP: (!) 158/92  Pulse: 93  SpO2: 97%   General: No acute distress Head:  Normocephalic/atraumatic Neck: supple, no paraspinal tenderness, full range of motion Heart:  Regular rate and rhythm Lungs:  Clear to auscultation bilaterally Back: No paraspinal tenderness Skin/Extremities: No rash, no edema Neurological Exam: alert and oriented to person, time, difficulty saying city. Able to name all her caregivers who rotate taking care of her. Fund of knowledge is reduced.  Recent and remote memory are impaired.  Attention and concentration are normal, able to spell WORLD but had difficulty comprehending instructions to spell backward, could not do serial 7s. Able to name objects and repeat. CDT 4/5 MMSE - Mini Mental State Exam 08/30/2018  Orientation to time 5  Orientation to Place 3  Registration 3  Attention/ Calculation 0   Recall 0  Language- name 2 objects 2  Language- repeat 1  Language- follow 3 step command 2  Language- read & follow direction 1  Write a sentence 0  Copy design 1  Total score 18    Cranial nerves: Pupils equal, round, reactive to light. Extraocular movements intact with no nystagmus. Visual fields full. Facial sensation intact. No facial asymmetry. Tongue, uvula, palate midline.  Motor: Bulk and tone normal, muscle strength 5/5 throughout with no pronator drift.  Sensation to light touch intact.  No extinction to double simultaneous stimulation.  Deep tendon reflexes +2 throughout, toes downgoing.  Finger to nose testing intact.  Gait narrow-based with walker, no ataxia.  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 A 30-day cardiac monitor was recommended, however her POA does not think she will keep this on. Continue aspirin and Plavix for another month, then Plavix alone. [name] asked about metoprolol which was discontinued in the hospital, discussed that the stroke extension occurred due to hypotension, recommend monitoring BP at home and if it is consistently elevated, they can restart with her PCP. Continue lipid control. [name] asks about supervision, we discussed continued monitoring and depending on level of need, slowly weaning down to prior level of care. She was on Keppra after subdural hematoma, MRI shows left temporal encephalomalacia. With report of staring, would continue for now. They know to go to the ER for any sudden changes in symptoms.  Follow-up in 6 months, they know to call for any changes.  Thank you for allowing me to participate in her care.  Please do not hesitate to call for any questions or concerns.  The duration of this appointment  visit was 30 minutes of face-to-face time with the patient.  Greater than 50% of this time was spent  in counseling, explanation of diagnosis, planning of further management, and coordination of care.   Patrcia Dolly, M.D.   CC: Dr. [name]

## 2018-08-31 DIAGNOSIS — F840 Autistic disorder: Secondary | ICD-10-CM | POA: Diagnosis not present

## 2018-08-31 DIAGNOSIS — I69351 Hemiplegia and hemiparesis following cerebral infarction affecting right dominant side: Secondary | ICD-10-CM | POA: Diagnosis not present

## 2018-08-31 DIAGNOSIS — M1990 Unspecified osteoarthritis, unspecified site: Secondary | ICD-10-CM | POA: Diagnosis not present

## 2018-08-31 DIAGNOSIS — I10 Essential (primary) hypertension: Secondary | ICD-10-CM | POA: Diagnosis not present

## 2018-08-31 DIAGNOSIS — G40909 Epilepsy, unspecified, not intractable, without status epilepticus: Secondary | ICD-10-CM | POA: Diagnosis not present

## 2018-08-31 DIAGNOSIS — H9193 Unspecified hearing loss, bilateral: Secondary | ICD-10-CM | POA: Diagnosis not present

## 2018-09-01 ENCOUNTER — Other Ambulatory Visit: Payer: Self-pay | Admitting: Endocrinology

## 2018-09-08 ENCOUNTER — Telehealth: Payer: Self-pay | Admitting: Endocrinology

## 2018-09-08 DIAGNOSIS — G40909 Epilepsy, unspecified, not intractable, without status epilepticus: Secondary | ICD-10-CM | POA: Diagnosis not present

## 2018-09-08 DIAGNOSIS — I10 Essential (primary) hypertension: Secondary | ICD-10-CM | POA: Diagnosis not present

## 2018-09-08 DIAGNOSIS — I69351 Hemiplegia and hemiparesis following cerebral infarction affecting right dominant side: Secondary | ICD-10-CM | POA: Diagnosis not present

## 2018-09-08 DIAGNOSIS — M1990 Unspecified osteoarthritis, unspecified site: Secondary | ICD-10-CM | POA: Diagnosis not present

## 2018-09-08 DIAGNOSIS — F840 Autistic disorder: Secondary | ICD-10-CM | POA: Diagnosis not present

## 2018-09-08 DIAGNOSIS — H9193 Unspecified hearing loss, bilateral: Secondary | ICD-10-CM | POA: Diagnosis not present

## 2018-09-08 NOTE — Telephone Encounter (Signed)
Verbal OK for HH. Also, please ask pt who is going to be her new primary care provider

## 2018-09-08 NOTE — Telephone Encounter (Signed)
Verbal orders for in home nursing care and for extention of PT for 1 wk times 2. Medication that was prescribed by neurology last week was stopped due to increased symptoms of stroke and seizure. B/P has been better since was 120/80 this a.m. Usually higher in evening(145/105 one day last week)

## 2018-09-08 NOTE — Telephone Encounter (Signed)
Physical therapist stated today vitials are normal, blood pressure varies.  Patient saw neurology last week.  New meds were started last Thursday. Patient had symptoms of stroke or seizure and has stopped on the 10/12.  Syptoms have reduced.  Need info on who neruology is, need current mediation, and verbal order for home health address to blood pressure and medication.  On current last visit with patient. Threasa Alpha ph# [phone]

## 2018-09-08 NOTE — Telephone Encounter (Signed)
Laura [name] has been informed of ok and pt has appt next week 09/13/18 here

## 2018-09-13 ENCOUNTER — Ambulatory Visit (INDEPENDENT_AMBULATORY_CARE_PROVIDER_SITE_OTHER): Payer: Medicare Other | Admitting: Endocrinology

## 2018-09-13 ENCOUNTER — Encounter: Payer: Self-pay | Admitting: Endocrinology

## 2018-09-13 ENCOUNTER — Ambulatory Visit: Payer: Self-pay | Admitting: Endocrinology

## 2018-09-13 VITALS — BP 128/82 | HR 82 | Ht 60.5 in | Wt 130.0 lb

## 2018-09-13 DIAGNOSIS — I63522 Cerebral infarction due to unspecified occlusion or stenosis of left anterior cerebral artery: Secondary | ICD-10-CM | POA: Diagnosis not present

## 2018-09-13 DIAGNOSIS — M810 Age-related osteoporosis without current pathological fracture: Secondary | ICD-10-CM

## 2018-09-13 DIAGNOSIS — Z0000 Encounter for general adult medical examination without abnormal findings: Secondary | ICD-10-CM | POA: Diagnosis not present

## 2018-09-13 DIAGNOSIS — R739 Hyperglycemia, unspecified: Secondary | ICD-10-CM

## 2018-09-13 LAB — HEMOGLOBIN A1C: Hgb A1c MFr Bld: 7.3 % — ABNORMAL HIGH (ref 4.6–6.5)

## 2018-09-13 LAB — VITAMIN D 25 HYDROXY (VIT D DEFICIENCY, FRACTURES): VITD: 45.26 ng/mL (ref 30.00–100.00)

## 2018-09-13 NOTE — Patient Instructions (Signed)
blood tests are requested for you today.  We'll let you know about the results. Please consider these measures for your health:  minimize alcohol.  Do not use tobacco products.  Have a colonoscopy at least every 10 years from age 75.  Keep firearms safely stored.  Always use seat belts.  have working smoke alarms in your home.  See an eye doctor and dentist regularly.  Never drive under the influence of alcohol or drugs (including prescription drugs).  Those with fair skin should take precautions against the sun, and should carefully examine their skin once per month, for any new or changed moles.  It is critically important to prevent falling down (keep floor areas well-lit, dry, and free of loose objects.  If you have a cane, walker, or wheelchair, you should use it, even for short trips around the house.  Wear flat-soled shoes.  Also, try not to rush) Best wishes with your new primary care provider.   For your safety, you should have someone with you from 9 AM-7 PM.

## 2018-09-13 NOTE — Progress Notes (Signed)
[facility] says DNR

## 2018-09-13 NOTE — Progress Notes (Signed)
Subjective:    Patient ID: Laura [name], female    DOB: September 09, 1941, 77 y.o.   MRN: [id number]  HPI Osteoporosis: denies numbness.  This is a stable problem. Hypothyroidism: denies dry skin.  This is a stable problem. HTN: HH nurse has noted this to be variable.  She is off the metoprolol now.  she brings a record of her BP's which I have reviewed today.  It varies from 118-152/77-85.  This is a stable problem. Past Medical History:  Diagnosis Date  . Anxiety state, unspecified 10/10/2007  . Arthritis   . ASYMPTOMATIC POSTMENOPAUSAL STATUS 04/15/2009  . Cataract    removed both eyes   . Deaf   . Dermatitis   . Headache(784.0) 08/27/2008  . HYPERLIPIDEMIA 06/22/2007   rx refused  . HYPERTENSION 05/07/2008  . HYPOTHYROIDISM 04/15/2009  . Mild mental retardation   . 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 RESTLESS LEG SYNDROME 10/01/2008  . Stroke (HCC)   . TACHYCARDIA 08/27/2008  . TREMOR 11/21/2007  . TRIGGER FINGER 10/30/2009    Past Surgical History:  Procedure Laterality Date  . CATARACT EXTRACTION, BILATERAL    . COLONOSCOPY      Social History   Socioeconomic History  . Marital status: Single    Spouse name: Not on file  . Number of children: Not on file  . Years of education: Not on file  . Highest education level: Not on file  Occupational History  . Occupation: Disabled    Associate Professor: UNEMPLOYED  Social Needs  . Financial resource strain: Not on file  . Food insecurity:    Worry: Not on file    Inability: Not on file  . Transportation needs:    Medical: Not on file    Non-medical: Not on file  Tobacco Use  . Smoking status: Never Smoker  . Smokeless tobacco: Never Used  Substance and Sexual Activity  . Alcohol use: No  . Drug use: No  . Sexual activity: Not on file  Lifestyle  . Physical activity:    Days per week: Not on file    Minutes per session: Not on file  . Stress: Not on file  Relationships  . Social connections:    Talks on phone: Not  on file    Gets together: Not on file    Attends religious service: Not on file    Active member of club or organization: Not on file    Attends meetings of clubs or organizations: Not on file    Relationship status: Not on file  . Intimate partner violence:    Fear of current or ex partner: Not on file    Emotionally abused: Not on file    Physically abused: Not on file    Forced sexual activity: Not on file  Other Topics Concern  . Not on file  Social History Narrative   Lives alone   Never married   Physical activity is very good   She has never had a driver's license   Does not have a phone at home     Current Outpatient Medications on File Prior to Visit  Medication Sig Dispense Refill  . acetaminophen (TYLENOL) 325 MG tablet Take 1-2 tablets (325-650 mg total) by mouth every 4 (four) hours as needed for mild pain.    Marland Kitchen alendronate (FOSAMAX) 70 MG tablet TAKE 1 TABLET BY MOUTH ONCE A WEEK .  TAKE  WITH  A  FULL  GLASS  OF  WATER  ON  AN  EMPTY  STOMACH 4 tablet 2  . aspirin 325 MG EC tablet Take 1 tablet (325 mg total) by mouth daily. STOP TAKING ASPIRIN AFTER 10/18/18 90 tablet 0  . atorvastatin (LIPITOR) 80 MG tablet Take 1 tablet (80 mg total) by mouth daily at 6 PM. 90 tablet 3  . cholecalciferol (VITAMIN D) 1000 UNITS tablet Take 1,000 Units by mouth daily.    . clopidogrel (PLAVIX) 75 MG tablet Take 1 tablet (75 mg total) by mouth daily. 90 tablet 3  . FLUAD 0.5 ML SUSY ADM 0.5ML IM UTD  0  . levETIRAcetam (KEPPRA) 500 MG tablet Take 1 tablet (500 mg total) by mouth 2 (two) times daily. 180 tablet 3  . levothyroxine (SYNTHROID, LEVOTHROID) 75 MCG tablet Take 1 tablet (75 mcg total) by mouth daily. 90 tablet 0  . loratadine (CLARITIN) 10 MG tablet Take 1 tablet (10 mg total) by mouth daily as needed for allergies.    . Multiple Vitamin (MULTIVITAMIN) tablet Take 1 tablet by mouth daily.      Marland Kitchen triamcinolone cream (KENALOG) 0.1 % APPLY  CREAM EXTERNALLY THREE TIMES DAILY AS  NEEDED FOR ITCHING 80 g 2   No current facility-administered medications on file prior to visit.     No Known Allergies  Family History  Problem Relation Age of Onset  . Thyroid disease Neg Hx   . Colon cancer Neg Hx   . Colon polyps Neg Hx   . Rectal cancer Neg Hx   . Stomach cancer Neg Hx     BP 128/82   Pulse 82   Ht 5' 0.5" (1.537 m)   Wt 130 lb (59 kg)   SpO2 99%   BMI 24.97 kg/m    Review of Systems Denies back pain.  No weight change.      Objective:   Physical Exam VITAL SIGNS:  See vs page GENERAL: no distress Gait: steady, with a walker.    Lab Results  Component Value Date   PTH 27 03/23/2017   CALCIUM 8.8 (L) 07/29/2018   CAION 0.95 (L) 04/06/2017   PHOS 1.7 (L) 04/09/2017       Assessment & Plan:  Osteoporosis: recheck labs today Hypothyroidism: recheck today HTN, labile, but well-controlled now: Please continue the same medication  Subjective:   Patient here for Medicare annual wellness visit and management of other chronic and acute problems.     Risk factors: advanced age    Roster of Physicians Providing Medical Care to Patient:  See "snapshot"   Activities of Daily Living: In your present state of health, do you have any difficulty performing the following activities (lives at [facility], independent living)?:  Preparing food and eating?: yes--food is prepared for her  Bathing yourself: No  Getting dressed: No  Using the toilet: No  Moving around from place to place: no--she uses walker In the past year have you fallen or had a near fall?: occasionally  Home Safety: Has smoke detector and wears seat belts. No excess sun exposure.    Opioid Use: none   Diet and Exercise  Current exercise habits: limited by health probs Dietary issues discussed: pt is given a healthy diet   Depression Screen  Q1: Over the past two weeks, have you felt down, depressed or hopeless? yes  Q2: Over the past two weeks, have you felt little  interest or pleasure in doing things? yes  The following portions of the patient's history were reviewed and  updated as appropriate: allergies, current medications, past family history, past medical history, past social history, past surgical history and problem list.   Review of Systems  No change in chronic hearing loss, and visual loss.   Objective:   Vision:  See [facility] done today Hearing: grossly normal Body mass index:  See vs page Msk: pt easily and quickly performs "get-up-and-go" from a sitting position.  Cognitive Impairment Assessment: cognition, memory and judgment appear normal.  remembers 1/3 at 5 minutes.  excellent recall.  Is unable to write a sentence--she writes random words.  alert and oriented to self and 09/12/18.    Assessment:   Medicare wellness utd on preventive parameters    Plan:   During the course of the visit the patient was educated and counseled about appropriate screening and preventive services including:        Fall prevention is advised today  Screening mammography is declined Bone densitometry screening is declined Diabetes screening is done today Nutrition counseling is offered  advanced directives/end of life addressed today:  see healthcare directives hyperlink  Vaccines are updated as needed  Patient Instructions (the written plan) was given to the patient.

## 2018-09-14 DIAGNOSIS — F840 Autistic disorder: Secondary | ICD-10-CM | POA: Diagnosis not present

## 2018-09-14 DIAGNOSIS — I69351 Hemiplegia and hemiparesis following cerebral infarction affecting right dominant side: Secondary | ICD-10-CM | POA: Diagnosis not present

## 2018-09-14 DIAGNOSIS — I10 Essential (primary) hypertension: Secondary | ICD-10-CM | POA: Diagnosis not present

## 2018-09-14 DIAGNOSIS — G40909 Epilepsy, unspecified, not intractable, without status epilepticus: Secondary | ICD-10-CM | POA: Diagnosis not present

## 2018-09-14 DIAGNOSIS — M1990 Unspecified osteoarthritis, unspecified site: Secondary | ICD-10-CM | POA: Diagnosis not present

## 2018-09-14 DIAGNOSIS — H9193 Unspecified hearing loss, bilateral: Secondary | ICD-10-CM | POA: Diagnosis not present

## 2018-09-14 LAB — PTH, INTACT AND CALCIUM
CALCIUM: 8.6 mg/dL (ref 8.6–10.4)
PTH: 30 pg/mL (ref 14–64)

## 2018-09-15 ENCOUNTER — Ambulatory Visit: Payer: Self-pay | Admitting: Endocrinology

## 2018-09-15 DIAGNOSIS — I69351 Hemiplegia and hemiparesis following cerebral infarction affecting right dominant side: Secondary | ICD-10-CM | POA: Diagnosis not present

## 2018-09-15 DIAGNOSIS — H9193 Unspecified hearing loss, bilateral: Secondary | ICD-10-CM | POA: Diagnosis not present

## 2018-09-15 DIAGNOSIS — G40909 Epilepsy, unspecified, not intractable, without status epilepticus: Secondary | ICD-10-CM | POA: Diagnosis not present

## 2018-09-15 DIAGNOSIS — F840 Autistic disorder: Secondary | ICD-10-CM | POA: Diagnosis not present

## 2018-09-15 DIAGNOSIS — I10 Essential (primary) hypertension: Secondary | ICD-10-CM | POA: Diagnosis not present

## 2018-09-15 DIAGNOSIS — M1990 Unspecified osteoarthritis, unspecified site: Secondary | ICD-10-CM | POA: Diagnosis not present

## 2018-09-16 ENCOUNTER — Encounter: Payer: Self-pay | Admitting: Endocrinology

## 2018-09-16 ENCOUNTER — Other Ambulatory Visit: Payer: Self-pay | Admitting: Endocrinology

## 2018-09-16 MED ORDER — METFORMIN HCL ER 500 MG PO TB24: 500.0000 mg | ORAL_TABLET | Freq: Every day | ORAL | 3 refills | Status: AC

## 2018-09-21 DIAGNOSIS — I10 Essential (primary) hypertension: Secondary | ICD-10-CM | POA: Diagnosis not present

## 2018-09-21 DIAGNOSIS — I69351 Hemiplegia and hemiparesis following cerebral infarction affecting right dominant side: Secondary | ICD-10-CM | POA: Diagnosis not present

## 2018-09-21 DIAGNOSIS — M1990 Unspecified osteoarthritis, unspecified site: Secondary | ICD-10-CM | POA: Diagnosis not present

## 2018-09-21 DIAGNOSIS — H9193 Unspecified hearing loss, bilateral: Secondary | ICD-10-CM | POA: Diagnosis not present

## 2018-09-21 DIAGNOSIS — F840 Autistic disorder: Secondary | ICD-10-CM | POA: Diagnosis not present

## 2018-09-21 DIAGNOSIS — G40909 Epilepsy, unspecified, not intractable, without status epilepticus: Secondary | ICD-10-CM | POA: Diagnosis not present

## 2018-09-22 ENCOUNTER — Telehealth: Payer: Self-pay | Admitting: Endocrinology

## 2018-09-22 NOTE — Telephone Encounter (Signed)
[name] (Patient's POA) ph# [phone] called re: she picked up medication from the pharmacy and Metformin was included in the medications, however Metformin is not on patient's medication list. Please call [name] at the above ph# to advise as to why Metformin was added to patient's medications.

## 2018-09-22 NOTE — Telephone Encounter (Signed)
This was rx'ed because a1c was higher.  This was covered on letter sent with results, but perhaps pt did not relay this info to patient.

## 2018-09-22 NOTE — Telephone Encounter (Signed)
Please refer to POA's concern below. I spoke with her and she was not aware that the pt needed to be on this medication. She wants to be certain that you DO in fact want her on this medication. Please advise.

## 2018-09-23 NOTE — Telephone Encounter (Signed)
POA returned Ammie's call and I went over Ellison's message. She understood and stated she will assure pt takes medication. She had no additional questions at this time.

## 2018-09-23 NOTE — Telephone Encounter (Signed)
Please call POA

## 2018-09-23 NOTE — Telephone Encounter (Signed)
Called POA to inform of Dr. George Hugh response. LVM requesting returned call.

## 2018-09-26 DIAGNOSIS — R7303 Prediabetes: Secondary | ICD-10-CM | POA: Diagnosis not present

## 2018-09-26 DIAGNOSIS — F840 Autistic disorder: Secondary | ICD-10-CM | POA: Diagnosis not present

## 2018-09-26 DIAGNOSIS — I639 Cerebral infarction, unspecified: Secondary | ICD-10-CM | POA: Diagnosis not present

## 2018-09-26 DIAGNOSIS — E785 Hyperlipidemia, unspecified: Secondary | ICD-10-CM | POA: Diagnosis not present

## 2018-09-26 DIAGNOSIS — G40909 Epilepsy, unspecified, not intractable, without status epilepticus: Secondary | ICD-10-CM | POA: Diagnosis not present

## 2018-09-26 DIAGNOSIS — E039 Hypothyroidism, unspecified: Secondary | ICD-10-CM | POA: Diagnosis not present

## 2018-09-26 DIAGNOSIS — E786 Lipoprotein deficiency: Secondary | ICD-10-CM | POA: Diagnosis not present

## 2018-09-28 DIAGNOSIS — F840 Autistic disorder: Secondary | ICD-10-CM | POA: Diagnosis not present

## 2018-09-28 DIAGNOSIS — I69351 Hemiplegia and hemiparesis following cerebral infarction affecting right dominant side: Secondary | ICD-10-CM | POA: Diagnosis not present

## 2018-09-28 DIAGNOSIS — I10 Essential (primary) hypertension: Secondary | ICD-10-CM | POA: Diagnosis not present

## 2018-09-28 DIAGNOSIS — M1990 Unspecified osteoarthritis, unspecified site: Secondary | ICD-10-CM | POA: Diagnosis not present

## 2018-09-28 DIAGNOSIS — H9193 Unspecified hearing loss, bilateral: Secondary | ICD-10-CM | POA: Diagnosis not present

## 2018-09-28 DIAGNOSIS — G40909 Epilepsy, unspecified, not intractable, without status epilepticus: Secondary | ICD-10-CM | POA: Diagnosis not present

## 2018-10-24 DIAGNOSIS — Z111 Encounter for screening for respiratory tuberculosis: Secondary | ICD-10-CM | POA: Diagnosis not present

## 2019-04-21 ENCOUNTER — Ambulatory Visit: Payer: Self-pay | Admitting: Neurology

## 2019-05-17 ENCOUNTER — Encounter (HOSPITAL_COMMUNITY): Payer: Self-pay | Admitting: Radiology

## 2019-05-17 ENCOUNTER — Emergency Department (HOSPITAL_COMMUNITY): Payer: Medicare Other

## 2019-05-17 ENCOUNTER — Emergency Department (HOSPITAL_COMMUNITY)
Admission: EM | Admit: 2019-05-17 | Discharge: 2019-05-17 | Disposition: A | Discharge status: Home / Self Care | Payer: Medicare Other | Attending: Emergency Medicine | Admitting: Emergency Medicine

## 2019-05-17 DIAGNOSIS — W1839XA Other fall on same level, initial encounter: Secondary | ICD-10-CM | POA: Insufficient documentation

## 2019-05-17 DIAGNOSIS — E119 Type 2 diabetes mellitus without complications: Secondary | ICD-10-CM | POA: Insufficient documentation

## 2019-05-17 DIAGNOSIS — Y9389 Activity, other specified: Secondary | ICD-10-CM | POA: Diagnosis not present

## 2019-05-17 DIAGNOSIS — Y999 Unspecified external cause status: Secondary | ICD-10-CM | POA: Insufficient documentation

## 2019-05-17 DIAGNOSIS — Z79899 Other long term (current) drug therapy: Secondary | ICD-10-CM | POA: Insufficient documentation

## 2019-05-17 DIAGNOSIS — E039 Hypothyroidism, unspecified: Secondary | ICD-10-CM | POA: Diagnosis not present

## 2019-05-17 DIAGNOSIS — Z8673 Personal history of transient ischemic attack (TIA), and cerebral infarction without residual deficits: Secondary | ICD-10-CM | POA: Diagnosis not present

## 2019-05-17 DIAGNOSIS — Z7982 Long term (current) use of aspirin: Secondary | ICD-10-CM | POA: Insufficient documentation

## 2019-05-17 DIAGNOSIS — Y92128 Other place in nursing home as the place of occurrence of the external cause: Secondary | ICD-10-CM | POA: Insufficient documentation

## 2019-05-17 DIAGNOSIS — S199XXA Unspecified injury of neck, initial encounter: Secondary | ICD-10-CM | POA: Diagnosis not present

## 2019-05-17 DIAGNOSIS — I10 Essential (primary) hypertension: Secondary | ICD-10-CM | POA: Insufficient documentation

## 2019-05-17 DIAGNOSIS — Z7902 Long term (current) use of antithrombotics/antiplatelets: Secondary | ICD-10-CM | POA: Diagnosis not present

## 2019-05-17 DIAGNOSIS — S0990XA Unspecified injury of head, initial encounter: Secondary | ICD-10-CM

## 2019-05-17 DIAGNOSIS — Z7984 Long term (current) use of oral hypoglycemic drugs: Secondary | ICD-10-CM | POA: Diagnosis not present

## 2019-05-17 DIAGNOSIS — S098XXA Other specified injuries of head, initial encounter: Secondary | ICD-10-CM | POA: Diagnosis present

## 2019-05-17 DIAGNOSIS — S0101XA Laceration without foreign body of scalp, initial encounter: Secondary | ICD-10-CM | POA: Diagnosis not present

## 2019-05-17 DIAGNOSIS — W19XXXA Unspecified fall, initial encounter: Secondary | ICD-10-CM | POA: Diagnosis not present

## 2019-05-17 LAB — URINALYSIS, ROUTINE W REFLEX MICROSCOPIC
Bilirubin Urine: NEGATIVE
Glucose, UA: NEGATIVE mg/dL
Hgb urine dipstick: NEGATIVE
Ketones, ur: NEGATIVE mg/dL
Leukocytes,Ua: NEGATIVE
Nitrite: NEGATIVE
Protein, ur: NEGATIVE mg/dL
Specific Gravity, Urine: 1.006 (ref 1.005–1.030)
pH: 8 (ref 5.0–8.0)

## 2019-05-17 LAB — BASIC METABOLIC PANEL
Anion gap: 10 (ref 5–15)
BUN: 16 mg/dL (ref 8–23)
CO2: 26 mmol/L (ref 22–32)
Calcium: 8.5 mg/dL — ABNORMAL LOW (ref 8.9–10.3)
Chloride: 101 mmol/L (ref 98–111)
Creatinine, Ser: 0.68 mg/dL (ref 0.44–1.00)
GFR calc Af Amer: >60 mL/min (ref >60)
GFR calc non Af Amer: >60 mL/min (ref >60)
Glucose, Bld: 111 mg/dL — ABNORMAL HIGH (ref 70–99)
Potassium: 4.1 mmol/L (ref 3.5–5.1)
Sodium: 137 mmol/L (ref 135–145)

## 2019-05-17 LAB — CBC WITH DIFFERENTIAL/PLATELET
Abs Immature Granulocytes: 0.04 10*3/uL (ref 0.00–0.07)
Basophils Absolute: 0.1 10*3/uL (ref 0.0–0.1)
Basophils Relative: 1 %
Eosinophils Absolute: 0.2 10*3/uL (ref 0.0–0.5)
Eosinophils Relative: 4 %
HCT: 42.4 % (ref 36.0–46.0)
Hemoglobin: 13.6 g/dL (ref 12.0–15.0)
Immature Granulocytes: 1 %
Lymphocytes Relative: 16 %
Lymphs Abs: 1.1 10*3/uL (ref 0.7–4.0)
MCH: 30.9 pg (ref 26.0–34.0)
MCHC: 32.1 g/dL (ref 30.0–36.0)
MCV: 96.4 fL (ref 80.0–100.0)
Monocytes Absolute: 0.8 10*3/uL (ref 0.1–1.0)
Monocytes Relative: 12 %
Neutro Abs: 4.5 10*3/uL (ref 1.7–7.7)
Neutrophils Relative %: 66 %
Platelets: 208 10*3/uL (ref 150–400)
RBC: 4.4 MIL/uL (ref 3.87–5.11)
RDW: 12.2 % (ref 11.5–15.5)
WBC: 6.6 10*3/uL (ref 4.0–10.5)
nRBC: 0 % (ref 0.0–0.2)

## 2019-05-17 MED ORDER — LIDOCAINE HCL 2 % IJ SOLN
INTRAMUSCULAR | Status: AC
  Administered 2019-05-17: 11:00:00 400 mg
  Filled 2019-05-17: qty 20

## 2019-05-17 MED ORDER — ACETAMINOPHEN 325 MG PO TABS
650.0000 mg | ORAL_TABLET | Freq: Once | ORAL | Status: AC
  Administered 2019-05-17: 10:00:00 650 mg via ORAL
  Filled 2019-05-17: qty 2

## 2019-05-17 MED ORDER — SODIUM CHLORIDE 0.9 % IV BOLUS
500.0000 mL | Freq: Once | INTRAVENOUS | Status: AC
  Administered 2019-05-17: 500 mL via INTRAVENOUS

## 2019-05-17 NOTE — ED Provider Notes (Signed)
[facility] Provider Note   CSN: [id number] Arrival date & time: 05/17/19  0843    History   Chief Complaint Chief Complaint  Patient presents with   Fall   Head Laceration    HPI Laura [name] is a 78 y.o. female.     Laura [name] is a [age] y.o. female with a history of hypertension, hyperlipidemia, stroke, intracranial bleed, diabetes, who presents to the emergency department from assisted living facility after unwitnessed fall.  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  Patient had unwitnessed fall in the hallway.  She does report feeling a bit dizzy this morning.  Patient reports that she was trying to sit down when someone startled her and she fell backwards hitting her head.  She has a hematoma and laceration noted over the occiput of the head.  Her tetanus is up-to-date.  She is on Plavix but is not on any other blood thinners.  She has a c-collar in place.  Denies any pain elsewhere, no chest pain or shortness of breath.  No abdominal pain.  No back pain.  No pain over her hips or extremities.  Nothing for pain prior to arrival.  She reports overall she feels fine now.      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/2009   RESTLESS LEG SYNDROME 10/01/2008   Stroke (HCC)    TACHYCARDIA 08/27/2008   TREMOR 11/21/2007   TRIGGER FINGER 10/30/2009    Patient Active Problem List   Diagnosis Date Noted   Acute ischemic left anterior cerebral artery (ACA) stroke (HCC) 07/19/2018   History of CVA (cerebrovascular accident)  without residual deficits    Sinus tachycardia    Diabetes mellitus type 2 in nonobese [facility](HCC)    Acute encephalopathy 07/14/2018   Autism spectrum disorder 07/14/2018   Deaf 07/14/2018   Bradycardia 05/16/2018   Cognitive changes 06/03/2017   Anemia 05/18/2017   CVA (cerebral vascular accident) (HCC) 04/06/2017   Bleeding, intracranial (HCC)    Sepsis (HCC)    Subdural hematoma (HCC)    Trauma    Osteoporosis 03/23/2017   Menopause 03/25/2016   Hyperglycemia 08/07/2015   Contusion of left chest wall 02/05/2014   Routine general medical examination at a health care facility 01/19/2014   Left ventricular hypertrophy 07/15/2011   Encounter for long-term (current) use of other medications 06/29/2011   PARONYCHIA, 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      Home Medications    Prior to Admission medications   Medication Sig Start Date End Date Taking? Authorizing Provider  acetaminophen (TYLENOL) 500 MG tablet Take 1,000 mg by mouth at bedtime. May also take 2 tablets (1000mg ) every 6 hours as needed for pain   Yes [provider]  alendronate (FOSAMAX) 70 MG tablet TAKE 1 TABLET BY MOUTH ONCE A WEEK .  TAKE  WITH  A  FULL  GLASS  OF  WATER  ON  AN  EMPTY  STOMACH Patient taking differently: Take 70 mg by mouth once a week. Every Saturday 08/22/18  Yes Romero BellingEllison, Sean, MD  aspirin EC 81 MG tablet Take 81 mg by mouth daily.   Yes [provider]  atorvastatin (LIPITOR)  80 MG tablet Take 1 tablet (80 mg total) by mouth daily at 6 PM. 08/30/18  Yes Van ClinesAquino, Karen M, MD  camphor-menthol [facility](SARNA) lotion Apply 1 application topically at bedtime as needed for itching. Apply to both forearms and lower legs   Yes [provider]  cholecalciferol (VITAMIN D) 1000 UNITS tablet Take 1,000 Units by mouth daily.   Yes [provider]  clonazePAM (KLONOPIN) 1 MG tablet Take 0.5 mg by mouth 2 (two) times daily as needed for anxiety.   Yes [provider]  clopidogrel (PLAVIX) 75 MG tablet Take 1 tablet (75 mg total) by mouth daily. 08/30/18  Yes Van ClinesAquino, Karen M, MD  levETIRAcetam (KEPPRA) 500 MG tablet Take 1 tablet (500 mg total) by mouth 2 (two) times daily. 08/30/18  Yes Van ClinesAquino, Karen M, MD  levothyroxine (SYNTHROID) 50 MCG tablet Take 50 mcg by mouth daily before breakfast.   Yes [provider]  metFORMIN (GLUCOPHAGE-XR) 500 MG 24 hr tablet Take 1 tablet (500 mg total) by mouth daily with breakfast. 09/16/18  Yes Romero BellingEllison, Sean, MD  Multiple Vitamin (MULTIVITAMIN) tablet Take 1 tablet by mouth daily.     Yes [provider]  Propylene Glycol (SYSTANE BALANCE OP) Place 1 drop into both eyes 3 (three) times daily.   Yes [provider]  rOPINIRole (REQUIP) 2 MG tablet Take 2 mg by mouth at bedtime.   Yes [provider]  sertraline (ZOLOFT) 25 MG tablet Take 25 mg by mouth daily.   Yes [provider]  acetaminophen (TYLENOL) 325 MG tablet Take 1-2 tablets (325-650 mg total) by mouth every 4 (four) hours as needed for mild pain. Patient not taking: Reported on 05/17/2019 07/21/18   Love, Evlyn KannerPamela S, PA-C  levothyroxine (SYNTHROID, LEVOTHROID) 75 MCG tablet Take 1 tablet (75 mcg total) by mouth daily. Patient not taking: Reported on 05/17/2019 08/22/18   Romero BellingEllison, Sean, MD  loratadine (CLARITIN) 10 MG tablet Take 1 tablet (10 mg total) by mouth daily as needed for allergies. Patient not taking: Reported on 05/17/2019  07/26/18   Love, Evlyn KannerPamela S, PA-C  triamcinolone cream (KENALOG) 0.1 % APPLY  CREAM EXTERNALLY THREE TIMES DAILY AS NEEDED FOR ITCHING Patient not taking: Reported on 05/17/2019 07/26/18   Love, Evlyn KannerPamela S, PA-C    Family History Family History  Problem Relation Age of Onset   Thyroid disease Neg Hx    Colon cancer Neg Hx    Colon polyps Neg Hx    Rectal cancer Neg Hx    Stomach cancer Neg Hx     Social History Social History   Tobacco Use   Smoking status: Never Smoker   Smokeless tobacco: Never Used  Substance Use Topics   Alcohol use: No   Drug use: No     Allergies   Patient has no known allergies.   Review of  Systems Review of Systems  Constitutional: Negative for chills and fever.  HENT: Negative.   Eyes: Negative for visual disturbance.  Respiratory: Negative for cough and shortness of breath.   Cardiovascular: Negative for chest pain.  Gastrointestinal: Negative for abdominal pain.  Genitourinary: Negative for dysuria and frequency.  Musculoskeletal: Negative for arthralgias, back pain and myalgias.  Skin: Positive for wound. Negative for color change.  Neurological: Positive for dizziness and headaches. Negative for tremors, seizures, syncope, facial asymmetry, speech difficulty, light-headedness and numbness.     Physical Exam Updated Vital Signs BP (!) 179/85 (BP Location: Right Arm)    Pulse 68    Temp (!) 97.4 F (36.3 C) (Oral)    Resp 17    SpO2 97%   Physical Exam Vitals signs and nursing note reviewed.  Constitutional:      General: She is not in acute distress.    Appearance: Normal appearance. She is well-developed and normal weight. She is not diaphoretic.  HENT:     Head: Normocephalic.     Comments: Laceration with surrounding hematoma to the left occiput and parietal region of the head, bleeding controlled.  No palpable surrounding step-off or deformity of the skull.  No battle sign or raccoon eyes.    Nose: Nose normal.      Mouth/Throat:     Mouth: Mucous membranes are moist.     Pharynx: Oropharynx is clear.  Eyes:     General:        Right eye: No discharge.        Left eye: No discharge.     Extraocular Movements: Extraocular movements intact.     Conjunctiva/sclera: Conjunctivae normal.     Pupils: Pupils are equal, round, and reactive to light.  Neck:     Musculoskeletal: Neck supple.     Comments: C-collar in place. Cardiovascular:     Rate and Rhythm: Normal rate and regular rhythm.     Heart sounds: Normal heart sounds. No murmur. No friction rub. No gallop.   Pulmonary:     Effort: Pulmonary effort is normal. No respiratory distress.     Breath sounds: Normal breath sounds. No wheezing or rales.     Comments: Respirations equal and unlabored, patient able to speak in full sentences, lungs clear to auscultation bilaterally, good chest expansion.  Chest nontender to palpation throughout without palpable deformity or crepitus. Abdominal:     General: Bowel sounds are normal. There is no distension.     Palpations: Abdomen is soft. There is no mass.     Tenderness: There is no abdominal tenderness. There is no guarding.     Comments: Abdomen soft, nondistended, nontender to palpation in all quadrants without guarding or peritoneal signs  Musculoskeletal:        General: No deformity.     Comments: T-spine and L-spine nontender to palpation at midline. Patient moves all extremities without difficulty. All joints supple and easily movable, no erythema, swelling or palpable deformity, all compartments soft.  Skin:    General: Skin is warm and dry.     Capillary Refill: Capillary refill takes less than 2 seconds.  Neurological:     Mental Status: She is alert.     Coordination: Coordination normal.     Comments: Nonverbal but able to communicate through ASL interpreter, able to follow commands CN III-XII intact Normal strength in upper and lower extremities bilaterally including dorsiflexion and  plantar flexion, strong and equal grip strength Sensation normal to light  and sharp touch Moves extremities without ataxia, coordination intact   Psychiatric:        Mood and Affect: Mood normal.        Behavior: Behavior normal.      ED Treatments / Results  Labs (all labs ordered are listed, but only abnormal results are displayed) Labs Reviewed  BASIC METABOLIC PANEL - Abnormal; Notable for the following components:      Result Value   Glucose, Bld 111 (*)    Calcium 8.5 (*)    All other components within normal limits  URINALYSIS, ROUTINE W REFLEX MICROSCOPIC - Abnormal; Notable for the following components:   Color, Urine STRAW (*)    All other components within normal limits  CBC WITH DIFFERENTIAL/PLATELET    EKG EKG Interpretation  Date/Time:  Wednesday May 17 2019 09:49:33 EDT Ventricular Rate:  71 PR Interval:    QRS Duration: 133 QT Interval:  471 QTC Calculation: 512 R Axis:   -51 Text Interpretation:  Sinus rhythm.  Left bundle branch block No STEMI  Confirmed by Alona BeneLong, Joshua [phone]([id number]) on 05/17/2019 10:17:01 AM   Radiology Ct Head Wo Contrast  Result Date: 05/17/2019 CLINICAL DATA:  Status post fall, probable loss of consciousness EXAM: CT HEAD WITHOUT CONTRAST CT CERVICAL SPINE WITHOUT CONTRAST TECHNIQUE: Multidetector CT imaging of the head and cervical spine was performed following the standard protocol without intravenous contrast. Multiplanar CT image reconstructions of the cervical spine were also generated. COMPARISON:  None. FINDINGS: CT HEAD FINDINGS Brain: No evidence of acute infarction, hemorrhage, extra-axial collection, ventriculomegaly, or mass effect. Old left temporal lobe infarct with encephalomalacia. Old left basal ganglia lacunar infarct. Generalized cerebral atrophy. Periventricular white matter low attenuation likely secondary to microangiopathy. Vascular: Cerebrovascular atherosclerotic calcifications are noted. Skull: Negative for fracture  or focal lesion. Sinuses/Orbits: Visualized portions of the orbits are unremarkable. Visualized portions of the paranasal sinuses and mastoid air cells are unremarkable. Other: Left parietal scalp soft tissue laceration with swelling. CT CERVICAL SPINE FINDINGS Alignment: Normal. Skull base and vertebrae: No acute fracture. No primary bone lesion or focal pathologic process. Soft tissues and spinal canal: No prevertebral fluid or swelling. No visible canal hematoma. 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 Severe degenerative disease with disc height loss at C4-5 with bilateral uncovertebral degenerative changes, bilateral facet arthropathy and bilateral foraminal stenosis. Mild broad-based disc osteophyte complex at C6-7. Upper chest: Lung apices are clear. Other: No fluid collection or hematoma. No aggressive osseous lesion. IMPRESSION: 1. No acute intracranial pathology. 2.  No acute osseous injury of the cervical spine. 3. Cervical spine spondylosis as described above. Electronically Signed   By: Elige KoHetal  Patel   On: 05/17/2019 11:16   Ct Cervical Spine Wo Contrast  Result Date: 05/17/2019 CLINICAL DATA:  Status post fall, probable loss of consciousness EXAM: CT HEAD WITHOUT CONTRAST CT CERVICAL SPINE WITHOUT CONTRAST TECHNIQUE: Multidetector CT imaging of the head and cervical spine was performed following the standard protocol without intravenous contrast. Multiplanar CT image reconstructions of the cervical spine were also generated. COMPARISON:  None. FINDINGS: CT HEAD FINDINGS Brain: No evidence of acute infarction, hemorrhage, extra-axial collection, ventriculomegaly, or mass effect. Old left temporal lobe infarct with encephalomalacia. Old left basal ganglia lacunar infarct. Generalized cerebral atrophy. Periventricular white matter low attenuation likely secondary to microangiopathy. Vascular: Cerebrovascular atherosclerotic calcifications are noted. Skull:  Negative for fracture or focal lesion. Sinuses/Orbits: Visualized portions of the orbits are unremarkable. Visualized portions of the paranasal  sinuses and mastoid air cells are unremarkable. Other: Left parietal scalp soft tissue laceration with swelling. CT CERVICAL SPINE FINDINGS Alignment: Normal. Skull base and vertebrae: No acute fracture. No primary bone lesion or focal pathologic process. Soft tissues and spinal canal: No prevertebral fluid or swelling. No visible canal hematoma. 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 Severe degenerative disease with disc height loss at C4-5 with bilateral uncovertebral degenerative changes, bilateral facet arthropathy and bilateral foraminal stenosis. Mild broad-based disc osteophyte complex at C6-7. Upper chest: Lung apices are clear. Other: No fluid collection or hematoma. No aggressive osseous lesion. IMPRESSION: 1. No acute intracranial pathology. 2.  No acute osseous injury of the cervical spine. 3. Cervical spine spondylosis as described above. Electronically Signed   By: Elige Ko   On: 05/17/2019 11:16    Procedures .Marland KitchenLaceration Repair  Date/Time: 05/17/2019 10:18 AM Performed by: Dartha Lodge, PA-C Authorized by: Dartha Lodge, PA-C   Consent:    Consent obtained:  Verbal   Consent given by:  Patient   Risks discussed:  Infection, pain, poor wound healing, poor cosmetic result and need for additional repair   Alternatives discussed:  No treatment Anesthesia (see MAR for exact dosages):    Anesthesia method:  Local infiltration   Local anesthetic:  Lidocaine 2% WITH epi Laceration details:    Location:  Scalp   Scalp location:  Occipital   Length (cm):  3   Depth (mm):  5 Repair type:    Repair type:  Simple Pre-procedure details:    Preparation:  Patient was prepped and draped in usual sterile fashion Exploration:    Hemostasis achieved with:  Direct pressure and LET   Wound  exploration: entire depth of wound probed and visualized     Wound extent: areolar tissue violated   Treatment:    Area cleansed with:  Soap and water   Amount of cleaning:  Standard   Irrigation solution:  Tap water Skin repair:    Repair method:  Staples   Number of staples:  6 Approximation:    Approximation:  Close Post-procedure details:    Dressing:  Open (no dressing)   Patient tolerance of procedure:  Tolerated well, no immediate complications   (including critical care time)  Medications Ordered in ED Medications  sodium chloride 0.9 % bolus 500 mL (500 mLs Intravenous New Bag/Given 05/17/19 1048)  acetaminophen (TYLENOL) tablet 650 mg (650 mg Oral Given 05/17/19 1009)  lidocaine (XYLOCAINE) 2 % (with pres) injection (400 mg  Given 05/17/19 1046)     Initial Impression / Assessment and Plan / ED Course  I have reviewed the triage vital signs and the nursing notes.  Pertinent labs & imaging results that were available during my care of the patient were reviewed by me and considered in my medical decision making (see chart for details).  Patient presents after unwitnessed fall at nursing facility, striking the back of her head with subsequent hematoma and laceration, placed in c-collar by EMS.  On arrival she is neurologically intact, hypertensive, all other vitals normal.  She has no evidence of other injuries on exam, no chest wall tenderness, abdominal tenderness or tenderness over her pelvis or extremities.  Will get basic labs, EKG and urinalysis as patient reports some dizziness.  Will get CT of the head and C-spine.  IV fluids given and Tylenol for pain.  Patient's tetanus is up-to-date, after CT scans will plan for laceration repair.  Notified by nursing staff that laceration on the head began to bleed again, at bedside patient had moderate amount of bleeding there was a large hematoma beneath the wound which had to be evacuated with warm compresses and wound wash.  After  this the wound was closed with 6 staples with good hemostasis.  EKG with no significant changes when compared to previous.  Labs overall reassuring with no significant electrolyte derangements, normal renal function, no leukocytosis and normal hemoglobin.  Urinalysis pending.  Patient did have one documented episode of hypoxia at 73% I was in the room while this occurred patient did not have any shortness of breath this occurred while blood pressure cuff was in motion on the same hand and I suspect this is the reason, as oxygen immediately returned to the 90s with good Pleth afterwards.  CT of the head shows previous left temporal and basal ganglia stroke but no evidence of acute intracranial abnormality.  There is a scalp hematoma with staples noted on head CT.  CT of the cervical spine shows some chronic degenerative changes but no acute fracture or malalignment.  Urinalysis without any signs of infection.  Work-up today has been very reassuring, I have discussed this with the patient and HPO way at bedside, ASL interpreter at bedside as well.  At this time feel patient is stable for discharge back to facility, dressing has been placed over scalp laceration.  Will encourage warm compresses to help reduce surrounding hematoma.  Tylenol as needed for pain.  Patient will need staple removal in 7 to 10 days.  Return precautions discussed.  Discharged back to facility in good condition.  Final Clinical Impressions(s) / ED Diagnoses   Final diagnoses:  Fall, initial encounter  Injury of head, initial encounter  Laceration of scalp, initial encounter    ED Discharge Orders    None       Legrand Rams 05/17/19 1223    Maia Plan, MD 05/17/19 2003

## 2019-05-17 NOTE — ED Notes (Signed)
Patient has started bleeding again at the back of her head. RN made PA aware

## 2019-05-17 NOTE — ED Notes (Signed)
Bed: SQ58 Expected date: 05/17/19 Expected time: 8:30 AM Means of arrival:  Comments: Fall

## 2019-05-17 NOTE — ED Notes (Signed)
ED Provider at bedside. 

## 2019-05-17 NOTE — ED Notes (Signed)
While RN was attempting IV start, RN noticed that the C-collar had migrated up and was pressing on the area that was bleeding. RN unable to get c-collar back in appropriate place and bleeding became more pronounced to patient's head. RN removed c-collar as it was causing harm to patient and not appropriately placed. RN obtained EKG and bloodwork and while giving the EKG to MD informed PA that in-person interpretor was now present in room, and that patient's c-collar had caused more bleeding.  PA immediately at bedside to assess bleeding.

## 2019-05-17 NOTE — ED Notes (Signed)
Patient transported to CT 

## 2019-05-17 NOTE — Discharge Instructions (Signed)
CT scans were reassuring, you do have a hematoma and laceration on the back of your head which was repaired with staples, these will need to be removed in about 7-10 days.  You can use warm compresses and ice over the area to help with swelling and pain.  Tylenol every 6 hours as needed for pain.  Your labs and urinalysis are otherwise reassuring.  Please make sure you are eating and drinking well and walking carefully, use any assistive devices as needed.  You were examined today for a head injury and possible concussion.  Sometimes serious problems can develop after a head injury. Please return to the emergency department if you experience any of the following symptoms: Repeated vomiting Headache that gets worse and does not go away Loss of consciousness or inability to stay awake at times when you normally would be able to Getting more confused, restless or agitated Convulsions or seizures Difficulty walking or feeling off balance Weakness or numbness Vision changes

## 2019-05-17 NOTE — ED Triage Notes (Signed)
Staff at [facility] found pt. To have fallen in one of their hallways. She has hematoma/lac. At occiput. She is awake and alert. She is deaf.

## 2019-05-29 DIAGNOSIS — R2689 Other abnormalities of gait and mobility: Secondary | ICD-10-CM | POA: Diagnosis not present

## 2019-05-29 DIAGNOSIS — R2681 Unsteadiness on feet: Secondary | ICD-10-CM | POA: Diagnosis not present

## 2019-05-29 DIAGNOSIS — R41841 Cognitive communication deficit: Secondary | ICD-10-CM | POA: Diagnosis not present

## 2019-05-29 DIAGNOSIS — R296 Repeated falls: Secondary | ICD-10-CM | POA: Diagnosis not present

## 2019-05-29 DIAGNOSIS — M6281 Muscle weakness (generalized): Secondary | ICD-10-CM | POA: Diagnosis not present

## 2019-05-29 DIAGNOSIS — R488 Other symbolic dysfunctions: Secondary | ICD-10-CM | POA: Diagnosis not present

## 2019-05-29 DIAGNOSIS — Z9181 History of falling: Secondary | ICD-10-CM | POA: Diagnosis not present

## 2019-05-31 DIAGNOSIS — R2681 Unsteadiness on feet: Secondary | ICD-10-CM | POA: Diagnosis not present

## 2019-05-31 DIAGNOSIS — R41841 Cognitive communication deficit: Secondary | ICD-10-CM | POA: Diagnosis not present

## 2019-05-31 DIAGNOSIS — R296 Repeated falls: Secondary | ICD-10-CM | POA: Diagnosis not present

## 2019-05-31 DIAGNOSIS — R2689 Other abnormalities of gait and mobility: Secondary | ICD-10-CM | POA: Diagnosis not present

## 2019-05-31 DIAGNOSIS — M6281 Muscle weakness (generalized): Secondary | ICD-10-CM | POA: Diagnosis not present

## 2019-05-31 DIAGNOSIS — R488 Other symbolic dysfunctions: Secondary | ICD-10-CM | POA: Diagnosis not present

## 2019-06-01 DIAGNOSIS — R488 Other symbolic dysfunctions: Secondary | ICD-10-CM | POA: Diagnosis not present

## 2019-06-01 DIAGNOSIS — R2689 Other abnormalities of gait and mobility: Secondary | ICD-10-CM | POA: Diagnosis not present

## 2019-06-01 DIAGNOSIS — R296 Repeated falls: Secondary | ICD-10-CM | POA: Diagnosis not present

## 2019-06-01 DIAGNOSIS — R41841 Cognitive communication deficit: Secondary | ICD-10-CM | POA: Diagnosis not present

## 2019-06-01 DIAGNOSIS — R2681 Unsteadiness on feet: Secondary | ICD-10-CM | POA: Diagnosis not present

## 2019-06-01 DIAGNOSIS — M6281 Muscle weakness (generalized): Secondary | ICD-10-CM | POA: Diagnosis not present

## 2019-06-02 DIAGNOSIS — R488 Other symbolic dysfunctions: Secondary | ICD-10-CM | POA: Diagnosis not present

## 2019-06-02 DIAGNOSIS — R296 Repeated falls: Secondary | ICD-10-CM | POA: Diagnosis not present

## 2019-06-02 DIAGNOSIS — R2681 Unsteadiness on feet: Secondary | ICD-10-CM | POA: Diagnosis not present

## 2019-06-02 DIAGNOSIS — R2689 Other abnormalities of gait and mobility: Secondary | ICD-10-CM | POA: Diagnosis not present

## 2019-06-02 DIAGNOSIS — R41841 Cognitive communication deficit: Secondary | ICD-10-CM | POA: Diagnosis not present

## 2019-06-02 DIAGNOSIS — M6281 Muscle weakness (generalized): Secondary | ICD-10-CM | POA: Diagnosis not present

## 2019-06-05 DIAGNOSIS — R488 Other symbolic dysfunctions: Secondary | ICD-10-CM | POA: Diagnosis not present

## 2019-06-05 DIAGNOSIS — M6281 Muscle weakness (generalized): Secondary | ICD-10-CM | POA: Diagnosis not present

## 2019-06-05 DIAGNOSIS — R296 Repeated falls: Secondary | ICD-10-CM | POA: Diagnosis not present

## 2019-06-05 DIAGNOSIS — R2681 Unsteadiness on feet: Secondary | ICD-10-CM | POA: Diagnosis not present

## 2019-06-05 DIAGNOSIS — R41841 Cognitive communication deficit: Secondary | ICD-10-CM | POA: Diagnosis not present

## 2019-06-05 DIAGNOSIS — R2689 Other abnormalities of gait and mobility: Secondary | ICD-10-CM | POA: Diagnosis not present

## 2019-06-06 DIAGNOSIS — R2689 Other abnormalities of gait and mobility: Secondary | ICD-10-CM | POA: Diagnosis not present

## 2019-06-06 DIAGNOSIS — R41841 Cognitive communication deficit: Secondary | ICD-10-CM | POA: Diagnosis not present

## 2019-06-06 DIAGNOSIS — R488 Other symbolic dysfunctions: Secondary | ICD-10-CM | POA: Diagnosis not present

## 2019-06-06 DIAGNOSIS — M6281 Muscle weakness (generalized): Secondary | ICD-10-CM | POA: Diagnosis not present

## 2019-06-06 DIAGNOSIS — R296 Repeated falls: Secondary | ICD-10-CM | POA: Diagnosis not present

## 2019-06-06 DIAGNOSIS — R2681 Unsteadiness on feet: Secondary | ICD-10-CM | POA: Diagnosis not present

## 2019-06-07 DIAGNOSIS — R296 Repeated falls: Secondary | ICD-10-CM | POA: Diagnosis not present

## 2019-06-07 DIAGNOSIS — M6281 Muscle weakness (generalized): Secondary | ICD-10-CM | POA: Diagnosis not present

## 2019-06-07 DIAGNOSIS — R41841 Cognitive communication deficit: Secondary | ICD-10-CM | POA: Diagnosis not present

## 2019-06-07 DIAGNOSIS — R488 Other symbolic dysfunctions: Secondary | ICD-10-CM | POA: Diagnosis not present

## 2019-06-07 DIAGNOSIS — R2681 Unsteadiness on feet: Secondary | ICD-10-CM | POA: Diagnosis not present

## 2019-06-07 DIAGNOSIS — R2689 Other abnormalities of gait and mobility: Secondary | ICD-10-CM | POA: Diagnosis not present

## 2019-06-08 DIAGNOSIS — R2689 Other abnormalities of gait and mobility: Secondary | ICD-10-CM | POA: Diagnosis not present

## 2019-06-08 DIAGNOSIS — R488 Other symbolic dysfunctions: Secondary | ICD-10-CM | POA: Diagnosis not present

## 2019-06-08 DIAGNOSIS — M6281 Muscle weakness (generalized): Secondary | ICD-10-CM | POA: Diagnosis not present

## 2019-06-08 DIAGNOSIS — R296 Repeated falls: Secondary | ICD-10-CM | POA: Diagnosis not present

## 2019-06-08 DIAGNOSIS — R41841 Cognitive communication deficit: Secondary | ICD-10-CM | POA: Diagnosis not present

## 2019-06-08 DIAGNOSIS — R2681 Unsteadiness on feet: Secondary | ICD-10-CM | POA: Diagnosis not present

## 2019-06-09 DIAGNOSIS — S0101XD Laceration without foreign body of scalp, subsequent encounter: Secondary | ICD-10-CM | POA: Diagnosis not present

## 2019-06-12 DIAGNOSIS — R488 Other symbolic dysfunctions: Secondary | ICD-10-CM | POA: Diagnosis not present

## 2019-06-12 DIAGNOSIS — R296 Repeated falls: Secondary | ICD-10-CM | POA: Diagnosis not present

## 2019-06-12 DIAGNOSIS — R2681 Unsteadiness on feet: Secondary | ICD-10-CM | POA: Diagnosis not present

## 2019-06-12 DIAGNOSIS — M6281 Muscle weakness (generalized): Secondary | ICD-10-CM | POA: Diagnosis not present

## 2019-06-12 DIAGNOSIS — R41841 Cognitive communication deficit: Secondary | ICD-10-CM | POA: Diagnosis not present

## 2019-06-12 DIAGNOSIS — R2689 Other abnormalities of gait and mobility: Secondary | ICD-10-CM | POA: Diagnosis not present

## 2019-06-13 DIAGNOSIS — R488 Other symbolic dysfunctions: Secondary | ICD-10-CM | POA: Diagnosis not present

## 2019-06-13 DIAGNOSIS — R296 Repeated falls: Secondary | ICD-10-CM | POA: Diagnosis not present

## 2019-06-13 DIAGNOSIS — R2689 Other abnormalities of gait and mobility: Secondary | ICD-10-CM | POA: Diagnosis not present

## 2019-06-13 DIAGNOSIS — R41841 Cognitive communication deficit: Secondary | ICD-10-CM | POA: Diagnosis not present

## 2019-06-13 DIAGNOSIS — M6281 Muscle weakness (generalized): Secondary | ICD-10-CM | POA: Diagnosis not present

## 2019-06-13 DIAGNOSIS — R2681 Unsteadiness on feet: Secondary | ICD-10-CM | POA: Diagnosis not present

## 2019-06-14 DIAGNOSIS — M6281 Muscle weakness (generalized): Secondary | ICD-10-CM | POA: Diagnosis not present

## 2019-06-14 DIAGNOSIS — R296 Repeated falls: Secondary | ICD-10-CM | POA: Diagnosis not present

## 2019-06-14 DIAGNOSIS — R2689 Other abnormalities of gait and mobility: Secondary | ICD-10-CM | POA: Diagnosis not present

## 2019-06-14 DIAGNOSIS — R2681 Unsteadiness on feet: Secondary | ICD-10-CM | POA: Diagnosis not present

## 2019-06-14 DIAGNOSIS — R41841 Cognitive communication deficit: Secondary | ICD-10-CM | POA: Diagnosis not present

## 2019-06-14 DIAGNOSIS — R488 Other symbolic dysfunctions: Secondary | ICD-10-CM | POA: Diagnosis not present

## 2019-06-15 DIAGNOSIS — R488 Other symbolic dysfunctions: Secondary | ICD-10-CM | POA: Diagnosis not present

## 2019-06-15 DIAGNOSIS — M6281 Muscle weakness (generalized): Secondary | ICD-10-CM | POA: Diagnosis not present

## 2019-06-15 DIAGNOSIS — R2681 Unsteadiness on feet: Secondary | ICD-10-CM | POA: Diagnosis not present

## 2019-06-15 DIAGNOSIS — R41841 Cognitive communication deficit: Secondary | ICD-10-CM | POA: Diagnosis not present

## 2019-06-15 DIAGNOSIS — R2689 Other abnormalities of gait and mobility: Secondary | ICD-10-CM | POA: Diagnosis not present

## 2019-06-15 DIAGNOSIS — R296 Repeated falls: Secondary | ICD-10-CM | POA: Diagnosis not present

## 2019-06-18 DIAGNOSIS — M6281 Muscle weakness (generalized): Secondary | ICD-10-CM | POA: Diagnosis not present

## 2019-06-18 DIAGNOSIS — R2689 Other abnormalities of gait and mobility: Secondary | ICD-10-CM | POA: Diagnosis not present

## 2019-06-18 DIAGNOSIS — R41841 Cognitive communication deficit: Secondary | ICD-10-CM | POA: Diagnosis not present

## 2019-06-18 DIAGNOSIS — R296 Repeated falls: Secondary | ICD-10-CM | POA: Diagnosis not present

## 2019-06-18 DIAGNOSIS — R2681 Unsteadiness on feet: Secondary | ICD-10-CM | POA: Diagnosis not present

## 2019-06-18 DIAGNOSIS — R488 Other symbolic dysfunctions: Secondary | ICD-10-CM | POA: Diagnosis not present

## 2019-06-19 DIAGNOSIS — R296 Repeated falls: Secondary | ICD-10-CM | POA: Diagnosis not present

## 2019-06-19 DIAGNOSIS — R488 Other symbolic dysfunctions: Secondary | ICD-10-CM | POA: Diagnosis not present

## 2019-06-19 DIAGNOSIS — M6281 Muscle weakness (generalized): Secondary | ICD-10-CM | POA: Diagnosis not present

## 2019-06-19 DIAGNOSIS — R2681 Unsteadiness on feet: Secondary | ICD-10-CM | POA: Diagnosis not present

## 2019-06-19 DIAGNOSIS — R41841 Cognitive communication deficit: Secondary | ICD-10-CM | POA: Diagnosis not present

## 2019-06-19 DIAGNOSIS — R2689 Other abnormalities of gait and mobility: Secondary | ICD-10-CM | POA: Diagnosis not present

## 2019-06-20 DIAGNOSIS — R296 Repeated falls: Secondary | ICD-10-CM | POA: Diagnosis not present

## 2019-06-20 DIAGNOSIS — R2681 Unsteadiness on feet: Secondary | ICD-10-CM | POA: Diagnosis not present

## 2019-06-20 DIAGNOSIS — R41841 Cognitive communication deficit: Secondary | ICD-10-CM | POA: Diagnosis not present

## 2019-06-20 DIAGNOSIS — R2689 Other abnormalities of gait and mobility: Secondary | ICD-10-CM | POA: Diagnosis not present

## 2019-06-20 DIAGNOSIS — M6281 Muscle weakness (generalized): Secondary | ICD-10-CM | POA: Diagnosis not present

## 2019-06-20 DIAGNOSIS — R488 Other symbolic dysfunctions: Secondary | ICD-10-CM | POA: Diagnosis not present

## 2019-06-21 ENCOUNTER — Ambulatory Visit: Payer: Medicare Other | Admitting: Neurology

## 2019-06-21 DIAGNOSIS — R296 Repeated falls: Secondary | ICD-10-CM | POA: Diagnosis not present

## 2019-06-21 DIAGNOSIS — R488 Other symbolic dysfunctions: Secondary | ICD-10-CM | POA: Diagnosis not present

## 2019-06-21 DIAGNOSIS — M6281 Muscle weakness (generalized): Secondary | ICD-10-CM | POA: Diagnosis not present

## 2019-06-21 DIAGNOSIS — R41841 Cognitive communication deficit: Secondary | ICD-10-CM | POA: Diagnosis not present

## 2019-06-21 DIAGNOSIS — R2689 Other abnormalities of gait and mobility: Secondary | ICD-10-CM | POA: Diagnosis not present

## 2019-06-21 DIAGNOSIS — R2681 Unsteadiness on feet: Secondary | ICD-10-CM | POA: Diagnosis not present

## 2019-06-22 DIAGNOSIS — M6281 Muscle weakness (generalized): Secondary | ICD-10-CM | POA: Diagnosis not present

## 2019-06-22 DIAGNOSIS — R41841 Cognitive communication deficit: Secondary | ICD-10-CM | POA: Diagnosis not present

## 2019-06-22 DIAGNOSIS — R2681 Unsteadiness on feet: Secondary | ICD-10-CM | POA: Diagnosis not present

## 2019-06-22 DIAGNOSIS — R2689 Other abnormalities of gait and mobility: Secondary | ICD-10-CM | POA: Diagnosis not present

## 2019-06-22 DIAGNOSIS — R488 Other symbolic dysfunctions: Secondary | ICD-10-CM | POA: Diagnosis not present

## 2019-06-22 DIAGNOSIS — R296 Repeated falls: Secondary | ICD-10-CM | POA: Diagnosis not present

## 2019-06-23 DIAGNOSIS — R2681 Unsteadiness on feet: Secondary | ICD-10-CM | POA: Diagnosis not present

## 2019-06-23 DIAGNOSIS — R41841 Cognitive communication deficit: Secondary | ICD-10-CM | POA: Diagnosis not present

## 2019-06-23 DIAGNOSIS — M6281 Muscle weakness (generalized): Secondary | ICD-10-CM | POA: Diagnosis not present

## 2019-06-23 DIAGNOSIS — R296 Repeated falls: Secondary | ICD-10-CM | POA: Diagnosis not present

## 2019-06-23 DIAGNOSIS — R488 Other symbolic dysfunctions: Secondary | ICD-10-CM | POA: Diagnosis not present

## 2019-06-23 DIAGNOSIS — R2689 Other abnormalities of gait and mobility: Secondary | ICD-10-CM | POA: Diagnosis not present

## 2019-06-26 DIAGNOSIS — M6281 Muscle weakness (generalized): Secondary | ICD-10-CM | POA: Diagnosis not present

## 2019-06-26 DIAGNOSIS — R41841 Cognitive communication deficit: Secondary | ICD-10-CM | POA: Diagnosis not present

## 2019-06-26 DIAGNOSIS — R2681 Unsteadiness on feet: Secondary | ICD-10-CM | POA: Diagnosis not present

## 2019-06-26 DIAGNOSIS — R296 Repeated falls: Secondary | ICD-10-CM | POA: Diagnosis not present

## 2019-06-26 DIAGNOSIS — R488 Other symbolic dysfunctions: Secondary | ICD-10-CM | POA: Diagnosis not present

## 2019-06-26 DIAGNOSIS — R2689 Other abnormalities of gait and mobility: Secondary | ICD-10-CM | POA: Diagnosis not present

## 2019-06-26 DIAGNOSIS — Z9181 History of falling: Secondary | ICD-10-CM | POA: Diagnosis not present

## 2019-06-27 DIAGNOSIS — M6281 Muscle weakness (generalized): Secondary | ICD-10-CM | POA: Diagnosis not present

## 2019-06-27 DIAGNOSIS — R41841 Cognitive communication deficit: Secondary | ICD-10-CM | POA: Diagnosis not present

## 2019-06-27 DIAGNOSIS — R488 Other symbolic dysfunctions: Secondary | ICD-10-CM | POA: Diagnosis not present

## 2019-06-27 DIAGNOSIS — R2689 Other abnormalities of gait and mobility: Secondary | ICD-10-CM | POA: Diagnosis not present

## 2019-06-27 DIAGNOSIS — R2681 Unsteadiness on feet: Secondary | ICD-10-CM | POA: Diagnosis not present

## 2019-06-27 DIAGNOSIS — R296 Repeated falls: Secondary | ICD-10-CM | POA: Diagnosis not present

## 2019-06-28 DIAGNOSIS — R41841 Cognitive communication deficit: Secondary | ICD-10-CM | POA: Diagnosis not present

## 2019-06-28 DIAGNOSIS — R296 Repeated falls: Secondary | ICD-10-CM | POA: Diagnosis not present

## 2019-06-28 DIAGNOSIS — R2689 Other abnormalities of gait and mobility: Secondary | ICD-10-CM | POA: Diagnosis not present

## 2019-06-28 DIAGNOSIS — R2681 Unsteadiness on feet: Secondary | ICD-10-CM | POA: Diagnosis not present

## 2019-06-28 DIAGNOSIS — R488 Other symbolic dysfunctions: Secondary | ICD-10-CM | POA: Diagnosis not present

## 2019-06-28 DIAGNOSIS — M6281 Muscle weakness (generalized): Secondary | ICD-10-CM | POA: Diagnosis not present

## 2019-06-29 DIAGNOSIS — R2689 Other abnormalities of gait and mobility: Secondary | ICD-10-CM | POA: Diagnosis not present

## 2019-06-29 DIAGNOSIS — R488 Other symbolic dysfunctions: Secondary | ICD-10-CM | POA: Diagnosis not present

## 2019-06-29 DIAGNOSIS — R2681 Unsteadiness on feet: Secondary | ICD-10-CM | POA: Diagnosis not present

## 2019-06-29 DIAGNOSIS — R296 Repeated falls: Secondary | ICD-10-CM | POA: Diagnosis not present

## 2019-06-29 DIAGNOSIS — M6281 Muscle weakness (generalized): Secondary | ICD-10-CM | POA: Diagnosis not present

## 2019-06-29 DIAGNOSIS — R41841 Cognitive communication deficit: Secondary | ICD-10-CM | POA: Diagnosis not present

## 2019-06-30 DIAGNOSIS — R2681 Unsteadiness on feet: Secondary | ICD-10-CM | POA: Diagnosis not present

## 2019-06-30 DIAGNOSIS — Z9181 History of falling: Secondary | ICD-10-CM | POA: Diagnosis not present

## 2019-06-30 DIAGNOSIS — M6281 Muscle weakness (generalized): Secondary | ICD-10-CM | POA: Diagnosis not present

## 2019-07-04 DIAGNOSIS — M6281 Muscle weakness (generalized): Secondary | ICD-10-CM | POA: Diagnosis not present

## 2019-07-04 DIAGNOSIS — Z9181 History of falling: Secondary | ICD-10-CM | POA: Diagnosis not present

## 2019-07-04 DIAGNOSIS — R2681 Unsteadiness on feet: Secondary | ICD-10-CM | POA: Diagnosis not present

## 2019-07-05 DIAGNOSIS — R2681 Unsteadiness on feet: Secondary | ICD-10-CM | POA: Diagnosis not present

## 2019-07-05 DIAGNOSIS — M6281 Muscle weakness (generalized): Secondary | ICD-10-CM | POA: Diagnosis not present

## 2019-07-05 DIAGNOSIS — Z9181 History of falling: Secondary | ICD-10-CM | POA: Diagnosis not present

## 2019-07-06 DIAGNOSIS — R2681 Unsteadiness on feet: Secondary | ICD-10-CM | POA: Diagnosis not present

## 2019-07-06 DIAGNOSIS — M6281 Muscle weakness (generalized): Secondary | ICD-10-CM | POA: Diagnosis not present

## 2019-07-06 DIAGNOSIS — Z9181 History of falling: Secondary | ICD-10-CM | POA: Diagnosis not present

## 2019-07-10 DIAGNOSIS — M6281 Muscle weakness (generalized): Secondary | ICD-10-CM | POA: Diagnosis not present

## 2019-07-10 DIAGNOSIS — Z9181 History of falling: Secondary | ICD-10-CM | POA: Diagnosis not present

## 2019-07-10 DIAGNOSIS — R2681 Unsteadiness on feet: Secondary | ICD-10-CM | POA: Diagnosis not present

## 2019-07-11 DIAGNOSIS — R2681 Unsteadiness on feet: Secondary | ICD-10-CM | POA: Diagnosis not present

## 2019-07-11 DIAGNOSIS — Z9181 History of falling: Secondary | ICD-10-CM | POA: Diagnosis not present

## 2019-07-11 DIAGNOSIS — M6281 Muscle weakness (generalized): Secondary | ICD-10-CM | POA: Diagnosis not present

## 2019-07-13 DIAGNOSIS — M6281 Muscle weakness (generalized): Secondary | ICD-10-CM | POA: Diagnosis not present

## 2019-07-13 DIAGNOSIS — Z9181 History of falling: Secondary | ICD-10-CM | POA: Diagnosis not present

## 2019-07-13 DIAGNOSIS — R2681 Unsteadiness on feet: Secondary | ICD-10-CM | POA: Diagnosis not present

## 2019-07-14 DIAGNOSIS — G2581 Restless legs syndrome: Secondary | ICD-10-CM | POA: Diagnosis not present

## 2019-07-14 DIAGNOSIS — F411 Generalized anxiety disorder: Secondary | ICD-10-CM | POA: Diagnosis not present

## 2019-07-28 DIAGNOSIS — F411 Generalized anxiety disorder: Secondary | ICD-10-CM | POA: Diagnosis not present

## 2019-07-28 DIAGNOSIS — G2581 Restless legs syndrome: Secondary | ICD-10-CM | POA: Diagnosis not present

## 2019-07-28 DIAGNOSIS — H9193 Unspecified hearing loss, bilateral: Secondary | ICD-10-CM | POA: Diagnosis not present

## 2019-10-13 DIAGNOSIS — H04123 Dry eye syndrome of bilateral lacrimal glands: Secondary | ICD-10-CM | POA: Diagnosis not present

## 2019-10-13 DIAGNOSIS — G2581 Restless legs syndrome: Secondary | ICD-10-CM | POA: Diagnosis not present

## 2019-10-13 DIAGNOSIS — F411 Generalized anxiety disorder: Secondary | ICD-10-CM | POA: Diagnosis not present

## 2019-11-03 DIAGNOSIS — L299 Pruritus, unspecified: Secondary | ICD-10-CM | POA: Diagnosis not present

## 2019-11-03 DIAGNOSIS — F411 Generalized anxiety disorder: Secondary | ICD-10-CM | POA: Diagnosis not present

## 2019-11-03 DIAGNOSIS — H04123 Dry eye syndrome of bilateral lacrimal glands: Secondary | ICD-10-CM | POA: Diagnosis not present

## 2019-11-03 DIAGNOSIS — G2581 Restless legs syndrome: Secondary | ICD-10-CM | POA: Diagnosis not present

## 2020-04-04 DIAGNOSIS — H26491 Other secondary cataract, right eye: Secondary | ICD-10-CM | POA: Diagnosis not present

## 2020-06-07 DIAGNOSIS — Z79899 Other long term (current) drug therapy: Secondary | ICD-10-CM | POA: Diagnosis not present

## 2020-06-07 DIAGNOSIS — M6530 Trigger finger, unspecified finger: Secondary | ICD-10-CM | POA: Diagnosis not present

## 2020-06-07 DIAGNOSIS — R609 Edema, unspecified: Secondary | ICD-10-CM | POA: Diagnosis not present

## 2020-06-20 DIAGNOSIS — I639 Cerebral infarction, unspecified: Secondary | ICD-10-CM | POA: Diagnosis not present

## 2020-06-20 DIAGNOSIS — M810 Age-related osteoporosis without current pathological fracture: Secondary | ICD-10-CM | POA: Diagnosis not present

## 2020-06-20 DIAGNOSIS — H9193 Unspecified hearing loss, bilateral: Secondary | ICD-10-CM | POA: Diagnosis not present

## 2020-06-20 DIAGNOSIS — I10 Essential (primary) hypertension: Secondary | ICD-10-CM | POA: Diagnosis not present

## 2020-06-20 DIAGNOSIS — F840 Autistic disorder: Secondary | ICD-10-CM | POA: Diagnosis not present

## 2020-06-20 DIAGNOSIS — M6530 Trigger finger, unspecified finger: Secondary | ICD-10-CM | POA: Diagnosis not present

## 2020-06-20 DIAGNOSIS — E039 Hypothyroidism, unspecified: Secondary | ICD-10-CM | POA: Diagnosis not present

## 2020-06-20 DIAGNOSIS — N1831 Chronic kidney disease, stage 3a: Secondary | ICD-10-CM | POA: Diagnosis not present

## 2020-06-20 DIAGNOSIS — G40909 Epilepsy, unspecified, not intractable, without status epilepticus: Secondary | ICD-10-CM | POA: Diagnosis not present

## 2020-06-20 DIAGNOSIS — E785 Hyperlipidemia, unspecified: Secondary | ICD-10-CM | POA: Diagnosis not present

## 2020-06-20 DIAGNOSIS — G2581 Restless legs syndrome: Secondary | ICD-10-CM | POA: Diagnosis not present

## 2020-06-20 DIAGNOSIS — E1159 Type 2 diabetes mellitus with other circulatory complications: Secondary | ICD-10-CM | POA: Diagnosis not present

## 2020-06-24 ENCOUNTER — Other Ambulatory Visit: Payer: Self-pay | Admitting: Family Medicine

## 2020-06-24 DIAGNOSIS — Z1231 Encounter for screening mammogram for malignant neoplasm of breast: Secondary | ICD-10-CM

## 2020-06-24 DIAGNOSIS — M810 Age-related osteoporosis without current pathological fracture: Secondary | ICD-10-CM

## 2020-06-25 IMAGING — MR MR HEAD W/O CM
11 of 12 series · 43 of 48 positions shown · non-contrast
Comparison: Head CT from yesterday.  Brain MRI 09/14/2011

CLINICAL DATA: Altered level of consciousness

EXAM:
MRI HEAD WITHOUT CONTRAST
TECHNIQUE: Multiplanar, multiecho pulse sequences of the brain and surrounding
structures were obtained without intravenous contrast.

[Series 5: DWI · axial · 4.0mm · 0.92mm/px · z∈[-104,+26]mm · 6 of 70 slices shown (1 of 4)]
[im 1/70]
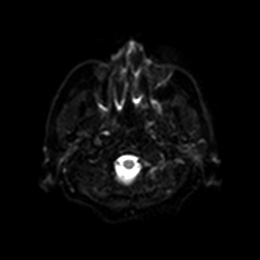
[im 14/70]
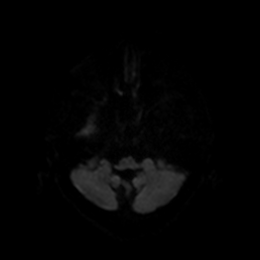
[im 28/70]
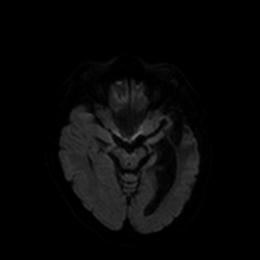
[im 42/70]
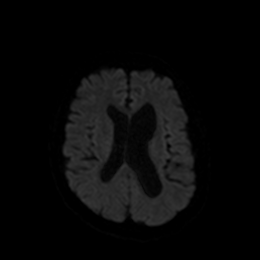
[im 56/70]
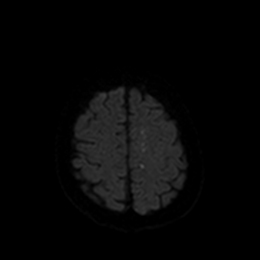
[im 70/70]
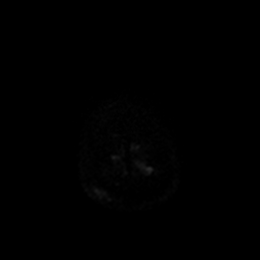

[Series 6: DWI · axial · 4.0mm · 0.92mm/px · z∈[-104,+26]mm · 4 of 35 slices shown (2 of 4)]
[im 1/35]
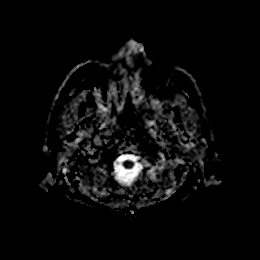
[im 12/35]
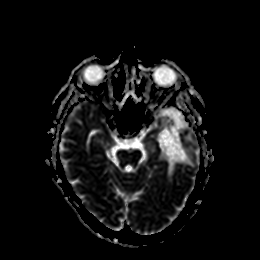
[im 23/35]
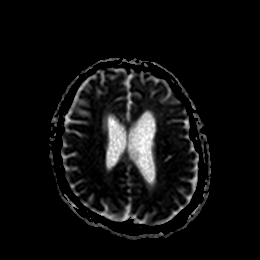
[im 35/35]
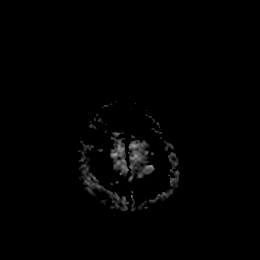

[Series 7: DWI · coronal · 4.0mm · 0.88mm/px · 6 of 60 slices shown (3 of 4)]
[im 1/60]
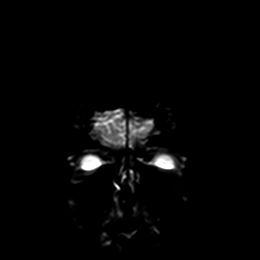
[im 12/60]
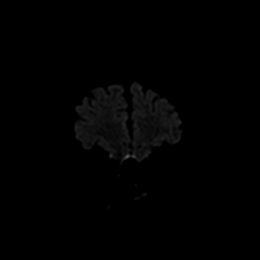
[im 24/60]
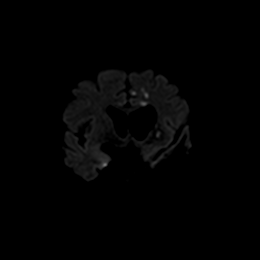
[im 36/60]
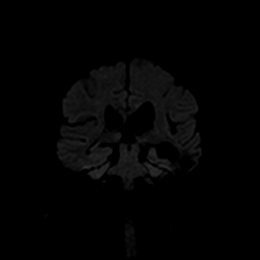
[im 48/60]
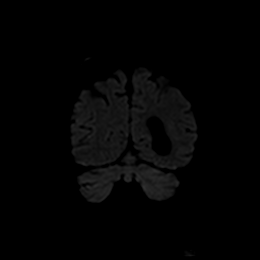
[im 60/60]
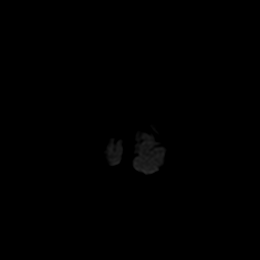

[Series 8: DWI · coronal · 4.0mm · 0.88mm/px · 3 of 30 slices shown (4 of 4)]
[im 1/30]
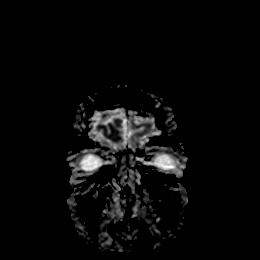
[im 15/30]
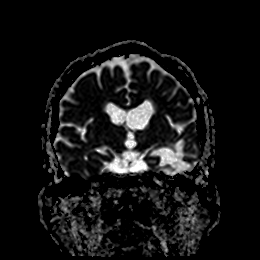
[im 30/30]
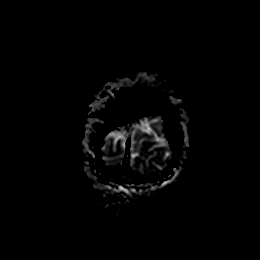

[Series 9: T1 · sagittal · 5.0mm · 0.75mm/px · 2 of 23 slices shown]
[im 1/23]
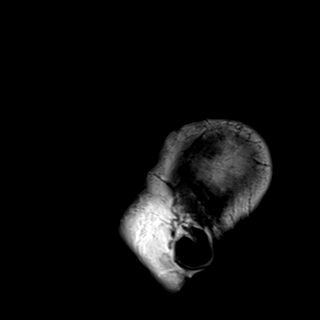
[im 23/23]
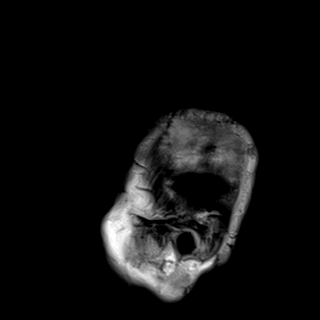

[Series 10: T2 · axial · 5.0mm · 0.75mm/px · z∈[-112,+26]mm · 3 of 25 slices shown (1 of 2)]
[im 1/25]
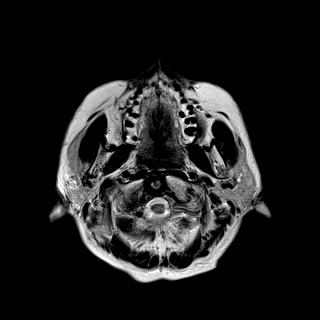
[im 13/25]
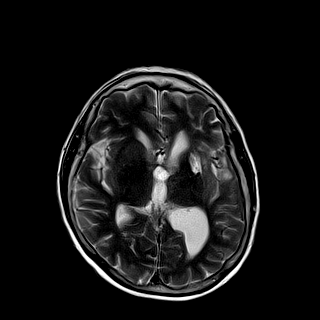
[im 25/25]
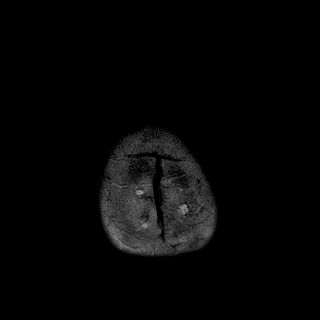

[Series 11: FLAIR · axial · 5.0mm · 0.47mm/px · z∈[-113,+25]mm · 3 of 25 slices shown (1 of 2)]
[im 1/25]
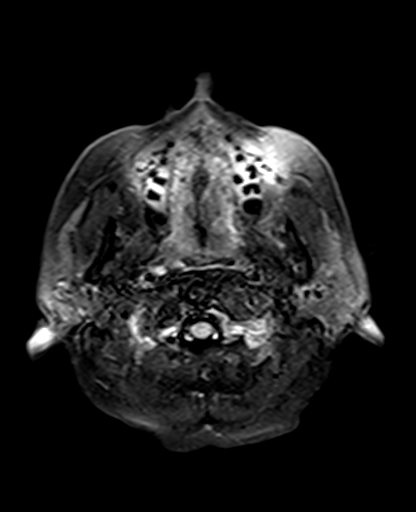
[im 13/25]
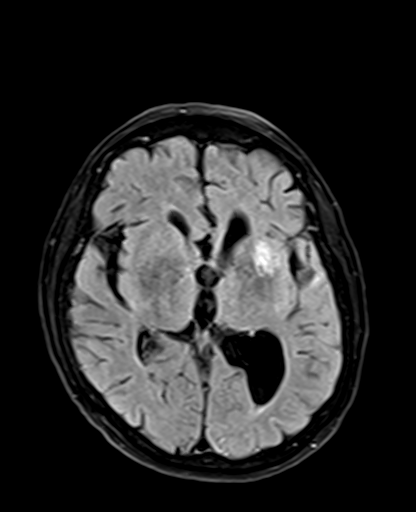
[im 25/25]
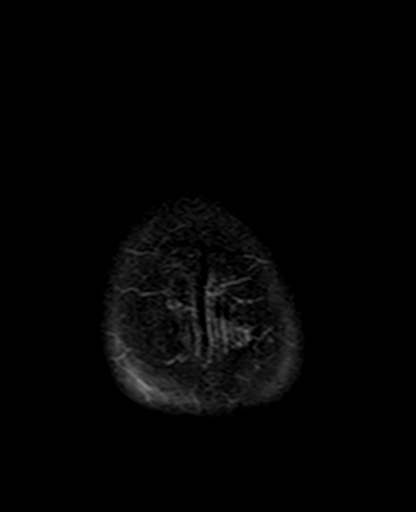

[Series 12: swi_images · axial · 3.0mm · 0.94mm/px · z∈[-123,+24]mm · 5 of 52 slices shown]
[im 1/52]
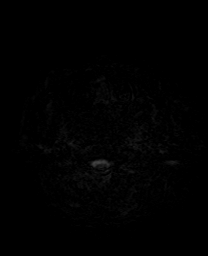
[im 13/52]
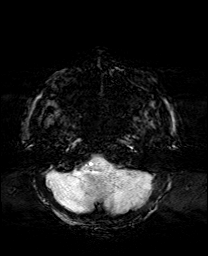
[im 26/52]
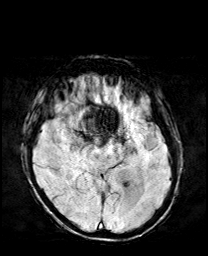
[im 39/52]
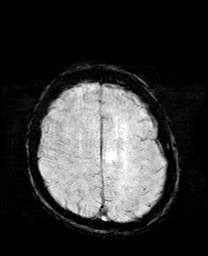
[im 52/52]
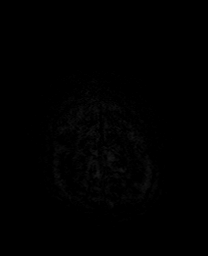

[Series 13: mip_images(sw) · axial · 24.0mm · 0.94mm/px · z∈[-113,+14]mm · 5 of 45 slices shown]
[im 1/45]
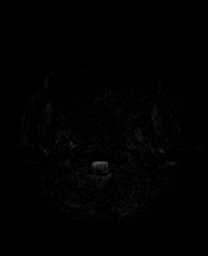
[im 12/45]
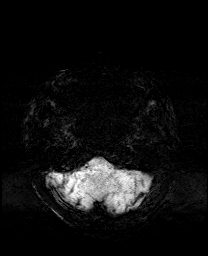
[im 23/45]
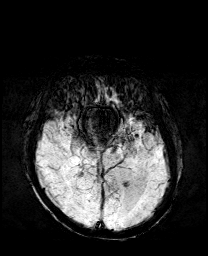
[im 34/45]
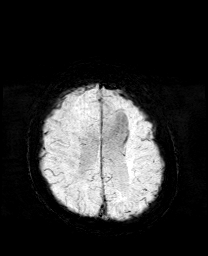
[im 45/45]
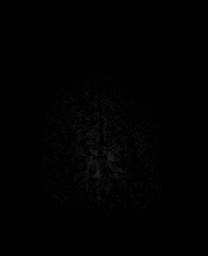

[Series 15: T2 · axial · 3.0mm · 0.53mm/px · z∈[+1,+63]mm · 3 of 27 slices shown (2 of 2)]
[im 1/27]
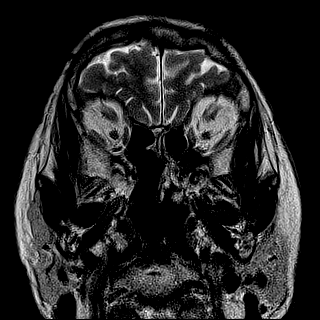
[im 14/27]
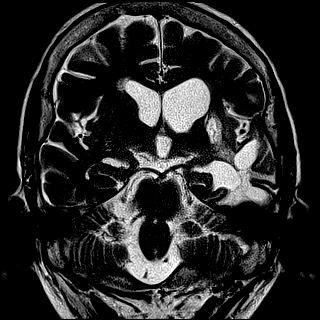
[im 27/27]
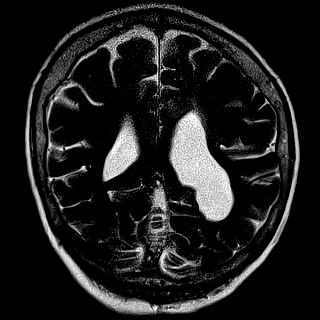

[Series 16: FLAIR · axial · 3.0mm · 0.66mm/px · z∈[+1,+63]mm · 3 of 27 slices shown (2 of 2)]
[im 1/27]
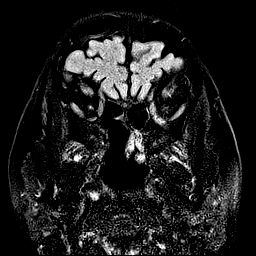
[im 14/27]
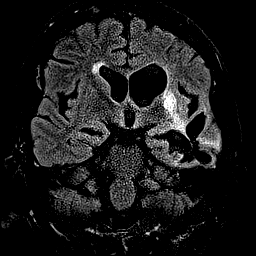
[im 27/27]
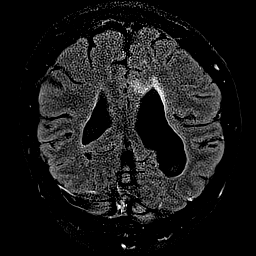

[43 of 48 positions shown; findings below may reference images not displayed]

FINDINGS: Brain: Cluster of small foci of restricted diffusion in the
parasagittal left frontal lobe extending to the corpus callosum.

Diffusion alteration at a remote left lateral lenticulostriate
infarct is attributed to mineralization. Encephalomalacia in the
left temporal lobe where there was parenchymal hemorrhage in [id number].

Ventriculomegaly from volume loss, worse on left. No acute
hemorrhage. No hydrocephalus or masslike finding.

Vascular: Major flow voids are preserved

Skull and upper cervical spine: No evidence of marrow lesion

Sinuses/Orbits: Status post endoscopic sinus surgery. Bilateral
cataract resection.
IMPRESSION: 1. Cluster of small acute infarcts in the distal left ACA territory.
2. Remote left temporal lobe hemorrhage and left lateral
lenticulostriate infarct.

## 2020-06-28 IMAGING — MR MR HEAD W/O CM
4 series · 48 of 48 positions shown · non-contrast
Comparison: Head CT 07/16/2018 and MRI 07/15/2018

CLINICAL DATA: Recurrent right-sided weakness.

EXAM:
MRI HEAD WITHOUT CONTRAST
TECHNIQUE: Multiplanar, multiecho pulse sequences of the brain and surrounding
structures were obtained without intravenous contrast.

[Series 5: DWI · axial · 4.0mm · 0.88mm/px · z∈[-59,+76]mm · 17 of 70 slices shown (1 of 4)]
[im 1/70]
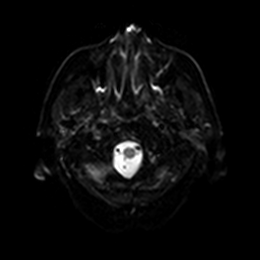
[im 5/70]
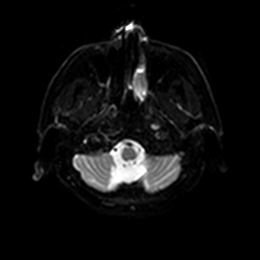
[im 9/70]
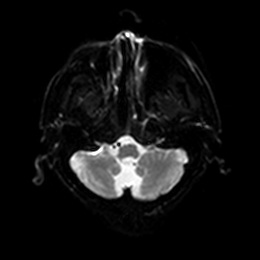
[im 13/70]
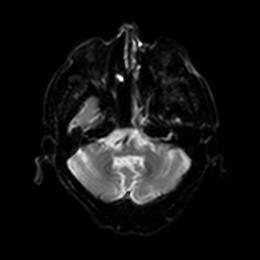
[im 18/70]
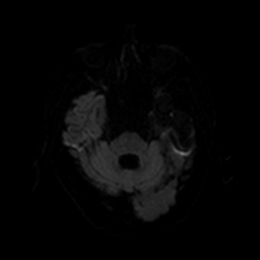
[im 22/70]
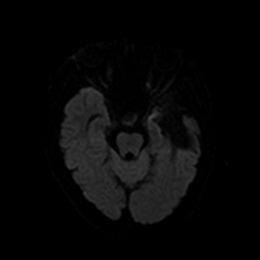
[im 26/70]
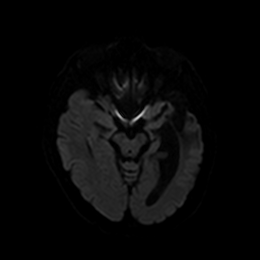
[im 31/70]
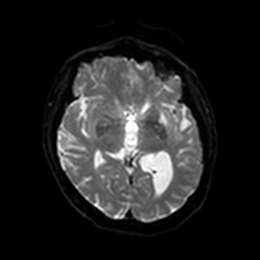
[im 35/70]
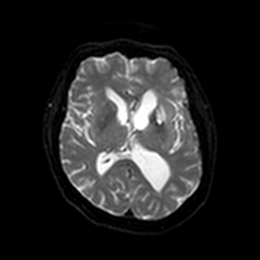
[im 39/70]
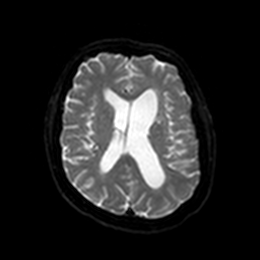
[im 44/70]
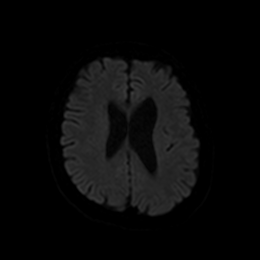
[im 48/70]
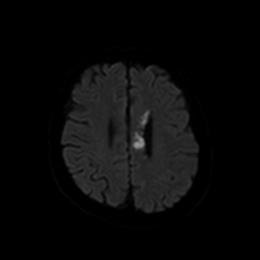
[im 52/70]
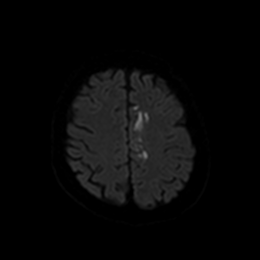
[im 57/70]
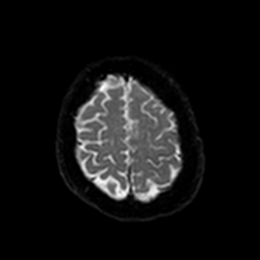
[im 61/70]
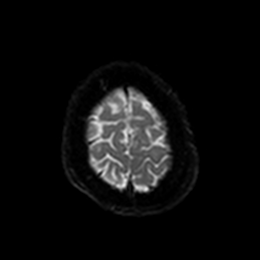
[im 65/70]
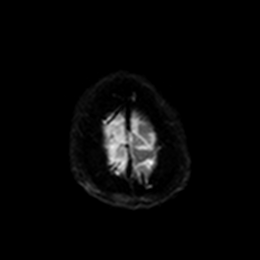
[im 70/70]
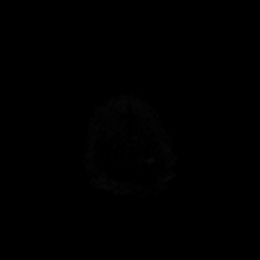

[Series 6: DWI · axial · 4.0mm · 0.88mm/px · z∈[-59,+76]mm · 8 of 35 slices shown (2 of 4)]
[im 1/35]
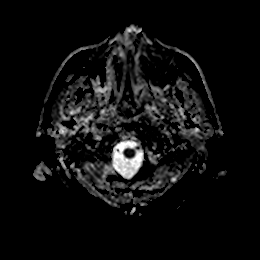
[im 5/35]
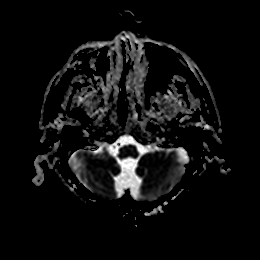
[im 10/35]
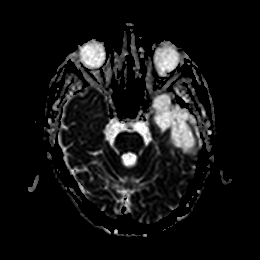
[im 15/35]
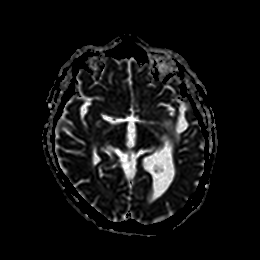
[im 20/35]
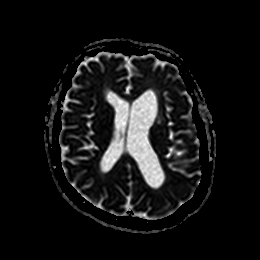
[im 25/35]
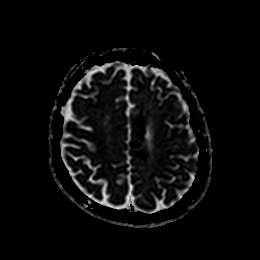
[im 30/35]
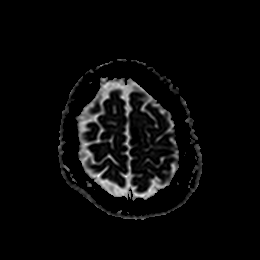
[im 35/35]
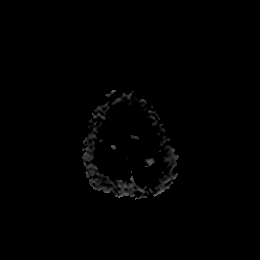

[Series 7: DWI · coronal · 4.0mm · 0.88mm/px · 15 of 64 slices shown (3 of 4)]
[im 1/64]
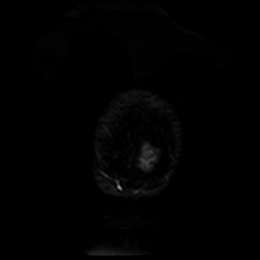
[im 5/64]
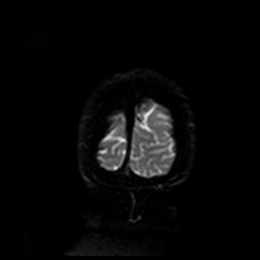
[im 10/64]
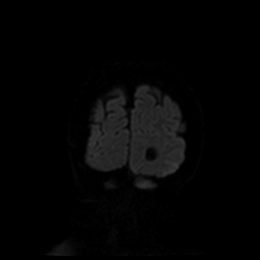
[im 14/64]
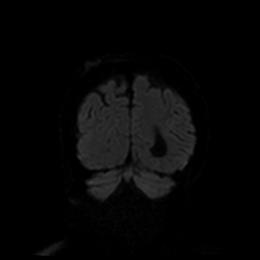
[im 19/64]
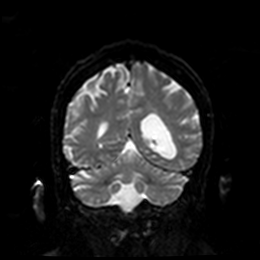
[im 23/64]
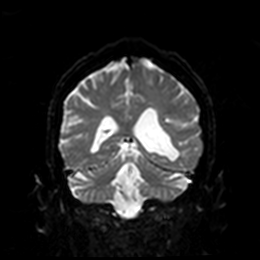
[im 28/64]
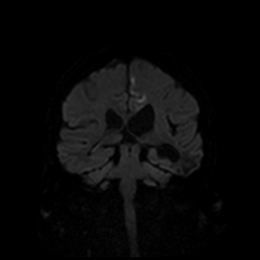
[im 32/64]
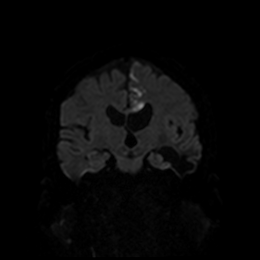
[im 37/64]
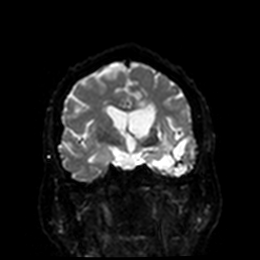
[im 41/64]
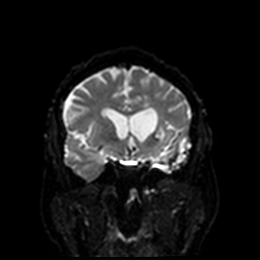
[im 46/64]
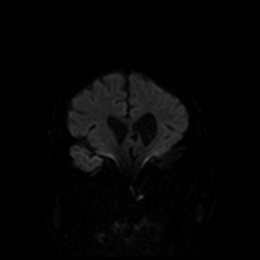
[im 50/64]
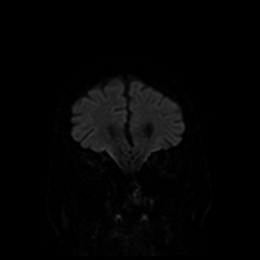
[im 55/64]
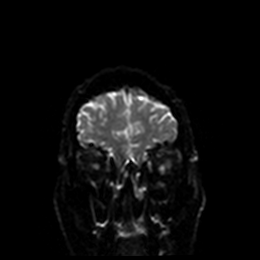
[im 59/64]
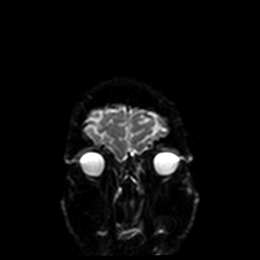
[im 64/64]
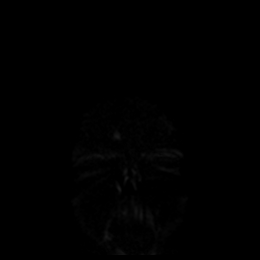

[Series 8: DWI · coronal · 4.0mm · 0.88mm/px · 8 of 32 slices shown (4 of 4)]
[im 1/32]
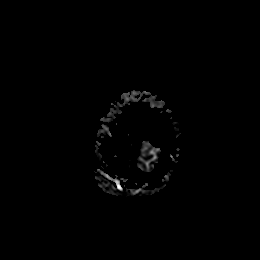
[im 5/32]
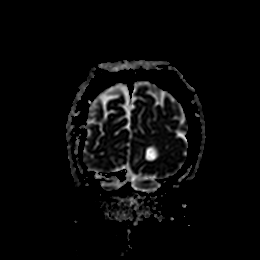
[im 9/32]
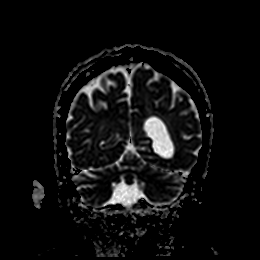
[im 14/32]
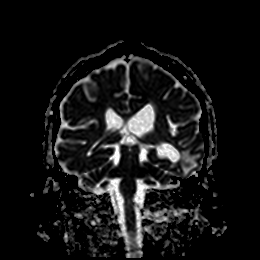
[im 18/32]
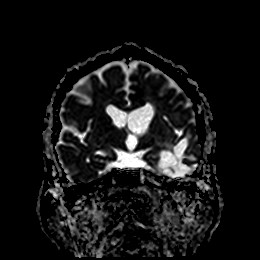
[im 23/32]
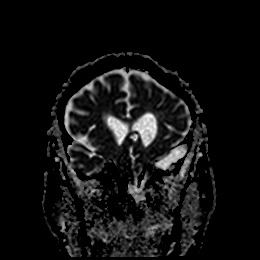
[im 27/32]
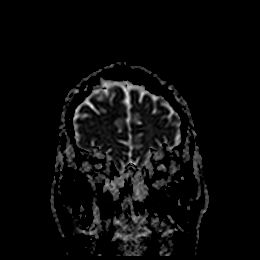
[im 32/32]
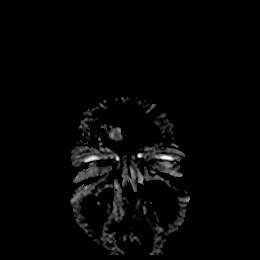

[48 of 48 positions shown; findings below may reference images not displayed]

FINDINGS: At the request of the attending neurologist, only axial and coronal
diffusion imaging was performed. Multiple clustered acute infarcts
are again seen in the parasagittal left frontal lobe extending into
the corpus callosum. These have increased from the prior MRI, mainly
superiorly and posteriorly. Encephalomalacia in the left temporal
lobe and left basal ganglia was more fully evaluated on the prior
complete brain MRI.
IMPRESSION: Increased number of acute distal left ACA infarcts since the
07/15/2018 MRI.

## 2020-07-11 DIAGNOSIS — H02002 Unspecified entropion of right lower eyelid: Secondary | ICD-10-CM | POA: Diagnosis not present

## 2020-08-15 DIAGNOSIS — H02834 Dermatochalasis of left upper eyelid: Secondary | ICD-10-CM | POA: Diagnosis not present

## 2020-08-15 DIAGNOSIS — H02423 Myogenic ptosis of bilateral eyelids: Secondary | ICD-10-CM | POA: Diagnosis not present

## 2020-08-15 DIAGNOSIS — H02413 Mechanical ptosis of bilateral eyelids: Secondary | ICD-10-CM | POA: Diagnosis not present

## 2020-08-15 DIAGNOSIS — H57813 Brow ptosis, bilateral: Secondary | ICD-10-CM | POA: Diagnosis not present

## 2020-08-15 DIAGNOSIS — H02042 Spastic entropion of right lower eyelid: Secondary | ICD-10-CM | POA: Diagnosis not present

## 2020-08-15 DIAGNOSIS — H02831 Dermatochalasis of right upper eyelid: Secondary | ICD-10-CM | POA: Diagnosis not present

## 2020-09-17 ENCOUNTER — Telehealth: Payer: Self-pay | Admitting: Neurology

## 2020-09-17 NOTE — Telephone Encounter (Signed)
Kaitlyn from [employer] called to follow up on a fax that was sent in for Pre-op Clearance related to patient taking Plavix. Her surgery is on 09/24/20.

## 2020-09-18 NOTE — Telephone Encounter (Signed)
Done, thanks

## 2020-09-18 NOTE — Telephone Encounter (Signed)
Fax sent back to oculofacial plastic surgery

## 2020-09-24 DIAGNOSIS — H57813 Brow ptosis, bilateral: Secondary | ICD-10-CM | POA: Diagnosis not present

## 2020-09-24 DIAGNOSIS — H02413 Mechanical ptosis of bilateral eyelids: Secondary | ICD-10-CM | POA: Diagnosis not present

## 2020-09-24 DIAGNOSIS — H02042 Spastic entropion of right lower eyelid: Secondary | ICD-10-CM | POA: Diagnosis not present

## 2020-09-24 DIAGNOSIS — H02834 Dermatochalasis of left upper eyelid: Secondary | ICD-10-CM | POA: Diagnosis not present

## 2020-09-24 DIAGNOSIS — H02032 Senile entropion of right lower eyelid: Secondary | ICD-10-CM | POA: Diagnosis not present

## 2020-09-24 DIAGNOSIS — H02532 Eyelid retraction right lower eyelid: Secondary | ICD-10-CM | POA: Diagnosis not present

## 2020-09-24 DIAGNOSIS — H02423 Myogenic ptosis of bilateral eyelids: Secondary | ICD-10-CM | POA: Diagnosis not present

## 2020-09-24 DIAGNOSIS — H02831 Dermatochalasis of right upper eyelid: Secondary | ICD-10-CM | POA: Diagnosis not present

## 2020-09-27 ENCOUNTER — Ambulatory Visit: Payer: Medicare Other

## 2020-09-27 ENCOUNTER — Other Ambulatory Visit: Payer: Medicare Other

## 2020-10-10 DIAGNOSIS — Z23 Encounter for immunization: Secondary | ICD-10-CM | POA: Diagnosis not present

## 2020-11-01 DIAGNOSIS — H9193 Unspecified hearing loss, bilateral: Secondary | ICD-10-CM | POA: Diagnosis not present

## 2020-11-01 DIAGNOSIS — G2581 Restless legs syndrome: Secondary | ICD-10-CM | POA: Diagnosis not present

## 2020-11-01 DIAGNOSIS — H1031 Unspecified acute conjunctivitis, right eye: Secondary | ICD-10-CM | POA: Diagnosis not present

## 2020-11-12 ENCOUNTER — Encounter: Payer: Self-pay | Admitting: Neurology

## 2020-11-12 ENCOUNTER — Ambulatory Visit: Payer: Medicare Other | Admitting: Neurology

## 2020-11-12 DIAGNOSIS — Z029 Encounter for administrative examinations, unspecified: Secondary | ICD-10-CM

## 2020-11-29 DIAGNOSIS — H9193 Unspecified hearing loss, bilateral: Secondary | ICD-10-CM | POA: Diagnosis not present

## 2020-11-29 DIAGNOSIS — G2581 Restless legs syndrome: Secondary | ICD-10-CM | POA: Diagnosis not present

## 2020-12-12 DIAGNOSIS — Z23 Encounter for immunization: Secondary | ICD-10-CM | POA: Diagnosis not present

## 2020-12-20 DIAGNOSIS — H9193 Unspecified hearing loss, bilateral: Secondary | ICD-10-CM | POA: Diagnosis not present

## 2020-12-20 DIAGNOSIS — G2581 Restless legs syndrome: Secondary | ICD-10-CM | POA: Diagnosis not present

## 2021-01-03 DIAGNOSIS — E039 Hypothyroidism, unspecified: Secondary | ICD-10-CM | POA: Diagnosis not present

## 2021-01-03 DIAGNOSIS — F840 Autistic disorder: Secondary | ICD-10-CM | POA: Diagnosis not present

## 2021-01-03 DIAGNOSIS — G2581 Restless legs syndrome: Secondary | ICD-10-CM | POA: Diagnosis not present

## 2021-01-03 DIAGNOSIS — E1159 Type 2 diabetes mellitus with other circulatory complications: Secondary | ICD-10-CM | POA: Diagnosis not present

## 2021-01-03 DIAGNOSIS — H9193 Unspecified hearing loss, bilateral: Secondary | ICD-10-CM | POA: Diagnosis not present

## 2021-01-03 DIAGNOSIS — I10 Essential (primary) hypertension: Secondary | ICD-10-CM | POA: Diagnosis not present

## 2021-01-03 DIAGNOSIS — N1831 Chronic kidney disease, stage 3a: Secondary | ICD-10-CM | POA: Diagnosis not present

## 2021-01-06 DIAGNOSIS — H04123 Dry eye syndrome of bilateral lacrimal glands: Secondary | ICD-10-CM | POA: Diagnosis not present

## 2021-01-06 DIAGNOSIS — H02135 Senile ectropion of left lower eyelid: Secondary | ICD-10-CM | POA: Diagnosis not present

## 2021-01-06 DIAGNOSIS — H0012 Chalazion right lower eyelid: Secondary | ICD-10-CM | POA: Diagnosis not present

## 2021-01-06 DIAGNOSIS — H02532 Eyelid retraction right lower eyelid: Secondary | ICD-10-CM | POA: Diagnosis not present

## 2021-01-06 DIAGNOSIS — H02132 Senile ectropion of right lower eyelid: Secondary | ICD-10-CM | POA: Diagnosis not present

## 2021-01-09 ENCOUNTER — Other Ambulatory Visit: Payer: Medicare Other

## 2021-01-09 ENCOUNTER — Inpatient Hospital Stay: Admission: RE | Admit: 2021-01-09 | Payer: Medicare Other | Source: Ambulatory Visit

## 2021-01-24 DIAGNOSIS — G2581 Restless legs syndrome: Secondary | ICD-10-CM | POA: Diagnosis not present

## 2021-01-24 DIAGNOSIS — H0289 Other specified disorders of eyelid: Secondary | ICD-10-CM | POA: Diagnosis not present

## 2021-02-07 DIAGNOSIS — G2581 Restless legs syndrome: Secondary | ICD-10-CM | POA: Diagnosis not present

## 2021-02-24 DIAGNOSIS — H02532 Eyelid retraction right lower eyelid: Secondary | ICD-10-CM | POA: Diagnosis not present

## 2021-02-24 DIAGNOSIS — H02132 Senile ectropion of right lower eyelid: Secondary | ICD-10-CM | POA: Diagnosis not present

## 2021-02-24 DIAGNOSIS — H0012 Chalazion right lower eyelid: Secondary | ICD-10-CM | POA: Diagnosis not present

## 2021-02-24 DIAGNOSIS — H02112 Cicatricial ectropion of right lower eyelid: Secondary | ICD-10-CM | POA: Diagnosis not present

## 2021-02-24 DIAGNOSIS — H02122 Mechanical ectropion of right lower eyelid: Secondary | ICD-10-CM | POA: Diagnosis not present

## 2021-02-24 DIAGNOSIS — H02135 Senile ectropion of left lower eyelid: Secondary | ICD-10-CM | POA: Diagnosis not present

## 2021-02-28 DIAGNOSIS — R0989 Other specified symptoms and signs involving the circulatory and respiratory systems: Secondary | ICD-10-CM | POA: Diagnosis not present

## 2021-03-21 DIAGNOSIS — R0981 Nasal congestion: Secondary | ICD-10-CM | POA: Diagnosis not present

## 2021-03-28 DIAGNOSIS — R300 Dysuria: Secondary | ICD-10-CM | POA: Diagnosis not present

## 2021-04-07 ENCOUNTER — Encounter: Payer: Self-pay | Admitting: Neurology

## 2021-04-07 ENCOUNTER — Ambulatory Visit (INDEPENDENT_AMBULATORY_CARE_PROVIDER_SITE_OTHER): Payer: Medicare Other | Admitting: Neurology

## 2021-04-07 ENCOUNTER — Other Ambulatory Visit: Payer: Self-pay

## 2021-04-07 ENCOUNTER — Other Ambulatory Visit (INDEPENDENT_AMBULATORY_CARE_PROVIDER_SITE_OTHER): Payer: Medicare Other

## 2021-04-07 VITALS — BP 149/89 | HR 97 | Ht 60.5 in | Wt 136.0 lb

## 2021-04-07 DIAGNOSIS — Z8673 Personal history of transient ischemic attack (TIA), and cerebral infarction without residual deficits: Secondary | ICD-10-CM

## 2021-04-07 DIAGNOSIS — G2581 Restless legs syndrome: Secondary | ICD-10-CM

## 2021-04-07 MED ORDER — GABAPENTIN 300 MG PO CAPS: 300.0000 mg | ORAL_CAPSULE | Freq: Every day | ORAL | 11 refills | Status: DC

## 2021-04-07 NOTE — Patient Instructions (Addendum)
1. Bloodwork for iron studies  2. Start Gabapentin 300mg : take 1 capsule every night. If this causes too much drowsiness, please call our office and we can do an even lower dose of 100mg  every night  3. Continue all your other medications  4. Follow-up in 6 months, call for any changes

## 2021-04-07 NOTE — Progress Notes (Signed)
NEUROLOGY FOLLOW UP OFFICE NOTE  Laura [name] [id number] [birth year]  HISTORY OF PRESENT ILLNESS: I had the pleasure of seeing Laura [name] in follow-up in the neurology clinic on 04/07/2021. She is accompanied by her medical POA Laura [name] who helps with sign language interpretation. The patient was last seen in October 2019. She was initially seen for cognitive changes after left subdural hematoma in 2018. She had a left ACA stroke in 06/2018. She has been on Levetiracetam since the SDH in 2018, staring episodes were reported in 2019. She presents today for RLS/ongoing leg issues. She lives at [facility]. On her PCP visit in January 2022, she reported her legs are nervous [name] of the time. Clonazepam 1/2 tab daily does not help. She is also on Ropinirole 2mg  at night. has known Laura [name] for over 20 years and notes that there has always been an anxiety component to her symptoms, but does feel that she has RLS symptoms. The patient reports that she needs her legs to be quiet, sometimes she needs to walk and feels a little better. She says her legs are "very nervous" and still shake. Laura [name] says she has had nerve problems and anxiety for the past 16 years. Laura [name] states she picks at herself a lot. Laura [name] notes that her legs do jerks sometimes, but feels the vibration feeling is more anxiety-related. She has tried several medications with Dr. Darl [name] in the past but would throw them out when she does not notice much change, Laura [name] reports clonazepam has helped the most. She reports she is otherwise doing fine and fixated on her eye problems post-surgery. She denies any headaches. She has a little dizziness that appears balance-related. No falls in the past year. Laura [name] reports cognition has improved since the stroke, she still notes that she forgets how to spell some words and would forget what she was saying in the middle of a sentence, "aging stuff." She is independent with dressing and bathing, staff  checks on her every night. There have been no seizures or seizure-like symptoms. No focal weakness, Laura [name] has noticed it is a little harder to get out of the car.   History on Initial Assessment 06/03/2017: This is a pleasant 80 year old woman with a history of hypertension, hyperlipidemia, autism spectrum disorder, deaf, presenting after hospitalization from May 15-22, 2018. Records were reviewed. She lives independently at [facility] and was last known normal the evening before admission. She had not been seen the next morning, and staff found her around 11am on a chair in her room, confused and covered in emesis. She was brought to [facility] where head CT showed a left temporal lobe hematoma with surrounding intraparenchymal hemorrhages, acute left holohemispheric subdural hematoma with acute falcotentorial subdural hematoma with left to right shift. There was a small right parietal scalp hematoma. No skull fracture. Bleed was felt to be traumatic in etiology. She was monitored clinically, no surgical intervention done. She started to become less confused and was discharged to rehab. She had some encephalopathy when first returning to [facility], she has been under 24/7 care and initially needed help with bathing, but has shown improvement, she can now dress and bathe independently. She reports she is doing fine, "I'm happy." She denies any headaches, dizziness, diplopia, dysphagia, neck/back pain, focal numbness/tingling/weakness, bowel/bladder dysfunction. She has a history of an internal sensation of shaking in her legs, she states she takes medicine that helps. She had previously been ambulating independently but had balance issues,  but since the bleed, balance has been worse and she has been using the walker. She would sometimes forget to use her walker and leave it in the room. Her HCPOA reports that staff has been trying to stimulate her and get her back to a routine. She functions on the autism  spectrum where she is very rigid and rule-driven, she has to take her medications and meals at specific times. They are trying to get her back to a set routine so she can stay independent, they have not trained her to put on Depends at night . Her HCPOA reports she still has a lot of deficits, she has memory loss where things she used to remember off the top of her head are a struggle, such as names or an event. She would say "Laura [name] did this with me," but she actually did not. She used to finger spell quickly, now this is a struggle. Her reasoning skills have always been limited, but it is worse. Vision problems seem to have worsened as well. She is talking to herself a lot now, she has a video phone she uses to communicate with other hearing impaired persons, now she would pick up the phone and watch the video of herself, signing to herself as if communicating with another person, telling a whole story, then turning to Laura [name] and saying she is done. They watched some of them on video, she would get lost in part of the story or forget names and stumble. Sometimes she does this in front of a mirror. When she first came home, she would stare off then come back, they are seeing less of this now, probably a couple of times a week. No convulsive activity or body jerks noted. She was started on Keppra in the hospital for seizure prophylaxis, she has not seen Neurosurgery since discharge and continues on medication with no significant side effects. She states she takes her medications herself twice a day, she is pretty good with relating how and when she takes her medications.   Update 08/30/2018: The patient was last seen more than a year ago for cognitive changes after a left subdural hematoma. She is again accompanied by her Laura [name], her caregiver Laura [name], who help supplement the history today. A sign language interpreter helps with translation. Records and images were personally reviewed where available. Since her  last visit, she was admitted for altered mental status on 07/14/18, with confusion, inability to concentrate, misspelling during signing, gait difficulties, and dizziness. There was also report of staring. She had an MRI brain which showed a cluster of small acute infarcts in the distal left ACA versus MCA/ACA watershed territory, left temporal encephalomalacia. She had an echo which showed EF of 55-60%, mildly dilated left atrium. LDL 168, HbA1c 6.7. CTA head and neck showed occlusion of the left ACA in the A1 segment, minimal atherosclerotic disease at carotid bifurcations without stenosis, 30% left vertebral artery stenosis. Source of stroke unknown, concerning for cardiac arrhythmia, a 30-day event monitor was recommended. During her admission, after IV hydralazine, she had worsening right-sided weakness,repeat MRI showed mild extension of distal left ACA territory infarct. Treatment plan recommended was DAPT for 3 months, then Plavix alone. She was back in the ER on 07/29/18 after a fall, staff noted she was more confused for 2 days prior, very forgetful, sometimes staring as if not there. She was standing with her walker then fell to the right side, no loss of consciousness. MRI brain  no acute changes.  She reports doing better, no residual weakness from recent stroke. She had 24/7 supervision after hospital discharge, staff has reduced it to 12 hour supervision. Laura PikesSusan reports that she continues to have cognitive difficulties which has improved some since recent hospital stay. Initially she would stare off and appeared unable to complete tasks, needing instructions on how to brush her teeth (ie turn on faucet, etc). This is getting better. Laura PikesSusan reports difficulties understanding her medications, they plan to use a PillPack since she would mess around with her pillbox. She threw out her shower chair one time. Laura PikesSusan does not think she can keep a 30-day holter on, she pulls off everything once she is ready for bed.  She denies any headaches, dizziness, vision changes, focal numbness/tingling/weakness. She initially had 24/7 supervision, now down to 12-hour supervision. Staff has noted that she would stuff her mouth with food if unsupervised, leading to choking. She reports she is getting better with this.   PAST MEDICAL HISTORY: Past Medical History:  Diagnosis Date  . Anxiety state, unspecified 10/10/2007  . Arthritis   . ASYMPTOMATIC POSTMENOPAUSAL STATUS 04/15/2009  . Cataract    removed both eyes   . Deaf   . Dermatitis   . Headache(784.0) 08/27/2008  . HYPERLIPIDEMIA 06/22/2007   rx refused  . HYPERTENSION 05/07/2008  . HYPOTHYROIDISM 04/15/2009  . Mild mental retardation   . 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 RESTLESS LEG SYNDROME 10/01/2008  . Stroke (HCC)   . TACHYCARDIA 08/27/2008  . TREMOR 11/21/2007  . TRIGGER FINGER 10/30/2009    MEDICATIONS: Current Outpatient Medications on File Prior to Visit  Medication Sig Dispense Refill  . acetaminophen (TYLENOL) 325 MG tablet Take 1-2 tablets (325-650 mg total) by mouth every 4 (four) hours as needed for mild pain. (Patient not taking: Reported on 05/17/2019)    . acetaminophen (TYLENOL) 500 MG tablet Take 1,000 mg by mouth at bedtime. May also take 2 tablets (1000mg ) every 6 hours as needed for pain    . alendronate (FOSAMAX) 70 MG tablet TAKE 1 TABLET BY MOUTH ONCE A WEEK .  TAKE  WITH  A  FULL  GLASS  OF  WATER  ON  AN  EMPTY  STOMACH (Patient taking differently: Take 70 mg by mouth once a week. Every Saturday) 4 tablet 2  . aspirin EC 81 MG tablet Take 81 mg by mouth daily.    Marland Kitchen. atorvastatin (LIPITOR) 80 MG tablet Take 1 tablet (80 mg total) by mouth daily at 6 PM. 90 tablet 3  . camphor-menthol (SARNA) lotion Apply 1 application topically at bedtime as needed for itching. Apply to both forearms and lower legs    . cholecalciferol (VITAMIN D) 1000 UNITS tablet Take 1,000 Units by mouth daily.    . clonazePAM (KLONOPIN) 1 MG tablet Take  0.5 mg by mouth 2 (two) times daily as needed for anxiety.    . clopidogrel (PLAVIX) 75 MG tablet Take 1 tablet (75 mg total) by mouth daily. 90 tablet 3  . levETIRAcetam (KEPPRA) 500 MG tablet Take 1 tablet (500 mg total) by mouth 2 (two) times daily. 180 tablet 3  . levothyroxine (SYNTHROID) 50 MCG tablet Take 50 mcg by mouth daily before breakfast.    . levothyroxine (SYNTHROID, LEVOTHROID) 75 MCG tablet Take 1 tablet (75 mcg total) by mouth daily. (Patient not taking: Reported on 05/17/2019) 90 tablet 0  . loratadine (CLARITIN) 10 MG tablet Take 1 tablet (10 mg total) by mouth daily as needed for  allergies. (Patient not taking: Reported on 05/17/2019)    . metFORMIN (GLUCOPHAGE-XR) 500 MG 24 hr tablet Take 1 tablet (500 mg total) by mouth daily with breakfast. 90 tablet 3  . Multiple Vitamin (MULTIVITAMIN) tablet Take 1 tablet by mouth daily.      Marland Kitchen Propylene Glycol (SYSTANE BALANCE OP) Place 1 drop into both eyes 3 (three) times daily.    Marland Kitchen rOPINIRole (REQUIP) 2 MG tablet Take 2 mg by mouth at bedtime.    . sertraline (ZOLOFT) 25 MG tablet Take 25 mg by mouth daily.    Marland Kitchen triamcinolone cream (KENALOG) 0.1 % APPLY  CREAM EXTERNALLY THREE TIMES DAILY AS NEEDED FOR ITCHING (Patient not taking: Reported on 05/17/2019) 80 g 2   No current facility-administered medications on file prior to visit.    ALLERGIES: No Known Allergies  FAMILY HISTORY: Family History  Problem Relation Age of Onset  . Thyroid disease Neg Hx   . Colon cancer Neg Hx   . Colon polyps Neg Hx   . Rectal cancer Neg Hx   . Stomach cancer Neg Hx     SOCIAL HISTORY: Social History   Socioeconomic History  . Marital status: Single    Spouse name: Not on file  . Number of children: Not on file  . Years of education: Not on file  . Highest education level: Not on file  Occupational History  . Occupation: Disabled    Employer: UNEMPLOYED  Tobacco Use  . Smoking status: Never Smoker  . Smokeless tobacco: Never Used   Vaping Use  . Vaping Use: Unknown  Substance and Sexual Activity  . Alcohol use: No  . Drug use: No  . Sexual activity: Not on file  Other Topics Concern  . Not on file  Social History Narrative   Lives alone   Never married   Physical activity is very good   She has never had a driver's license   Does not have a phone at home    Social Determinants of Health   Financial Resource Strain: Not on file  Food Insecurity: Not on file  Transportation Needs: Not on file  Physical Activity: Not on file  Stress: Not on file  Social Connections: Not on file  Intimate Partner Violence: Not on file     PHYSICAL EXAM: Vitals:   04/07/21 1127  BP: (!) 149/89  Pulse: 97  SpO2: 96%   General: No acute distress Head:  Normocephalic/atraumatic Skin/Extremities: No rash, no edema Neurological Exam: alert and awake. She is deaf, communicates well via sign language. Attention and concentration are reduced, she was initially fixated on her eye and needed redirection to discuss leg symptoms. Cranial nerves: Pupils equal, round. Extraocular movements intact with no nystagmus. Visual fields full.  No facial asymmetry.  Motor: Bulk and tone normal, muscle strength 5/5 throughout with no pronator drift.   Finger to nose testing intact.  Gait wide-based, no ataxia. No tremor.    IMPRESSION: This is a pleasant 80 yo RH woman with a history of  hypertension, hyperlipidemia, autism spectrum disorder, deaf, left temporal lobe hemorrhage and left subdural hematoma in May 2018, left ACA stroke in August 2019. She was lost to follow-up for almost 3 years and presents today for RLS. Her HCPOA who has known her for over 20 years notes a significant component of anxiety as well. She is on ropinorole 2mg  qhs and clonazepam. We discussed adding on gabapentin for RLS, this may help with anxiety as well. Start  gabapentin 300mg  qhs, side effects discussed, if she is too drowsy on this dose, we can reduce to 100mg   qhs. RLS may be worsened by iron insufficiency, iron studies will be ordered. Continue [name] medications, she does have risk factors for seizures but has been seizure-free on Levetiracetam after SDH in 2018, we may consider weaning off on next visit. Follow-up in 6 months, call for any changes.    Thank you for allowing me to participate in her care.  Please do not hesitate to call for any questions or concerns.   , M.D.   CC: 2019, FNP, Dr. Patrcia Dolly

## 2021-04-08 LAB — IRON,TIBC AND FERRITIN PANEL
%SAT: 17 % (calc) (ref 16–45)
Ferritin: 30 ng/mL (ref 16–288)
Iron: 60 ug/dL (ref 45–160)
TIBC: 350 mcg/dL (calc) (ref 250–450)

## 2021-04-25 DIAGNOSIS — R0981 Nasal congestion: Secondary | ICD-10-CM | POA: Diagnosis not present

## 2021-04-25 DIAGNOSIS — R519 Headache, unspecified: Secondary | ICD-10-CM | POA: Diagnosis not present

## 2021-05-02 DIAGNOSIS — H5789 Other specified disorders of eye and adnexa: Secondary | ICD-10-CM | POA: Diagnosis not present

## 2021-05-02 DIAGNOSIS — R519 Headache, unspecified: Secondary | ICD-10-CM | POA: Diagnosis not present

## 2021-05-23 DIAGNOSIS — H9193 Unspecified hearing loss, bilateral: Secondary | ICD-10-CM | POA: Diagnosis not present

## 2021-05-23 DIAGNOSIS — N1831 Chronic kidney disease, stage 3a: Secondary | ICD-10-CM | POA: Diagnosis not present

## 2021-05-23 DIAGNOSIS — H04123 Dry eye syndrome of bilateral lacrimal glands: Secondary | ICD-10-CM | POA: Diagnosis not present

## 2021-05-23 DIAGNOSIS — F840 Autistic disorder: Secondary | ICD-10-CM | POA: Diagnosis not present

## 2021-05-23 DIAGNOSIS — R519 Headache, unspecified: Secondary | ICD-10-CM | POA: Diagnosis not present

## 2021-05-23 DIAGNOSIS — G2581 Restless legs syndrome: Secondary | ICD-10-CM | POA: Diagnosis not present

## 2021-06-06 DIAGNOSIS — R0981 Nasal congestion: Secondary | ICD-10-CM | POA: Diagnosis not present

## 2021-08-20 DIAGNOSIS — Z23 Encounter for immunization: Secondary | ICD-10-CM | POA: Diagnosis not present

## 2021-10-06 ENCOUNTER — Other Ambulatory Visit: Payer: Self-pay

## 2021-10-06 ENCOUNTER — Encounter: Payer: Self-pay | Admitting: Neurology

## 2021-10-06 ENCOUNTER — Ambulatory Visit (INDEPENDENT_AMBULATORY_CARE_PROVIDER_SITE_OTHER): Payer: Medicare Other | Admitting: Neurology

## 2021-10-06 VITALS — BP 150/87 | HR 92 | Ht 62.0 in | Wt 137.6 lb

## 2021-10-06 DIAGNOSIS — Z8673 Personal history of transient ischemic attack (TIA), and cerebral infarction without residual deficits: Secondary | ICD-10-CM | POA: Diagnosis not present

## 2021-10-06 DIAGNOSIS — Z87898 Personal history of other specified conditions: Secondary | ICD-10-CM | POA: Diagnosis not present

## 2021-10-06 DIAGNOSIS — G2581 Restless legs syndrome: Secondary | ICD-10-CM

## 2021-10-06 MED ORDER — GABAPENTIN 300 MG PO CAPS: 300.0000 mg | ORAL_CAPSULE | Freq: Every day | ORAL | 3 refills | Status: DC

## 2021-10-06 NOTE — Patient Instructions (Addendum)
Good to see you! Continue all your medications. Follow-up in 8 months, call for any changes. 

## 2021-10-06 NOTE — Progress Notes (Signed)
NEUROLOGY FOLLOW UP OFFICE NOTE  Laura [name] [id number] [birth year]  HISTORY OF PRESENT ILLNESS: I had the pleasure of seeing Laura [name] in follow-up in the neurology clinic on 10/06/2021.  The patient was last seen 6 months ago for RLS. She is again accompanied by her POA Laura [name] who helps supplement the history today. A sign language interpreter, Laura [name], is present.  Records and images were personally reviewed where available.  On her last visit, she presented for evaluation fo RLS. Ferritin level in 03/2021 was normal. She was started on Gabapentin 300mg  qhs, in addition to Ropinirole 2mg  qhs and clonazepam 1/2 tablet daily. She and report that she is doing much better. notes she does not seem to complain as much about her legs. She states her legs are not very nervous anymore. Laura [name] also notes less anxiety. She is now in assisted living where staff administers medications and she gets them regularly. Sleep and appetite are good. They deny any seizures, shce has been seizure-free since around 2019 on Levetiracetam 500mg  BID. Laura [name] notes she is noticing more changes related to aging, she is more forgetful, needing more repeated instructions or explanations. She needs assistance with dressing and bathing. She ambulates with a walker, no falls. She denies any headaches, dizziness, she again perseverates on her eyes and they remind her she is here for her brain and legs.    History on Initial Assessment 06/03/2017: This is a pleasant 80 year old woman with a history of hypertension, hyperlipidemia, autism spectrum disorder, deaf, presenting after hospitalization from May 15-22, 2018. Records were reviewed. She lives independently at [facility] and was last known normal the evening before admission. She had not been seen the next morning, and staff found her around 11am on a chair in her room, confused and covered in emesis. She was brought to [facility] where head CT showed a left  temporal lobe hematoma with surrounding intraparenchymal hemorrhages, acute left holohemispheric subdural hematoma with acute falcotentorial subdural hematoma with left to right shift. There was a small right parietal scalp hematoma. No skull fracture. Bleed was felt to be traumatic in etiology. She was monitored clinically, no surgical intervention done. She started to become less confused and was discharged to rehab. She had some encephalopathy when first returning to [facility], she has been under 24/7 care and initially needed help with bathing, but has shown improvement, she can now dress and bathe independently. She reports she is doing fine, "I'm happy." She denies any headaches, dizziness, diplopia, dysphagia, neck/back pain, focal numbness/tingling/weakness, bowel/bladder dysfunction. She has a history of an internal sensation of shaking in her legs, she states she takes medicine that helps. She had previously been ambulating independently but had balance issues, but since the bleed, balance has been worse and she has been using the walker. She would sometimes forget to use her walker and leave it in the room. Her HCPOA reports that staff has been trying to stimulate her and get her back to a routine. She functions on the autism spectrum where she is very rigid and rule-driven, she has to take her medications and meals at specific times. They are trying to get her back to a set routine so she can stay independent, they have not trained her to put on Depends at night . Her HCPOA reports she still has a lot of deficits, she has memory loss where things she used to remember off the top of her head are a struggle, such  as names or an event. She would say "Laura [name] did this with me," but she actually did not. She used to finger spell quickly, now this is a struggle. Her reasoning skills have always been limited, but it is worse. Vision problems seem to have worsened as well. She is talking to herself a lot  now, she has a video phone she uses to communicate with other hearing impaired persons, now she would pick up the phone and watch the video of herself, signing to herself as if communicating with another person, telling a whole story, then turning to Laura [name] and saying she is done. They watched some of them on video, she would get lost in part of the story or forget names and stumble. Sometimes she does this in front of a mirror. When she first came home, she would stare off then come back, they are seeing less of this now, probably a couple of times a week. No convulsive activity or body jerks noted. She was started on Keppra in the hospital for seizure prophylaxis, she has not seen Neurosurgery since discharge and continues on medication with no significant side effects. She states she takes her medications herself twice a day, she is pretty good with relating how and when she takes her medications.   Update 08/30/2018: The patient was last seen more than a year ago for cognitive changes after a left subdural hematoma. She is again accompanied by her Laura [name], her caregiver Laura [name], who help supplement the history today. A sign language interpreter helps with translation. Records and images were personally reviewed where available. Since her last visit, she was admitted for altered mental status on 07/14/18, with confusion, inability to concentrate, misspelling during signing, gait difficulties, and dizziness. There was also report of staring. She had an MRI brain which showed a cluster of small acute infarcts in the distal left ACA versus MCA/ACA watershed territory, left temporal encephalomalacia. She had an echo which showed EF of 55-60%, mildly dilated left atrium. LDL 168, HbA1c 6.7. CTA head and neck showed occlusion of the left ACA in the A1 segment, minimal atherosclerotic disease at carotid bifurcations without stenosis, 30% left vertebral artery stenosis. Source of stroke unknown, concerning for cardiac  arrhythmia, a 30-day event monitor was recommended. During her admission, after IV hydralazine, she had worsening right-sided weakness,repeat MRI showed mild extension of distal left ACA territory infarct. Treatment plan recommended was DAPT for 3 months, then Plavix alone. She was back in the ER on 07/29/18 after a fall, staff noted she was more confused for 2 days prior, very forgetful, sometimes staring as if not there. She was standing with her walker then fell to the right side, no loss of consciousness. MRI brain  no acute changes. She reports doing better, no residual weakness from recent stroke. She had 24/7 supervision after hospital discharge, staff has reduced it to 12 hour supervision. Laura [name] reports that she continues to have cognitive difficulties which has improved some since recent hospital stay. Initially she would stare off and appeared unable to complete tasks, needing instructions on how to brush her teeth (ie turn on faucet, etc). This is getting better. Laura [name] reports difficulties understanding her medications, they plan to use a PillPack since she would mess around with her pillbox. She threw out her shower chair one time. Laura [name] does not think she can keep a 30-day holter on, she pulls off everything once she is ready for bed. She denies any headaches, dizziness, vision changes, focal numbness/tingling/weakness. She  initially had 24/7 supervision, now down to 12-hour supervision. Staff has noted that she would stuff her mouth with food if unsupervised, leading to choking. She reports she is getting better with this.  Update 04/07/2021: The patient was last seen in October 2019. She was initially seen for cognitive changes after left subdural hematoma in 2018. She had a left ACA stroke in 06/2018. She has been on Levetiracetam since the SDH in 2018, staring episodes were reported in 2019. She presents today for RLS/ongoing leg issues. She lives at [facility]. On her PCP visit in January 2022,  she reported her legs are nervous [name] of the time. Clonazepam 1/2 tab daily does not help. She is also on Ropinirole 2mg  at night. has known Laura [name] for over 20 years and notes that there has always been an anxiety component to her symptoms, but does feel that she has RLS symptoms. The patient reports that she needs her legs to be quiet, sometimes she needs to walk and feels a little better. She says her legs are "very nervous" and still shake. Laura [name] says she has had nerve problems and anxiety for the past 16 years. Laura [name] states she picks at herself a lot. Laura [name] notes that her legs do jerks sometimes, but feels the vibration feeling is more anxiety-related. She has tried several medications with Dr. Darl [name] in the past but would throw them out when she does not notice much change, Laura [name] reports clonazepam has helped the most. She reports she is otherwise doing fine and fixated on her eye problems post-surgery. She denies any headaches. She has a little dizziness that appears balance-related. No falls in the past year. Laura [name] reports cognition has improved since the stroke, she still notes that she forgets how to spell some words and would forget what she was saying in the middle of a sentence, "aging stuff." She is independent with dressing and bathing, staff checks on her every night. There have been no seizures or seizure-like symptoms. No focal weakness, Laura [name] has noticed it is a little harder to get out of the car.   PAST MEDICAL HISTORY: 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/2009   RESTLESS LEG SYNDROME 10/01/2008   Stroke (HCC)    TACHYCARDIA 08/27/2008    TREMOR 11/21/2007   TRIGGER FINGER 10/30/2009    MEDICATIONS: Current Outpatient Medications on File Prior to Visit  Medication Sig Dispense Refill   acetaminophen (TYLENOL) 325 MG tablet Take 1-2 tablets (325-650 mg total) by mouth every 4 (four) hours as needed for mild pain.     acetaminophen (TYLENOL) 500 MG tablet Take 1,000 mg by mouth at bedtime. May also take 2 tablets (1000mg ) every 6 hours as needed for pain     alendronate (FOSAMAX) 70 MG tablet TAKE 1 TABLET BY MOUTH ONCE A WEEK .  TAKE  WITH  A  FULL  GLASS  OF  WATER  ON  AN  EMPTY  STOMACH (Patient taking differently: Take 70 mg by mouth once a week. Every Saturday) 4 tablet 2   aspirin EC 81 MG tablet Take 81 mg by mouth daily.  atorvastatin (LIPITOR) 80 MG tablet Take 1 tablet (80 mg total) by mouth daily at 6 PM. 90 tablet 3   camphor-menthol (SARNA) lotion Apply 1 application topically at bedtime as needed for itching. Apply to both forearms and lower legs     cholecalciferol (VITAMIN D) 1000 UNITS tablet Take 1,000 Units by mouth daily.     clonazePAM (KLONOPIN) 1 MG tablet Take 0.5 mg by mouth 2 (two) times daily as needed for anxiety.     clopidogrel (PLAVIX) 75 MG tablet Take 1 tablet (75 mg total) by mouth daily. 90 tablet 3   gabapentin (NEURONTIN) 300 MG capsule Take 1 capsule (300 mg total) by mouth at bedtime. 30 capsule 11   levETIRAcetam (KEPPRA) 500 MG tablet Take 1 tablet (500 mg total) by mouth 2 (two) times daily. 180 tablet 3   levothyroxine (SYNTHROID) 50 MCG tablet Take 50 mcg by mouth daily before breakfast.     loratadine (CLARITIN) 10 MG tablet Take 1 tablet (10 mg total) by mouth daily as needed for allergies.     metFORMIN (GLUCOPHAGE-XR) 500 MG 24 hr tablet Take 1 tablet (500 mg total) by mouth daily with breakfast. 90 tablet 3   Multiple Vitamin (MULTIVITAMIN) tablet Take 1 tablet by mouth daily.     Propylene Glycol (SYSTANE BALANCE OP) Place 1 drop into both eyes 3 (three) times daily.      rOPINIRole (REQUIP) 2 MG tablet Take 2 mg by mouth at bedtime.     sertraline (ZOLOFT) 25 MG tablet Take 25 mg by mouth daily.     triamcinolone cream (KENALOG) 0.1 % APPLY  CREAM EXTERNALLY THREE TIMES DAILY AS NEEDED FOR ITCHING (Patient taking differently: APPLY  CREAM EXTERNALLY THREE TIMES DAILY AS NEEDED FOR ITCHING) 80 g 2   No current facility-administered medications on file prior to visit.    ALLERGIES: No Known Allergies  FAMILY HISTORY: Family History  Problem Relation Age of Onset   Thyroid disease Neg Hx    Colon cancer Neg Hx    Colon polyps Neg Hx    Rectal cancer Neg Hx    Stomach cancer Neg Hx     SOCIAL HISTORY: Social History   Socioeconomic History   Marital status: Single    Spouse name: Not on file   Number of children: Not on file   Years of education: Not on file   Highest education level: Not on file  Occupational History   Occupation: Disabled    Employer: UNEMPLOYED  Tobacco Use   Smoking status: Never   Smokeless tobacco: Never  Vaping Use   Vaping Use: Never used  Substance and Sexual Activity   Alcohol use: No   Drug use: No   Sexual activity: Not on file  Other Topics Concern   Not on file  Social History Narrative   Lives alone   Never married   Physical activity is very good   She has never had a driver's license   Does not have a phone at home    Right handed    Social Determinants of Health   Financial Resource Strain: Not on file  Food Insecurity: Not on file  Transportation Needs: Not on file  Physical Activity: Not on file  Stress: Not on file  Social Connections: Not on file  Intimate Partner Violence: Not on file     PHYSICAL EXAM: Vitals:   10/06/21 1110  BP: (!) 150/87  Pulse: 92  SpO2: 95%   General: No  acute distress Head:  Normocephalic/atraumatic Skin/Extremities: No rash, no edema Neurological Exam: alert and awake. She is able to communicate well with sign language, needing repeated reminders  that she is not here for her eyes. Attention and concentration are reduced.  Cranial nerves: Pupils equal, round. Extraocular movements intact with no nystagmus. Visual fields full.  No facial asymmetry.  Motor: Bulk and tone normal, muscle strength 5/5 throughout with no pronator drift.   Finger to nose testing intact.  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 Main concern on last visit was RLS which has improved with low dose gabapentin  qhs. She is also on Ropinirole  qhs and prn clonazepam. Continue close supervision. Follow-up in 8 months, they know to call for any changes.    Thank you for allowing me to participate in her care.  Please do not hesitate to call for any questions or concerns.   Patrcia Dolly, M.D.   CC: Dr. Corliss Blacker

## 2021-10-22 DIAGNOSIS — B351 Tinea unguium: Secondary | ICD-10-CM | POA: Diagnosis not present

## 2021-10-22 DIAGNOSIS — G9009 Other idiopathic peripheral autonomic neuropathy: Secondary | ICD-10-CM | POA: Diagnosis not present

## 2021-11-20 DIAGNOSIS — R1312 Dysphagia, oropharyngeal phase: Secondary | ICD-10-CM | POA: Diagnosis not present

## 2021-11-29 DIAGNOSIS — R1312 Dysphagia, oropharyngeal phase: Secondary | ICD-10-CM | POA: Diagnosis not present

## 2021-12-03 DIAGNOSIS — R1312 Dysphagia, oropharyngeal phase: Secondary | ICD-10-CM | POA: Diagnosis not present

## 2021-12-09 DIAGNOSIS — R1312 Dysphagia, oropharyngeal phase: Secondary | ICD-10-CM | POA: Diagnosis not present

## 2021-12-10 DIAGNOSIS — R1312 Dysphagia, oropharyngeal phase: Secondary | ICD-10-CM | POA: Diagnosis not present

## 2021-12-12 DIAGNOSIS — E1159 Type 2 diabetes mellitus with other circulatory complications: Secondary | ICD-10-CM | POA: Diagnosis not present

## 2021-12-12 DIAGNOSIS — R1312 Dysphagia, oropharyngeal phase: Secondary | ICD-10-CM | POA: Diagnosis not present

## 2021-12-12 DIAGNOSIS — M810 Age-related osteoporosis without current pathological fracture: Secondary | ICD-10-CM | POA: Diagnosis not present

## 2021-12-12 DIAGNOSIS — G40909 Epilepsy, unspecified, not intractable, without status epilepticus: Secondary | ICD-10-CM | POA: Diagnosis not present

## 2021-12-12 DIAGNOSIS — H04123 Dry eye syndrome of bilateral lacrimal glands: Secondary | ICD-10-CM | POA: Diagnosis not present

## 2021-12-12 DIAGNOSIS — R0981 Nasal congestion: Secondary | ICD-10-CM | POA: Diagnosis not present

## 2021-12-12 DIAGNOSIS — H9193 Unspecified hearing loss, bilateral: Secondary | ICD-10-CM | POA: Diagnosis not present

## 2021-12-12 DIAGNOSIS — G2581 Restless legs syndrome: Secondary | ICD-10-CM | POA: Diagnosis not present

## 2021-12-12 DIAGNOSIS — R69 Illness, unspecified: Secondary | ICD-10-CM | POA: Diagnosis not present

## 2021-12-12 DIAGNOSIS — N1831 Chronic kidney disease, stage 3a: Secondary | ICD-10-CM | POA: Diagnosis not present

## 2021-12-22 DIAGNOSIS — R0981 Nasal congestion: Secondary | ICD-10-CM | POA: Diagnosis not present

## 2021-12-23 DIAGNOSIS — R1312 Dysphagia, oropharyngeal phase: Secondary | ICD-10-CM | POA: Diagnosis not present

## 2021-12-26 DIAGNOSIS — R1312 Dysphagia, oropharyngeal phase: Secondary | ICD-10-CM | POA: Diagnosis not present

## 2022-01-09 DIAGNOSIS — R1312 Dysphagia, oropharyngeal phase: Secondary | ICD-10-CM | POA: Diagnosis not present

## 2022-01-19 DIAGNOSIS — E119 Type 2 diabetes mellitus without complications: Secondary | ICD-10-CM | POA: Diagnosis not present

## 2022-02-17 DIAGNOSIS — R1312 Dysphagia, oropharyngeal phase: Secondary | ICD-10-CM | POA: Diagnosis not present

## 2022-06-08 ENCOUNTER — Encounter: Payer: Self-pay | Admitting: Neurology

## 2022-06-08 ENCOUNTER — Ambulatory Visit: Payer: Medicare HMO | Admitting: Neurology

## 2022-06-08 DIAGNOSIS — Z029 Encounter for administrative examinations, unspecified: Secondary | ICD-10-CM

## 2023-03-01 DIAGNOSIS — E1122 Type 2 diabetes mellitus with diabetic chronic kidney disease: Secondary | ICD-10-CM | POA: Diagnosis not present

## 2023-03-01 DIAGNOSIS — R600 Localized edema: Secondary | ICD-10-CM | POA: Diagnosis not present

## 2023-03-01 DIAGNOSIS — I129 Hypertensive chronic kidney disease with stage 1 through stage 4 chronic kidney disease, or unspecified chronic kidney disease: Secondary | ICD-10-CM | POA: Diagnosis not present

## 2023-03-01 DIAGNOSIS — E039 Hypothyroidism, unspecified: Secondary | ICD-10-CM | POA: Diagnosis not present

## 2023-03-01 DIAGNOSIS — G40909 Epilepsy, unspecified, not intractable, without status epilepticus: Secondary | ICD-10-CM | POA: Diagnosis not present

## 2023-03-01 DIAGNOSIS — E559 Vitamin D deficiency, unspecified: Secondary | ICD-10-CM | POA: Diagnosis not present

## 2023-03-01 DIAGNOSIS — M810 Age-related osteoporosis without current pathological fracture: Secondary | ICD-10-CM | POA: Diagnosis not present

## 2023-03-01 DIAGNOSIS — E1159 Type 2 diabetes mellitus with other circulatory complications: Secondary | ICD-10-CM | POA: Diagnosis not present

## 2023-03-01 DIAGNOSIS — N1831 Chronic kidney disease, stage 3a: Secondary | ICD-10-CM | POA: Diagnosis not present

## 2023-03-01 DIAGNOSIS — I10 Essential (primary) hypertension: Secondary | ICD-10-CM | POA: Diagnosis not present

## 2023-03-01 DIAGNOSIS — Z0000 Encounter for general adult medical examination without abnormal findings: Secondary | ICD-10-CM | POA: Diagnosis not present

## 2023-03-01 DIAGNOSIS — Z23 Encounter for immunization: Secondary | ICD-10-CM | POA: Diagnosis not present

## 2023-03-01 DIAGNOSIS — G2581 Restless legs syndrome: Secondary | ICD-10-CM | POA: Diagnosis not present

## 2023-03-01 DIAGNOSIS — E785 Hyperlipidemia, unspecified: Secondary | ICD-10-CM | POA: Diagnosis not present

## 2023-03-29 DIAGNOSIS — E119 Type 2 diabetes mellitus without complications: Secondary | ICD-10-CM | POA: Diagnosis not present

## 2023-03-29 DIAGNOSIS — Z0100 Encounter for examination of eyes and vision without abnormal findings: Secondary | ICD-10-CM | POA: Diagnosis not present

## 2023-09-27 DIAGNOSIS — E039 Hypothyroidism, unspecified: Secondary | ICD-10-CM | POA: Diagnosis not present

## 2024-03-09 DIAGNOSIS — Z113 Encounter for screening for infections with a predominantly sexual mode of transmission: Secondary | ICD-10-CM | POA: Diagnosis not present

## 2024-04-24 ENCOUNTER — Observation Stay (HOSPITAL_COMMUNITY)

## 2024-04-24 ENCOUNTER — Encounter (HOSPITAL_COMMUNITY): Payer: Self-pay

## 2024-04-24 ENCOUNTER — Emergency Department (HOSPITAL_COMMUNITY)

## 2024-04-24 ENCOUNTER — Inpatient Hospital Stay (HOSPITAL_COMMUNITY)
Admission: EM | Admit: 2024-04-24 | Discharge: 2024-05-01 | DRG: 291 | Disposition: A | Discharge status: Skilled Nursing Facility | Attending: Internal Medicine | Admitting: Internal Medicine

## 2024-04-24 ENCOUNTER — Emergency Department (HOSPITAL_BASED_OUTPATIENT_CLINIC_OR_DEPARTMENT_OTHER)

## 2024-04-24 ENCOUNTER — Other Ambulatory Visit: Payer: Self-pay

## 2024-04-24 DIAGNOSIS — N1831 Chronic kidney disease, stage 3a: Secondary | ICD-10-CM | POA: Diagnosis present

## 2024-04-24 DIAGNOSIS — I517 Cardiomegaly: Secondary | ICD-10-CM | POA: Insufficient documentation

## 2024-04-24 DIAGNOSIS — R8271 Bacteriuria: Secondary | ICD-10-CM | POA: Diagnosis present

## 2024-04-24 DIAGNOSIS — D631 Anemia in chronic kidney disease: Secondary | ICD-10-CM | POA: Diagnosis present

## 2024-04-24 DIAGNOSIS — E785 Hyperlipidemia, unspecified: Secondary | ICD-10-CM | POA: Diagnosis present

## 2024-04-24 DIAGNOSIS — Z66 Do not resuscitate: Secondary | ICD-10-CM | POA: Diagnosis not present

## 2024-04-24 DIAGNOSIS — S2249XA Multiple fractures of ribs, unspecified side, initial encounter for closed fracture: Secondary | ICD-10-CM | POA: Diagnosis not present

## 2024-04-24 DIAGNOSIS — I10 Essential (primary) hypertension: Secondary | ICD-10-CM | POA: Diagnosis present

## 2024-04-24 DIAGNOSIS — L03116 Cellulitis of left lower limb: Secondary | ICD-10-CM | POA: Diagnosis present

## 2024-04-24 DIAGNOSIS — E871 Hypo-osmolality and hyponatremia: Secondary | ICD-10-CM | POA: Diagnosis present

## 2024-04-24 DIAGNOSIS — I161 Hypertensive emergency: Secondary | ICD-10-CM | POA: Diagnosis present

## 2024-04-24 DIAGNOSIS — H5789 Other specified disorders of eye and adnexa: Secondary | ICD-10-CM | POA: Diagnosis present

## 2024-04-24 DIAGNOSIS — E119 Type 2 diabetes mellitus without complications: Secondary | ICD-10-CM

## 2024-04-24 DIAGNOSIS — R0602 Shortness of breath: Secondary | ICD-10-CM | POA: Diagnosis not present

## 2024-04-24 DIAGNOSIS — F411 Generalized anxiety disorder: Secondary | ICD-10-CM | POA: Diagnosis present

## 2024-04-24 DIAGNOSIS — Z515 Encounter for palliative care: Secondary | ICD-10-CM

## 2024-04-24 DIAGNOSIS — E782 Mixed hyperlipidemia: Secondary | ICD-10-CM | POA: Diagnosis present

## 2024-04-24 DIAGNOSIS — R4589 Other symptoms and signs involving emotional state: Secondary | ICD-10-CM

## 2024-04-24 DIAGNOSIS — I5023 Acute on chronic systolic (congestive) heart failure: Secondary | ICD-10-CM

## 2024-04-24 DIAGNOSIS — I420 Dilated cardiomyopathy: Secondary | ICD-10-CM

## 2024-04-24 DIAGNOSIS — I447 Left bundle-branch block, unspecified: Secondary | ICD-10-CM | POA: Diagnosis present

## 2024-04-24 DIAGNOSIS — I130 Hypertensive heart and chronic kidney disease with heart failure and stage 1 through stage 4 chronic kidney disease, or unspecified chronic kidney disease: Secondary | ICD-10-CM | POA: Diagnosis not present

## 2024-04-24 DIAGNOSIS — E1122 Type 2 diabetes mellitus with diabetic chronic kidney disease: Secondary | ICD-10-CM | POA: Diagnosis present

## 2024-04-24 DIAGNOSIS — D649 Anemia, unspecified: Secondary | ICD-10-CM | POA: Diagnosis present

## 2024-04-24 DIAGNOSIS — Z8673 Personal history of transient ischemic attack (TIA), and cerebral infarction without residual deficits: Secondary | ICD-10-CM

## 2024-04-24 DIAGNOSIS — G9341 Metabolic encephalopathy: Secondary | ICD-10-CM | POA: Diagnosis not present

## 2024-04-24 DIAGNOSIS — F840 Autistic disorder: Secondary | ICD-10-CM | POA: Diagnosis present

## 2024-04-24 DIAGNOSIS — Z603 Acculturation difficulty: Secondary | ICD-10-CM | POA: Diagnosis present

## 2024-04-24 DIAGNOSIS — Z79890 Hormone replacement therapy (postmenopausal): Secondary | ICD-10-CM

## 2024-04-24 DIAGNOSIS — I509 Heart failure, unspecified: Principal | ICD-10-CM

## 2024-04-24 DIAGNOSIS — E8770 Fluid overload, unspecified: Secondary | ICD-10-CM | POA: Diagnosis not present

## 2024-04-24 DIAGNOSIS — E7800 Pure hypercholesterolemia, unspecified: Secondary | ICD-10-CM

## 2024-04-24 DIAGNOSIS — Z7982 Long term (current) use of aspirin: Secondary | ICD-10-CM

## 2024-04-24 DIAGNOSIS — I5043 Acute on chronic combined systolic (congestive) and diastolic (congestive) heart failure: Secondary | ICD-10-CM | POA: Diagnosis present

## 2024-04-24 DIAGNOSIS — Z7983 Long term (current) use of bisphosphonates: Secondary | ICD-10-CM

## 2024-04-24 DIAGNOSIS — E861 Hypovolemia: Secondary | ICD-10-CM | POA: Diagnosis present

## 2024-04-24 DIAGNOSIS — J9612 Chronic respiratory failure with hypercapnia: Secondary | ICD-10-CM | POA: Diagnosis present

## 2024-04-24 DIAGNOSIS — Z79899 Other long term (current) drug therapy: Secondary | ICD-10-CM

## 2024-04-24 DIAGNOSIS — H9193 Unspecified hearing loss, bilateral: Secondary | ICD-10-CM | POA: Diagnosis present

## 2024-04-24 DIAGNOSIS — E039 Hypothyroidism, unspecified: Secondary | ICD-10-CM | POA: Diagnosis present

## 2024-04-24 DIAGNOSIS — F70 Mild intellectual disabilities: Secondary | ICD-10-CM | POA: Diagnosis present

## 2024-04-24 DIAGNOSIS — R0989 Other specified symptoms and signs involving the circulatory and respiratory systems: Secondary | ICD-10-CM | POA: Diagnosis not present

## 2024-04-24 DIAGNOSIS — Z7189 Other specified counseling: Secondary | ICD-10-CM

## 2024-04-24 DIAGNOSIS — R609 Edema, unspecified: Secondary | ICD-10-CM | POA: Diagnosis not present

## 2024-04-24 DIAGNOSIS — Z7984 Long term (current) use of oral hypoglycemic drugs: Secondary | ICD-10-CM

## 2024-04-24 DIAGNOSIS — I5021 Acute systolic (congestive) heart failure: Secondary | ICD-10-CM | POA: Insufficient documentation

## 2024-04-24 DIAGNOSIS — I455 Other specified heart block: Secondary | ICD-10-CM

## 2024-04-24 DIAGNOSIS — I429 Cardiomyopathy, unspecified: Secondary | ICD-10-CM | POA: Diagnosis present

## 2024-04-24 DIAGNOSIS — H9190 Unspecified hearing loss, unspecified ear: Secondary | ICD-10-CM

## 2024-04-24 DIAGNOSIS — R918 Other nonspecific abnormal finding of lung field: Secondary | ICD-10-CM | POA: Diagnosis not present

## 2024-04-24 DIAGNOSIS — G2581 Restless legs syndrome: Secondary | ICD-10-CM | POA: Diagnosis present

## 2024-04-24 DIAGNOSIS — Z7902 Long term (current) use of antithrombotics/antiplatelets: Secondary | ICD-10-CM

## 2024-04-24 DIAGNOSIS — M1990 Unspecified osteoarthritis, unspecified site: Secondary | ICD-10-CM | POA: Diagnosis present

## 2024-04-24 DIAGNOSIS — L03115 Cellulitis of right lower limb: Secondary | ICD-10-CM | POA: Diagnosis present

## 2024-04-24 DIAGNOSIS — G40909 Epilepsy, unspecified, not intractable, without status epilepticus: Secondary | ICD-10-CM | POA: Diagnosis present

## 2024-04-24 LAB — GLUCOSE, CAPILLARY: Glucose-Capillary: 147 mg/dL — ABNORMAL HIGH (ref 70–99)

## 2024-04-24 LAB — COMPREHENSIVE METABOLIC PANEL WITH GFR
ALT: 30 U/L (ref 0–44)
AST: 27 U/L (ref 15–41)
Albumin: 3.7 g/dL (ref 3.5–5.0)
Alkaline Phosphatase: 83 U/L (ref 38–126)
Anion gap: 12 (ref 5–15)
BUN: 17 mg/dL (ref 8–23)
CO2: 26 mmol/L (ref 22–32)
Calcium: 8.7 mg/dL — ABNORMAL LOW (ref 8.9–10.3)
Chloride: 89 mmol/L — ABNORMAL LOW (ref 98–111)
Creatinine, Ser: 0.85 mg/dL (ref 0.44–1.00)
GFR, Estimated: >60 mL/min (ref >60)
Glucose, Bld: 107 mg/dL — ABNORMAL HIGH (ref 70–99)
Potassium: 3.9 mmol/L (ref 3.5–5.1)
Sodium: 127 mmol/L — ABNORMAL LOW (ref 135–145)
Total Bilirubin: 1 mg/dL (ref 0.0–1.2)
Total Protein: 7.2 g/dL (ref 6.5–8.1)

## 2024-04-24 LAB — HEMOGLOBIN A1C
Hgb A1c MFr Bld: 8.5 % — ABNORMAL HIGH (ref 4.8–5.6)
Mean Plasma Glucose: 197.25 mg/dL

## 2024-04-24 LAB — URINALYSIS, W/ REFLEX TO CULTURE (INFECTION SUSPECTED)
Bilirubin Urine: NEGATIVE
Glucose, UA: NEGATIVE mg/dL
Hgb urine dipstick: NEGATIVE
Ketones, ur: NEGATIVE mg/dL
Leukocytes,Ua: NEGATIVE
Nitrite: NEGATIVE
Protein, ur: 30 mg/dL — AB
Specific Gravity, Urine: 1.005 (ref 1.005–1.030)
pH: 7 (ref 5.0–8.0)

## 2024-04-24 LAB — CBC WITH DIFFERENTIAL/PLATELET
Abs Immature Granulocytes: 0.05 10*3/uL (ref 0.00–0.07)
Basophils Absolute: 0.1 10*3/uL (ref 0.0–0.1)
Basophils Relative: 1 %
Eosinophils Absolute: 0.1 10*3/uL (ref 0.0–0.5)
Eosinophils Relative: 1 %
HCT: 38.4 % (ref 36.0–46.0)
Hemoglobin: 11.7 g/dL — ABNORMAL LOW (ref 12.0–15.0)
Immature Granulocytes: 1 %
Lymphocytes Relative: 9 %
Lymphs Abs: 0.9 10*3/uL (ref 0.7–4.0)
MCH: 24.9 pg — ABNORMAL LOW (ref 26.0–34.0)
MCHC: 30.5 g/dL (ref 30.0–36.0)
MCV: 81.9 fL (ref 80.0–100.0)
Monocytes Absolute: 1 10*3/uL (ref 0.1–1.0)
Monocytes Relative: 10 %
Neutro Abs: 8 10*3/uL — ABNORMAL HIGH (ref 1.7–7.7)
Neutrophils Relative %: 78 %
Platelets: 381 10*3/uL (ref 150–400)
RBC: 4.69 MIL/uL (ref 3.87–5.11)
RDW: 14.9 % (ref 11.5–15.5)
WBC: 10.1 10*3/uL (ref 4.0–10.5)
nRBC: 0 % (ref 0.0–0.2)

## 2024-04-24 LAB — BRAIN NATRIURETIC PEPTIDE: B Natriuretic Peptide: 659.6 pg/mL — ABNORMAL HIGH (ref 0.0–100.0)

## 2024-04-24 LAB — I-STAT CG4 LACTIC ACID, ED: Lactic Acid, Venous: 1.4 mmol/L (ref 0.5–1.9)

## 2024-04-24 MED ORDER — INSULIN ASPART 100 UNIT/ML IJ SOLN
0.0000 [IU] | Freq: Three times a day (TID) | INTRAMUSCULAR | Status: DC
  Administered 2024-04-25: 1 [IU] via SUBCUTANEOUS
  Administered 2024-04-25: 2 [IU] via SUBCUTANEOUS
  Administered 2024-04-26: 1 [IU] via SUBCUTANEOUS
  Administered 2024-04-27: 7 [IU] via SUBCUTANEOUS
  Administered 2024-04-27: 2 [IU] via SUBCUTANEOUS
  Administered 2024-04-28: 5 [IU] via SUBCUTANEOUS
  Filled 2024-04-24: qty 0.09

## 2024-04-24 MED ORDER — CLOPIDOGREL BISULFATE 75 MG PO TABS
75.0000 mg | ORAL_TABLET | Freq: Every day | ORAL | Status: DC
  Administered 2024-04-25 – 2024-05-01 (×7): 75 mg via ORAL
  Filled 2024-04-24 (×7): qty 1

## 2024-04-24 MED ORDER — ATORVASTATIN CALCIUM 40 MG PO TABS
80.0000 mg | ORAL_TABLET | Freq: Every day | ORAL | Status: DC
  Administered 2024-04-24 – 2024-04-30 (×7): 80 mg via ORAL
  Filled 2024-04-24 (×7): qty 2

## 2024-04-24 MED ORDER — ONDANSETRON HCL 4 MG/2ML IJ SOLN: 4.0000 mg | Freq: Four times a day (QID) | INTRAMUSCULAR | Status: DC | PRN

## 2024-04-24 MED ORDER — ENOXAPARIN SODIUM 40 MG/0.4ML IJ SOSY
40.0000 mg | PREFILLED_SYRINGE | INTRAMUSCULAR | Status: DC
  Administered 2024-04-24 – 2024-04-30 (×7): 40 mg via SUBCUTANEOUS
  Filled 2024-04-24 (×7): qty 0.4

## 2024-04-24 MED ORDER — LORATADINE 10 MG PO TABS
10.0000 mg | ORAL_TABLET | Freq: Every morning | ORAL | Status: DC
  Administered 2024-04-25 – 2024-05-01 (×7): 10 mg via ORAL
  Filled 2024-04-24 (×7): qty 1

## 2024-04-24 MED ORDER — LABETALOL HCL 5 MG/ML IV SOLN
10.0000 mg | Freq: Once | INTRAVENOUS | Status: AC
  Administered 2024-04-24: 10 mg via INTRAVENOUS
  Filled 2024-04-24: qty 4

## 2024-04-24 MED ORDER — ONDANSETRON HCL 4 MG PO TABS: 4.0000 mg | ORAL_TABLET | Freq: Four times a day (QID) | ORAL | Status: DC | PRN

## 2024-04-24 MED ORDER — CARVEDILOL 3.125 MG PO TABS
3.1250 mg | ORAL_TABLET | Freq: Two times a day (BID) | ORAL | Status: DC
  Administered 2024-04-24 – 2024-04-25 (×2): 3.125 mg via ORAL
  Filled 2024-04-24 (×2): qty 1

## 2024-04-24 MED ORDER — METFORMIN HCL ER 500 MG PO TB24: 1000.0000 mg | ORAL_TABLET | Freq: Every day | ORAL | Status: DC

## 2024-04-24 MED ORDER — FUROSEMIDE 10 MG/ML IJ SOLN
40.0000 mg | Freq: Once | INTRAMUSCULAR | Status: AC
  Administered 2024-04-24: 40 mg via INTRAVENOUS
  Filled 2024-04-24: qty 4

## 2024-04-24 MED ORDER — ACETAMINOPHEN 650 MG RE SUPP: 650.0000 mg | Freq: Four times a day (QID) | RECTAL | Status: DC | PRN

## 2024-04-24 MED ORDER — LEVETIRACETAM 500 MG PO TABS
500.0000 mg | ORAL_TABLET | Freq: Two times a day (BID) | ORAL | Status: DC
  Administered 2024-04-24 – 2024-05-01 (×14): 500 mg via ORAL
  Filled 2024-04-24 (×14): qty 1

## 2024-04-24 MED ORDER — ASPIRIN 81 MG PO CHEW
81.0000 mg | CHEWABLE_TABLET | Freq: Every day | ORAL | Status: DC
  Administered 2024-04-25 – 2024-05-01 (×7): 81 mg via ORAL
  Filled 2024-04-24 (×7): qty 1

## 2024-04-24 MED ORDER — LABETALOL HCL 5 MG/ML IV SOLN: 10.0000 mg | INTRAVENOUS | Status: DC | PRN

## 2024-04-24 MED ORDER — NAPHAZOLINE-GLYCERIN 0.012-0.25 % OP SOLN
1.0000 [drp] | Freq: Two times a day (BID) | OPHTHALMIC | Status: DC | PRN
  Administered 2024-04-25 – 2024-04-30 (×9): 1 [drp] via OPHTHALMIC
  Filled 2024-04-24: qty 15

## 2024-04-24 MED ORDER — LEVOTHYROXINE SODIUM 50 MCG PO TABS
75.0000 ug | ORAL_TABLET | Freq: Every day | ORAL | Status: DC
  Administered 2024-04-25 – 2024-05-01 (×7): 75 ug via ORAL
  Filled 2024-04-24 (×7): qty 1

## 2024-04-24 MED ORDER — VITAMIN D 25 MCG (1000 UNIT) PO TABS
1000.0000 [IU] | ORAL_TABLET | Freq: Every day | ORAL | Status: DC
  Administered 2024-04-25 – 2024-05-01 (×7): 1000 [IU] via ORAL
  Filled 2024-04-24 (×7): qty 1

## 2024-04-24 MED ORDER — IOHEXOL 300 MG/ML  SOLN
75.0000 mL | Freq: Once | INTRAMUSCULAR | Status: AC | PRN
  Administered 2024-04-24: 75 mL via INTRAVENOUS

## 2024-04-24 MED ORDER — HYDRALAZINE HCL 25 MG PO TABS
25.0000 mg | ORAL_TABLET | Freq: Four times a day (QID) | ORAL | Status: DC
  Administered 2024-04-24 – 2024-04-25 (×3): 25 mg via ORAL
  Filled 2024-04-24 (×3): qty 1

## 2024-04-24 MED ORDER — ROPINIROLE HCL 1 MG PO TABS
2.0000 mg | ORAL_TABLET | Freq: Every day | ORAL | Status: DC
  Administered 2024-04-24 – 2024-04-30 (×7): 2 mg via ORAL
  Filled 2024-04-24 (×7): qty 2

## 2024-04-24 MED ORDER — ACETAMINOPHEN 325 MG PO TABS
650.0000 mg | ORAL_TABLET | Freq: Four times a day (QID) | ORAL | Status: DC | PRN
  Administered 2024-04-24 – 2024-04-29 (×3): 650 mg via ORAL
  Filled 2024-04-24 (×3): qty 2

## 2024-04-24 MED ORDER — FLUTICASONE PROPIONATE 50 MCG/ACT NA SUSP
2.0000 | Freq: Every day | NASAL | Status: DC
  Administered 2024-04-25 – 2024-05-01 (×7): 2 via NASAL
  Filled 2024-04-24: qty 16

## 2024-04-24 NOTE — ED Provider Notes (Signed)
  EMERGENCY DEPARTMENT AT [facility] Provider Note   CSN: [id number] Arrival date & time: 04/24/24  1045     History  Chief Complaint  Patient presents with   Leg Swelling    Laura [name] is a 83 y.o. female.  HPI The patient is deaf and is on autism spectrum.  She has a caregiver who assists her and has noted for several weeks gradually increasing shortness of breath and swelling of the legs.  Patient's caregiver reports that last week she took her out shopping and she had to rest a number of times which is not typical.  Patient and the caregiver note that over the past several days she has had some significant increase swelling in her legs.  Patient reports the legs are somewhat painful.  She denies chest pain.  She does feel short of breath.  There is no documented fever or productive cough.    Home Medications Prior to Admission medications   Medication Sig Start Date End Date Taking? Authorizing Provider  acetaminophen  (TYLENOL ) 325 MG tablet Take 1-2 tablets (325-650 mg total) by mouth every 4 (four) hours as needed for mild pain. 07/21/18   Love, Renay Carota, PA-C  acetaminophen  (TYLENOL ) 500 MG tablet Take 1,000 mg by mouth at bedtime. May also take 2 tablets (1000mg ) every 6 hours as needed for pain    [provider]  alendronate  (FOSAMAX ) 70 MG tablet TAKE 1 TABLET BY MOUTH ONCE A WEEK .  TAKE  WITH  A  FULL  GLASS  OF  WATER  ON  AN  EMPTY  STOMACH Patient taking differently: Take 70 mg by mouth once a week. Every Saturday 08/22/18   Gwyndolyn Lerner, MD  aspirin  EC 81 MG tablet Take 81 mg by mouth daily.    [provider]  atorvastatin  (LIPITOR ) 80 MG tablet Take 1 tablet (80 mg total) by mouth daily at 6 PM. 08/30/18   Jhonny Moss, MD  camphor-menthol [facility]) lotion Apply 1 application topically at bedtime as needed for itching. Apply to both forearms and lower legs    [provider]  cholecalciferol (VITAMIN D ) 1000 UNITS  tablet Take 1,000 Units by mouth daily.    [provider]  clonazePAM  (KLONOPIN ) 1 MG tablet Take 0.5 mg by mouth 2 (two) times daily as needed for anxiety.    [provider]  clopidogrel  (PLAVIX ) 75 MG tablet Take 1 tablet (75 mg total) by mouth daily. 08/30/18   Jhonny Moss, MD  gabapentin  (NEURONTIN ) 300 MG capsule Take 1 capsule (300 mg total) by mouth at bedtime. 10/06/21   Jhonny Moss, MD  levETIRAcetam  (KEPPRA ) 500 MG tablet Take 1 tablet (500 mg total) by mouth 2 (two) times daily. 08/30/18   Jhonny Moss, MD  levothyroxine  (SYNTHROID ) 50 MCG tablet Take 50 mcg by mouth daily before breakfast.    [provider]  loratadine  (CLARITIN ) 10 MG tablet Take 1 tablet (10 mg total) by mouth daily as needed for allergies. 07/26/18   Love, Renay Carota, PA-C  metFORMIN  (GLUCOPHAGE -XR) 500 MG 24 hr tablet Take 1 tablet (500 mg total) by mouth daily with breakfast. 09/16/18   Gwyndolyn Lerner, MD  Multiple Vitamin (MULTIVITAMIN) tablet Take 1 tablet by mouth daily.    [provider]  Propylene Glycol (SYSTANE BALANCE OP) Place 1 drop into both eyes 3 (three) times daily.    [provider]  rOPINIRole (REQUIP) 2 MG tablet Take 2  mg by mouth at bedtime.    [provider]  sertraline (ZOLOFT) 25 MG tablet Take 25 mg by mouth daily.    [provider]  triamcinolone  cream (KENALOG ) 0.1 % APPLY  CREAM EXTERNALLY THREE TIMES DAILY AS NEEDED FOR ITCHING Patient taking differently: APPLY  CREAM EXTERNALLY THREE TIMES DAILY AS NEEDED FOR ITCHING 07/26/18   Love, Renay Carota, PA-C      Allergies    Patient has no known allergies.    Review of Systems   Review of Systems  Physical Exam Updated Vital Signs BP (!) 175/120 Comment: RN informed Right Arm  Pulse 95   Temp 98.5 F (36.9 C) (Oral)   Resp 16   SpO2 96%  Physical Exam Constitutional:      Comments: Patient is alert and cheerful. Moderate increased work of breathing at rest.   Nontoxic appearance.  HENT:     Mouth/Throat:     Pharynx: Oropharynx is clear.  Eyes:     Extraocular Movements: Extraocular movements intact.  Cardiovascular:     Rate and Rhythm: Regular rhythm. Tachycardia present.  Pulmonary:     Comments: Moderate increased work of breathing.  Crackles mid to lower lung fields Abdominal:     General: There is distension.     Palpations: Abdomen is soft.     Tenderness: There is no abdominal tenderness. There is no guarding.  Musculoskeletal:     Comments: 2-3+ pitting edema bilateral lower extremities.  Skin:    General: Skin is warm and dry.  Neurological:     Comments: He is alert.  She is interactive.  She is using sign language with ASL interpreter.  She follows commands appropriately.  No focal motor deficits.  Psychiatric:        Mood and Affect: Mood normal.     ED Results / Procedures / Treatments   Labs (all labs ordered are listed, but only abnormal results are displayed) Labs Reviewed  COMPREHENSIVE METABOLIC PANEL WITH GFR - Abnormal; Notable for the following components:      Result Value   Sodium 127 (*)    Chloride 89 (*)    Glucose, Bld 107 (*)    Calcium  8.7 (*)    All other components within normal limits  CBC WITH DIFFERENTIAL/PLATELET - Abnormal; Notable for the following components:   Hemoglobin 11.7 (*)    MCH 24.9 (*)    Neutro Abs 8.0 (*)    All other components within normal limits  URINALYSIS, W/ REFLEX TO CULTURE (INFECTION SUSPECTED) - Abnormal; Notable for the following components:   Protein, ur 30 (*)    Bacteria, UA MANY (*)    All other components within normal limits  BRAIN NATRIURETIC PEPTIDE - Abnormal; Notable for the following components:   B Natriuretic Peptide 659.6 (*)    All other components within normal limits  I-STAT CG4 LACTIC ACID, ED    EKG EKG Interpretation Date/Time:  Monday April 24 2024 12:24:25 EDT Ventricular Rate:  92 PR Interval:  189 QRS Duration:  129 QT  Interval:  398 QTC Calculation: 493 R Axis:   194  Text Interpretation: Sinus rhythm Nonspecific intraventricular conduction delay no sig change from previous Confirmed by Wynetta Heckle [phone]) on 04/24/2024 4:29:48 PM  Radiology DG Chest Port 1 View Result Date: 04/24/2024 CLINICAL DATA:  Shortness of breath EXAM: PORTABLE CHEST 1 VIEW COMPARISON:  Chest radiograph dated 07/29/2018 FINDINGS: Low lung volumes with bronchovascular crowding. New rounded right hilar opacity. Bibasilar  patchy and interstitial opacities. Trace blunting of bilateral costophrenic angles. No pneumothorax. Markedly enlarged cardiomediastinal silhouette. Bilateral healing and old rib fractures. IMPRESSION: 1. New rounded right hilar opacity, which may represent a pulmonary mass or lymphadenopathy. Recommend further evaluation with contrast-enhanced chest CT. 2. Bibasilar patchy and interstitial opacities, which may represent atelectasis, edema, or infection. 3. Markedly enlarged cardiomediastinal silhouette. 4. Trace blunting of bilateral costophrenic angles, which may represent trace pleural effusions. Electronically Signed   By: Limin  Xu M.D.   On: 04/24/2024 13:10    Procedures Procedures   CRITICAL CARE Performed by: Wynetta Heckle   Total critical care time: 30 minutes  Critical care time was exclusive of separately billable procedures and treating other patients.  Critical care was necessary to treat or prevent imminent or life-threatening deterioration.  Critical care was time spent personally by me on the following activities: development of treatment plan with patient and/or surrogate as well as nursing, discussions with consultants, evaluation of patient's response to treatment, examination of patient, obtaining history from patient or surrogate, ordering and performing treatments and interventions, ordering and review of laboratory studies, ordering and review of radiographic studies, pulse oximetry and  re-evaluation of patient's condition.  Medications Ordered in ED Medications  furosemide (LASIX) injection 40 mg (has no administration in time range)  labetalol  (NORMODYNE ) injection 10 mg (has no administration in time range)    ED Course/ Medical Decision Making/ A&P                                 Medical Decision Making Amount and/or Complexity of Data Reviewed Labs: ordered.  Risk Prescription drug management. Decision regarding hospitalization.   Patient presents as outlined.  She has had incrementally increasing dyspnea and lower extremity swelling.  Findings are suggestive of CHF.  Patient is hypertensive.  She denies any chest pain.  Review of echo on EMR is from 8\2019.  At that time systolic function normal with EF of 55% and grade 1 diastolic dysfunction.  I-STAT lactic 1.4 BNP 659 sodium 127 GFR greater than 60 LFTs normal white count 10 H&H 11.7 and 38 urinalysis 0-5 WBC 0-5 RBC.  Lower extremity venous ultrasound pending in process.  Chest x-ray by radiology bibasilar patchy interstitial opacities atelectasis versus edema or infection.  Cardiomegaly.  This time, findings are most consistent with CHF.  Patient has cardiomegaly and physical exam consistent with acute CHF.  She is alert with clear mental status and afebrile without significant leukocytosis.  Is appears less likely to be pneumonia.  Lasix started for diuresis and labetalol  started for hypertension.  Consult: Dr. Bonita Bussing for admission.        Final Clinical Impression(s) / ED Diagnoses Final diagnoses:  Acute congestive heart failure, unspecified heart failure type [facility])  Hypertensive emergency    Rx / DC Orders ED Discharge Orders     None         Wynetta Heckle, MD 04/24/24 1657

## 2024-04-24 NOTE — H&P (Signed)
 History and Physical    Patient: Laura [name]:[id number] DOB: [birth year] DOA: 04/24/2024 DOS: the patient was seen and examined on 04/24/2024 PCP: Helyn Lobstein, MD  Patient coming from: Home  Chief Complaint:  Chief Complaint  Patient presents with   Leg Swelling   HPI: Laura [name] is a 83 y.o. female with medical history significant of anxiety, osteoarthritis, cataracts, dermatitis, hyperlipidemia, hypertension, hypothyroidism, mild mental retardation, deafness, paronychia, restless leg syndrome, history of CVA, tachycardia, tremor trigger finger, type 2 diabetes who was brought to the emergency department due to lower extremity edema since Saturday associated with dyspnea on exertion.  Not sure given the limitations with communication if she had chest pain/pressure. She has a very liberal sodium intake.  No prior episodes of this. No palpitations. He denied fever, chills, rhinorrhea, sore throat, wheezing or hemoptysis. No abdominal pain, nausea, emesis, diarrhea, constipation, melena or hematochezia. No flank pain, dysuria, frequency or hematuria. No polyuria, polydipsia, polyphagia or blurred vision.   Lab work: Urinalysis showed proteinuria of 3030 mg/dL and many bacteria.  CBC showed white count 10.1, hemoglobin 11.7 g/dL and platelets 811.  BNP 659.6 pg/mL.  Lactic acid was 1.4 mmol/L.  CMP showed 727 and chloride 89 mmol/L.  Glucose was 107 mg/dL, the rest of the electrolytes were normal after calcium  correction, unremarkable hepatic and renal functions.  Imaging: Portable 1 view chest radiograph showing a new rounded right hilar opacity, which may represent pulmonary mass or lymphadenopathy.  Contrast enhanced CT recommended.  Bibasilar patchy interstitial opacity, which may represent atelectasis, edema, or infection.  Mark cardiomegaly.  Trace blunting of bilateral costophrenic angles, which may likely be trace pleural effusions.   ED course: Initial vital signs were  temperature 98.1 F, pulse 7010, respiration 19, BP 187/138 mmHg O2 sat 90% on room air.  The patient received furosemide 40 mg IVP and labetalol  10 mg IVP.  Review of Systems: As mentioned in the history of present illness. All other systems reviewed and are negative. 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/2009   RESTLESS LEG SYNDROME 10/01/2008   Stroke (HCC)    TACHYCARDIA 08/27/2008   TREMOR 11/21/2007   TRIGGER FINGER 10/30/2009   Past Surgical History:  Procedure Laterality Date   BELPHAROPTOSIS REPAIR     CATARACT EXTRACTION, BILATERAL     COLONOSCOPY     Social History:  reports that she has never smoked. She has never used smokeless tobacco. She reports that she does not drink alcohol and does not use drugs.  No Known Allergies  Family History  Problem Relation Age of Onset   Thyroid  disease Neg Hx    Colon cancer Neg Hx    Colon polyps Neg Hx    Rectal cancer Neg Hx    Stomach cancer Neg Hx     Prior to Admission medications   Medication Sig Start Date End Date Taking? Authorizing Provider  acetaminophen  (TYLENOL ) 325 MG tablet Take 1-2 tablets (325-650 mg total) by mouth every 4 (four) hours as needed for mild pain. 07/21/18   Love, Renay Carota, PA-C  acetaminophen  (TYLENOL ) 500 MG tablet  Take 1,000 mg by mouth at bedtime. May also take 2 tablets (1000mg ) every 6 hours as needed for pain    [provider]  alendronate  (FOSAMAX ) 70 MG tablet TAKE 1 TABLET BY MOUTH ONCE A WEEK .  TAKE  WITH  A  FULL  GLASS  OF  WATER  ON  AN  EMPTY  STOMACH Patient taking differently: Take 70 mg by mouth once a week. Every Saturday 08/22/18    Gwyndolyn Lerner, MD  aspirin  EC 81 MG tablet Take 81 mg by mouth daily.    [provider]  atorvastatin  (LIPITOR ) 80 MG tablet Take 1 tablet (80 mg total) by mouth daily at 6 PM. 08/30/18   Jhonny Moss, MD  camphor-menthol [facility]) lotion Apply 1 application topically at bedtime as needed for itching. Apply to both forearms and lower legs    [provider]  cholecalciferol (VITAMIN D ) 1000 UNITS tablet Take 1,000 Units by mouth daily.    [provider]  clonazePAM  (KLONOPIN ) 1 MG tablet Take 0.5 mg by mouth 2 (two) times daily as needed for anxiety.    [provider]  clopidogrel  (PLAVIX ) 75 MG tablet Take 1 tablet (75 mg total) by mouth daily. 08/30/18   Jhonny Moss, MD  gabapentin  (NEURONTIN ) 300 MG capsule Take 1 capsule (300 mg total) by mouth at bedtime. 10/06/21   Jhonny Moss, MD  levETIRAcetam  (KEPPRA ) 500 MG tablet Take 1 tablet (500 mg total) by mouth 2 (two) times daily. 08/30/18   Jhonny Moss, MD  levothyroxine  (SYNTHROID ) 50 MCG tablet Take 50 mcg by mouth daily before breakfast.    [provider]  loratadine  (CLARITIN ) 10 MG tablet Take 1 tablet (10 mg total) by mouth daily as needed for allergies. 07/26/18   Love, Renay Carota, PA-C  metFORMIN  (GLUCOPHAGE -XR) 500 MG 24 hr tablet Take 1 tablet (500 mg total) by mouth daily with breakfast. 09/16/18   Gwyndolyn Lerner, MD  Multiple Vitamin (MULTIVITAMIN) tablet Take 1 tablet by mouth daily.    [provider]  Propylene Glycol (SYSTANE BALANCE OP) Place 1 drop into both eyes 3 (three) times daily.    [provider]  rOPINIRole (REQUIP) 2 MG tablet Take 2 mg by mouth at bedtime.    [provider]  sertraline (ZOLOFT) 25 MG tablet Take 25 mg by mouth daily.    [provider]  triamcinolone  cream (KENALOG ) 0.1 % APPLY  CREAM EXTERNALLY THREE TIMES DAILY AS NEEDED FOR ITCHING Patient taking differently: APPLY  CREAM EXTERNALLY THREE TIMES DAILY AS NEEDED  FOR ITCHING 07/26/18   Zelda Hickman, [place]    Physical Exam: Vitals:   04/24/24 1104 04/24/24 1400 04/24/24 1401  BP: (!) 187/138 (!) 168/103 (!) 175/120  Pulse: (!) 110 95   Resp: 19 16   Temp: 98.1 F (36.7 C) 98.5 F (36.9 C)   TempSrc:  Oral   SpO2: 90% 96%    Physical Exam Vitals and nursing note reviewed.  Constitutional:      General: She is awake.     Appearance: Normal appearance. She is ill-appearing.  HENT:     Head: Normocephalic.     Ears:     Comments: Bilateral deafness.  Needs sign language.    Nose: No rhinorrhea.     Mouth/Throat:     Mouth: Mucous membranes are moist.  Eyes:     General: No scleral icterus.    Pupils: Pupils are equal, round, and  reactive to light.  Neck:     Vascular: No JVD.  Cardiovascular:     Rate and Rhythm: Normal rate and regular rhythm.     Heart sounds: S1 normal and S2 normal.  Pulmonary:     Breath sounds: Rhonchi and rales present. No wheezing.  Abdominal:     General: Bowel sounds are normal. There is no distension.     Palpations: Abdomen is soft.     Tenderness: There is no abdominal tenderness. There is no right CVA tenderness, left CVA tenderness or guarding.  Musculoskeletal:     Cervical back: Neck supple.     Right lower leg: Edema present.     Left lower leg: Edema present.  Skin:    General: Skin is warm and dry.  Neurological:     General: No focal deficit present.     Mental Status: She is alert and oriented to person, place, and time.  Psychiatric:        Mood and Affect: Mood normal.        Behavior: Behavior normal. Behavior is cooperative.     Data Reviewed:  Results are pending, will review when available.  EKG: Vent. rate 92 BPM PR interval 189 ms QRS duration 129 ms QT/QTcB 398/493 ms P-R-T axes 32 194 66 Sinus rhythm Nonspecific intraventricular conduction delay Inferior infarct, acute (RCA) Extensive anterior infarct, old Probable RV involvement, suggest recording right  precordial leads Baseline wander in lead(s) I aVL  Assessment and Plan: Principal Problem:   Hyponatremia Secondary to:   Volume overload With imaging showing:   Cardiomegaly  Observation/telemetry. Supplemental oxygen as needed. Sodium and fluid restriction. Continue furosemide 40 mg IVP twice daily. Monitor daily weights, intake and output. Monitor renal function electrolytes. Check echocardiogram. Will request cardiology evaluation tomorrow.  Active Problems:   Hypothyroidism Continue levothyroxine  75 mcg p.o. daily.    Dyslipidemia Continue atorvastatin  80 mg p.o. daily.    RESTLESS LEG SYNDROME Continue ropinirole 2 mg p.o. bedtime.    Essential hypertension Received labetalol  and furosemide in the ED. Started carvedilol 3.125 mg p.o. twice daily. Labetalol  10 mg IVP every 2 hours as needed. Will also add hydralazine  25 mg every 6 hours as needed.    Normocytic anemia Monitor hematocrit hemoglobin.    Deaf Supportive care.    Diabetes mellitus type 2 in nonobese (HCC) Continue metformin  500 mg p.o. daily. Carbohydrate modified diet. CBG monitoring with RI SS. Check hemoglobin A1c.    Asymptomatic bacteriuria Denied dysuria or flank pain.     Advance Care Planning:   Code Status: Full Code   Consults: Message sent to cardiology to consult in the morning.  Family Communication:   Severity of Illness: The appropriate patient status for this patient is OBSERVATION. Observation status is judged to be reasonable and necessary in order to provide the required intensity of service to ensure the patient's safety. The patient's presenting symptoms, physical exam findings, and initial radiographic and laboratory data in the context of their medical condition is felt to place them at decreased risk for further clinical deterioration. Furthermore, it is anticipated that the patient will be medically stable for discharge from the hospital within 2 midnights of  admission.   Author: Danice Dural, MD 04/24/2024 4:58 PM  For on call review [email or web address].   This document was prepared using Dragon voice recognition software and may contain some unintended transcription errors.

## 2024-04-24 NOTE — Progress Notes (Signed)
 VASCULAR LAB    Bilateral lower extremity venous duplex has been performed.  See CV proc for preliminary results.   Jahdai Padovano, RVT 04/24/2024, 4:57 PM

## 2024-04-24 NOTE — ED Provider Triage Note (Signed)
 Emergency Medicine Provider Triage Evaluation Note Here with ASL interpreter Laura [name] , a 83 y.o. female  was evaluated in triage.  Pt complains of legs intermittent tingling/pain, onset 2 days. No upper extremity symptoms. No history of DM although last A1C 7. Medical POA looked at legs today, called PCP who recommended ER. Legs swelling, warm to the touch, with SHOB. Long time [facility], worse lately.   Review of Systems  Positive:  Negative: Normal PO intake, no fevers  Physical Exam  BP (!) 187/138   Pulse (!) 110   Temp 98.1 F (36.7 C)   Resp 19   SpO2 90%  Gen:   Awake, no distress   Resp:  Normal effort  MSK:   Moves extremities without difficulty  Other:  Pitting edema bilaterally, mild redness, ulcer to anterior left lower leg  Medical Decision Making  Medically screening exam initiated at 12:11 PM.  Appropriate orders placed.  Laura [name] was informed that the remainder of the evaluation will be completed by another provider, this initial triage assessment does not replace that evaluation, and the importance of remaining in the ED until their evaluation is complete.     Laura Eisenmenger, PA-C 04/24/24 1216

## 2024-04-24 NOTE — ED Triage Notes (Signed)
 PT arrives via POV with her Medical POA. Currently awaiting in house interpretor for sign language. POA currently interpreting for patient. Pt reports redness, swelling, and pain to lower legs for the past 2 days. Pt is AxOx4. Denies other associated symptoms.

## 2024-04-24 NOTE — ED Notes (Signed)
 ED TO INPATIENT HANDOFF REPORT  Name/Age/Gender Laura [name] 83 y.o. female  Code Status    Code Status Orders  (From admission, onward)           Start     Ordered   04/24/24 1746  Full code  Continuous       Question:  By:  Answer:  Consent: discussion documented in EHR   04/24/24 1747           Code Status History     Date Active Date Inactive Code Status Order ID Comments User Context   07/19/2018 1505 07/26/2018 1438 Full Code [id number]  Jairo Mayer Inpatient   07/14/2018 2317 07/19/2018 1447 Full Code [id number]  Dea Evert, DO ED   04/07/2017 1043 04/13/2017 2247 DNR [id number]  Chaneta Comer, RN Inpatient   04/06/2017 1602 04/07/2017 1043 Full Code [id number]  Thomasena Fleming, NP ED      Advance Directive Documentation    Flowsheet Row Most Recent Value  Type of Advance Directive Healthcare Power of Attorney  Pre-existing out of facility DNR order (yellow form or pink MOST form) --  "MOST" Form in Place? --       Home/SNF/Other Home  Chief Complaint Volume overload [E87.70]  Level of Care/Admitting Diagnosis ED Disposition     ED Disposition  Admit   Condition  --   Comment  Hospital Area: [facility] [[id number]]  Level of Care: Progressive [102]  Admit to Progressive based on following criteria: CARDIOVASCULAR & THORACIC of moderate stability with acute coronary syndrome symptoms/low risk myocardial infarction/hypertensive urgency/arrhythmias/heart failure potentially compromising stability and stable post cardiovascular intervention patients.  May place patient in observation at [facility] or [facility] if equivalent level of care is available:: No  Covid Evaluation: Asymptomatic - no recent exposure (last 10 days) testing not required  Diagnosis: Volume overload [629528]  Admitting Physician: Danice Dural [[id number]]  Attending Physician: Danice Dural [[id number]]          Medical History Past  Medical History:  Diagnosis Date   Anxiety state, unspecified 10/10/2007   Arthritis    ASYMPTOMATIC POSTMENOPAUSAL STATUS 04/15/2009   Cataract    removed both eyes    Deaf    Dermatitis    Headache(784.0) 08/27/2008   HYPERLIPIDEMIA 06/22/2007   rx refused   HYPERTENSION 05/07/2008   HYPOTHYROIDISM 04/15/2009   Mild mental retardation    PARONYCHIA, RIGHT GREAT TOE 12/23/2009   RESTLESS LEG SYNDROME 10/01/2008   Stroke (HCC)    TACHYCARDIA 08/27/2008   TREMOR 11/21/2007   TRIGGER FINGER 10/30/2009    Allergies No Known Allergies  IV Location/Drains/Wounds Patient Lines/Drains/Airways Status     Active Line/Drains/Airways     Name Placement date Placement time Site Days   Peripheral IV 04/24/24 20 G Left Antecubital 04/24/24  1735  Antecubital  less than 1   Wound / Incision (Open or Dehisced) 04/07/17 Other (Comment) Head Right 04/07/17  0800  Head  2574            Labs/Imaging Results for orders placed or performed during the hospital encounter of 04/24/24 (from the past 48 hours)  Urinalysis, w/ Reflex to Culture (Infection Suspected) -Urine, Clean Catch     Status: Abnormal   Collection Time: 04/24/24 11:36 AM  Result Value Ref Range   Specimen Source URINE, CLEAN CATCH    Color, Urine YELLOW YELLOW   APPearance CLEAR CLEAR   Specific Gravity, Urine  1.005 1.005 - 1.030   pH 7.0 5.0 - 8.0   Glucose, UA NEGATIVE NEGATIVE mg/dL   Hgb urine dipstick NEGATIVE NEGATIVE   Bilirubin Urine NEGATIVE NEGATIVE   Ketones, ur NEGATIVE NEGATIVE mg/dL   Protein, ur 30 (A) NEGATIVE mg/dL   Nitrite NEGATIVE NEGATIVE   Leukocytes,Ua NEGATIVE NEGATIVE   RBC / HPF 0-5 0 - 5 RBC/hpf   WBC, UA 0-5 0 - 5 WBC/hpf    Comment:        Reflex urine culture not performed if WBC <=10, OR if Squamous epithelial cells >5. If Squamous epithelial cells >5 suggest recollection.    Bacteria, UA MANY (A) NONE SEEN   Squamous Epithelial / HPF 0-5 0 - 5 /HPF    Comment: Performed at [facility], [address] [place], [place] [address]  Comprehensive metabolic panel     Status: Abnormal   Collection Time: 04/24/24 11:57 AM  Result Value Ref Range   Sodium 127 (L) 135 - 145 mmol/L   Potassium 3.9 3.5 - 5.1 mmol/L   Chloride 89 (L) 98 - 111 mmol/L   CO2 26 22 - 32 mmol/L   Glucose, Bld 107 (H) 70 - 99 mg/dL    Comment: Glucose reference range applies only to samples taken after fasting for at least 8 hours.   BUN 17 8 - 23 mg/dL   Creatinine, Ser 6.29 0.44 - 1.00 mg/dL   Calcium  8.7 (L) 8.9 - 10.3 mg/dL   Total Protein 7.2 6.5 - 8.1 g/dL   Albumin 3.7 3.5 - 5.0 g/dL   AST 27 15 - 41 U/L   ALT 30 0 - 44 U/L   Alkaline Phosphatase 83 38 - 126 U/L   Total Bilirubin 1.0 0.0 - 1.2 mg/dL   GFR, Estimated >52 >84 mL/min    Comment: (NOTE) Calculated using the CKD-EPI Creatinine Equation (2021)    Anion gap 12 5 - 15    Comment: Performed at [facility], [address], [place], [place] [address]  CBC with Differential     Status: Abnormal   Collection Time: 04/24/24 11:57 AM  Result Value Ref Range   WBC 10.1 4.0 - 10.5 K/uL   RBC 4.69 3.87 - 5.11 MIL/uL   Hemoglobin 11.7 (L) 12.0 - 15.0 g/dL   HCT 01.0 27.2 - 53.6 %   MCV 81.9 80.0 - 100.0 fL   MCH 24.9 (L) 26.0 - 34.0 pg   MCHC 30.5 30.0 - 36.0 g/dL   RDW 64.4 03.4 - 74.2 %   Platelets 381 150 - 400 K/uL   nRBC 0.0 0.0 - 0.2 %   Neutrophils Relative % 78 %   Neutro Abs 8.0 (H) 1.7 - 7.7 K/uL   Lymphocytes Relative 9 %   Lymphs Abs 0.9 0.7 - 4.0 K/uL   Monocytes Relative 10 %   Monocytes Absolute 1.0 0.1 - 1.0 K/uL   Eosinophils Relative 1 %   Eosinophils Absolute 0.1 0.0 - 0.5 K/uL   Basophils Relative 1 %   Basophils Absolute 0.1 0.0 - 0.1 K/uL   Immature Granulocytes 1 %   Abs Immature Granulocytes 0.05 0.00 - 0.07 K/uL    Comment: Performed at [facility], [address], [place], [place] [address]  Brain natriuretic peptide     Status:  Abnormal   Collection Time: 04/24/24 11:57 AM  Result Value Ref Range   B Natriuretic Peptide 659.6 (H) 0.0 - 100.0 pg/mL  Comment: Performed at [facility], [address] [place], [place] [address]  I-Stat Lactic Acid, ED     Status: None   Collection Time: 04/24/24 12:02 PM  Result Value Ref Range   Lactic Acid, Venous 1.4 0.5 - 1.9 mmol/L   VAS US  LOWER EXTREMITY VENOUS (DVT) (ONLY [facility] & [facility]) Result Date: 04/24/2024  Lower Venous DVT Study Patient Name:  ADYN HOES  Date of Exam:   04/24/2024 Medical Rec #: [id number]        Accession #:    [id number] Date of Birth: 1940/12/23        Patient Gender: F Patient Age:   20 years Exam Location:  [facility] Procedure:      VAS US  LOWER EXTREMITY VENOUS (DVT) Referring Phys: Rice Chamorro MURPHY --------------------------------------------------------------------------------  Indications: Edema, Erythema, and SOB.  Limitations: Body habitus, poor ultrasound/tissue interface and Pitting edema, patient unable to tolerate compression maneuvers or lie flat. Oxygen level dropping, patient instructed to breathe with pursed lips. Comparison Study: Prior negative bilateral LEV done 06/27/2018 Performing Technologist: Carleene Chase RVS  Examination Guidelines: A complete evaluation includes B-mode imaging, spectral Doppler, color Doppler, and power Doppler as needed of all accessible portions of each vessel. Bilateral testing is considered an integral part of a complete examination. Limited examinations for reoccurring indications may be performed as noted. The reflux portion of the exam is performed with the patient in reverse Trendelenburg.  +---------+---------------+---------+-----------+---------------+--------------+ RIGHT    CompressibilityPhasicitySpontaneityProperties     Thrombus Aging +---------+---------------+---------+-----------+---------------+--------------+ CFV      Full           Yes      No         pulsatile                                                                  waveforms                     +---------+---------------+---------+-----------+---------------+--------------+ SFJ      Full                                                             +---------+---------------+---------+-----------+---------------+--------------+ FV Prox  Full           Yes      No         pulsatile                                                                 waveforms                     +---------+---------------+---------+-----------+---------------+--------------+ FV Mid   Full                                                             +---------+---------------+---------+-----------+---------------+--------------+  FV DistalFull                                                             +---------+---------------+---------+-----------+---------------+--------------+ PFV      Full                                                             +---------+---------------+---------+-----------+---------------+--------------+ POP                     Yes      No         pulsatile                                                                 waveforms                     +---------+---------------+---------+-----------+---------------+--------------+ PTV                                                        Not well                                                                  visualized     +---------+---------------+---------+-----------+---------------+--------------+ PERO                                                       Not well                                                                  visualized     +---------+---------------+---------+-----------+---------------+--------------+   +---------+---------------+---------+-----------+---------------+--------------+ LEFT     CompressibilityPhasicitySpontaneityProperties      Thrombus Aging +---------+---------------+---------+-----------+---------------+--------------+ CFV      Full                                                             +---------+---------------+---------+-----------+---------------+--------------+ SFJ  Full                                                             +---------+---------------+---------+-----------+---------------+--------------+ FV Prox  Full           Yes      No         pulsatile                                                                 waveforms                     +---------+---------------+---------+-----------+---------------+--------------+ FV Mid   Full                                                             +---------+---------------+---------+-----------+---------------+--------------+ FV DistalFull                                                             +---------+---------------+---------+-----------+---------------+--------------+ PFV      Full                                                             +---------+---------------+---------+-----------+---------------+--------------+ POP      Full                                                             +---------+---------------+---------+-----------+---------------+--------------+ PTV                                                        Not well                                                                  visualized     +---------+---------------+---------+-----------+---------------+--------------+ PERO  Not well                                                                  visualized     +---------+---------------+---------+-----------+---------------+--------------+     Summary: RIGHT: - There is no evidence of deep vein thrombosis in the lower extremity.  - No cystic structure found in the popliteal fossa.  LEFT:  - There is no evidence of deep vein thrombosis in the lower extremity.  - No cystic structure found in the popliteal fossa.  *See table(s) above for measurements and observations.    Preliminary    DG Chest Port 1 View Result Date: 04/24/2024 CLINICAL DATA:  Shortness of breath EXAM: PORTABLE CHEST 1 VIEW COMPARISON:  Chest radiograph dated 07/29/2018 FINDINGS: Low lung volumes with bronchovascular crowding. New rounded right hilar opacity. Bibasilar patchy and interstitial opacities. Trace blunting of bilateral costophrenic angles. No pneumothorax. Markedly enlarged cardiomediastinal silhouette. Bilateral healing and old rib fractures. IMPRESSION: 1. New rounded right hilar opacity, which may represent a pulmonary mass or lymphadenopathy. Recommend further evaluation with contrast-enhanced chest CT. 2. Bibasilar patchy and interstitial opacities, which may represent atelectasis, edema, or infection. 3. Markedly enlarged cardiomediastinal silhouette. 4. Trace blunting of bilateral costophrenic angles, which may represent trace pleural effusions. Electronically Signed   By: Limin  Xu M.D.   On: 04/24/2024 13:10    Pending Labs Unresulted Labs (From admission, onward)     Start     Ordered   04/25/24 0500  Comprehensive metabolic panel  Tomorrow morning,   R        04/24/24 1747   04/25/24 0500  CBC  Tomorrow morning,   R        04/24/24 1747   04/24/24 1703  Hemoglobin A1c  Add-on,   AD       Comments: To assess prior glycemic control    04/24/24 1703            Vitals/Pain Today's Vitals   04/24/24 1400 04/24/24 1401 04/24/24 1806 04/24/24 1928  BP: (!) 168/103 (!) 175/120 (!) 181/111 (!) 148/112  Pulse: 95  81 97  Resp: 16  (!) 23 20  Temp: 98.5 F (36.9 C)  97.7 F (36.5 C) (!) 97.4 F (36.3 C)  TempSrc: Oral  Oral   SpO2: 96%  96% 94%  PainSc:        Isolation Precautions No active isolations  Medications Medications  insulin aspart (novoLOG) injection 0-9 Units (has no  administration in time range)  carvedilol (COREG) tablet 3.125 mg (3.125 mg Oral Given 04/24/24 1823)  enoxaparin  (LOVENOX ) injection 40 mg (has no administration in time range)  acetaminophen  (TYLENOL ) tablet 650 mg (has no administration in time range)    Or  acetaminophen  (TYLENOL ) suppository 650 mg (has no administration in time range)  ondansetron  (ZOFRAN ) tablet 4 mg (has no administration in time range)    Or  ondansetron  (ZOFRAN ) injection 4 mg (has no administration in time range)  hydrALAZINE  (APRESOLINE ) tablet 25 mg (has no administration in time range)  aspirin  chewable tablet 81 mg (has no administration in time range)  atorvastatin  (LIPITOR ) tablet 80 mg (has no administration in time range)  cholecalciferol (VITAMIN D3) 25 MCG (1000 UNIT) tablet 1,000 Units (has no administration in  time range)  clopidogrel  (PLAVIX ) tablet 75 mg (has no administration in time range)  levETIRAcetam  (KEPPRA ) tablet 500 mg (has no administration in time range)  levothyroxine  (SYNTHROID ) tablet 75 mcg (has no administration in time range)  metFORMIN  (GLUCOPHAGE -XR) 24 hr tablet 1,000 mg (has no administration in time range)  loratadine  (CLARITIN ) tablet 10 mg (has no administration in time range)  rOPINIRole (REQUIP) tablet 2 mg (has no administration in time range)  Polyethyl Glycol-Propyl Glycol 0.4-0.3 % SOLN 1 drop (has no administration in time range)  fluticasone (FLONASE) 50 MCG/ACT nasal spray 2 spray (has no administration in time range)  labetalol  (NORMODYNE ) injection 10 mg (has no administration in time range)  furosemide (LASIX) injection 40 mg (40 mg Intravenous Given 04/24/24 1741)  labetalol  (NORMODYNE ) injection 10 mg (10 mg Intravenous Given 04/24/24 1740)  iohexol (OMNIPAQUE) 300 MG/ML solution 75 mL (75 mLs Intravenous Contrast Given 04/24/24 1916)    Mobility walks

## 2024-04-25 ENCOUNTER — Other Ambulatory Visit (HOSPITAL_COMMUNITY)

## 2024-04-25 DIAGNOSIS — I5021 Acute systolic (congestive) heart failure: Secondary | ICD-10-CM | POA: Diagnosis not present

## 2024-04-25 DIAGNOSIS — E1122 Type 2 diabetes mellitus with diabetic chronic kidney disease: Secondary | ICD-10-CM | POA: Diagnosis not present

## 2024-04-25 DIAGNOSIS — I130 Hypertensive heart and chronic kidney disease with heart failure and stage 1 through stage 4 chronic kidney disease, or unspecified chronic kidney disease: Secondary | ICD-10-CM | POA: Diagnosis not present

## 2024-04-25 DIAGNOSIS — I5033 Acute on chronic diastolic (congestive) heart failure: Secondary | ICD-10-CM | POA: Diagnosis not present

## 2024-04-25 DIAGNOSIS — I5031 Acute diastolic (congestive) heart failure: Secondary | ICD-10-CM | POA: Diagnosis not present

## 2024-04-25 DIAGNOSIS — E871 Hypo-osmolality and hyponatremia: Secondary | ICD-10-CM

## 2024-04-25 DIAGNOSIS — I447 Left bundle-branch block, unspecified: Secondary | ICD-10-CM | POA: Diagnosis not present

## 2024-04-25 DIAGNOSIS — E8770 Fluid overload, unspecified: Secondary | ICD-10-CM | POA: Diagnosis not present

## 2024-04-25 DIAGNOSIS — H9193 Unspecified hearing loss, bilateral: Secondary | ICD-10-CM | POA: Diagnosis not present

## 2024-04-25 DIAGNOSIS — I10 Essential (primary) hypertension: Secondary | ICD-10-CM | POA: Diagnosis not present

## 2024-04-25 DIAGNOSIS — G40909 Epilepsy, unspecified, not intractable, without status epilepticus: Secondary | ICD-10-CM | POA: Diagnosis not present

## 2024-04-25 DIAGNOSIS — F70 Mild intellectual disabilities: Secondary | ICD-10-CM | POA: Diagnosis not present

## 2024-04-25 DIAGNOSIS — N1831 Chronic kidney disease, stage 3a: Secondary | ICD-10-CM | POA: Diagnosis not present

## 2024-04-25 DIAGNOSIS — L03116 Cellulitis of left lower limb: Secondary | ICD-10-CM | POA: Diagnosis not present

## 2024-04-25 DIAGNOSIS — Z7983 Long term (current) use of bisphosphonates: Secondary | ICD-10-CM | POA: Diagnosis not present

## 2024-04-25 DIAGNOSIS — L03119 Cellulitis of unspecified part of limb: Secondary | ICD-10-CM

## 2024-04-25 DIAGNOSIS — Z66 Do not resuscitate: Secondary | ICD-10-CM | POA: Diagnosis not present

## 2024-04-25 DIAGNOSIS — I517 Cardiomegaly: Secondary | ICD-10-CM | POA: Diagnosis not present

## 2024-04-25 DIAGNOSIS — S2241XA Multiple fractures of ribs, right side, initial encounter for closed fracture: Secondary | ICD-10-CM | POA: Diagnosis not present

## 2024-04-25 DIAGNOSIS — I420 Dilated cardiomyopathy: Secondary | ICD-10-CM | POA: Diagnosis not present

## 2024-04-25 DIAGNOSIS — Z515 Encounter for palliative care: Secondary | ICD-10-CM | POA: Diagnosis not present

## 2024-04-25 DIAGNOSIS — G2581 Restless legs syndrome: Secondary | ICD-10-CM | POA: Diagnosis not present

## 2024-04-25 DIAGNOSIS — R0602 Shortness of breath: Secondary | ICD-10-CM | POA: Diagnosis not present

## 2024-04-25 DIAGNOSIS — E782 Mixed hyperlipidemia: Secondary | ICD-10-CM | POA: Diagnosis not present

## 2024-04-25 DIAGNOSIS — I429 Cardiomyopathy, unspecified: Secondary | ICD-10-CM | POA: Diagnosis not present

## 2024-04-25 DIAGNOSIS — I161 Hypertensive emergency: Secondary | ICD-10-CM | POA: Diagnosis not present

## 2024-04-25 DIAGNOSIS — L03115 Cellulitis of right lower limb: Secondary | ICD-10-CM | POA: Diagnosis not present

## 2024-04-25 DIAGNOSIS — G9341 Metabolic encephalopathy: Secondary | ICD-10-CM | POA: Diagnosis not present

## 2024-04-25 DIAGNOSIS — F840 Autistic disorder: Secondary | ICD-10-CM | POA: Diagnosis not present

## 2024-04-25 DIAGNOSIS — F411 Generalized anxiety disorder: Secondary | ICD-10-CM | POA: Diagnosis not present

## 2024-04-25 DIAGNOSIS — I5043 Acute on chronic combined systolic (congestive) and diastolic (congestive) heart failure: Secondary | ICD-10-CM | POA: Diagnosis not present

## 2024-04-25 DIAGNOSIS — J9612 Chronic respiratory failure with hypercapnia: Secondary | ICD-10-CM | POA: Diagnosis not present

## 2024-04-25 DIAGNOSIS — Z7189 Other specified counseling: Secondary | ICD-10-CM | POA: Diagnosis not present

## 2024-04-25 DIAGNOSIS — I5023 Acute on chronic systolic (congestive) heart failure: Secondary | ICD-10-CM | POA: Diagnosis not present

## 2024-04-25 DIAGNOSIS — R4589 Other symptoms and signs involving emotional state: Secondary | ICD-10-CM | POA: Diagnosis not present

## 2024-04-25 DIAGNOSIS — E7800 Pure hypercholesterolemia, unspecified: Secondary | ICD-10-CM | POA: Diagnosis not present

## 2024-04-25 DIAGNOSIS — E039 Hypothyroidism, unspecified: Secondary | ICD-10-CM | POA: Diagnosis not present

## 2024-04-25 DIAGNOSIS — I509 Heart failure, unspecified: Secondary | ICD-10-CM | POA: Diagnosis not present

## 2024-04-25 DIAGNOSIS — I5020 Unspecified systolic (congestive) heart failure: Secondary | ICD-10-CM | POA: Diagnosis not present

## 2024-04-25 DIAGNOSIS — I455 Other specified heart block: Secondary | ICD-10-CM | POA: Diagnosis not present

## 2024-04-25 DIAGNOSIS — E861 Hypovolemia: Secondary | ICD-10-CM | POA: Diagnosis not present

## 2024-04-25 DIAGNOSIS — D631 Anemia in chronic kidney disease: Secondary | ICD-10-CM | POA: Diagnosis not present

## 2024-04-25 DIAGNOSIS — I77819 Aortic ectasia, unspecified site: Secondary | ICD-10-CM | POA: Diagnosis not present

## 2024-04-25 LAB — CBC
HCT: 37.3 % (ref 36.0–46.0)
Hemoglobin: 11.3 g/dL — ABNORMAL LOW (ref 12.0–15.0)
MCH: 25 pg — ABNORMAL LOW (ref 26.0–34.0)
MCHC: 30.3 g/dL (ref 30.0–36.0)
MCV: 82.5 fL (ref 80.0–100.0)
Platelets: 364 10*3/uL (ref 150–400)
RBC: 4.52 MIL/uL (ref 3.87–5.11)
RDW: 14.9 % (ref 11.5–15.5)
WBC: 7.9 10*3/uL (ref 4.0–10.5)
nRBC: 0 % (ref 0.0–0.2)

## 2024-04-25 LAB — COMPREHENSIVE METABOLIC PANEL WITH GFR
ALT: 27 U/L (ref 0–44)
AST: 22 U/L (ref 15–41)
Albumin: 3.5 g/dL (ref 3.5–5.0)
Alkaline Phosphatase: 78 U/L (ref 38–126)
Anion gap: 9 (ref 5–15)
BUN: 15 mg/dL (ref 8–23)
CO2: 32 mmol/L (ref 22–32)
Calcium: 8.5 mg/dL — ABNORMAL LOW (ref 8.9–10.3)
Chloride: 87 mmol/L — ABNORMAL LOW (ref 98–111)
Creatinine, Ser: 0.71 mg/dL (ref 0.44–1.00)
GFR, Estimated: >60 mL/min (ref >60)
Glucose, Bld: 128 mg/dL — ABNORMAL HIGH (ref 70–99)
Potassium: 3.6 mmol/L (ref 3.5–5.1)
Sodium: 128 mmol/L — ABNORMAL LOW (ref 135–145)
Total Bilirubin: 0.9 mg/dL (ref 0.0–1.2)
Total Protein: 6.8 g/dL (ref 6.5–8.1)

## 2024-04-25 LAB — GLUCOSE, CAPILLARY
Glucose-Capillary: 99 mg/dL (ref 70–99)
Glucose-Capillary: 188 mg/dL — ABNORMAL HIGH (ref 70–99)
Glucose-Capillary: 148 mg/dL — ABNORMAL HIGH (ref 70–99)
Glucose-Capillary: 144 mg/dL — ABNORMAL HIGH (ref 70–99)

## 2024-04-25 LAB — TSH: TSH: 7.991 u[IU]/mL — ABNORMAL HIGH (ref 0.350–4.500)

## 2024-04-25 MED ORDER — FUROSEMIDE 10 MG/ML IJ SOLN
40.0000 mg | Freq: Two times a day (BID) | INTRAMUSCULAR | Status: DC
  Administered 2024-04-25 – 2024-04-28 (×8): 40 mg via INTRAVENOUS
  Filled 2024-04-25 (×8): qty 4

## 2024-04-25 MED ORDER — ORAL CARE MOUTH RINSE: 15.0000 mL | OROMUCOSAL | Status: DC | PRN

## 2024-04-25 MED ORDER — POTASSIUM CHLORIDE CRYS ER 20 MEQ PO TBCR
40.0000 meq | EXTENDED_RELEASE_TABLET | Freq: Every day | ORAL | Status: DC
  Administered 2024-04-25 – 2024-04-29 (×5): 40 meq via ORAL
  Filled 2024-04-25 (×6): qty 2

## 2024-04-25 MED ORDER — SENNOSIDES-DOCUSATE SODIUM 8.6-50 MG PO TABS
1.0000 | ORAL_TABLET | Freq: Two times a day (BID) | ORAL | Status: DC
  Administered 2024-04-25 – 2024-05-01 (×12): 1 via ORAL
  Filled 2024-04-25 (×12): qty 1

## 2024-04-25 MED ORDER — CARVEDILOL 6.25 MG PO TABS
6.2500 mg | ORAL_TABLET | Freq: Two times a day (BID) | ORAL | Status: DC
  Administered 2024-04-25 – 2024-04-29 (×8): 6.25 mg via ORAL
  Filled 2024-04-25 (×9): qty 1

## 2024-04-25 MED ORDER — DOXYCYCLINE HYCLATE 100 MG PO TABS
100.0000 mg | ORAL_TABLET | Freq: Two times a day (BID) | ORAL | Status: AC
  Administered 2024-04-25 – 2024-04-29 (×10): 100 mg via ORAL
  Filled 2024-04-25 (×10): qty 1

## 2024-04-25 MED ORDER — CLONAZEPAM 0.125 MG PO TBDP
0.2500 mg | ORAL_TABLET | Freq: Once | ORAL | Status: AC | PRN
  Administered 2024-04-25: 0.25 mg via ORAL
  Filled 2024-04-25: qty 2

## 2024-04-25 MED ORDER — LABETALOL HCL 5 MG/ML IV SOLN
10.0000 mg | INTRAVENOUS | Status: DC | PRN
  Filled 2024-04-25: qty 4

## 2024-04-25 MED ORDER — POLYETHYLENE GLYCOL 3350 17 G PO PACK
17.0000 g | PACK | Freq: Every day | ORAL | Status: DC
  Administered 2024-04-25 – 2024-05-01 (×5): 17 g via ORAL
  Filled 2024-04-25 (×5): qty 1

## 2024-04-25 NOTE — Progress Notes (Addendum)
 PROGRESS NOTE   Laura [name]  UVO:[id number]    DOB: 03-19-1941    DOA: 04/24/2024  PCP: Helyn Lobstein, MD   I have briefly reviewed patients previous medical records in [facility].   Brief Hospital Course:  83 year old female, resident of retirement community, lives with caregiver/HCPOA, ambulates with the help of a walker, communicates by sign language, medical history significant for cognitive impairment/autism spectrum, generalized anxiety disorder, bilateral deafness, HLD, HTN, hypothyroid, RLS, stroke, seizure disorder, stage IIIa CKD, DM 2 presented to the ED on 6/2 due to complaints of lower extremity swelling, dyspnea on exertion, abdominal distention.  Limited history due to cognitive impairment and language/communication barrier on admission.  Admitted for suspected new onset acute CHF, started on IV Lasix, echo requested.  Cardiology consulted.   Assessment & Plan:   Acute on chronic diastolic CHF Presented with DOE, leg and abdominal swelling.  Clinically volume overloaded TTE on 07/15/2018 showed LVEF of 55-60% and grade 1 diastolic dysfunction. BNP 659.6. CT chest with contrast: Cardiac enlargement.  Mosaic attenuation pattern to the lungs may represent edema, air trapping or airspace infiltrates.  Clinically without pneumonia. Got a dose of IV Lasix 40 mg x 1 on 6/2.  Leg swelling better but still present and ongoing DOE.  Abdominal distention present.  PTA on Lasix 20 Mg daily. Intake and output normal weight completely documented Continue IV Lasix 40 mg every 12 hours Follow 2D echo results Cardiology consulted for assistance.  Bilateral leg cellulitis, left > right Patient has tendency to pick causing wounds. Per HCPOA, leg wounds were wheezing yesterday but have stopped oozing today Multiple scabs in different stages of healing on left leg, 1 of those lesions showed minimal dried purulence underneath. Concern for cellulitis.  Treat with oral doxycycline 100 Mg  twice daily x 5 days and monitor.  Decreasing edema should help Bilateral lower extremity venous Dopplers negative for DVT.  Hyponatremia May be related to volume overload. Serum sodium 127 > 128. Continue diuresis as noted above and follow daily BMP Check TSH (in 2019 was 12.31)  Normocytic anemia May be anemia of chronic disease.  Stable  Hypothyroidism Last known TSH in 2019: 12.31. Continue levothyroxine  75 mcg daily Check TSH.  Dyslipidemia Continue atorvastatin  80 mg daily  Essential hypertension Continue carvedilol 3.125 Mg twice daily and can be titrated up. Consider stopping newly started hydralazine  then switching to losartan 25 Mg daily.  Await cardiology input.  Type II DM A1c 8.5 on 04/24/2024 Hold metformin  while hospitalized SSI and adjust insulins as needed.  Consider low-dose long-acting insulin if consistently elevated >180.  Cognitive impairment/autism spectrum/bilateral deafness Supportive care. Mental status appears at baseline. Delirium precautions.  History of stroke Not sure if he is on both aspirin  81 Mg daily + Plavix  75 Mg daily for this indication.  Also on statins.  Seizure disorder Continue Keppra   RLS Continue Requip  Generalized anxiety disorder Does not appear to be on any medications PTA.  Monitor.  Asymptomatic bacteriuria   Body mass index is 27.5 kg/m.   DVT prophylaxis: enoxaparin  (LOVENOX ) injection 40 mg Start: 04/24/24 2200     Code Status: Full Code:  Family Communication: Caregiver/HCPOA at bedside Disposition:  Status is: Observation The patient will require care spanning > 2 midnights and should be moved to inpatient because: Decompensated CHF needing IV Lasix     Consultants:   Cardiology  Procedures:   None  Subjective:  Seen this morning along with patient's female RN and  patient's caregiver at bedside.  Per caregiver, patient had a restless night, due to DOE, not knowing that she has to stay in bed and  wanting to get up to urinate.  She feels leg swelling is better but still with abdominal distention and DOE.  Per nursing, dyspneic when moved from bed to chair.  Patient asking for [place] oxygen.  Sign language interpreter will be by later for any additional information.  Late morning at around 10:45, went back to patient's room when sign language interpreter was there and interviewed patient along with cardiology team.  No additional details.  Objective:   Vitals:   04/25/24 0103 04/25/24 0107 04/25/24 0540 04/25/24 0906  BP:  (!) 142/78 138/77 134/78  Pulse:  80 79 77  Resp:  19 18 19   Temp:  97.9 F (36.6 C) 97.8 F (36.6 C) 98.1 F (36.7 C)  TempSrc:  Oral Oral Oral  SpO2:  97% 94% 100%  Weight: 68.2 kg     Height: 5\' 2"  (1.575 m)       General exam: Elderly female, small built, moderately nourished sitting up comfortably in reclining chair without distress. Respiratory system: Slightly diminished breath sounds in the bases with occasional bibasal crackles.  Rest of lung fields clear to auscultation without wheezing or rhonchi.  No increased work of breathing. Cardiovascular system: S1 & S2 heard, RRR. No JVD, murmurs, rubs, gallops or clicks.  1+ bilateral leg edema.  Telemetry personally reviewed: Sinus rhythm. Gastrointestinal system: Abdomen is distended, soft and nontender. No organomegaly or masses felt. Normal bowel sounds heard. Central nervous system: Alert but orientation cannot be assessed.  Appears communicate with caregiver with sign language appropriately. No focal neurological deficits. Extremities: Symmetric 5 x 5 power. Skin: Multiple scabs/scars of ulcers in different stages of healing in lower extremities, more so on the left leg.  Some erythema surrounding these lesions again worse on the left leg.  Minimal warmth.  No tenderness, crepitus or active drainage.  One of the lesions on the left leg has minimal dried purulence underneath. Psychiatry: Judgement and insight  chronically impaired. Mood & affect appropriate.     Data Reviewed:   I have personally reviewed following labs and imaging studies   CBC: Recent Labs  Lab 04/24/24 1157 04/25/24 0552  WBC 10.1 7.9  NEUTROABS 8.0*  --   HGB 11.7* 11.3*  HCT 38.4 37.3  MCV 81.9 82.5  PLT 381 364    Basic Metabolic Panel: Recent Labs  Lab 04/24/24 1157 04/25/24 0552  NA 127* 128*  K 3.9 3.6  CL 89* 87*  CO2 26 32  GLUCOSE 107* 128*  BUN 17 15  CREATININE 0.85 0.71  CALCIUM  8.7* 8.5*    Liver Function Tests: Recent Labs  Lab 04/24/24 1157 04/25/24 0552  AST 27 22  ALT 30 27  ALKPHOS 83 78  BILITOT 1.0 0.9  PROT 7.2 6.8  ALBUMIN 3.7 3.5    CBG: Recent Labs  Lab 04/24/24 2102 04/25/24 0728  GLUCAP 147* 99    Microbiology Studies:  No results found for this or any previous visit (from the past 240 hours).  Radiology Studies:  CT CHEST W CONTRAST Result Date: 04/24/2024 CLINICAL DATA:  Abnormal chest radiograph. Redness, swelling, and pain to the lower legs. EXAM: CT CHEST WITH CONTRAST TECHNIQUE: Multidetector CT imaging of the chest was performed during intravenous contrast administration. RADIATION DOSE REDUCTION: This exam was performed according to the departmental dose-optimization program which includes automated exposure control,  adjustment of the mA and/or kV according to patient size and/or use of iterative reconstruction technique. CONTRAST:  75mL OMNIPAQUE IOHEXOL 300 MG/ML  SOLN COMPARISON:  Chest radiograph 04/24/2024 and 07/29/2018 FINDINGS: Cardiovascular: Cardiac enlargement. No pericardial effusions. Normal caliber thoracic aorta. No aortic dissection. Scattered calcification in the aorta and coronary arteries. Central pulmonary arteries are patent without evidence of significant pulmonary embolus. Mediastinum/Nodes: Esophagus is decompressed. No significant lymphadenopathy. Thyroid  gland is unremarkable. Prominent mediastinal fat likely accounts for right hilar  chest radiographic abnormality. Lungs/Pleura: Mosaic attenuation pattern to the lungs may be due to edema, airspace disease, or air trapping. Mild atelectasis or infiltration in the right middle lung. Upper Abdomen: No acute abnormalities. Musculoskeletal: Degenerative changes in the spine. No acute bony abnormalities. IMPRESSION: 1. Cardiac enlargement. 2. Mosaic attenuation pattern to the lungs may represent edema, air trapping, or airspace infiltrates. 3. Prominent mediastinal fat likely accounts for right hilar changes on prior chest radiograph. Electronically Signed   By: Boyce Byes M.D.   On: 04/24/2024 20:11   VAS US  LOWER EXTREMITY VENOUS (DVT) (ONLY [facility] & [facility]) Result Date: 04/24/2024  Lower Venous DVT Study Patient Name:  Laura [name]  Date of Exam:   04/24/2024 Medical Rec #: [id number]        Accession #:    [id number] Date of Birth: Dec 07, 1940        Patient Gender: F Patient Age:   47 years Exam Location[facility]  [facility] Procedure:      VAS US  LOWER EXTREMITY VENOUS (DVT) Referring Phys: Rice Chamorro MURPHY --------------------------------------------------------------------------------  Indications: Edema, Erythema, and SOB.  Limitations: Body habitus, poor ultrasound/tissue interface and Pitting edema, patient unable to tolerate compression maneuvers or lie flat. Oxygen level dropping, patient instructed to breathe with pursed lips. Comparison Study: Prior negative bilateral LEV done 06/27/2018 Performing Technologist: Carleene Chase RVS  Examination Guidelines: A complete evaluation includes B-mode imaging, spectral Doppler, color Doppler, and power Doppler as needed of all accessible portions of each vessel. Bilateral testing is considered an integral part of a complete examination. Limited examinations for reoccurring indications may be performed as noted. The reflux portion of the exam is performed with the patient in reverse Trendelenburg.   +---------+---------------+---------+-----------+---------------+--------------+ RIGHT    CompressibilityPhasicitySpontaneityProperties     Thrombus Aging +---------+---------------+---------+-----------+---------------+--------------+ CFV      Full           Yes      No         pulsatile                                                                 waveforms                     +---------+---------------+---------+-----------+---------------+--------------+ SFJ      Full                                                             +---------+---------------+---------+-----------+---------------+--------------+ FV Prox  Full           Yes      No  pulsatile                                                                 waveforms                     +---------+---------------+---------+-----------+---------------+--------------+ FV Mid   Full                                                             +---------+---------------+---------+-----------+---------------+--------------+ FV DistalFull                                                             +---------+---------------+---------+-----------+---------------+--------------+ PFV      Full                                                             +---------+---------------+---------+-----------+---------------+--------------+ POP                     Yes      No         pulsatile                                                                 waveforms                     +---------+---------------+---------+-----------+---------------+--------------+ PTV                                                        Not well                                                                  visualized     +---------+---------------+---------+-----------+---------------+--------------+ PERO                                                       Not well  visualized     +---------+---------------+---------+-----------+---------------+--------------+   +---------+---------------+---------+-----------+---------------+--------------+ LEFT     CompressibilityPhasicitySpontaneityProperties     Thrombus Aging +---------+---------------+---------+-----------+---------------+--------------+ CFV      Full                                                             +---------+---------------+---------+-----------+---------------+--------------+ SFJ      Full                                                             +---------+---------------+---------+-----------+---------------+--------------+ FV Prox  Full           Yes      No         pulsatile                                                                 waveforms                     +---------+---------------+---------+-----------+---------------+--------------+ FV Mid   Full                                                             +---------+---------------+---------+-----------+---------------+--------------+ FV DistalFull                                                             +---------+---------------+---------+-----------+---------------+--------------+ PFV      Full                                                             +---------+---------------+---------+-----------+---------------+--------------+ POP      Full                                                             +---------+---------------+---------+-----------+---------------+--------------+ PTV                                                        Not well  visualized     +---------+---------------+---------+-----------+---------------+--------------+ PERO                                                       Not well                                                                   visualized     +---------+---------------+---------+-----------+---------------+--------------+     Summary: RIGHT: - There is no evidence of deep vein thrombosis in the lower extremity.  - No cystic structure found in the popliteal fossa.  LEFT: - There is no evidence of deep vein thrombosis in the lower extremity.  - No cystic structure found in the popliteal fossa.  *See table(s) above for measurements and observations.    Preliminary    DG Chest Port 1 View Result Date: 04/24/2024 CLINICAL DATA:  Shortness of breath EXAM: PORTABLE CHEST 1 VIEW COMPARISON:  Chest radiograph dated 07/29/2018 FINDINGS: Low lung volumes with bronchovascular crowding. New rounded right hilar opacity. Bibasilar patchy and interstitial opacities. Trace blunting of bilateral costophrenic angles. No pneumothorax. Markedly enlarged cardiomediastinal silhouette. Bilateral healing and old rib fractures. IMPRESSION: 1. New rounded right hilar opacity, which may represent a pulmonary mass or lymphadenopathy. Recommend further evaluation with contrast-enhanced chest CT. 2. Bibasilar patchy and interstitial opacities, which may represent atelectasis, edema, or infection. 3. Markedly enlarged cardiomediastinal silhouette. 4. Trace blunting of bilateral costophrenic angles, which may represent trace pleural effusions. Electronically Signed   By: Limin  Xu M.D.   On: 04/24/2024 13:10    Scheduled Meds:    aspirin   81 mg Oral Daily   atorvastatin   80 mg Oral QHS   carvedilol  3.125 mg Oral BID WC   cholecalciferol  1,000 Units Oral Daily   clopidogrel   75 mg Oral Daily   doxycycline  100 mg Oral Q12H   enoxaparin  (LOVENOX ) injection  40 mg Subcutaneous Q24H   fluticasone  2 spray Each Nare Daily   hydrALAZINE   25 mg Oral Q6H   insulin aspart  0-9 Units Subcutaneous TID WC   levETIRAcetam   500 mg Oral BID   levothyroxine   75 mcg Oral QAC breakfast   loratadine   10 mg Oral q  AM   [START ON 04/27/2024] metFORMIN   1,000 mg Oral Q breakfast   rOPINIRole  2 mg Oral QHS    Continuous Infusions:     LOS: 0 days     Aubrey Blas, MD,  FACP, [facility], [facility], [facility]   [facility] [place]      To contact the attending provider between 7A-7P or the covering provider during after hours 7P-7A, please log into the web site [email or web address] and access using universal [place] password for that web site. If you do not have the password, please call the hospital operator.  04/25/2024, 9:12 AM

## 2024-04-25 NOTE — Consult Note (Addendum)
 Cardiology Consultation   Patient ID: RICCI PAFF MRN: [id number]; DOB: 10-20-1941  Admit date: 04/24/2024 Date of Consult: 04/25/2024  PCP:  Helyn Lobstein, MD   [facility] Providers Cardiologist:  None     Patient Profile: Laura [name] is a 83 y.o. female with a hx of deafness, Autism, hyperlipidemia, hypertension, hypothyroidism, restless leg syndrome, history of CVA, type 2 diabetes who is being seen 04/25/2024 for the evaluation of cardiomyopathy at the request of Dr. Sherrod Dolphin.  History of Present Illness: Laura [name] is an 83 year old female with above medical history.  Patient previously had an echocardiogram in 06/2018 that showed EF 55-60%, no regional wall motion abnormalities, grade 1 diastolic dysfunction.  She does not routinely follow with a cardiologist.  Patient presented to the ED on 6/2 for evaluation of bilateral leg swelling, pain, redness.  Symptoms have been ongoing for the past 2 days.  Caregiver also reported increased shortness of breath.  Initial vital signs showed BP 187/138, oxygen 90% on room air, respirations 19 breaths/min, heart rate 110 bpm.  Labs in the ED showed NA 127, creatinine 0.85, WBC 10.1, hemoglobin 11.7.  BNP elevated to 659.  Chest x-ray showed new rounded right hilar opacity,, bibasilar patchy and interstitial opacities, markedly enlarged cardiomediastinal silhouette.  CT chest showed cardiac enlargement, mosaic attenuation pattern to the lungs which may represent edema, air trapping, and airspace infiltrates.  There was prominent mediastinal fat that likely accounted for right hilar changes on prior chest x-ray.  Patient was admitted to the internal medicine service and started on IV Lasix 40 mg twice daily.  Cardiology consulted.  Interview conducted with the help of an ASL interpreter. Patient's health care POA also present at bedside to help provide history. Per POA, she went to visit the patient yesterday and noticed that she  was very short of breath and had significant lower extremity swelling. Her legs were also read and had some wounds visible. There POA also noted that patient has had some recent declines in her cognitive function. She previously was able to write checks by herself, and her POA has had to help her recently. Her cognitive function has been declining slowly for a while, but the progression sped up in the past week or so.    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/2009   RESTLESS LEG SYNDROME 10/01/2008   Stroke (HCC)    TACHYCARDIA 08/27/2008   TREMOR 11/21/2007   TRIGGER FINGER 10/30/2009    Past Surgical History:  Procedure Laterality Date   BELPHAROPTOSIS REPAIR     CATARACT EXTRACTION, BILATERAL     COLONOSCOPY         Scheduled Meds:  aspirin   81 mg Oral Daily   atorvastatin   80 mg Oral QHS   carvedilol  3.125 mg Oral BID WC   cholecalciferol  1,000 Units Oral Daily   clopidogrel   75 mg Oral Daily   doxycycline  100 mg Oral Q12H   enoxaparin  (LOVENOX ) injection  40 mg Subcutaneous Q24H   fluticasone  2 spray Each Nare Daily   furosemide  40 mg Intravenous BID  hydrALAZINE   25 mg Oral Q6H   insulin aspart  0-9 Units Subcutaneous TID WC   levETIRAcetam   500 mg Oral BID   levothyroxine   75 mcg Oral QAC breakfast   loratadine   10 mg Oral q AM   rOPINIRole  2 mg Oral QHS   Continuous Infusions:  PRN Meds: acetaminophen  **OR** acetaminophen , labetalol , naphazoline-glycerin, ondansetron  **OR** ondansetron  (ZOFRAN ) IV, mouth rinse  Allergies:   No Known Allergies  Social History:   Social History   Socioeconomic History   Marital  status: Single    Spouse name: Not on file   Number of children: Not on file   Years of education: Not on file   Highest education level: Not on file  Occupational History   Occupation: Disabled    Employer: UNEMPLOYED  Tobacco Use   Smoking status: Never   Smokeless tobacco: Never  Vaping Use   Vaping status: Never Used  Substance and Sexual Activity   Alcohol use: No   Drug use: No   Sexual activity: Not on file  Other Topics Concern   Not on file  Social History Narrative   Lives alone   Never married   Physical activity is very good   She has never had a driver's license   Does not have a phone at home    Right handed    Social Drivers of Health   Financial Resource Strain: Not on file  Food Insecurity: No Food Insecurity (04/25/2024)   Hunger Vital Sign    Worried About Running Out of Food in the Last Year: Never true    Ran Out of Food in the Last Year: Never true  Transportation Needs: No Transportation Needs (04/25/2024)   PRAPARE - Administrator, Civil Service (Medical): No    Lack of Transportation (Non-Medical): No  Physical Activity: Not on file  Stress: Not on file  Social Connections: Unknown (04/25/2024)   Social Connection and Isolation Panel [NHANES]    Frequency of Communication with Friends and Family: Never    Frequency of Social Gatherings with Friends and Family: Patient unable to answer    Attends Religious Services: Patient unable to answer    Active Member of Clubs or Organizations: Patient unable to answer    Attends Banker Meetings: More than 4 times per year    Marital Status: Never married  Intimate Partner Violence: Not At Risk (04/25/2024)   Humiliation, Afraid, Rape, and Kick questionnaire    Fear of Current or Ex-Partner: No    Emotionally Abused: No    Physically Abused: No    Sexually Abused: No    Family History:    Family History  Problem Relation Age of Onset   Thyroid  disease Neg Hx    Colon cancer  Neg Hx    Colon polyps Neg Hx    Rectal cancer Neg Hx    Stomach cancer Neg Hx      ROS:  Please see the history of present illness.  All other ROS reviewed and negative.     Physical Exam/Data: Vitals:   04/25/24 0103 04/25/24 0107 04/25/24 0540 04/25/24 0906  BP:  (!) 142/78 138/77 134/78  Pulse:  80 79 77  Resp:  19 18 19   Temp:  97.9 F (36.6 C) 97.8 F (36.6 C) 98.1 F (36.7 C)  TempSrc:  Oral Oral Oral  SpO2:  97% 94% 100%  Weight: 68.2 kg     Height: 5\' 2"  (1.575 m)  Intake/Output Summary (Last 24 hours) at 04/25/2024 1037 Last data filed at 04/25/2024 0815 Gross per 24 hour  Intake 360 ml  Output 450 ml  Net -90 ml      04/25/2024    1:03 AM 10/06/2021   11:10 AM 04/07/2021   11:27 AM  Last 3 Weights  Weight (lbs) 150 lb 5.7 oz 137 lb 9.6 oz 136 lb  Weight (kg) 68.2 kg 62.415 kg 61.689 kg     Body mass index is 27.5 kg/m.  General:  Elderly female. Sitting upright in the chair. Sleeping, arouses easily when shaken  HEENT: normal Neck: no JVD Vascular: Radial pulses 2+ bilaterally Cardiac:  normal S1, S2; RRR; no murmur   Lungs:  crackles in bilateral lung bases, normal WOB on [place]  Abd: soft, nontender  Ext: 2+ edema in BLE, lower legs are erythematous and there are a few blisters/wounds present  Musculoskeletal:  No deformities Skin: warm and dry  Psych:  Normal affect   EKG:  The EKG was personally reviewed and demonstrates:  NSR, nonspecific IVCD  Telemetry:  Telemetry was personally reviewed and demonstrates:  NSR   Relevant CV Studies:   Laboratory Data: High Sensitivity Troponin:  No results for input(s): "TROPONINIHS" in the last 720 hours.   Chemistry Recent Labs  Lab 04/24/24 1157 04/25/24 0552  NA 127* 128*  K 3.9 3.6  CL 89* 87*  CO2 26 32  GLUCOSE 107* 128*  BUN 17 15  CREATININE 0.85 0.71  CALCIUM  8.7* 8.5*  GFRNONAA >60 >60  ANIONGAP 12 9    Recent Labs  Lab 04/24/24 1157 04/25/24 0552  PROT 7.2 6.8  ALBUMIN 3.7  3.5  AST 27 22  ALT 30 27  ALKPHOS 83 78  BILITOT 1.0 0.9   Lipids No results for input(s): "CHOL", "TRIG", "HDL", "LABVLDL", "LDLCALC", "CHOLHDL" in the last 168 hours.  Hematology Recent Labs  Lab 04/24/24 1157 04/25/24 0552  WBC 10.1 7.9  RBC 4.69 4.52  HGB 11.7* 11.3*  HCT 38.4 37.3  MCV 81.9 82.5  MCH 24.9* 25.0*  MCHC 30.5 30.3  RDW 14.9 14.9  PLT 381 364   Thyroid  No results for input(s): "TSH", "FREET4" in the last 168 hours.  BNP Recent Labs  Lab 04/24/24 1157  BNP 659.6*    DDimer No results for input(s): "DDIMER" in the last 168 hours.  Radiology/Studies:  VAS US  LOWER EXTREMITY VENOUS (DVT) (ONLY [facility] & [facility]) Result Date: 04/25/2024  Lower Venous DVT Study Patient Name:  ALDEEN RIGA  Date of Exam:   04/24/2024 Medical Rec #: [id number]        Accession #:    [id number] Date of Birth: [birth year]        Patient Gender: F Patient Age:   53 years Exam Location:  [facility] Procedure:      VAS US  LOWER EXTREMITY VENOUS (DVT) Referring Phys: Rice Chamorro MURPHY --------------------------------------------------------------------------------  Indications: Edema, Erythema, and SOB.  Limitations: Body habitus, poor ultrasound/tissue interface and Pitting edema, patient unable to tolerate compression maneuvers or lie flat. Oxygen level dropping, patient instructed to breathe with pursed lips. Comparison Study: Prior negative bilateral LEV done 06/27/2018 Performing Technologist: Carleene Chase RVS  Examination Guidelines: A complete evaluation includes B-mode imaging, spectral Doppler, color Doppler, and power Doppler as needed of all accessible portions of each vessel. Bilateral testing is considered an integral part of a complete examination. Limited examinations for reoccurring indications may be performed as noted. The reflux portion of  the exam is performed with the patient in reverse Trendelenburg.  +---------+---------------+---------+-----------+---------------+--------------+  RIGHT    CompressibilityPhasicitySpontaneityProperties     Thrombus Aging +---------+---------------+---------+-----------+---------------+--------------+ CFV      Full           Yes      No         pulsatile                                                                 waveforms                     +---------+---------------+---------+-----------+---------------+--------------+ SFJ      Full                                                             +---------+---------------+---------+-----------+---------------+--------------+ FV Prox  Full           Yes      No         pulsatile                                                                 waveforms                     +---------+---------------+---------+-----------+---------------+--------------+ FV Mid   Full                                                             +---------+---------------+---------+-----------+---------------+--------------+ FV DistalFull                                                             +---------+---------------+---------+-----------+---------------+--------------+ PFV      Full                                                             +---------+---------------+---------+-----------+---------------+--------------+ POP                     Yes      No         pulsatile  waveforms                     +---------+---------------+---------+-----------+---------------+--------------+ PTV                                                        Not well                                                                  visualized     +---------+---------------+---------+-----------+---------------+--------------+ PERO                                                       Not well                                                                  visualized      +---------+---------------+---------+-----------+---------------+--------------+   +---------+---------------+---------+-----------+---------------+--------------+ LEFT     CompressibilityPhasicitySpontaneityProperties     Thrombus Aging +---------+---------------+---------+-----------+---------------+--------------+ CFV      Full                                                             +---------+---------------+---------+-----------+---------------+--------------+ SFJ      Full                                                             +---------+---------------+---------+-----------+---------------+--------------+ FV Prox  Full           Yes      No         pulsatile                                                                 waveforms                     +---------+---------------+---------+-----------+---------------+--------------+ FV Mid   Full                                                             +---------+---------------+---------+-----------+---------------+--------------+  FV DistalFull                                                             +---------+---------------+---------+-----------+---------------+--------------+ PFV      Full                                                             +---------+---------------+---------+-----------+---------------+--------------+ POP      Full                                                             +---------+---------------+---------+-----------+---------------+--------------+ PTV                                                        Not well                                                                  visualized     +---------+---------------+---------+-----------+---------------+--------------+ PERO                                                       Not well                                                                  visualized      +---------+---------------+---------+-----------+---------------+--------------+     Summary: RIGHT: - There is no evidence of deep vein thrombosis in the lower extremity.  - No cystic structure found in the popliteal fossa.  LEFT: - There is no evidence of deep vein thrombosis in the lower extremity.  - No cystic structure found in the popliteal fossa.  *See table(s) above for measurements and observations. Electronically signed by Delaney Fearing on 04/25/2024 at 10:09:12 AM.    Final    CT CHEST W CONTRAST Result Date: 04/24/2024 CLINICAL DATA:  Abnormal chest radiograph. Redness, swelling, and pain to the lower legs. EXAM: CT CHEST WITH CONTRAST TECHNIQUE: Multidetector CT imaging of the chest was performed during intravenous contrast administration. RADIATION DOSE REDUCTION: This exam was performed according to the departmental dose-optimization program which includes automated exposure control, adjustment of the mA and/or kV according  to patient size and/or use of iterative reconstruction technique. CONTRAST:  75mL OMNIPAQUE IOHEXOL 300 MG/ML  SOLN COMPARISON:  Chest radiograph 04/24/2024 and 07/29/2018 FINDINGS: Cardiovascular: Cardiac enlargement. No pericardial effusions. Normal caliber thoracic aorta. No aortic dissection. Scattered calcification in the aorta and coronary arteries. Central pulmonary arteries are patent without evidence of significant pulmonary embolus. Mediastinum/Nodes: Esophagus is decompressed. No significant lymphadenopathy. Thyroid  gland is unremarkable. Prominent mediastinal fat likely accounts for right hilar chest radiographic abnormality. Lungs/Pleura: Mosaic attenuation pattern to the lungs may be due to edema, airspace disease, or air trapping. Mild atelectasis or infiltration in the right middle lung. Upper Abdomen: No acute abnormalities. Musculoskeletal: Degenerative changes in the spine. No acute bony abnormalities. IMPRESSION: 1. Cardiac enlargement. 2. Mosaic attenuation  pattern to the lungs may represent edema, air trapping, or airspace infiltrates. 3. Prominent mediastinal fat likely accounts for right hilar changes on prior chest radiograph. Electronically Signed   By: Boyce Byes M.D.   On: 04/24/2024 20:11   DG Chest Port 1 View Result Date: 04/24/2024 CLINICAL DATA:  Shortness of breath EXAM: PORTABLE CHEST 1 VIEW COMPARISON:  Chest radiograph dated 07/29/2018 FINDINGS: Low lung volumes with bronchovascular crowding. New rounded right hilar opacity. Bibasilar patchy and interstitial opacities. Trace blunting of bilateral costophrenic angles. No pneumothorax. Markedly enlarged cardiomediastinal silhouette. Bilateral healing and old rib fractures. IMPRESSION: 1. New rounded right hilar opacity, which may represent a pulmonary mass or lymphadenopathy. Recommend further evaluation with contrast-enhanced chest CT. 2. Bibasilar patchy and interstitial opacities, which may represent atelectasis, edema, or infection. 3. Markedly enlarged cardiomediastinal silhouette. 4. Trace blunting of bilateral costophrenic angles, which may represent trace pleural effusions. Electronically Signed   By: Limin  Xu M.D.   On: 04/24/2024 13:10     Assessment and Plan:  Acute on chronic diastolic heart failure - Patient presented to the ED on 6/2 with progressive shortness of breath, bilateral leg swelling/pain/redness - BNP elevated to 659.6 - CT chest showed cardiac enlargement, mosaic attenuation pattern to lungs possibly representing edema versus air trapping versus airspace infiltrates - Now on IV Lasix 40 mg twice daily-I/os incomplete.  Creatinine stable at 0.71.  Sodium slightly improved 127>128. She continues to have leg swelling, crackles in lung bases. Continue IV lasix  - Ordered K supplementation as patient is on IV lasix  - Echo pending  HTN  - Patient currently on PO hydralazine  25 mg q6hrs, carvedilol 3.125 mg BID  - Increase carvedilol to 6.25 mg BID and stop  hydralazine  to reduce pill burden   Otherwise per primary - Bilateral leg cellulitis - Hyponatremia - Normocytic anemia - Hypothyroidism - Type 2 diabetes - Cognitive impairment/autism spectrum/bilateral deafness - History of CVA - on ASA, plavix , lipitor   - Seizure disorder -  Generalized anxiety disorder  Risk Assessment/Risk Scores: [place]  Heart Association (NYHA) Functional Class NYHA Class II For questions or updates, please contact [facility] Please consult [email or web address] for contact info under  Signed, Debria Fang, PA-C  04/25/2024 10:37 AM  Patient seen and examined, note reviewed with the signed Advanced Practice Provider. I personally reviewed laboratory data, imaging studies and relevant notes. I independently examined the patient and formulated the important aspects of the plan. I have personally discussed the plan with the patient and/or family. Comments or changes to the note/plan are indicated below.  Patient seen and examined at her bedside. Her sign language interpreter was present and facilitated the visit.  She offers no complaints at this time.  Acute on chronic Diastolic Heart Failure Hypertension  Type 2 Diabetes Mellitus  History of CVA - on ASA, Plavix    She has been started on IV Lasix we will continue this for now she is still volume overloaded.  We need an echo to assess for any structural abnormalities.  Agree with adjusting her antihypertensives medication , will stop hydralazine  and increase coreg to 6.25 mg BID  We will continue to follow and adjust medication as approprapriate   Gunnar Hereford DO, MS [facility] Attending Cardiologist [facility]  [address] [place], [place] [address] [phone] Website: [email or web address]

## 2024-04-25 NOTE — Progress Notes (Signed)
 [employer] suggested by Bettyjane Brunet, [place]. The number for the services is [phone]. Arranged for sign language interpreter to be here for the doctor to assess at 10:30am on 04/25/2024.

## 2024-04-25 NOTE — Plan of Care (Signed)
  Problem: Metabolic: Goal: Ability to maintain appropriate glucose levels will improve Outcome: Progressing   Problem: Nutritional: Goal: Maintenance of adequate nutrition will improve Outcome: Progressing   Problem: Clinical Measurements: Goal: Respiratory complications will improve Outcome: Progressing Goal: Cardiovascular complication will be avoided Outcome: Progressing   Problem: Elimination: Goal: Will not experience complications related to bowel motility Outcome: Progressing   Problem: Pain Managment: Goal: General experience of comfort will improve and/or be controlled Outcome: Progressing

## 2024-04-26 ENCOUNTER — Inpatient Hospital Stay (HOSPITAL_COMMUNITY)

## 2024-04-26 DIAGNOSIS — I5033 Acute on chronic diastolic (congestive) heart failure: Secondary | ICD-10-CM

## 2024-04-26 DIAGNOSIS — I5021 Acute systolic (congestive) heart failure: Secondary | ICD-10-CM | POA: Insufficient documentation

## 2024-04-26 DIAGNOSIS — E8770 Fluid overload, unspecified: Secondary | ICD-10-CM | POA: Diagnosis not present

## 2024-04-26 DIAGNOSIS — I5023 Acute on chronic systolic (congestive) heart failure: Secondary | ICD-10-CM

## 2024-04-26 LAB — VITAMIN B12: Vitamin B-12: 208 pg/mL (ref 180–914)

## 2024-04-26 LAB — GLUCOSE, CAPILLARY
Glucose-Capillary: 128 mg/dL — ABNORMAL HIGH (ref 70–99)
Glucose-Capillary: 142 mg/dL — ABNORMAL HIGH (ref 70–99)
Glucose-Capillary: 120 mg/dL — ABNORMAL HIGH (ref 70–99)
Glucose-Capillary: 234 mg/dL — ABNORMAL HIGH (ref 70–99)

## 2024-04-26 LAB — ECHOCARDIOGRAM COMPLETE
Calc EF: 40.6 %
Height: 62 in
Single Plane A2C EF: 40.6 %
Single Plane A4C EF: 45.9 %
Weight: 2353.6 [oz_av]

## 2024-04-26 LAB — CBC
HCT: 37.4 % (ref 36.0–46.0)
Hemoglobin: 11.5 g/dL — ABNORMAL LOW (ref 12.0–15.0)
MCH: 25.3 pg — ABNORMAL LOW (ref 26.0–34.0)
MCHC: 30.7 g/dL (ref 30.0–36.0)
MCV: 82.4 fL (ref 80.0–100.0)
Platelets: 332 10*3/uL (ref 150–400)
RBC: 4.54 MIL/uL (ref 3.87–5.11)
RDW: 15 % (ref 11.5–15.5)
WBC: 7.9 10*3/uL (ref 4.0–10.5)
nRBC: 0 % (ref 0.0–0.2)

## 2024-04-26 LAB — BASIC METABOLIC PANEL WITH GFR
Anion gap: 5 (ref 5–15)
BUN: 20 mg/dL (ref 8–23)
CO2: 34 mmol/L — ABNORMAL HIGH (ref 22–32)
Calcium: 7.9 mg/dL — ABNORMAL LOW (ref 8.9–10.3)
Chloride: 91 mmol/L — ABNORMAL LOW (ref 98–111)
Creatinine, Ser: 0.88 mg/dL (ref 0.44–1.00)
GFR, Estimated: >60 mL/min (ref >60)
Glucose, Bld: 171 mg/dL — ABNORMAL HIGH (ref 70–99)
Potassium: 4.1 mmol/L (ref 3.5–5.1)
Sodium: 130 mmol/L — ABNORMAL LOW (ref 135–145)

## 2024-04-26 LAB — BLOOD GAS, ARTERIAL
Acid-Base Excess: 13.7 mmol/L — ABNORMAL HIGH (ref 0.0–2.0)
Acid-Base Excess: 13 mmol/L — ABNORMAL HIGH (ref 0.0–2.0)
Bicarbonate: 40.1 mmol/L — ABNORMAL HIGH (ref 20.0–28.0)
Bicarbonate: 41.4 mmol/L — ABNORMAL HIGH (ref 20.0–28.0)
Drawn by: 30136
Drawn by: 422461
FIO2: 2 %
O2 Content: 2 L/min
O2 Saturation: 99.1 %
O2 Saturation: 98.3 %
Patient temperature: 36.6
Patient temperature: 36.7
pCO2 arterial: 58 mmHg — ABNORMAL HIGH (ref 32–48)
pCO2 arterial: 69 mmHg (ref 32–48)
pH, Arterial: 7.45 (ref 7.35–7.45)
pH, Arterial: 7.38 (ref 7.35–7.45)
pO2, Arterial: 101 mmHg (ref 83–108)
pO2, Arterial: 84 mmHg (ref 83–108)

## 2024-04-26 LAB — TSH: TSH: 5.687 u[IU]/mL — ABNORMAL HIGH (ref 0.350–4.500)

## 2024-04-26 LAB — AMMONIA: Ammonia: 13 umol/L (ref 9–35)

## 2024-04-26 MED ORDER — ALBUTEROL SULFATE (2.5 MG/3ML) 0.083% IN NEBU
2.5000 mg | INHALATION_SOLUTION | RESPIRATORY_TRACT | Status: DC | PRN
  Administered 2024-04-26 – 2024-04-27 (×2): 2.5 mg via RESPIRATORY_TRACT
  Filled 2024-04-26 (×2): qty 3

## 2024-04-26 MED ORDER — CARMEX CLASSIC LIP BALM EX OINT
TOPICAL_OINTMENT | CUTANEOUS | Status: DC | PRN
  Filled 2024-04-26: qty 10

## 2024-04-26 MED ORDER — SPIRONOLACTONE 12.5 MG HALF TABLET
12.5000 mg | ORAL_TABLET | Freq: Every day | ORAL | Status: DC
  Administered 2024-04-26 – 2024-05-01 (×6): 12.5 mg via ORAL
  Filled 2024-04-26 (×6): qty 1

## 2024-04-26 NOTE — Progress Notes (Signed)
*  PRELIMINARY RESULTS* Echocardiogram 2D Echocardiogram has been performed.  Laura [name] 04/26/2024, 10:58 AM

## 2024-04-26 NOTE — Significant Event (Signed)
 Came to see the patient at bedside after notified by nurses that patient was lethargic difficult to arouse this afternoon. Came and saw the patient at bedside she is more alert awake now after abg Follows commands appropriately moving all her extremities equally without focal weakness-has diffuse generalized weakness Left eye with some lid malformation-somewhat difficult to open it appears, especially when she was lethargic earlier- now able to open more, without any focal weakness appreciable. LKN ! 2 hrs ago around 11.30  Obtain CT head, ABG chest x-ray Per HCPOA she does have chronic issues with draining from left eye appears red and lower eye lid more exposed.

## 2024-04-26 NOTE — Progress Notes (Signed)
 PROGRESS NOTE Laura [name]  NGE:[id number] DOB: August 15, 1941 DOA: 04/24/2024 PCP: Helyn Lobstein, MD  Brief Narrative/Hospital Course: 84 year old female, resident of retirement community, lives with caregiver/HCPOA, ambulates with the help of a walker, communicates by sign language, medical history significant for cognitive impairment/autism spectrum, generalized anxiety disorder, bilateral deafness, HLD, HTN, hypothyroid, RLS, stroke, seizure disorder, stage IIIa CKD, DM 2 presented to the ED on 6/2 due to complaints of lower extremity swelling, dyspnea on exertion, abdominal distention.  Limited history due to cognitive impairment and language/communication barrier on admission.  Admitted for suspected new onset acute CHF, started on IV Lasix, echo requested.  Cardiology consulted.   Subjective: Patient seen and examined this morning Overnight afebrile BP in 120s-140s, on nasal cannula oxygen BMP mild hyponatremia improving renal function stable CBC stable  Net negative -540 ml. Intake better, healthcare power of attorney and patient's niece was at bedside discussed plan in detail patient resting comfortably on room air on bedside chair She does not look at short of breath as before and edema is improving  Assessment and plan:  Acute on chronic diastolic CHF: Presenting with progressive shortness of breath, looks swelling pain BNP elevated 659 CT chest edema versus air trapping versus airspace infiltrate.  Echo 07/15/2018 showed LVEF of 55-60% and grade 1 diastolic dysfunction. Lizzi following managing with IV diuretics, added Aldactone, adjust GDMT.  Repeat echo pending Monitor intake output Daily weight electrolytes as below Cont to monitor daily I/O,weight, electrolytes and net balance as below.Keep on  salt/fluid restricted diet and monitor in tele. Net IO Since Admission: -540 mL [04/26/24 1117]  Filed Weights   04/25/24 0103 04/26/24 0500  Weight: 68.2 kg 66.7 kg    Recent Labs  Lab  04/24/24 1157 04/25/24 0552 04/26/24 0816  BNP 659.6*  --   --   BUN 17 15 20   CREATININE 0.85 0.71 0.88  K 3.9 3.6 4.1   Bilateral leg cellulitis, left > right: In the setting of fluid overload and also has tendency to pick up causing wounds. Multiple scabs and also had wheezing previously.  Doppler negative for DVT.  Treating with doxycycline x 5 days   Hypovolemic hyponatremia: Improving.  MonitorCheck TSH (in 2019 was 12.31)   Normocytic anemia May be anemia of chronic disease.  Stable   Hypothyroidism Last TSH in 2019: 12.31.  TSH slightly up 7.9, continue home Synthroid    Dyslipidemia Continue atorvastatin  80 mg daily   Essential hypertension BP fairly well-controlled continue Coreg, Aldactone and diuretics -refer to chf secttion   Type II DM A1c 8.5 on 04/24/2024 Cont to hold metformin  while hospitalized continue sliding scale insulin.  Cognitive impairment/autism spectrum/bilateral deafness Mental status appears at baseline. Cont on supportive care, delirium precautions.  Continue her Keppra  Requip stool softeners   History of stroke Not sure if he is on both aspirin  81 Mg daily + Plavix  75 Mg daily for this indication.  Also on statins.   Seizure disorder Continue Keppra    RLS Continue Requip   Generalized anxiety disorder Does not appear to be on any medications PTA.  Monitor.   Asymptomatic bacteriuria  Body mass index is 27.5 kg/m.   DVT prophylaxis: enoxaparin  (LOVENOX ) injection 40 mg Start: 04/24/24 2200 Code Status:   Code Status: Full Code Family Communication: plan of care discussed with patient/family at bedside. Patient status is: Remains hospitalized because of severity of illness Level of care: Progressive   Dispo: The patient is from: home  Anticipated disposition: TBD Objective: Vitals last 24 hrs: Vitals:   04/25/24 1700 04/25/24 1951 04/26/24 0253 04/26/24 0500  BP: (!) 145/83 128/61 (!) 141/66   Pulse: 84 75 83    Resp:  15 15   Temp:  97.7 F (36.5 C) 97.7 F (36.5 C)   TempSrc:  Oral Oral   SpO2:  100% 98%   Weight:    66.7 kg  Height:        Physical Examination: General exam: alert awake, able to communicate with sign language HEENT:Oral mucosa moist, Ear/Nose WNL grossly Respiratory system: Bilaterally diminished BS, no use of accessory muscle Cardiovascular system: S1 & S2 +. Gastrointestinal system: Abdomen soft, NT,ND,BS+ Nervous System: Alert, awake, following commands. Extremities: LE edema + w/ erythema scab  in legs Skin: No rashes,warm. MSK: Normal muscle bulk/tone.   Data Reviewed: I have personally reviewed following labs and imaging studies ( see epic result tab) CBC: Recent Labs  Lab 04/24/24 1157 04/25/24 0552 04/26/24 0430  WBC 10.1 7.9 7.9  NEUTROABS 8.0*  --   --   HGB 11.7* 11.3* 11.5*  HCT 38.4 37.3 37.4  MCV 81.9 82.5 82.4  PLT 381 364 332   CMP: Recent Labs  Lab 04/24/24 1157 04/25/24 0552 04/26/24 0816  NA 127* 128* 130*  K 3.9 3.6 4.1  CL 89* 87* 91*  CO2 26 32 34*  GLUCOSE 107* 128* 171*  BUN 17 15 20   CREATININE 0.85 0.71 0.88  CALCIUM  8.7* 8.5* 7.9*   GFR: Estimated Creatinine Clearance: 44.9 mL/min (by C-G formula based on SCr of 0.88 mg/dL). Recent Labs  Lab 04/24/24 1157 04/25/24 0552  AST 27 22  ALT 30 27  ALKPHOS 83 78  BILITOT 1.0 0.9  PROT 7.2 6.8  ALBUMIN 3.7 3.5   No results for input(s): "LIPASE", "AMYLASE" in the last 168 hours. No results for input(s): "AMMONIA" in the last 168 hours. Coagulation Profile: No results for input(s): "INR", "PROTIME" in the last 168 hours. Unresulted Labs (From admission, onward)    None      Antimicrobials/Microbiology: Anti-infectives (From admission, onward)    Start     Dose/Rate Route Frequency Ordered Stop   04/25/24 1000  doxycycline (VIBRA-TABS) tablet 100 mg        100 mg Oral Every 12 hours 04/25/24 0900 04/30/24 0959         Component Value Date/Time   SDES URINE,  CLEAN CATCH 07/25/2018 1058   SPECREQUEST  07/25/2018 1058    NONE Performed at [facility], [address] [place], [place] [address]    CULT MULTIPLE SPECIES PRESENT, SUGGEST RECOLLECTION (A) 07/25/2018 1058   REPTSTATUS 07/26/2018 FINAL 07/25/2018 1058    Procedures:  Medications reviewed:  Scheduled Meds:  aspirin   81 mg Oral Daily   atorvastatin   80 mg Oral QHS   carvedilol  6.25 mg Oral BID WC   cholecalciferol  1,000 Units Oral Daily   clopidogrel   75 mg Oral Daily   doxycycline  100 mg Oral Q12H   enoxaparin  (LOVENOX ) injection  40 mg Subcutaneous Q24H   fluticasone  2 spray Each Nare Daily   furosemide  40 mg Intravenous BID   insulin aspart  0-9 Units Subcutaneous TID WC   levETIRAcetam   500 mg Oral BID   levothyroxine   75 mcg Oral QAC breakfast   loratadine   10 mg Oral q AM   polyethylene glycol  17 g Oral Daily   potassium chloride   40  mEq Oral Daily   rOPINIRole  2 mg Oral QHS   senna-docusate  1 tablet Oral BID   spironolactone  12.5 mg Oral Daily   Continuous Infusions:  Lesa Rape, MD [facility] 04/26/2024, 11:34 AM

## 2024-04-26 NOTE — Progress Notes (Signed)
 Bladder scan, 345; pt taken to bedside commode and unable to urinate.  IV lasix administered and in/out cath performed.  out.  RN and NT3 at bedside.

## 2024-04-26 NOTE — Hospital Course (Addendum)
 83 year old female, resident of retirement community, lives with caregiver/HCPOA, ambulates with the help of a walker, communicates by sign language, medical history significant for cognitive impairment/autism spectrum, generalized anxiety disorder, bilateral deafness, HLD, HTN, hypothyroid, RLS, stroke, seizure disorder, stage IIIa CKD, DM 2 presented to the ED on 6/2 due to complaints of lower extremity swelling, dyspnea on exertion, abdominal distention.  Limited history due to cognitive impairment and language/communication barrier on admission.  Admitted for suspected new onset acute CHF, started on IV Lasix . Cardiology consulted> echo obtained with reduced EF of 30-25%, continue on diuresis, GDMT being adjusted. Patient has had significant fluid overload leg edema and is steadily improving.  GDMT has been adjusted and now on oral diuretics Seen and cleared by cardiology today.  Patient is clinically improved.  Plan is for discharge back to facility ALF  Subjective: Patient seen and examined this morning she mobilized in the hallway doing well on room air.  Leg edema has resolved HCPOA and cousin at the bedside Overnight afebrile BP 140s-160s, on room air Labs with stable renal function and CBC. Overall/clinically improved and eager to go home> her weight is down to 63.2 kg  Discharge diagnosis :  Acute HFrEF w. Ef 30-35%: Chronic diastolic CHF history presented with progressive shortness of breath, significant leg edema and BNP 659 CT chest edema versus air trapping versus airspace infiltrate. Echo 07/15/2018 showed LVEF of 55-60% and  G1DD> TTE 6/4-LBBB  Lvef  is 30-35%,decreased function, GHKN, Mild concentric lvh G1DD Cardiology following adjusting diuretics GDMT.  Now with bradycardia Coreg  dose decreased 6/8 and stopped 6/9 due to pauses and braducardia. Leg edema significantly improved, cont po Lasix :GDMT:Aldactone , Farxiga , Entresto , Coreg  and dose being adjusted due to bradycardia on  6/8 and heart rate remained stable at this time. Net IO Since Admission: -7,870 mL [05/01/24 1321]  Filed Weights   04/29/24 0900 04/30/24 0707 05/01/24 0401  Weight: 69.2 kg 63.8 kg 63.2 kg   Intermittent pause and junctional bradycardia: Per cardiology beta-blocker has been discontinued.  Probably from sleep apnea but are not a good candidate for CPAP studies.  Plan for outpatient cardiac monitor.  Continue to monitor as per cardiology as OP. NO MORE BETA BLOCKER cont heart monitor and fu as OP.  Essential hypertension Continue GDMT as above, due to uncontrolled hypertension amlodipine  started 6/7.    Bilateral leg cellulitis, left > right: In the setting of fluid overload and also has tendency to pick up causing wounds. Multiple scabs and also had wheezing previously.  Doppler negative for DVT.  Completed doxycycline  x 5 days   Hypovolemic hyponatremia: Sodium improved    Normocytic anemia Hemoglobin stable/normal at 12    Hypothyroidism Last TSH in 2019: 12.31.  TSH slightly up 7.9, continue home Synthroid   Acute metabolic encephalopathy Chronic hypercapnia PCO2 58-69-ph stable Cognitive impairment/autism spectrum/bilateral deafness History of stroke/Seizure Generalized anxiety disorder: Patient was lethargic difficult to arouse subsequently improved 6/4 and was seen at the bedside no focal weakness>had CT scan of the head 6/4 no new changes.  Mentation improved alert awake oriented at baseline.  She does have chronic left eye redness and lid issues does not close properly per history. Continue her Keppra  Requip  and her Asa 81 Mg daily + Plavix  75  and statins.   Dyslipidemia Continue atorvastatin  80 mg daily   Type II DM with controlled hyperglycemia A1c 8.5 on 04/24/2024.  Overall stable continue on 0 to 6 unit SSI holding metformin  and Farxiga  as above. Resume home meds  Recent Labs  Lab 04/24/24 2104 04/25/24 0728 04/30/24 1133 04/30/24 1655 04/30/24 2144 05/01/24 0745  05/01/24 1124  GLUCAP  --    < > 192* 137* 185* 139* 243*  HGBA1C 8.5*  --   --   --   --   --   --    < > = values in this interval not displayed.    RLS Continue Requip    Goals of care: Patient with poor functional status, now with systolic CHF and dysfunction multiple complex comorbidities  Palliative care was consulted and patient CODE STATUS has been changed to DNR limited.  At high risk of readmissions

## 2024-04-26 NOTE — Progress Notes (Addendum)
 Rounding Note   Patient Name: Laura [name] Date of Encounter: 04/26/2024  [facility] Cardiologist: New (Dr. Emmette Harms)  Subjective In-person interpreter, POA, and nephew are all at bedside this morning. POA says she does not look as noticeably short of breath today and edema is improving some. However, she is still significantly volume overloaded on exam.  Scheduled Meds:  aspirin   81 mg Oral Daily   atorvastatin   80 mg Oral QHS   carvedilol  6.25 mg Oral BID WC   cholecalciferol  1,000 Units Oral Daily   clopidogrel   75 mg Oral Daily   doxycycline  100 mg Oral Q12H   enoxaparin  (LOVENOX ) injection  40 mg Subcutaneous Q24H   fluticasone  2 spray Each Nare Daily   furosemide  40 mg Intravenous BID   insulin aspart  0-9 Units Subcutaneous TID WC   levETIRAcetam   500 mg Oral BID   levothyroxine   75 mcg Oral QAC breakfast   loratadine   10 mg Oral q AM   polyethylene glycol  17 g Oral Daily   potassium chloride   40 mEq Oral Daily   rOPINIRole  2 mg Oral QHS   senna-docusate  1 tablet Oral BID   Continuous Infusions:  PRN Meds: acetaminophen  **OR** acetaminophen , labetalol , lip balm, naphazoline-glycerin, ondansetron  **OR** ondansetron  (ZOFRAN ) IV, mouth rinse   Vital Signs  Vitals:   04/25/24 1700 04/25/24 1951 04/26/24 0253 04/26/24 0500  BP: (!) 145/83 128/61 (!) 141/66   Pulse: 84 75 83   Resp:  15 15   Temp:  97.7 F (36.5 C) 97.7 F (36.5 C)   TempSrc:  Oral Oral   SpO2:  100% 98%   Weight:    66.7 kg  Height:        Intake/Output Summary (Last 24 hours) at 04/26/2024 1036 Last data filed at 04/26/2024 0845 Gross per 24 hour  Intake 1250 ml  Output 1700 ml  Net -450 ml      04/26/2024    5:00 AM 04/25/2024    1:03 AM 10/06/2021   11:10 AM  Last 3 Weights  Weight (lbs) 147 lb 1.6 oz 150 lb 5.7 oz 137 lb 9.6 oz  Weight (kg) 66.724 kg 68.2 kg 62.415 kg      Telemetry Normal sinus rhythm with rates in the 70s to 80s. One missed beat. - Personally  Reviewed  ECG  No new ECG tracing today. - Personally Reviewed  Physical Exam  GEN: No acute distress.   Neck: No JVD. Cardiac: RRR. No murmurs, rubs, or gallops.  Respiratory: No increased work of breathing. Crackles in bilateral bases. GI: Distended.  MS: 2+ pitting edema of bilateral lower extremities. Skin: Erythema of bilateral lower extremities. Neuro:  Baseline cognitive impairment.  Labs High Sensitivity Troponin:  No results for input(s): "TROPONINIHS" in the last 720 hours.   Chemistry Recent Labs  Lab 04/24/24 1157 04/25/24 0552 04/26/24 0816  NA 127* 128* 130*  K 3.9 3.6 4.1  CL 89* 87* 91*  CO2 26 32 34*  GLUCOSE 107* 128* 171*  BUN 17 15 20   CREATININE 0.85 0.71 0.88  CALCIUM  8.7* 8.5* 7.9*  PROT 7.2 6.8  --   ALBUMIN 3.7 3.5  --   AST 27 22  --   ALT 30 27  --   ALKPHOS 83 78  --   BILITOT 1.0 0.9  --   GFRNONAA >60 >60 >60  ANIONGAP 12 9 5     Lipids No results for  input(s): "CHOL", "TRIG", "HDL", "LABVLDL", "LDLCALC", "CHOLHDL" in the last 168 hours.  Hematology Recent Labs  Lab 04/24/24 1157 04/25/24 0552 04/26/24 0430  WBC 10.1 7.9 7.9  RBC 4.69 4.52 4.54  HGB 11.7* 11.3* 11.5*  HCT 38.4 37.3 37.4  MCV 81.9 82.5 82.4  MCH 24.9* 25.0* 25.3*  MCHC 30.5 30.3 30.7  RDW 14.9 14.9 15.0  PLT 381 364 332   Thyroid   Recent Labs  Lab 04/25/24 0549  TSH 7.991*    BNP Recent Labs  Lab 04/24/24 1157  BNP 659.6*    DDimer No results for input(s): "DDIMER" in the last 168 hours.   Cardiac Studies  Echocardiogram 07/15/2018: Study Conclusions: - Left ventricle: The cavity size was normal. Systolic function was    normal. The estimated ejection fraction was in the range of 55%    to 60%. Wall motion was normal; there were no regional wall    motion abnormalities. Doppler parameters are consistent with    abnormal left ventricular relaxation (grade 1 diastolic    dysfunction). Doppler parameters are consistent with elevated    mean left  atrial filling pressure.  - Left atrium: The atrium was mildly dilated.   Patient Profile   83 y.o. female with a history of hypertension, hyperlipidemia, type 2 diabetes mellitus, hypothyroidism, prior CVA, restless leg syndrome, autism, cognitive impairment, deafness, and seizure disorder who is being seen 04/26/2024 for the evaluation of CHF at the request of Dr. Sherrod Dolphin.  Assessment & Plan   Acute on Chronic Diastolic CHF Patient presented to the ED on 6/2 with progressive shortness of breath and bilateral leg swelling/ pain/ redness. BNP elevated at 659. Chest CT showed cardiac enlargement, mosaic attenuation pattern to lungs possibly representing edema versus air trapping versus airspace infiltrates. She was started on IV Lasix with good urinary response. Weight down 3lbs from yesterday. Renal function stable. - Significantly volume overloaded on exam. - Echo pending. - Continue IV Lasix 40mg  twice daily. - Will start Spironolactone 12.5mg  daily. - Will need to continue to add GDMT if EF is down. - Continue to monitor daily weights, strict I/Os, and renal function.  Hypertension BP mildly elevated at times but reasonably well controlled given age. - Continue Coreg 6.25mg  twice daily. - Will start Spironolactone 12.5mg  daily. - Home Hydralazine  was stopped to reduce pill burden.  Otherwise, per primary team: - Bilateral leg cellulitis - Hyponatremia: Na improving with diuresis - Normocytic anemia - Hypothyroidism - Hyperlipidemia - Type 2 diabetes mellitus - History of CVA: On DAPT with Aspirin  and Plavix  - Seizure disorder - Anxiety - Autism - Cognitive impairment  For questions or updates, please contact [facility] Please consult [email or web address] for contact info under   Signed, Callie E Goodrich, PA-C  04/26/2024, 10:36 AM    Patient seen and examined, note reviewed with the signed Advanced Practice Provider. I personally reviewed laboratory data, imaging  studies and relevant notes. I independently examined the patient and formulated the important aspects of the plan. I have personally discussed the plan with the patient and/or family. Comments or changes to the note/plan are indicated below.  Patient was seen and examined at her bedside.  Her interpreter was present.   GEN:  Well nourished, well developed in no acute distress HEENT: Mucous membranes moist, good dentition NECK: No JVD; No carotid bruits LYMPHATICS: No lymphadenopathy CARDIAC: S1S2 noted, RRR, no murmurs, rubs, gallops RESPIRATORY:  Clear to auscultation without rales, wheezing or rhonchi  ABDOMEN: Soft, non-tender, non-distended,  bowel sounds noted, no guarding EXTREMITIES:+3 lower extremity edema  MUSCULOSKELETAL: No deformity  SKIN: Warm and dry  Acute on Chronic diastolic heart failure Hypertension Hyperlipidemia Type 2 diabetes mellitus History of CVA on dual antiplatelet therapy  Clinically she is still appears to be volume overloaded please continue the patient on her current dose of Lasix 40 mg twice a day.  We have added Aldactone to her regimen.  Continue her Coreg 6.25 mg twice a day. Reviewed her echo waiting for official report EF appears to be depressed.  She will need to be on guideline directed medical therapy I discussed with her power of attorney and second cousin who is in the room.  Beatties mellitus per primary team. History of CVA continue to antiplatelet therapy.    Amethyst Gainer DO, MS [facility] Attending Cardiologist [facility]  [address] [place], [place] [address] [phone] Website: [email or web address]

## 2024-04-26 NOTE — Plan of Care (Signed)

## 2024-04-27 DIAGNOSIS — Z515 Encounter for palliative care: Secondary | ICD-10-CM

## 2024-04-27 DIAGNOSIS — E8770 Fluid overload, unspecified: Secondary | ICD-10-CM | POA: Diagnosis not present

## 2024-04-27 DIAGNOSIS — Z7189 Other specified counseling: Secondary | ICD-10-CM

## 2024-04-27 DIAGNOSIS — F840 Autistic disorder: Secondary | ICD-10-CM

## 2024-04-27 DIAGNOSIS — H9193 Unspecified hearing loss, bilateral: Secondary | ICD-10-CM

## 2024-04-27 LAB — BASIC METABOLIC PANEL WITH GFR
Anion gap: 8 (ref 5–15)
BUN: 22 mg/dL (ref 8–23)
CO2: 34 mmol/L — ABNORMAL HIGH (ref 22–32)
Calcium: 8.7 mg/dL — ABNORMAL LOW (ref 8.9–10.3)
Chloride: 87 mmol/L — ABNORMAL LOW (ref 98–111)
Creatinine, Ser: 0.83 mg/dL (ref 0.44–1.00)
GFR, Estimated: >60 mL/min (ref >60)
Glucose, Bld: 161 mg/dL — ABNORMAL HIGH (ref 70–99)
Potassium: 4.3 mmol/L (ref 3.5–5.1)
Sodium: 129 mmol/L — ABNORMAL LOW (ref 135–145)

## 2024-04-27 LAB — CBC
HCT: 38.1 % (ref 36.0–46.0)
Hemoglobin: 11.2 g/dL — ABNORMAL LOW (ref 12.0–15.0)
MCH: 24.7 pg — ABNORMAL LOW (ref 26.0–34.0)
MCHC: 29.4 g/dL — ABNORMAL LOW (ref 30.0–36.0)
MCV: 83.9 fL (ref 80.0–100.0)
Platelets: 395 10*3/uL (ref 150–400)
RBC: 4.54 MIL/uL (ref 3.87–5.11)
RDW: 15 % (ref 11.5–15.5)
WBC: 10.2 10*3/uL (ref 4.0–10.5)
nRBC: 0 % (ref 0.0–0.2)

## 2024-04-27 LAB — GLUCOSE, CAPILLARY
Glucose-Capillary: 157 mg/dL — ABNORMAL HIGH (ref 70–99)
Glucose-Capillary: 332 mg/dL — ABNORMAL HIGH (ref 70–99)
Glucose-Capillary: 56 mg/dL — ABNORMAL LOW (ref 70–99)
Glucose-Capillary: 103 mg/dL — ABNORMAL HIGH (ref 70–99)
Glucose-Capillary: 173 mg/dL — ABNORMAL HIGH (ref 70–99)

## 2024-04-27 MED ORDER — SACUBITRIL-VALSARTAN 24-26 MG PO TABS
1.0000 | ORAL_TABLET | Freq: Two times a day (BID) | ORAL | Status: DC
  Administered 2024-04-27 – 2024-05-01 (×8): 1 via ORAL
  Filled 2024-04-27 (×8): qty 1

## 2024-04-27 MED ORDER — DAPAGLIFLOZIN PROPANEDIOL 10 MG PO TABS
10.0000 mg | ORAL_TABLET | Freq: Every day | ORAL | Status: DC
  Administered 2024-04-27 – 2024-05-01 (×5): 10 mg via ORAL
  Filled 2024-04-27 (×6): qty 1

## 2024-04-27 NOTE — Progress Notes (Signed)
 PROGRESS NOTE Laura [name]  ZOX:[id number] DOB: 10/18/1941 DOA: 04/24/2024 PCP: Helyn Lobstein, MD  Brief Narrative/Hospital Course: 83 year old female, resident of retirement community, lives with caregiver/HCPOA, ambulates with the help of a walker, communicates by sign language, medical history significant for cognitive impairment/autism spectrum, generalized anxiety disorder, bilateral deafness, HLD, HTN, hypothyroid, RLS, stroke, seizure disorder, stage IIIa CKD, DM 2 presented to the ED on 6/2 due to complaints of lower extremity swelling, dyspnea on exertion, abdominal distention.  Limited history due to cognitive impairment and language/communication barrier on admission.  Admitted for suspected new onset acute CHF, started on IV Lasix, echo requested.  Cardiology consulted.   Subjective: Seen and examined On bedside chair/alert awake on bedside chair Alert awake moving all her extremities well no new complaints Left eye with eyelid issue minimal redness Interpreter in the room  Assessment and plan:  Acute on chronic diastolic CHF: Presenting with progressive shortness of breath, looks swelling pain BNP elevated 659 CT chest edema versus air trapping versus airspace infiltrate.  Echo 07/15/2018 showed LVEF of 55-60% and  G1DD> TTE 6/4-  LBBB. Lvef  is 30-35%.decreased function. The left ventricle demonstrates global hypokinesis.  There is mild concentric left ventricular  hypertrophy. Left ventricular diastolic parameters are consistent with  g1dd Cardiology following managing with IV diuretics,  GDMT:Aldactone, Farxiga, Entresto Monitor intake output Daily weight electrolytes as below Cont to monitor daily I/O,weight, electrolytes and net balance as below.Keep on  salt/fluid restricted diet and monitor in tele. Net IO Since Admission: -540 mL [04/26/24 1117]  Filed Weights   04/25/24 0103 04/26/24 0500  Weight: 68.2 kg 66.7 kg    Recent Labs  Lab 04/24/24 1157 04/25/24 0552  04/26/24 0816  BNP 659.6*  --   --   BUN 17 15 20   CREATININE 0.85 0.71 0.88  K 3.9 3.6 4.1   Bilateral leg cellulitis, left > right: In the setting of fluid overload and also has tendency to pick up causing wounds. Multiple scabs and also had wheezing previously.  Doppler negative for DVT.   Cont doxycycline x 5 days   Hypovolemic hyponatremia: Improving.  Continue diuresis and trend labs.   Normocytic anemia May be anemia of chronic disease.  Monitor.     Hypothyroidism Last TSH in 2019: 12.31.  TSH slightly up 7.9, continue home Synthroid   Acute metabolic encephalopathy Chronic hypercapnia PCO2 58-69-ph stable Patient was lethargic difficult to arouse subsequently improved 6/4 had CT scan of the head 6/4 no new changes-mostly alert awake follows commands nonfocal   Dyslipidemia Continue atorvastatin  80 mg daily   Essential hypertension BP fairly well-controlled continue Coreg, Aldactone GDMT   Type II DM A1c 8.5 on 04/24/2024.Cont to hold metformin  while hospitalized continue sliding scale insulin. Recent Labs  Lab 04/24/24 2104 04/25/24 0728 04/26/24 1142 04/26/24 1620 04/26/24 2120 04/27/24 0744 04/27/24 1126  GLUCAP  --    < > 142* 120* 234* 157* 332*  HGBA1C 8.5*  --   --   --   --   --   --    < > = values in this interval not displayed.    Cognitive impairment/autism spectrum/bilateral deafness Mental status appears at baseline. Cont on supportive care, delirium precautions.  Continue her Keppra  Requip stool softeners   History of stroke Cont her home asa 81 Mg daily + Plavix  75 Mg daily for this indication.  Also on statins.   Seizure disorder Continue Keppra    RLS Continue Requip   Generalized anxiety disorder Does  not appear to be on any medications PTA.  Monitor.   Goals of care: Patient with poor functional status, now with systolic CHF and dysfunction multiple complex comorbidities she would benefit palliative care evaluation and has been  consulted currently remains full code encouraged to discuss with patient and HCPOA   DVT prophylaxis: enoxaparin  (LOVENOX ) injection 40 mg Start: 04/24/24 2200 Code Status:   Code Status: Full Code Family Communication: plan of care discussed with patient/family , HCPOA at bedside. Patient status is: Remains hospitalized because of severity of illness Level of care: Progressive   Dispo: The patient is from: home            Anticipated disposition: TBD Objective: Vitals last 24 hrs: Vitals:   04/26/24 1402 04/26/24 2125 04/27/24 0401 04/27/24 1305  BP: 127/75 (!) 145/70 (!) 143/67 (!) 159/86  Pulse: 78 78 80 77  Resp: 18 20 18 20   Temp: 98.1 F (36.7 C) 97.7 F (36.5 C) 97.9 F (36.6 C) 98 F (36.7 C)  TempSrc: Oral Oral Oral Oral  SpO2: 94% 96% 100% 97%  Weight:      Height:        Physical Examination: General exam: alert awake, communicates with sign language and follows commands  HEENT:Oral mucosa moist, Ear/Nose WNL grossly Respiratory system: Bilaterally clear BS,no use of accessory muscle Cardiovascular system: S1 & S2 +, No JVD. Gastrointestinal system: Abdomen soft,NT,ND, BS+ Nervous System: Alert, awake, moving all extremities,and following commands. Extremities: LE edema ++ w/ erythema  Skin: No rashes,no icterus. MSK: Normal muscle bulk,tone, power   Data Reviewed: I have personally reviewed following labs and imaging studies ( see epic result tab) CBC: Recent Labs  Lab 04/24/24 1157 04/25/24 0552 04/26/24 0430 04/27/24 0433  WBC 10.1 7.9 7.9 10.2  NEUTROABS 8.0*  --   --   --   HGB 11.7* 11.3* 11.5* 11.2*  HCT 38.4 37.3 37.4 38.1  MCV 81.9 82.5 82.4 83.9  PLT 381 364 332 395   CMP: Recent Labs  Lab 04/24/24 1157 04/25/24 0552 04/26/24 0816 04/27/24 0433  NA 127* 128* 130* 129*  K 3.9 3.6 4.1 4.3  CL 89* 87* 91* 87*  CO2 26 32 34* 34*  GLUCOSE 107* 128* 171* 161*  BUN 17 15 20 22   CREATININE 0.85 0.71 0.88 0.83  CALCIUM  8.7* 8.5* 7.9*  8.7*   GFR: Estimated Creatinine Clearance: 47.6 mL/min (by C-G formula based on SCr of 0.83 mg/dL). Recent Labs  Lab 04/24/24 1157 04/25/24 0552  AST 27 22  ALT 30 27  ALKPHOS 83 78  BILITOT 1.0 0.9  PROT 7.2 6.8  ALBUMIN 3.7 3.5   No results for input(s): "LIPASE", "AMYLASE" in the last 168 hours.  Recent Labs  Lab 04/26/24 1820  AMMONIA 13   Coagulation Profile: No results for input(s): "INR", "PROTIME" in the last 168 hours. Unresulted Labs (From admission, onward)     Start     Ordered   04/27/24 0500  Basic metabolic panel with GFR  Daily,   R     Question:  Specimen collection method  Answer:  Lab=Lab collect   04/26/24 1254   04/27/24 0500  CBC  Daily,   R     Question:  Specimen collection method  Answer:  Lab=Lab collect   04/26/24 1254           Antimicrobials/Microbiology: Anti-infectives (From admission, onward)    Start     Dose/Rate Route Frequency Ordered Stop   04/25/24  1000  doxycycline (VIBRA-TABS) tablet 100 mg        100 mg Oral Every 12 hours 04/25/24 0900 04/30/24 0959         Component Value Date/Time   SDES URINE, CLEAN CATCH 07/25/2018 1058   SPECREQUEST  07/25/2018 1058    NONE Performed at [facility], [address], [place], [place] [address]    CULT MULTIPLE SPECIES PRESENT, SUGGEST RECOLLECTION (A) 07/25/2018 1058   REPTSTATUS 07/26/2018 FINAL 07/25/2018 1058    Procedures:  Medications reviewed:  Scheduled Meds:  aspirin   81 mg Oral Daily   atorvastatin   80 mg Oral QHS   carvedilol  6.25 mg Oral BID WC   cholecalciferol  1,000 Units Oral Daily   clopidogrel   75 mg Oral Daily   dapagliflozin propanediol  10 mg Oral Daily   doxycycline  100 mg Oral Q12H   enoxaparin  (LOVENOX ) injection  40 mg Subcutaneous Q24H   fluticasone  2 spray Each Nare Daily   furosemide  40 mg Intravenous BID   insulin aspart  0-9 Units Subcutaneous TID WC   levETIRAcetam   500 mg Oral BID   levothyroxine   75 mcg Oral QAC breakfast    loratadine   10 mg Oral q AM   polyethylene glycol  17 g Oral Daily   potassium chloride   40 mEq Oral Daily   rOPINIRole  2 mg Oral QHS   sacubitril-valsartan  1 tablet Oral BID   senna-docusate  1 tablet Oral BID   spironolactone  12.5 mg Oral Daily   Continuous Infusions:  Lesa Rape, MD [facility] 04/27/2024, 1:09 PM

## 2024-04-27 NOTE — Progress Notes (Signed)
 Hypoglycemic Event  CBG: 56  Treatment: 8 oz juice/soda  Symptoms: None  Follow-up CBG: Time:1643 CBG Result:103  Possible Reasons for Event: Inadequate meal intake     Laura [name]

## 2024-04-27 NOTE — TOC Progression Note (Signed)
 Transition of Care [facility]) - Progression Note    Patient Details  Name: Laura [name] MRN: [id number] Date of Birth: 25-Dec-1940  [facility] CM/SW Contact  Gertha Ku, LCSW Phone Number: 04/27/2024, 1:43 PM  Clinical Narrative:    CSW spoke with Laura [name], the pt's POA. She reports that the pt is from [facility]. [name] states the pt uses a walker slowly at baseline and can dress herself, but does so very slowly. She reports the pt will need a sitter or someone to provide additional assistance when she discharges back to the ALF. Although the facility helps with medications, [name] would feel more comfortable if someone were available 24 hours a day. CSW discussed that [employer] can assist with arranging private duty care in the home. [name] agrees to a referral being made. [name] is requesting a wheelchair and has no preference regarding the DME company. She reports she will pick pt up upon discharge. TOC to follow.       Expected Discharge Plan: Assisted Living Barriers to Discharge: Continued Medical Work up  Expected Discharge Plan and Services       Living arrangements for the past 2 months: Assisted Living Facility                                       Social Determinants of Health (SDOH) Interventions SDOH Screenings   Food Insecurity: No Food Insecurity (04/25/2024)  Housing: Low Risk  (04/25/2024)  Transportation Needs: No Transportation Needs (04/25/2024)  Utilities: Not At Risk (04/25/2024)  Social Connections: Unknown (04/25/2024)  Tobacco Use: Low Risk  (04/24/2024)    Readmission Risk Interventions     No data to display

## 2024-04-27 NOTE — Consult Note (Signed)
 Consultation Note Date: 04/27/2024   Patient Name: Laura [name]  DOB: 1941/05/08  MRN: [id number]  Age / Sex: 83 y.o., female   PCP: Helyn Lobstein, MD Referring Physician: Lesa Rape, MD  Reason for Consultation: Establishing goals of care     Chief Complaint/History of Present Illness:   Patient is an 83 year old female with a past medical history of cognitive impairment/autism spectrum, generalized anxiety disorder, bilateral deafness, hyperlipidemia, hypertension, hypothyroidism, RLS, CVA, seizure disorder, CKD, diabetes mellitus who was admitted on 04/24/2024 for management of lower extremity swelling, dyspnea on exertion, and abdominal distention.  [facility] with caregiver/HCPOA.  Patient communicates with sign language and previously was able to ambulate with help of the walker.  During hospitalization, patient found to have acute on chronic heart failure.  Prior EF had shown diastolic heart failure with EF of 60% though repeat echo now showing EF 30-35%.  Cardiology consulted for recommendations.  Palliative medicine team consulted to assist with complex medical decision making.  Extensive review of EMR prior to presenting to bedside.  Reviewed recent cardiology, hospitalist, and TOC documentation.  TOC has discussed with HCPOA further support at time of discharge.  HCPOA concerned that since patient is at assisted living facility, would need more assistance moving forward.  Referral has been made to [employer] to assist with arranging private duty caregivers at the facility.  Cardiology has started patient on GDMT in setting of HFrEF.  Patient has continued to receive diuretics for fluid management. Recent BMP reviewed noting sodium 129, potassium 5.3, chloride 87, BUN 22, creatinine 0.83, and GFR estimated >60.  Patient's A1c on admission was 8.5.  Patient's BNP on admission was 659.6.  Personally reviewed patient's recent chest  x-ray from 04/27/2024 noting improvement of pulmonary edema.  Presented to bedside to see patient.  Patient seen sitting up in chair at bedside.  No family or visitors present at bedside.  RN present at bedside as well.  RN noted that patient's HCPOA and family had gone to run errands and have not returned as of yet.  Noted benefit of discussion would be my HCPOA and family present with patient.  Patient's HCPOA is a sign language interpreter discussed with discussions. Patient comfortably sitting in chair requesting a Coke.  Nurse assisting with this. RN to inform this provider when family returns to bedside.  If unable to meet with family and HCPOA today, would attempt for meeting tomorrow. Palliative medicine team continuing to follow along with patient's medical journey.  Primary Diagnoses  Present on Admission:  Volume overload  Essential hypertension  Hypothyroidism  Asymptomatic bacteriuria  Hyponatremia  Normocytic anemia  Dyslipidemia  Autism spectrum disorder  RESTLESS LEG SYNDROME   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/2009   RESTLESS LEG SYNDROME 10/01/2008   Stroke (HCC)    TACHYCARDIA 08/27/2008   TREMOR 11/21/2007   TRIGGER FINGER 10/30/2009   Social History   Socioeconomic History   Marital status: Single    Spouse name: Not on file   Number of children: Not on file   Years of  education: Not on file   Highest education level: Not on file  Occupational History   Occupation: Disabled    Employer: UNEMPLOYED  Tobacco Use   Smoking status: Never   Smokeless tobacco: Never  Vaping Use   Vaping status: Never Used   Substance and Sexual Activity   Alcohol use: No   Drug use: No   Sexual activity: Not on file  Other Topics Concern   Not on file  Social History Narrative   Lives alone   Never married   Physical activity is very good   She has never had a driver's license   Does not have a phone at home    Right handed    Social Drivers of Health   Financial Resource Strain: Not on file  Food Insecurity: No Food Insecurity (04/25/2024)   Hunger Vital Sign    Worried About Running Out of Food in the Last Year: Never true    Ran Out of Food in the Last Year: Never true  Transportation Needs: No Transportation Needs (04/25/2024)   PRAPARE - Administrator, Civil Service (Medical): No    Lack of Transportation (Non-Medical): No  Physical Activity: Not on file  Stress: Not on file  Social Connections: Unknown (04/25/2024)   Social Connection and Isolation Panel [NHANES]    Frequency of Communication with Friends and Family: Never    Frequency of Social Gatherings with Friends and Family: Patient unable to answer    Attends Religious Services: Patient unable to answer    Active Member of Clubs or Organizations: Patient unable to answer    Attends Engineer, structural: More than 4 times per year    Marital Status: Never married   Family History  Problem Relation Age of Onset   Thyroid  disease Neg Hx    Colon cancer Neg Hx    Colon polyps Neg Hx    Rectal cancer Neg Hx    Stomach cancer Neg Hx    Scheduled Meds:  aspirin   81 mg Oral Daily   atorvastatin   80 mg Oral QHS   carvedilol  6.25 mg Oral BID WC   cholecalciferol  1,000 Units Oral Daily   clopidogrel   75 mg Oral Daily   doxycycline  100 mg Oral Q12H   enoxaparin  (LOVENOX ) injection  40 mg Subcutaneous Q24H   fluticasone  2 spray Each Nare Daily   furosemide  40 mg Intravenous BID   insulin aspart  0-9 Units Subcutaneous TID WC   levETIRAcetam   500 mg Oral BID   levothyroxine   75 mcg Oral QAC breakfast    loratadine   10 mg Oral q AM   polyethylene glycol  17 g Oral Daily   potassium chloride   40 mEq Oral Daily   rOPINIRole  2 mg Oral QHS   senna-docusate  1 tablet Oral BID   spironolactone  12.5 mg Oral Daily   Continuous Infusions: PRN Meds:.acetaminophen  **OR** acetaminophen , albuterol, labetalol , lip balm, naphazoline-glycerin, ondansetron  **OR** ondansetron  (ZOFRAN ) IV, mouth rinse No Known Allergies CBC:    Component Value Date/Time   WBC 10.2 04/27/2024 0433   HGB 11.2 (L) 04/27/2024 0433   HCT 38.1 04/27/2024 0433   PLT 395 04/27/2024 0433   MCV 83.9 04/27/2024 0433   NEUTROABS 8.0 (H) 04/24/2024 1157   LYMPHSABS 0.9 04/24/2024 1157   MONOABS 1.0 04/24/2024 1157   EOSABS 0.1 04/24/2024 1157   BASOSABS 0.1 04/24/2024 1157   Comprehensive Metabolic Panel:  Component Value Date/Time   NA 129 (L) 04/27/2024 0433   NA 134 (A) 04/20/2017 0000   K 4.3 04/27/2024 0433   CL 87 (L) 04/27/2024 0433   CO2 34 (H) 04/27/2024 0433   BUN 22 04/27/2024 0433   BUN 13 04/20/2017 0000   CREATININE 0.83 04/27/2024 0433   GLUCOSE 161 (H) 04/27/2024 0433   CALCIUM  8.7 (L) 04/27/2024 0433   AST 22 04/25/2024 0552   ALT 27 04/25/2024 0552   ALKPHOS 78 04/25/2024 0552   BILITOT 0.9 04/25/2024 0552   PROT 6.8 04/25/2024 0552   ALBUMIN 3.5 04/25/2024 0552    Physical Exam: Vital Signs: BP (!) 143/67 (BP Location: Left Arm)   Pulse 80   Temp 97.9 F (36.6 C) (Oral)   Resp 18   Ht 5\' 2"  (1.575 m)   Wt 66.7 kg   SpO2 100%   BMI 26.90 kg/m  SpO2: SpO2: 100 % O2 Device: O2 Device: Nasal Cannula O2 Flow Rate: O2 Flow Rate (L/min): 2 L/min Intake/output summary:  Intake/Output Summary (Last 24 hours) at 04/27/2024 0954 Last data filed at 04/27/2024 0830 Gross per 24 hour  Intake 720 ml  Output 1875 ml  Net -1155 ml   LBM: Last BM Date : 04/27/24 Baseline Weight: Weight: 68.2 kg Most recent weight: Weight: 66.7 kg  General: NAD, awake, sitting in bedside chair chronically  ill-appearing Cardiovascular: RRR Respiratory: no increased work of breathing noted, not in respiratory distress Neuro: Awake, interactive          Palliative Performance Scale: 50%              Additional Data Reviewed: Recent Labs    04/26/24 0430 04/26/24 0816 04/27/24 0433  WBC 7.9  --  10.2  HGB 11.5*  --  11.2*  PLT 332  --  395  NA  --  130* 129*  BUN  --  20 22  CREATININE  --  0.88 0.83    Imaging: ECHOCARDIOGRAM COMPLETE    ECHOCARDIOGRAM REPORT       Patient Name:   IEASHA BOEREMA Date of Exam: 04/26/2024 Medical Rec #:  [id number]       Height:       62.0 in Accession #:    [id number]      Weight:       147.1 lb Date of Birth:  1941/08/26       BSA:          1.678 m Patient Age:    83 years        BP:           141/66 mmHg Patient Gender: F               HR:           81 bpm. Exam Location:  Inpatient  Procedure: 2D Echo, Color Doppler and Cardiac Doppler (Both Spectral and Color            Flow Doppler were utilized during procedure).  Indications:    CHF   History:        Patient has prior history of Echocardiogram examinations, most                 recent 07/15/2018. Risk Factors:Diabetes.   Sonographer:    Jeralene Mom Referring Phys: [id number] DAVID MANUEL ORTIZ  IMPRESSIONS   1. Septal dyssynerg in setting of LBBB. Left ventricular ejection fraction, by estimation, is 30 to 35%. The left  ventricle has moderately decreased function. The left ventricle demonstrates global hypokinesis. There is mild concentric left ventricular  hypertrophy. Left ventricular diastolic parameters are consistent with Grade I diastolic dysfunction (impaired relaxation).  2. Right ventricular systolic function is moderately reduced. The right ventricular size is normal.  3. Left atrial size was mildly dilated.  4. Right atrial size was mildly dilated.  5. The mitral valve is degenerative. Mild mitral valve regurgitation. No evidence of mitral stenosis.  6. The aortic  valve is tricuspid. There is mild calcification of the aortic valve. Aortic valve regurgitation is not visualized. Aortic valve sclerosis/calcification is present, without any evidence of aortic stenosis.  7. The inferior vena cava is normal in size with greater than 50% respiratory variability, suggesting right atrial pressure of 3 mmHg.  FINDINGS  Left Ventricle: Septal dyssynerg in setting of LBBB. Left ventricular ejection fraction, by estimation, is 30 to 35%. The left ventricle has moderately decreased function. The left ventricle demonstrates global hypokinesis. The left ventricular internal  cavity size was normal in size. There is mild concentric left ventricular hypertrophy. Abnormal (paradoxical) septal motion, consistent with left bundle branch block. Left ventricular diastolic parameters are consistent with Grade I diastolic  dysfunction (impaired relaxation).  Right Ventricle: The right ventricular size is normal. No increase in right ventricular wall thickness. Right ventricular systolic function is moderately reduced.  Left Atrium: Left atrial size was mildly dilated.  Right Atrium: Right atrial size was mildly dilated.  Pericardium: There is no evidence of pericardial effusion.  Mitral Valve: The mitral valve is degenerative in appearance. Mild mitral valve regurgitation. No evidence of mitral valve stenosis.  Tricuspid Valve: The tricuspid valve is normal in structure. Tricuspid valve regurgitation is mild . No evidence of tricuspid stenosis.  Aortic Valve: The aortic valve is tricuspid. There is mild calcification of the aortic valve. Aortic valve regurgitation is not visualized. Aortic valve sclerosis/calcification is present, without any evidence of aortic stenosis.  Pulmonic Valve: The pulmonic valve was normal in structure. Pulmonic valve regurgitation is not visualized. No evidence of pulmonic stenosis.  Aorta: The aortic root is normal in size and  structure.  Venous: The inferior vena cava is normal in size with greater than 50% respiratory variability, suggesting right atrial pressure of 3 mmHg.  IAS/Shunts: No atrial level shunt detected by color flow Doppler.      LV Volumes (MOD) LV vol d, MOD A2C: 94.2 ml LV vol d, MOD A4C: 95.3 ml LV vol s, MOD A2C: 56.0 ml LV vol s, MOD A4C: 51.6 ml LV SV MOD A2C:     38.2 ml LV SV MOD A4C:     95.3 ml LV SV MOD BP:      38.9 ml  Jules Oar MD Electronically signed by Jules Oar MD Signature Date/Time: 04/26/2024/8:22:14 PM      Final   CT HEAD WO CONTRAST ( ) CLINICAL DATA:  Delirium, stroke-like symptoms. Left-sided facial droop and lethargy.  EXAM: CT HEAD WITHOUT CONTRAST  TECHNIQUE: Contiguous axial images were obtained from the base of the skull through the vertex without intravenous contrast.  RADIATION DOSE REDUCTION: This exam was performed according to the departmental dose-optimization program which includes automated exposure control, adjustment of the mA and/or kV according to patient size and/or use of iterative reconstruction technique.  COMPARISON:  CT head 05/13/2019.  FINDINGS: Brain: No acute intracranial hemorrhage. No CT evidence of acute infarct. Encephalomalacia in the left temporal lobe suggestive of remote infarct. Additional remote infarct involving  the left basal ganglia. Nonspecific hypoattenuation in the periventricular and subcortical white matter favored to reflect chronic microvascular ischemic changes. Mild parenchymal volume loss. No edema, mass effect, or midline shift. The basilar cisterns are patent.  Ventricles: Ex vacuo dilatation of the left lateral ventricle.  Vascular: Atherosclerotic calcifications of the carotid siphons. No hyperdense vessel.  Skull: No acute or aggressive finding.  Orbits: Orbits are symmetric.  Sinuses: Mild mucosal thickening in the ethmoid and maxillary sinuses.  Other: Mastoid air  cells are clear.  IMPRESSION: No CT evidence of acute intracranial abnormality.  Similar encephalomalacia in the left occipital lobe. Chronic microvascular ischemic changes.  Electronically Signed   By: Denny Flack M.D.   On: 04/26/2024 16:59 DG Chest Port 1 View CLINICAL DATA:  10026 Shortness of breath 786.05.ICD-9-CM  EXAM: PORTABLE CHEST - 1 VIEW  COMPARISON:  April 24, 2024  FINDINGS: No focal airspace consolidation, pleural effusion, or pneumothorax. Unchanged moderate to severe cardiomegaly. Tortuosity/ectasia of the aorta, unchanged. Prominence of the right hilar region, likely due to patient's positioning and tortuosity of the aorta. No acute fracture or destructive lesions. Multilevel thoracic osteophytosis. Remote right rib fractures.  IMPRESSION: Unchanged cardiomegaly.  No pneumonia or pulmonary edema.  Electronically Signed   By: Rance Burrows M.D.   On: 04/26/2024 13:44    I personally reviewed recent imaging.   Palliative Care Assessment and Plan Summary of Established Goals of Care and Medical Treatment Preferences   Patient is an 83 year old female with a past medical history of cognitive impairment/autism spectrum, generalized anxiety disorder, bilateral deafness, hyperlipidemia, hypertension, hypothyroidism, RLS, CVA, seizure disorder, CKD, diabetes mellitus who was admitted on 04/24/2024 for management of lower extremity swelling, dyspnea on exertion, and abdominal distention.  Patient normally resides in [facility] with caregiver/HCPOA.  Patient communicates with sign language and previously was able to ambulate with help of the walker.  During hospitalization, patient found to have acute on chronic heart failure.  Prior EF had shown diastolic heart failure with EF of 60% though repeat echo now showing EF 30-35%.  Cardiology consulted for recommendations.  Palliative medicine team consulted to assist with complex medical decision  making.  # Complex medical decision making/goals of care  - Unable to discuss complex medical decision making with patient today.  Patient's HCPOA had left bedside and was to be returning at a later time.  Would be most beneficial to discuss care with HCPOA and patient at the same time especially since HCPOA is patient's ASL interpreter.  Discussed with RN who will inform provider when family presents to bedside.  Likely have meeting tomorrow to discuss care moving forward.  Palliative medicine team will continue to follow along and engage in conversations as able and appropriate  -  Code Status: Full Code   # Psycho-social/Spiritual Support:  - Support System: Katherne Pander  # Discharge Planning:  To Be Determined  Thank you for allowing the palliative care team to participate in the care [name].  Barnett Libel, DO Palliative Care Provider PMT # [phone]  If patient remains symptomatic despite maximum doses, please call PMT at [phone] between 0700 and 1900. Outside of these hours, please call attending, as PMT does not have night coverage.  Personally spent 55 minutes in patient care including extensive chart review (labs, imaging, progress/consult notes, vital signs), medically appropraite exam, discussed with treatment team, education to patient, family, and staff, documenting clinical information, medication review and management, coordination of care, and available advanced directive  documents.

## 2024-04-27 NOTE — Progress Notes (Signed)
 Heart Failure Navigator Progress Note  Assessed for Heart & Vascular TOC clinic readiness.  Patient does not meet criteria due to per MD note medical history significant for cognitive impairment/autism spectrum, deafness, poor functional status, EF 30-35%, a palliative care has been consulted. No HF TOC.   Navigator will sign off at this time.   Randie Bustle, BSN, Scientist, clinical (histocompatibility and immunogenetics) Only

## 2024-04-27 NOTE — Progress Notes (Signed)
 Progress Note  Patient Name: Laura [name] Date of Encounter: 04/27/2024  Primary Cardiologist: None   Subjective   Patient seen examined her bedside.  Interpreter in the room.  Inpatient Medications    Scheduled Meds:  aspirin   81 mg Oral Daily   atorvastatin   80 mg Oral QHS   carvedilol  6.25 mg Oral BID WC   cholecalciferol  1,000 Units Oral Daily   clopidogrel   75 mg Oral Daily   doxycycline  100 mg Oral Q12H   enoxaparin  (LOVENOX ) injection  40 mg Subcutaneous Q24H   fluticasone  2 spray Each Nare Daily   furosemide  40 mg Intravenous BID   insulin aspart  0-9 Units Subcutaneous TID WC   levETIRAcetam   500 mg Oral BID   levothyroxine   75 mcg Oral QAC breakfast   loratadine   10 mg Oral q AM   polyethylene glycol  17 g Oral Daily   potassium chloride   40 mEq Oral Daily   rOPINIRole  2 mg Oral QHS   senna-docusate  1 tablet Oral BID   spironolactone  12.5 mg Oral Daily   Continuous Infusions:  PRN Meds: acetaminophen  **OR** acetaminophen , albuterol, labetalol , lip balm, naphazoline-glycerin, ondansetron  **OR** ondansetron  (ZOFRAN ) IV, mouth rinse   Vital Signs    Vitals:   04/26/24 1238 04/26/24 1402 04/26/24 2125 04/27/24 0401  BP: 124/61 127/75 (!) 145/70 (!) 143/67  Pulse: 64 78 78 80  Resp: (!) 24 18 20 18   Temp: 97.8 F (36.6 C) 98.1 F (36.7 C) 97.7 F (36.5 C) 97.9 F (36.6 C)  TempSrc: Oral Oral Oral Oral  SpO2: 97% 94% 96% 100%  Weight:      Height:        Intake/Output Summary (Last 24 hours) at 04/27/2024 1130 Last data filed at 04/27/2024 1035 Gross per 24 hour  Intake 840 ml  Output 1975 ml  Net -1135 ml   Filed Weights   04/25/24 0103 04/26/24 0500  Weight: 68.2 kg 66.7 kg    Telemetry     - Personally Reviewed  ECG     - Personally Reviewed  Physical Exam     General: Comfortable, sitting up in a chair Head: Atraumatic, normal size  Eyes: PEERLA, EOMI  Neck: Supple, normal JVD Cardiac: Normal S1, S2; RRR; no murmurs,  rubs, or gallops Lungs: Clear to auscultation bilaterally Abd: Soft, nontender, no hepatomegaly  Ext: warm, bilateral lower extremity edema Musculoskeletal: No deformities, BUE and BLE strength normal and equal Skin: Warm and dry, no rashes   Neuro: Alert and oriented to person, place, time, and situation, CNII-XII grossly intact, no focal deficits  Psych: Normal mood and affect   Labs    Chemistry Recent Labs  Lab 04/24/24 1157 04/25/24 0552 04/26/24 0816 04/27/24 0433  NA 127* 128* 130* 129*  K 3.9 3.6 4.1 4.3  CL 89* 87* 91* 87*  CO2 26 32 34* 34*  GLUCOSE 107* 128* 171* 161*  BUN 17 15 20 22   CREATININE 0.85 0.71 0.88 0.83  CALCIUM  8.7* 8.5* 7.9* 8.7*  PROT 7.2 6.8  --   --   ALBUMIN 3.7 3.5  --   --   AST 27 22  --   --   ALT 30 27  --   --   ALKPHOS 83 78  --   --   BILITOT 1.0 0.9  --   --   GFRNONAA >60 >60 >60 >60  ANIONGAP 12 9 5  8  Hematology Recent Labs  Lab 04/25/24 0552 04/26/24 0430 04/27/24 0433  WBC 7.9 7.9 10.2  RBC 4.52 4.54 4.54  HGB 11.3* 11.5* 11.2*  HCT 37.3 37.4 38.1  MCV 82.5 82.4 83.9  MCH 25.0* 25.3* 24.7*  MCHC 30.3 30.7 29.4*  RDW 14.9 15.0 15.0  PLT 364 332 395    Cardiac EnzymesNo results for input(s): "TROPONINI" in the last 168 hours. No results for input(s): "TROPIPOC" in the last 168 hours.   BNP Recent Labs  Lab 04/24/24 1157  BNP 659.6*     DDimer No results for input(s): "DDIMER" in the last 168 hours.   Radiology    ECHOCARDIOGRAM COMPLETE Result Date: 04/26/2024    ECHOCARDIOGRAM REPORT   Patient Name:   NATACHA JEPSEN Date of Exam: 04/26/2024 Medical Rec #:  [id number]       Height:       62.0 in Accession #:    [id number]      Weight:       147.1 lb Date of Birth:  [birth year]       BSA:          1.678 m Patient Age:    83 years        BP:           141/66 mmHg Patient Gender: F               HR:           81 bpm. Exam Location:  Inpatient Procedure: 2D Echo, Color Doppler and Cardiac Doppler (Both Spectral  and Color            Flow Doppler were utilized during procedure). Indications:    CHF  History:        Patient has prior history of Echocardiogram examinations, most                 recent 07/15/2018. Risk Factors:Diabetes.  Sonographer:    Jeralene Mom Referring Phys: [id number] DAVID MANUEL ORTIZ IMPRESSIONS  1. Septal dyssynerg in setting of LBBB. Left ventricular ejection fraction, by estimation, is 30 to 35%. The left ventricle has moderately decreased function. The left ventricle demonstrates global hypokinesis. There is mild concentric left ventricular hypertrophy. Left ventricular diastolic parameters are consistent with Grade I diastolic dysfunction (impaired relaxation).  2. Right ventricular systolic function is moderately reduced. The right ventricular size is normal.  3. Left atrial size was mildly dilated.  4. Right atrial size was mildly dilated.  5. The mitral valve is degenerative. Mild mitral valve regurgitation. No evidence of mitral stenosis.  6. The aortic valve is tricuspid. There is mild calcification of the aortic valve. Aortic valve regurgitation is not visualized. Aortic valve sclerosis/calcification is present, without any evidence of aortic stenosis.  7. The inferior vena cava is normal in size with greater than 50% respiratory variability, suggesting right atrial pressure of 3 mmHg. FINDINGS  Left Ventricle: Septal dyssynerg in setting of LBBB. Left ventricular ejection fraction, by estimation, is 30 to 35%. The left ventricle has moderately decreased function. The left ventricle demonstrates global hypokinesis. The left ventricular internal  cavity size was normal in size. There is mild concentric left ventricular hypertrophy. Abnormal (paradoxical) septal motion, consistent with left bundle branch block. Left ventricular diastolic parameters are consistent with Grade I diastolic dysfunction (impaired relaxation). Right Ventricle: The right ventricular size is normal. No increase in  right ventricular wall thickness. Right ventricular systolic function is moderately reduced. Left Atrium: Left atrial  size was mildly dilated. Right Atrium: Right atrial size was mildly dilated. Pericardium: There is no evidence of pericardial effusion. Mitral Valve: The mitral valve is degenerative in appearance. Mild mitral valve regurgitation. No evidence of mitral valve stenosis. Tricuspid Valve: The tricuspid valve is normal in structure. Tricuspid valve regurgitation is mild . No evidence of tricuspid stenosis. Aortic Valve: The aortic valve is tricuspid. There is mild calcification of the aortic valve. Aortic valve regurgitation is not visualized. Aortic valve sclerosis/calcification is present, without any evidence of aortic stenosis. Pulmonic Valve: The pulmonic valve was normal in structure. Pulmonic valve regurgitation is not visualized. No evidence of pulmonic stenosis. Aorta: The aortic root is normal in size and structure. Venous: The inferior vena cava is normal in size with greater than 50% respiratory variability, suggesting right atrial pressure of 3 mmHg. IAS/Shunts: No atrial level shunt detected by color flow Doppler.   LV Volumes (MOD) LV vol d, MOD A2C: 94.2 ml LV vol d, MOD A4C: 95.3 ml LV vol s, MOD A2C: 56.0 ml LV vol s, MOD A4C: 51.6 ml LV SV MOD A2C:     38.2 ml LV SV MOD A4C:     95.3 ml LV SV MOD BP:      38.9 ml Jules Oar MD Electronically signed by Jules Oar MD Signature Date/Time: 04/26/2024/8:22:14 PM    Final    CT HEAD WO CONTRAST ( ) Result Date: 04/26/2024 CLINICAL DATA:  Delirium, stroke-like symptoms. Left-sided facial droop and lethargy. EXAM: CT HEAD WITHOUT CONTRAST TECHNIQUE: Contiguous axial images were obtained from the base of the skull through the vertex without intravenous contrast. RADIATION DOSE REDUCTION: This exam was performed according to the departmental dose-optimization program which includes automated exposure control, adjustment of the mA  and/or kV according to patient size and/or use of iterative reconstruction technique. COMPARISON:  CT head 05/13/2019. FINDINGS: Brain: No acute intracranial hemorrhage. No CT evidence of acute infarct. Encephalomalacia in the left temporal lobe suggestive of remote infarct. Additional remote infarct involving the left basal ganglia. Nonspecific hypoattenuation in the periventricular and subcortical white matter favored to reflect chronic microvascular ischemic changes. Mild parenchymal volume loss. No edema, mass effect, or midline shift. The basilar cisterns are patent. Ventricles: Ex vacuo dilatation of the left lateral ventricle. Vascular: Atherosclerotic calcifications of the carotid siphons. No hyperdense vessel. Skull: No acute or aggressive finding. Orbits: Orbits are symmetric. Sinuses: Mild mucosal thickening in the ethmoid and maxillary sinuses. Other: Mastoid air cells are clear. IMPRESSION: No CT evidence of acute intracranial abnormality. Similar encephalomalacia in the left occipital lobe. Chronic microvascular ischemic changes. Electronically Signed   By: Denny Flack M.D.   On: 04/26/2024 16:59   DG Chest Port 1 View Result Date: 04/26/2024 CLINICAL DATA:  10026 Shortness of breath 786.05.ICD-9-CM EXAM: PORTABLE CHEST - 1 VIEW COMPARISON:  April 24, 2024 FINDINGS: No focal airspace consolidation, pleural effusion, or pneumothorax. Unchanged moderate to severe cardiomegaly. Tortuosity/ectasia of the aorta, unchanged. Prominence of the right hilar region, likely due to patient's positioning and tortuosity of the aorta. No acute fracture or destructive lesions. Multilevel thoracic osteophytosis. Remote right rib fractures. IMPRESSION: Unchanged cardiomegaly.  No pneumonia or pulmonary edema. Electronically Signed   By: Rance Burrows M.D.   On: 04/26/2024 13:44    Cardiac Studies   Echo reviewed   Patient Profile     83 y.o. 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  Continue Lasix 40 mg IV twice daily.  She tolerated the Aldactone yesterday.  Unfortunately her EF is depressed 30 to 35% she needs guideline directed medical therapy.  Will add Farxiga and Entresto today.  I do believe her blood pressure can handle this but we will monitor if adjustment needs to be made.  Kidney function remains normal.  Her power of attorney and family member was at the bedside and had a lot of questions about palliative care and CODE STATUS at the best explained to them.  They also were wondering about her functional capacity I do think that she would benefit from physical therapy and occupational therapies evaluation once she has been effectively diuresed.  Will defer to the hospitalist team to get this arranged.   For questions or updates, please contact [facility] Please consult [email or web address] for contact info under Cardiology/STEMI.      Signed, Sharlisa Hollifield, DO  04/27/2024, 11:30 AM

## 2024-04-28 DIAGNOSIS — R4589 Other symptoms and signs involving emotional state: Secondary | ICD-10-CM

## 2024-04-28 DIAGNOSIS — Z7189 Other specified counseling: Secondary | ICD-10-CM

## 2024-04-28 DIAGNOSIS — Z66 Do not resuscitate: Secondary | ICD-10-CM

## 2024-04-28 DIAGNOSIS — E8770 Fluid overload, unspecified: Secondary | ICD-10-CM | POA: Diagnosis not present

## 2024-04-28 LAB — CBC
HCT: 40.1 % (ref 36.0–46.0)
Hemoglobin: 12 g/dL (ref 12.0–15.0)
MCH: 24.9 pg — ABNORMAL LOW (ref 26.0–34.0)
MCHC: 29.9 g/dL — ABNORMAL LOW (ref 30.0–36.0)
MCV: 83.2 fL (ref 80.0–100.0)
Platelets: 391 10*3/uL (ref 150–400)
RBC: 4.82 MIL/uL (ref 3.87–5.11)
RDW: 14.7 % (ref 11.5–15.5)
WBC: 8.2 10*3/uL (ref 4.0–10.5)
nRBC: 0 % (ref 0.0–0.2)

## 2024-04-28 LAB — BASIC METABOLIC PANEL WITH GFR
Anion gap: 9 (ref 5–15)
BUN: 21 mg/dL (ref 8–23)
CO2: 32 mmol/L (ref 22–32)
Calcium: 8.7 mg/dL — ABNORMAL LOW (ref 8.9–10.3)
Chloride: 89 mmol/L — ABNORMAL LOW (ref 98–111)
Creatinine, Ser: 0.85 mg/dL (ref 0.44–1.00)
GFR, Estimated: >60 mL/min (ref >60)
Glucose, Bld: 135 mg/dL — ABNORMAL HIGH (ref 70–99)
Potassium: 3.9 mmol/L (ref 3.5–5.1)
Sodium: 130 mmol/L — ABNORMAL LOW (ref 135–145)

## 2024-04-28 LAB — GLUCOSE, CAPILLARY
Glucose-Capillary: 261 mg/dL — ABNORMAL HIGH (ref 70–99)
Glucose-Capillary: 164 mg/dL — ABNORMAL HIGH (ref 70–99)
Glucose-Capillary: 132 mg/dL — ABNORMAL HIGH (ref 70–99)
Glucose-Capillary: 256 mg/dL — ABNORMAL HIGH (ref 70–99)

## 2024-04-28 MED ORDER — INSULIN ASPART 100 UNIT/ML IJ SOLN
0.0000 [IU] | Freq: Three times a day (TID) | INTRAMUSCULAR | Status: DC
  Administered 2024-04-28: 1 [IU] via SUBCUTANEOUS
  Administered 2024-04-29: 2 [IU] via SUBCUTANEOUS
  Administered 2024-04-30 (×2): 1 [IU] via SUBCUTANEOUS
  Administered 2024-05-01: 2 [IU] via SUBCUTANEOUS

## 2024-04-28 NOTE — Inpatient Diabetes Management (Signed)
 Inpatient Diabetes Program Recommendations  AACE/ADA: New Consensus Statement on Inpatient Glycemic Control (2015)  Target Ranges:  Prepandial:   less than 140 mg/dL      Peak postprandial:   less than 180 mg/dL (1-2 hours)      Critically ill patients:  140 - 180 mg/dL   Lab Results  Component Value Date   GLUCAP 261 (H) 04/28/2024   HGBA1C 8.5 (H) 04/24/2024    Review of Glycemic Control  Latest Reference Range & Units 04/27/24 07:44 04/27/24 11:26 04/27/24 16:17 04/27/24 16:43 04/27/24 21:12 04/28/24 08:55  Glucose-Capillary 70 - 99 mg/dL 161 (H)  Novolog 2 units 332 (H)  Novolog 7 units 56 (L) 103 (H) 173 (H) 261 (H)   Diabetes history: DM 2 Outpatient Diabetes medications: metformin  1000 mg Daily Current orders for Inpatient glycemic control:  Farxiga 10 mg Daily Novolog 0-9 units tid  Inpatient Diabetes Program Recommendations:    -   Reduce Novolog correction scale to 0-6 units tid starting at 151 mg/dl. -   Add Novolog hs scale  Thanks,  Eloise Hake RN, MSN, BC-ADM Inpatient Diabetes Coordinator Team Pager [phone] (8a-5p)

## 2024-04-28 NOTE — Progress Notes (Signed)
 Progress Note  Patient Name: Laura [name] Date of Encounter: 04/28/2024  Primary Cardiologist: None   Subjective   Patient seen examined her bedside.  Interpreter in the room and family in room  Inpatient Medications    Scheduled Meds:  aspirin   81 mg Oral Daily   atorvastatin   80 mg Oral QHS   carvedilol  6.25 mg Oral BID WC   cholecalciferol  1,000 Units Oral Daily   clopidogrel   75 mg Oral Daily   dapagliflozin propanediol  10 mg Oral Daily   doxycycline  100 mg Oral Q12H   enoxaparin  (LOVENOX ) injection  40 mg Subcutaneous Q24H   fluticasone  2 spray Each Nare Daily   furosemide  40 mg Intravenous BID   insulin aspart  0-9 Units Subcutaneous TID WC   levETIRAcetam   500 mg Oral BID   levothyroxine   75 mcg Oral QAC breakfast   loratadine   10 mg Oral q AM   polyethylene glycol  17 g Oral Daily   potassium chloride   40 mEq Oral Daily   rOPINIRole  2 mg Oral QHS   sacubitril-valsartan  1 tablet Oral BID   senna-docusate  1 tablet Oral BID   spironolactone  12.5 mg Oral Daily   Continuous Infusions:  PRN Meds: acetaminophen  **OR** acetaminophen , albuterol, labetalol , lip balm, naphazoline-glycerin, ondansetron  **OR** ondansetron  (ZOFRAN ) IV, mouth rinse   Vital Signs    Vitals:   04/27/24 1305 04/27/24 2015 04/28/24 0208 04/28/24 0841  BP: (!) 159/86 (!) 148/109 129/60 (!) 130/54  Pulse: 77 78 72 (!) 54  Resp: 20 20 15 18   Temp: 98 F (36.7 C)  97.7 F (36.5 C) 98 F (36.7 C)  TempSrc: Oral  Oral Oral  SpO2: 97% 100% 100%   Weight:      Height:        Intake/Output Summary (Last 24 hours) at 04/28/2024 0852 Last data filed at 04/28/2024 0800 Gross per 24 hour  Intake 838 ml  Output 2700 ml  Net -1862 ml   Filed Weights   04/25/24 0103 04/26/24 0500  Weight: 68.2 kg 66.7 kg    Telemetry     - Personally Reviewed  ECG     - Personally Reviewed  Physical Exam     General: Comfortable Head: Atraumatic, normal size  Eyes: PEERLA, EOMI   Neck: Supple, normal JVD Cardiac: Normal S1, S2; RRR; no murmurs, rubs, or gallops Lungs: Clear to auscultation bilaterally Abd: Soft, nontender, no hepatomegaly  Ext: warm, bilateral lower extremity edema Musculoskeletal: No deformities, BUE and BLE strength normal and equal Skin: Warm and dry, no rashes   Neuro: Alert and oriented to person, place, time, and situation, CNII-XII grossly intact, no focal deficits  Psych: Normal mood and affect   Labs    Chemistry Recent Labs  Lab 04/24/24 1157 04/25/24 0552 04/26/24 0816 04/27/24 0433 04/28/24 0359  NA 127* 128* 130* 129* 130*  K 3.9 3.6 4.1 4.3 3.9  CL 89* 87* 91* 87* 89*  CO2 26 32 34* 34* 32  GLUCOSE 107* 128* 171* 161* 135*  BUN 17 15 20 22 21   CREATININE 0.85 0.71 0.88 0.83 0.85  CALCIUM  8.7* 8.5* 7.9* 8.7* 8.7*  PROT 7.2 6.8  --   --   --   ALBUMIN 3.7 3.5  --   --   --   AST 27 22  --   --   --   ALT 30 27  --   --   --  ALKPHOS 83 78  --   --   --   BILITOT 1.0 0.9  --   --   --   GFRNONAA >60 >60 >60 >60 >60  ANIONGAP 12 9 5 8 9      Hematology Recent Labs  Lab 04/26/24 0430 04/27/24 0433 04/28/24 0359  WBC 7.9 10.2 8.2  RBC 4.54 4.54 4.82  HGB 11.5* 11.2* 12.0  HCT 37.4 38.1 40.1  MCV 82.4 83.9 83.2  MCH 25.3* 24.7* 24.9*  MCHC 30.7 29.4* 29.9*  RDW 15.0 15.0 14.7  PLT 332 395 391    Cardiac EnzymesNo results for input(s): "TROPONINI" in the last 168 hours. No results for input(s): "TROPIPOC" in the last 168 hours.   BNP Recent Labs  Lab 04/24/24 1157  BNP 659.6*     DDimer No results for input(s): "DDIMER" in the last 168 hours.   Radiology    ECHOCARDIOGRAM COMPLETE Result Date: 04/26/2024    ECHOCARDIOGRAM REPORT   Patient Name:   CRISTINE DAW Date of Exam: 04/26/2024 Medical Rec #:  [id number]       Height:       62.0 in Accession #:    [id number]      Weight:       147.1 lb Date of Birth:  [birth year]       BSA:          1.678 m Patient Age:    83 years        BP:           141/66  mmHg Patient Gender: F               HR:           81 bpm. Exam Location:  Inpatient Procedure: 2D Echo, Color Doppler and Cardiac Doppler (Both Spectral and Color            Flow Doppler were utilized during procedure). Indications:    CHF  History:        Patient has prior history of Echocardiogram examinations, most                 recent 07/15/2018. Risk Factors:Diabetes.  Sonographer:    Jeralene Mom Referring Phys: [id number] DAVID MANUEL ORTIZ IMPRESSIONS  1. Septal dyssynerg in setting of LBBB. Left ventricular ejection fraction, by estimation, is 30 to 35%. The left ventricle has moderately decreased function. The left ventricle demonstrates global hypokinesis. There is mild concentric left ventricular hypertrophy. Left ventricular diastolic parameters are consistent with Grade I diastolic dysfunction (impaired relaxation).  2. Right ventricular systolic function is moderately reduced. The right ventricular size is normal.  3. Left atrial size was mildly dilated.  4. Right atrial size was mildly dilated.  5. The mitral valve is degenerative. Mild mitral valve regurgitation. No evidence of mitral stenosis.  6. The aortic valve is tricuspid. There is mild calcification of the aortic valve. Aortic valve regurgitation is not visualized. Aortic valve sclerosis/calcification is present, without any evidence of aortic stenosis.  7. The inferior vena cava is normal in size with greater than 50% respiratory variability, suggesting right atrial pressure of 3 mmHg. FINDINGS  Left Ventricle: Septal dyssynerg in setting of LBBB. Left ventricular ejection fraction, by estimation, is 30 to 35%. The left ventricle has moderately decreased function. The left ventricle demonstrates global hypokinesis. The left ventricular internal  cavity size was normal in size. There is mild concentric left ventricular hypertrophy. Abnormal (paradoxical) septal motion, consistent with left  bundle branch block. Left ventricular diastolic  parameters are consistent with Grade I diastolic dysfunction (impaired relaxation). Right Ventricle: The right ventricular size is normal. No increase in right ventricular wall thickness. Right ventricular systolic function is moderately reduced. Left Atrium: Left atrial size was mildly dilated. Right Atrium: Right atrial size was mildly dilated. Pericardium: There is no evidence of pericardial effusion. Mitral Valve: The mitral valve is degenerative in appearance. Mild mitral valve regurgitation. No evidence of mitral valve stenosis. Tricuspid Valve: The tricuspid valve is normal in structure. Tricuspid valve regurgitation is mild . No evidence of tricuspid stenosis. Aortic Valve: The aortic valve is tricuspid. There is mild calcification of the aortic valve. Aortic valve regurgitation is not visualized. Aortic valve sclerosis/calcification is present, without any evidence of aortic stenosis. Pulmonic Valve: The pulmonic valve was normal in structure. Pulmonic valve regurgitation is not visualized. No evidence of pulmonic stenosis. Aorta: The aortic root is normal in size and structure. Venous: The inferior vena cava is normal in size with greater than 50% respiratory variability, suggesting right atrial pressure of 3 mmHg. IAS/Shunts: No atrial level shunt detected by color flow Doppler.   LV Volumes (MOD) LV vol d, MOD A2C: 94.2 ml LV vol d, MOD A4C: 95.3 ml LV vol s, MOD A2C: 56.0 ml LV vol s, MOD A4C: 51.6 ml LV SV MOD A2C:     38.2 ml LV SV MOD A4C:     95.3 ml LV SV MOD BP:      38.9 ml Jules Oar MD Electronically signed by Jules Oar MD Signature Date/Time: 04/26/2024/8:22:14 PM    Final    CT HEAD WO CONTRAST ( ) Result Date: 04/26/2024 CLINICAL DATA:  Delirium, stroke-like symptoms. Left-sided facial droop and lethargy. EXAM: CT HEAD WITHOUT CONTRAST TECHNIQUE: Contiguous axial images were obtained from the base of the skull through the vertex without intravenous contrast. RADIATION DOSE  REDUCTION: This exam was performed according to the departmental dose-optimization program which includes automated exposure control, adjustment of the mA and/or kV according to patient size and/or use of iterative reconstruction technique. COMPARISON:  CT head 05/13/2019. FINDINGS: Brain: No acute intracranial hemorrhage. No CT evidence of acute infarct. Encephalomalacia in the left temporal lobe suggestive of remote infarct. Additional remote infarct involving the left basal ganglia. Nonspecific hypoattenuation in the periventricular and subcortical white matter favored to reflect chronic microvascular ischemic changes. Mild parenchymal volume loss. No edema, mass effect, or midline shift. The basilar cisterns are patent. Ventricles: Ex vacuo dilatation of the left lateral ventricle. Vascular: Atherosclerotic calcifications of the carotid siphons. No hyperdense vessel. Skull: No acute or aggressive finding. Orbits: Orbits are symmetric. Sinuses: Mild mucosal thickening in the ethmoid and maxillary sinuses. Other: Mastoid air cells are clear. IMPRESSION: No CT evidence of acute intracranial abnormality. Similar encephalomalacia in the left occipital lobe. Chronic microvascular ischemic changes. Electronically Signed   By: Denny Flack M.D.   On: 04/26/2024 16:59   DG Chest Port 1 View Result Date: 04/26/2024 CLINICAL DATA:  10026 Shortness of breath 786.05.ICD-9-CM EXAM: PORTABLE CHEST - 1 VIEW COMPARISON:  April 24, 2024 FINDINGS: No focal airspace consolidation, pleural effusion, or pneumothorax. Unchanged moderate to severe cardiomegaly. Tortuosity/ectasia of the aorta, unchanged. Prominence of the right hilar region, likely due to patient's positioning and tortuosity of the aorta. No acute fracture or destructive lesions. Multilevel thoracic osteophytosis. Remote right rib fractures. IMPRESSION: Unchanged cardiomegaly.  No pneumonia or pulmonary edema. Electronically Signed   By: Rance Burrows M.D.   On:  04/26/2024 13:44    Cardiac Studies   Echo reviewed   Patient Profile     83 y.o. 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  Continue Lasix 40 mg IV twice daily.  At a negative balance    Unfortunately her EF is depressed 30 to 35%, discussed with her POA and her cousin yesterday, it will be best avoid invasive procedures and pursue medical therapy in this patient. We have optimized her regimen from a GDMT standpoint and she is tolerating this so far. Blood pressure and cr are all within target.  Continue all medications at  current doses.    Her power of attorney and family member was at the bedside and had a lot of questions about palliative care and CODE STATUS at the best explained to them.  They also were wondering about her functional capacity I do think that she would benefit from physical therapy and occupational therapies evaluation once she has been effectively diuresed.  Will defer to the hospitalist team to get this arranged.   For questions or updates, please contact [facility] Please consult [email or web address] for contact info under Cardiology/STEMI.      Signed, Conny Situ, DO  04/28/2024, 8:52 AM

## 2024-04-28 NOTE — Progress Notes (Signed)
 Daily Progress Note   Patient Name: Laura [name]       Date: 04/28/2024 DOB: 11-12-1941  Age: 83 y.o. MRN#: [id number] Attending Physician: Lesa Rape, MD Primary Care Physician: Helyn Lobstein, MD Admit Date: 04/24/2024 Length of Stay: 3 days  Reason for Consultation/Follow-up: Establishing goals of care  Subjective:   Reviewed EMR prior to presenting to the bedside including recent documentation from hospitalist and PT/OT.   Upon initially presenting to bedside, in person interpreter present.  Patient's HCPOA-Laura not present.  Inpatient interpreter noted that she would reach out to [facility] regarding discussion with palliative medicine provider.  During this visit patient sitting up in chair attempting word search and appeared comfortable. Discussed care with RN for updates.  In afternoon informed patient's HCPOA, Laura [name], present at bedside.  When presenting to bedside, patient calmly sleeping so did not awaken.  This provider and patient's HCPOA, Laura [name], were able to speak privately in separate area.  Spent extensive time learning about patient's medical history.  Patient essentially has capacity of approximately 12-23-year-old according to [facility].  Patient has undergone multiple social struggles with parents passing away.  Patient does have cousins though they are not involved with her medical care.  Laura [name] notes she is patient's documented HCPOA.  Patient's cousins are patient's POA.  Acknowledges this. Spent time discussing pathways for medical care moving forward.  With patient's underlying cognitive impairment, it is incredibly difficult for patient to be in the hospital and to undergo aggressive medical interventions as she has difficulties processing what is occurring and tolerating painful interventions.  According to Laura [name] patient's cognitive status has also declined in the past month.  Patient not able to continue constructive building with physical therapy with worsening cognitive  status.  Laura [name] noted that primary goal for medical care would be to allow patient quality of life for what ever time she does have.  For patient quality life would be in a safe environment where she can be calm and supported with regular caregivers.  Quality of life would not be patient coming back to the hospital for aggressive medical interventions.  Reviewed patient needing long-term care support.  Discussed if goal is for patient to have quality time outside of the hospital, read recommend involvement of hospice to assist with symptom management and long-term care setting.  Laura [name] agreement with this plan.  Also discussed patient's CODE STATUS and based on goals for patient's medical care, patient being appropriately changed to DNR/DNI.  Laura [name] planning to reach out to facility about long-term care placement.  TOC involved to assist with hospice support.  Continuing appropriate medical management at this time to optimize HFrEF.  All questions answered at that time.  Provided emotional support via active listening.  Notified medicine team continue to follow patient's medical treatment.  Objective:   Vital Signs:  BP 129/60 (BP Location: Right Arm)   Pulse 72   Temp 97.7 F (36.5 C) (Oral)   Resp 15   Ht 5\' 2"  (1.575 m)   Wt 66.7 kg   SpO2 100%   BMI 26.90 kg/m   Physical Exam: General: NAD, awake, sitting in bedside chair chronically ill-appearing Cardiovascular: RRR Respiratory: no increased work of breathing noted, not in respiratory distress Neuro: Awake, interactive   Assessment & Plan:   Assessment: Patient is an 83 year old female with a past medical history of cognitive impairment/autism spectrum, generalized anxiety disorder, bilateral deafness, hyperlipidemia, hypertension, hypothyroidism, RLS, CVA, seizure disorder, CKD, diabetes mellitus who was admitted on 04/24/2024  for management of lower extremity swelling, dyspnea on exertion, and abdominal distention. Patient normally  resides in ALF retirement community with caregiver/HCPOA. Patient communicates with sign language and previously was able to ambulate with help of the walker. During hospitalization, patient found to have acute on chronic heart failure. Prior EF had shown diastolic heart failure with EF of 60% though repeat echo now showing EF 30-35%. Cardiology consulted for recommendations. Palliative medicine team consulted to assist with complex medical decision making.   Recommendations/Plan: # Complex medical decision making/goals of care:  - Patient unable to participate in complex medical decision making due to underlying medical status.  - Discussed care extensively with patient's HCPOA/interpreter, Laura [name], as detailed above in HPI.  Discussed pathways for medical care moving forward.  Patient has capacity of approximately 67-42-year-old if this.  Patient is scared by aggressive medical interventions in the hospital. Laura [name] notes quality time for patient moving forward would be in a long-term care facility to establish routine in people she is familiar with and not returning to the hospital for further medical interventions.  At this time HCPOA reaching out about possible long-term care placement.  Plan to involve hospice to assist with symptoms once patient is in long-term care due to patient having multiple underlying medical comorbidities and not planning to return to the hospital for interventions..  -  Code Status: Limited: Do not attempt resuscitation (DNR) -DNR-LIMITED -Do Not Intubate/DNI   # Psychosocial Support:  -HCPOA- Laura [name]   # Discharge Planning: To Be Determined  - Patient's HCPOA reaching out to long-term care facility about care.  Plan to involve hospice at long-term care facility.  Discussed with: HCPOA/interpreter-Laura [name], hospitalist, TOC, and RN  Thank you for allowing the palliative care team to participate in the care Laura [name].  Laura Libel, DO Palliative Care  Provider PMT # [phone]  If patient remains symptomatic despite maximum doses, please call PMT at [phone] between 0700 and 1900. Outside of these hours, please call attending, as PMT does not have night coverage.   Personally spent 65 minutes in patient care including extensive chart review (labs, imaging, progress/consult notes, vital signs), medically appropraite exam, discussed with treatment team, education to patient, family, and staff, documenting clinical information, medication review and management, coordination of care, and available advanced directive documents.

## 2024-04-28 NOTE — Evaluation (Addendum)
 Occupational Therapy Evaluation Patient Details Name: Laura [name] MRN: [id number] DOB: 1941-04-07 Today's Date: 04/28/2024   History of Present Illness   Laura [name] is an 83 year old female, resident of retirement community, lives with caregiver/HCPOA, ambulates with the help of a walker, communicates by sign language, medical history significant for cognitive impairment/autism spectrum, generalized anxiety disorder, bilateral deafness, HLD, HTN, hypothyroid, RLS, stroke, seizure disorder, stage IIIa CKD, DM 2 presented to the ED on 6/2 due to complaints of lower extremity swelling, dyspnea on exertion, abdominal distention and admitted for Acute on chronic diastolic CHF     Clinical Impressions PTA, patient was residing in ALF receiving some support for ADL's and ambulation with rollator, and has a POA who assists with outings as per ASL Interpreter present and taking info from patient and past knowledge. Currently, patient presents with deficits outlined below (see OT Problem List for details) most significantly use of O2 (see below as patient with sats 97-98% on RA after amb), decreased activity tolerance and balance impacting ADL's and mobility and with baseline cognitive/communication deficits. OT will continue to follow while on Acute hospital unit to progress function and safety. Will provide rollator use as patient requested as per baseline AD. No follow up OT needs anticipated with ALF staff assist and support.      If plan is discharge home, recommend the following:   A little help with walking and/or transfers;A little help with bathing/dressing/bathroom;Assistance with cooking/housework;Direct supervision/assist for medications management;Direct supervision/assist for financial management;Assist for transportation;Help with stairs or ramp for entrance;Supervision due to cognitive status     Functional Status Assessment   Patient has had a recent decline in their functional  status and demonstrates the ability to make significant improvements in function in a reasonable and predictable amount of time.     Equipment Recommendations   None recommended by OT      Precautions/Restrictions   Precautions Precautions: Fall Restrictions Weight Bearing Restrictions Per Provider Order: No     Mobility Bed Mobility Overal bed mobility:  (patient in recliner)                  Transfers Overall transfer level: Needs assistance Equipment used: Rolling walker (2 wheels) Transfers: Sit to/from Stand, Bed to chair/wheelchair/BSC Sit to Stand: Contact guard assist     Step pivot transfers: Contact guard assist     General transfer comment: see PT for details for amb in hallway with RW      Balance Overall balance assessment: Mild deficits observed, not formally tested                                         ADL either performed or assessed with clinical judgement   ADL Overall ADL's : Needs assistance/impaired Eating/Feeding: Set up;Sitting   Grooming: Wash/dry hands;Wash/dry face;Set up;Sitting   Upper Body Bathing: Contact guard assist;Sitting   Lower Body Bathing: Sit to/from stand;Minimal assistance   Upper Body Dressing : Contact guard assist   Lower Body Dressing: Minimal assistance;Sit to/from stand (functional reach to above feet)   Toilet Transfer: Contact guard assist;Rolling walker (2 wheels)   Toileting- Clothing Manipulation and Hygiene: Minimal assistance;Sit to/from stand       Functional mobility during ADLs: Rolling walker (2 wheels);Contact guard assist General ADL Comments: requires support and cues for safety and sequcnign, decreased functional reach for LB self care  Vision Baseline Vision/History: 1 Wears glasses Ability to See in Adequate Light: 0 Adequate Patient Visual Report: No change from baseline Vision Assessment?: Wears glasses for reading;No apparent visual deficits             Pertinent Vitals/Pain Pain Assessment Pain Assessment: Faces Faces Pain Scale: No hurt     Extremity/Trunk Assessment Upper Extremity Assessment Upper Extremity Assessment: Right hand dominant;Overall [facility] for tasks assessed   Lower Extremity Assessment Lower Extremity Assessment: Overall WFL for tasks assessed   Cervical / Trunk Assessment Cervical / Trunk Assessment: Normal   Communication Communication Communication: Impaired Factors Affecting Communication: Hearing impaired;Other (comment) (deaf, uses sign language interpreter, Amy present in room for session today)   Cognition Arousal: Alert Behavior During Therapy: WFL for tasks assessed/performed Cognition: History of cognitive impairments             OT - Cognition Comments: paitent with ASD dx, as per assessment tends to be tangential and focussed on simple needs, expresses basic needs via sign language and use of white board, decreased insight, and requires S for safety                 Following commands: Intact       Cueing  General Comments   Cueing Techniques: Gestural cues;Tactile cues;Visual cues  mild LE edema , scabs from reportedly picking at skin, SpO2 98% on 2 ltrs, removed for hallway amb and SpO2 on RA at 97%, nursing made aware           Home Living Family/patient expects to be discharged to:: Assisted living                             Home Equipment: Rolling Walker (2 wheels);Educational psychologist (4 wheels)   Additional Comments: patient is a limited historian and need for interpreter thus  unclear      Prior Functioning/Environment Prior Level of Function : Needs assist  Cognitive Assist : Mobility (cognitive);ADLs (cognitive)     Physical Assist : Mobility (physical);ADLs (physical)     Mobility Comments: prefers rollator, Amalia Badder takes pt out into community      OT Problem List: Decreased activity tolerance;Impaired balance (sitting and/or  standing);Decreased cognition;Decreased safety awareness;Decreased knowledge of use of DME or AE;Decreased knowledge of precautions;Cardiopulmonary status limiting activity;Increased edema   OT Treatment/Interventions: Self-care/ADL training;Therapeutic exercise;Energy conservation;Neuromuscular education;DME and/or AE instruction;Therapeutic activities;Cognitive remediation/compensation;Patient/family education;Balance training      OT Goals(Current goals can be found in the care plan section)   Acute Rehab OT Goals Patient Stated Goal: unable to state goal OT Goal Formulation: Patient unable to participate in goal setting Time For Goal Achievement: 05/12/24 Potential to Achieve Goals: Good ADL Goals Pt Will Perform Grooming: with supervision;standing Pt Will Transfer to Toilet: with supervision;ambulating Pt/caregiver will Perform Home Exercise Program: With Supervision;Increased strength;Both right and left upper extremity Additional ADL Goal #1: Patient will standing for light leisure (puzzle)  or ADL activity up to 5 minutes x 3 sets within session with + VSR   OT Frequency:  Min 2X/week    Co-evaluation PT/OT/SLP Co-Evaluation/Treatment: Yes Reason for Co-Treatment: Necessary to address cognition/behavior during functional activity;To address functional/ADL transfers PT goals addressed during session: Mobility/safety with mobility OT goals addressed during session: ADL's and self-care      AM-PAC OT "6 Clicks" Daily Activity     Outcome Measure Help from another person eating meals?: None Help from another person taking care of  personal grooming?: A Little Help from another person toileting, which includes using toliet, bedpan, or urinal?: A Little Help from another person bathing (including washing, rinsing, drying)?: A Little Help from another person to put on and taking off regular upper body clothing?: A Little Help from another person to put on and taking off regular  lower body clothing?: A Little 6 Click Score: 19   End of Session Equipment Utilized During Treatment: Gait belt;Rolling walker (2 wheels);Oxygen Nurse Communication: Mobility status;Other (comment) (O2 left off based on + sats with nursing in agreement)  Activity Tolerance: Patient tolerated treatment well Patient left: in chair;with call bell/phone within reach;with chair alarm set  OT Visit Diagnosis: Unsteadiness on feet (R26.81);Cognitive communication deficit (R41.841)                Time: 7829-5621 OT Time Calculation (min): 28 min Charges:  OT General Charges $OT Visit: 1 Visit OT Evaluation $OT Eval Low Complexity: 1 Low OT Treatments $Therapeutic Activity: 8-22 mins  Niguel Moure OT/L Acute Rehabilitation Department  [phone]  04/28/2024, 3:25 PM

## 2024-04-28 NOTE — Plan of Care (Signed)
   Problem: Education: Goal: Ability to describe self-care measures that may prevent or decrease complications (Diabetes Survival Skills Education) will improve Outcome: Progressing Goal: Individualized Educational Video(s) Outcome: Progressing   Problem: Coping: Goal: Ability to adjust to condition or change in health will improve Outcome: Progressing

## 2024-04-28 NOTE — Progress Notes (Signed)
 PROGRESS NOTE ZERIAH BAYSINGER  UJW:[id number] DOB: 1941-05-26 DOA: 04/24/2024 PCP: Helyn Lobstein, MD  Brief Narrative/Hospital Course: 83 year old female, resident of retirement community, lives with caregiver/HCPOA, ambulates with the help of a walker, communicates by sign language, medical history significant for cognitive impairment/autism spectrum, generalized anxiety disorder, bilateral deafness, HLD, HTN, hypothyroid, RLS, stroke, seizure disorder, stage IIIa CKD, DM 2 presented to the ED on 6/2 due to complaints of lower extremity swelling, dyspnea on exertion, abdominal distention.  Limited history due to cognitive impairment and language/communication barrier on admission.  Admitted for suspected new onset acute CHF, started on IV Lasix. Cardiology consulted.   Subjective: Patient seen and examined this morning  Her cousin and the interpreter in the room  Alert awake resting comfortably denies any shortness of breath feels much improved overall Leg swelling improving but still swollen Labs stable with mild hyponatremia Assessment and plan:  Acute on chronic diastolic CHF: Presenting with progressive shortness of breath, looks swelling pain BNP elevated 659 CT chest edema versus air trapping versus airspace infiltrate. Echo 07/15/2018 showed LVEF of 55-60% and  G1DD> TTE 6/4-LBBB. Lvef  is 30-35%,decreased function, GHKN, Mild concentric lvh G1DD Appreciate cardiology input-still volume overloaded Cont on IV Lasix 40 twice daily, GDMT:Aldactone, Farxiga, Entresto- cont to adjust meds. Monitor intake output Daily weight electrolytes, Keep on  salt/fluid restricted diet and monitor in tele. Net IO Since Admission: -3,935 mL [04/28/24 1105]  Filed Weights   04/25/24 0103 04/26/24 0500  Weight: 68.2 kg 66.7 kg    Recent Labs  Lab 04/24/24 1157 04/25/24 0552 04/26/24 0816 04/27/24 0433 04/28/24 0359  BNP 659.6*  --   --   --   --   BUN 17 15 20 22 21   CREATININE 0.85 0.71 0.88  0.83 0.85  K 3.9 3.6 4.1 4.3 3.9   Bilateral leg cellulitis, left > right: In the setting of fluid overload and also has tendency to pick up causing wounds. Multiple scabs and also had wheezing previously.  Doppler negative for DVT.   Complete doxycycline x 5 days   Hypovolemic hyponatremia: Level improving, continue diuresis    Normocytic anemia May be anemia of chronic disease.  Monitor.     Hypothyroidism Last TSH in 2019: 12.31.  TSH slightly up 7.9, continue home Synthroid   Acute metabolic encephalopathy Chronic hypercapnia PCO2 58-69-ph stable Cognitive impairment/autism spectrum/bilateral deafness History of stroke/Seizure Patient was lethargic difficult to arouse subsequently improved 6/4 and was seen at the bedside no focal weakness> had CT scan of the head 6/4 no new changes.  She is mobilizing well does have left eye lid issues chronic and does not close properly. Cont supportive care Continue her Keppra  Requip and her Asa 81 Mg daily + Plavix  75  and statins.   Seizure disorder Continue Keppra   Dyslipidemia Continue atorvastatin  80 mg daily   Essential hypertension BP fairly well-controlled continue Coreg, Aldactone GDMT   Type II DM with controlled hyperglycemia A1c 8.5 on 04/24/2024.=> Decrease correctional scale to 0 to 6 units  cont to hold metformin  while hospitalized continue sliding scale insulin.  Continue Farxiga, noted hypoglycemia 6/5 monitor Recent Labs  Lab 04/24/24 2104 04/25/24 0728 04/27/24 1126 04/27/24 1617 04/27/24 1643 04/27/24 2112 04/28/24 0855  GLUCAP  --    < > 332* 56* 103* 173* 261*  HGBA1C 8.5*  --   --   --   --   --   --    < > = values in this interval not displayed.  RLS Continue Requip   Generalized anxiety disorder Does not appear to be on any medications PTA.  Monitor.   Goals of care: Patient with poor functional status, now with systolic CHF and dysfunction multiple complex comorbidities she would benefit  palliative care evaluation and has been consulted currently remains full code encouraged to discuss with patient and HCPOA   DVT prophylaxis: enoxaparin  (LOVENOX ) injection 40 mg Start: 04/24/24 2200 Code Status:   Code Status: Full Code Family Communication: plan of care discussed with patient/family , HCPOA at bedside. Patient status is: Remains hospitalized because of severity of illness Level of care: Progressive   Dispo: The patient is from: home            Anticipated disposition: TBD Objective: Vitals last 24 hrs: Vitals:   04/27/24 1305 04/27/24 2015 04/28/24 0208 04/28/24 0841  BP: (!) 159/86 (!) 148/109 129/60 (!) 130/54  Pulse: 77 78 72 (!) 54  Resp: 20 20 15 18   Temp: 98 F (36.7 C)  97.7 F (36.5 C) 98 F (36.7 C)  TempSrc: Oral  Oral Oral  SpO2: 97% 100% 100%   Weight:      Height:        Physical Examination: General exam: alert awake, oriented communicative with sign language  HEENT:Oral mucosa moist, Ear/Nose WNL grossly Respiratory system: Bilaterally clear BS,no use of accessory muscle Cardiovascular system: S1 & S2 +, No JVD. Gastrointestinal system: Abdomen soft,NT,ND, BS+ Nervous System: Alert, awake, moving all extremities,and following commands. Extremities: LE edema present but with improvement in the skin wrinkling, mild erythema Skin: No rashes,no icterus. MSK: Normal muscle bulk,tone, power  Data Reviewed: I have personally reviewed following labs and imaging studies ( see epic result tab) CBC: Recent Labs  Lab 04/24/24 1157 04/25/24 0552 04/26/24 0430 04/27/24 0433 04/28/24 0359  WBC 10.1 7.9 7.9 10.2 8.2  NEUTROABS 8.0*  --   --   --   --   HGB 11.7* 11.3* 11.5* 11.2* 12.0  HCT 38.4 37.3 37.4 38.1 40.1  MCV 81.9 82.5 82.4 83.9 83.2  PLT 381 364 332 395 391   CMP: Recent Labs  Lab 04/24/24 1157 04/25/24 0552 04/26/24 0816 04/27/24 0433 04/28/24 0359  NA 127* 128* 130* 129* 130*  K 3.9 3.6 4.1 4.3 3.9  CL 89* 87* 91* 87* 89*   CO2 26 32 34* 34* 32  GLUCOSE 107* 128* 171* 161* 135*  BUN 17 15 20 22 21   CREATININE 0.85 0.71 0.88 0.83 0.85  CALCIUM  8.7* 8.5* 7.9* 8.7* 8.7*   GFR: Estimated Creatinine Clearance: 46.5 mL/min (by C-G formula based on SCr of 0.85 mg/dL). Recent Labs  Lab 04/24/24 1157 04/25/24 0552  AST 27 22  ALT 30 27  ALKPHOS 83 78  BILITOT 1.0 0.9  PROT 7.2 6.8  ALBUMIN 3.7 3.5   No results for input(s): "LIPASE", "AMYLASE" in the last 168 hours.  Recent Labs  Lab 04/26/24 1820  AMMONIA 13   Coagulation Profile: No results for input(s): "INR", "PROTIME" in the last 168 hours. Unresulted Labs (From admission, onward)     Start     Ordered   04/27/24 0500  Basic metabolic panel with GFR  Daily,   R     Question:  Specimen collection method  Answer:  Lab=Lab collect   04/26/24 1254   04/27/24 0500  CBC  Daily,   R     Question:  Specimen collection method  Answer:  Lab=Lab collect   04/26/24 1254  Antimicrobials/Microbiology: Anti-infectives (From admission, onward)    Start     Dose/Rate Route Frequency Ordered Stop   04/25/24 1000  doxycycline (VIBRA-TABS) tablet 100 mg        100 mg Oral Every 12 hours 04/25/24 0900 04/30/24 0959         Component Value Date/Time   SDES URINE, CLEAN CATCH 07/25/2018 1058   SPECREQUEST  07/25/2018 1058    NONE Performed at [facility], [address], [place], [place] [address]    CULT MULTIPLE SPECIES PRESENT, SUGGEST RECOLLECTION (A) 07/25/2018 1058   REPTSTATUS 07/26/2018 FINAL 07/25/2018 1058    Procedures:  Medications reviewed:  Scheduled Meds:  aspirin   81 mg Oral Daily   atorvastatin   80 mg Oral QHS   carvedilol  6.25 mg Oral BID WC   cholecalciferol  1,000 Units Oral Daily   clopidogrel   75 mg Oral Daily   dapagliflozin propanediol  10 mg Oral Daily   doxycycline  100 mg Oral Q12H   enoxaparin  (LOVENOX ) injection  40 mg Subcutaneous Q24H   fluticasone  2 spray Each Nare Daily   furosemide  40 mg  Intravenous BID   insulin aspart  0-9 Units Subcutaneous TID WC   levETIRAcetam   500 mg Oral BID   levothyroxine   75 mcg Oral QAC breakfast   loratadine   10 mg Oral q AM   polyethylene glycol  17 g Oral Daily   potassium chloride   40 mEq Oral Daily   rOPINIRole  2 mg Oral QHS   sacubitril-valsartan  1 tablet Oral BID   senna-docusate  1 tablet Oral BID   spironolactone  12.5 mg Oral Daily   Continuous Infusions:  Lesa Rape, MD [facility] 04/28/2024, 11:07 AM

## 2024-04-28 NOTE — Evaluation (Signed)
 Physical Therapy Evaluation Patient Details Name: IVANKA KIRSHNER MRN: [id number] DOB: May 21, 1941 Today's Date: 04/28/2024  History of Present Illness  Elia Nunley is an 83 year old female, resident of retirement community, lives with caregiver/HCPOA, ambulates with the help of a walker, communicates by sign language, medical history significant for cognitive impairment/autism spectrum, generalized anxiety disorder, bilateral deafness, HLD, HTN, hypothyroid, RLS, stroke, seizure disorder, stage IIIa CKD, DM 2 presented to the ED on 6/2 due to complaints of lower extremity swelling, dyspnea on exertion, abdominal distention and admitted for Acute on chronic diastolic CHF  Clinical Impression  Patient evaluated by Physical Therapy with no further acute PT needs identified. All education has been completed and the patient has no further questions.  Pt assisted with ambulating in hallway and mostly CGA for cues and line management.  Pt with SPO2 97% on room air during ambulation.  Pt appears close to her mobility baseline.  No f/u Physical Therapy or equipment needs. PT is signing off. Thank you for this referral.  *Dr. Azalea Lento with palliative care reached out after evaluation and reports she spoke with HCPOA-[name].  Plan is for LTC and hospice upon discharge.  PT with sign off.            If plan is discharge home, recommend the following:     Can travel by private vehicle        Equipment Recommendations None recommended by PT  Recommendations for Other Services       Functional Status Assessment Patient has had a recent decline in their functional status and demonstrates the ability to make significant improvements in function in a reasonable and predictable amount of time.     Precautions / Restrictions Precautions Precautions: Fall      Mobility  Bed Mobility               General bed mobility comments: pt in recliner    Transfers Overall transfer level: Needs  assistance Equipment used: Rolling walker (2 wheels) Transfers: Sit to/from Stand Sit to Stand: Contact guard assist           General transfer comment: CGA for safety    Ambulation/Gait Ambulation/Gait assistance: Contact guard assist Gait Distance (Feet): 160 Feet Assistive device: Rolling walker (2 wheels) Gait Pattern/deviations: Step-through pattern, Decreased stride length       General Gait Details: pt prefers rollator but using RW with interpretering assisting with glide; monitored SPO2 and maintained 97% on room air  Stairs            Wheelchair Mobility     Tilt Bed    Modified Rankin (Stroke Patients Only)       Balance Overall balance assessment: No apparent balance deficits (not formally assessed) (uses rollator at baseline)                                           Pertinent Vitals/Pain Pain Assessment Pain Assessment: Faces Faces Pain Scale: No hurt Pain Intervention(s): Monitored during session, Repositioned    Home Living Family/patient expects to be discharged to:: Assisted living                 Home Equipment: Agricultural consultant (2 wheels);Shower seat;Rollator (4 wheels) Additional Comments: patient is a limited historian and need for interpreter thus  unclear    Prior Function Prior Level of Function : Needs assist  Cognitive  Assist : Mobility (cognitive);ADLs (cognitive)     Physical Assist : Mobility (physical);ADLs (physical)     Mobility Comments: prefers rollator, Amalia Badder takes pt out into community       Extremity/Trunk Assessment   Upper Extremity Assessment Upper Extremity Assessment: Right hand dominant;Overall [facility] for tasks assessed    Lower Extremity Assessment Lower Extremity Assessment: Overall WFL for tasks assessed       Communication   Communication Communication: Impaired Factors Affecting Communication: Hearing impaired;Other (comment) (deaf, uses sign language interpreter, Amy  present in room for session today)    Cognition Arousal: Alert Behavior During Therapy: WFL for tasks assessed/performed   PT - Cognitive impairments: History of cognitive impairments                         Following commands: Intact       Cueing       General Comments General comments (skin integrity, edema, etc.): mild LE edema , scabs from reportedly picking at skin, SpO2 98% on 2 ltrs, removed for hallway amb and SpO2 on RA at 97%, nursing    Exercises     Assessment/Plan    PT Assessment Patient needs continued PT services  PT Problem List Cardiopulmonary status limiting activity;Decreased activity tolerance;Decreased mobility       PT Treatment Interventions DME instruction;Gait training;Balance training;Functional mobility training;Therapeutic activities;Therapeutic exercise;Patient/family education    PT Goals (Current goals can be found in the Care Plan section)  Acute Rehab PT Goals PT Goal Formulation: With patient Time For Goal Achievement: 05/12/24 Potential to Achieve Goals: Good    Frequency Min 2X/week     Co-evaluation PT/OT/SLP Co-Evaluation/Treatment: Yes Reason for Co-Treatment: Necessary to address cognition/behavior during functional activity;To address functional/ADL transfers PT goals addressed during session: Mobility/safety with mobility OT goals addressed during session: ADL's and self-care       AM-PAC PT "6 Clicks" Mobility  Outcome Measure Help needed turning from your back to your side while in a flat bed without using bedrails?: A Little Help needed moving from lying on your back to sitting on the side of a flat bed without using bedrails?: A Little Help needed moving to and from a bed to a chair (including a wheelchair)?: A Little Help needed standing up from a chair using your arms (e.g., wheelchair or bedside chair)?: A Little Help needed to walk in hospital room?: A Little Help needed climbing 3-5 steps with a  railing? : A Little 6 Click Score: 18    End of Session Equipment Utilized During Treatment: Gait belt Activity Tolerance: Patient tolerated treatment well Patient left: in chair;with call bell/phone within reach;with chair alarm set Nurse Communication: Mobility status PT Visit Diagnosis: Difficulty in walking, not elsewhere classified (R26.2)    Time: 1610-9604 PT Time Calculation (min) (ACUTE ONLY): 28 min   Charges:   PT Evaluation $PT Eval Low Complexity: 1 Low   PT General Charges $$ ACUTE PT VISIT: 1 Visit       Henretta Lodge PT, DPT Physical Therapist Acute Rehabilitation Services Office: [phone]   Myna Asal Payson 04/28/2024, 3:13 PM

## 2024-04-29 DIAGNOSIS — I509 Heart failure, unspecified: Secondary | ICD-10-CM | POA: Diagnosis not present

## 2024-04-29 DIAGNOSIS — I5020 Unspecified systolic (congestive) heart failure: Secondary | ICD-10-CM

## 2024-04-29 LAB — CBC
HCT: 40.9 % (ref 36.0–46.0)
Hemoglobin: 12.5 g/dL (ref 12.0–15.0)
MCH: 25.2 pg — ABNORMAL LOW (ref 26.0–34.0)
MCHC: 30.6 g/dL (ref 30.0–36.0)
MCV: 82.5 fL (ref 80.0–100.0)
Platelets: 385 10*3/uL (ref 150–400)
RBC: 4.96 MIL/uL (ref 3.87–5.11)
RDW: 15 % (ref 11.5–15.5)
WBC: 8 10*3/uL (ref 4.0–10.5)
nRBC: 0 % (ref 0.0–0.2)

## 2024-04-29 LAB — GLUCOSE, CAPILLARY
Glucose-Capillary: 125 mg/dL — ABNORMAL HIGH (ref 70–99)
Glucose-Capillary: 217 mg/dL — ABNORMAL HIGH (ref 70–99)
Glucose-Capillary: 141 mg/dL — ABNORMAL HIGH (ref 70–99)
Glucose-Capillary: 182 mg/dL — ABNORMAL HIGH (ref 70–99)
Glucose-Capillary: 165 mg/dL — ABNORMAL HIGH (ref 70–99)

## 2024-04-29 LAB — BASIC METABOLIC PANEL WITH GFR
Anion gap: 11 (ref 5–15)
BUN: 26 mg/dL — ABNORMAL HIGH (ref 8–23)
CO2: 29 mmol/L (ref 22–32)
Calcium: 8.8 mg/dL — ABNORMAL LOW (ref 8.9–10.3)
Chloride: 91 mmol/L — ABNORMAL LOW (ref 98–111)
Creatinine, Ser: 0.78 mg/dL (ref 0.44–1.00)
GFR, Estimated: >60 mL/min (ref >60)
Glucose, Bld: 131 mg/dL — ABNORMAL HIGH (ref 70–99)
Potassium: 3.9 mmol/L (ref 3.5–5.1)
Sodium: 131 mmol/L — ABNORMAL LOW (ref 135–145)

## 2024-04-29 MED ORDER — AMLODIPINE BESYLATE 10 MG PO TABS
5.0000 mg | ORAL_TABLET | Freq: Every day | ORAL | Status: DC
  Administered 2024-04-29 – 2024-05-01 (×3): 5 mg via ORAL
  Filled 2024-04-29 (×3): qty 1

## 2024-04-29 MED ORDER — FUROSEMIDE 40 MG PO TABS
40.0000 mg | ORAL_TABLET | Freq: Two times a day (BID) | ORAL | Status: DC
  Administered 2024-04-29 – 2024-04-30 (×3): 40 mg via ORAL
  Filled 2024-04-29 (×3): qty 1

## 2024-04-29 NOTE — Plan of Care (Signed)

## 2024-04-29 NOTE — Progress Notes (Signed)
  Daily Progress Note   Patient Name: Laura [name]       Date: 04/29/2024 DOB: May 17, 1941  Age: 83 y.o. MRN#: [id number] Attending Physician: Barbee Lew, MD Primary Care Physician: Helyn Lobstein, MD Admit Date: 04/24/2024 Length of Stay: 4 days  Reason for Consultation/Follow-up: Establishing goals of care  Subjective:   Reviewed EMR prior to presenting to bedside.  Reviewed recent cardiology and hospitalist note regarding recommendations for medical care.  Presented to bedside to see patient.  When going to bedside, patient up walking around with mobility tech with rollator.  Interpreter present with patient.  Did not interrupt mobility session as patient was enjoying.  Did place signed gold DNR form on patient's paper chart.  Will wait to complete MOST form until patient's HCPOA present at bedside.  Objective:   Vital Signs:  BP 138/78 (BP Location: Right Arm)   Pulse 73   Temp 97.7 F (36.5 C) (Oral)   Resp 17   Ht 5\' 2"  (1.575 m)   Wt 66.7 kg   SpO2 96%   BMI 26.90 kg/m   Physical Exam: General: NAD, awake, chronically ill-appearing, walking in hallway Cardiovascular: RRR Respiratory: no increased work of breathing noted, not in respiratory distress Neuro: Awake, interactive   Assessment & Plan:   Assessment: Patient is an 83 year old female with a past medical history of cognitive impairment/autism spectrum, generalized anxiety disorder, bilateral deafness, hyperlipidemia, hypertension, hypothyroidism, RLS, CVA, seizure disorder, CKD, diabetes mellitus who was admitted on 04/24/2024 for management of lower extremity swelling, dyspnea on exertion, and abdominal distention. Patient normally resides in ALF retirement community with caregiver/HCPOA. Patient communicates with sign language and previously was able to ambulate with help of the walker. During hospitalization, patient found to have acute on chronic heart failure. Prior EF had shown diastolic heart failure  with EF of 60% though repeat echo now showing EF 30-35%. Cardiology consulted for recommendations. Palliative medicine team consulted to assist with complex medical decision making.   Recommendations/Plan: # Complex medical decision making/goals of care:  - Patient unable to participate in complex medical decision making due to underlying medical status.  - Discussed care extensively with patient's HCPOA/interpreter, Bettyjane Brunet, on 04/28/2024.  Discussed pathways for medical care moving forward.  Patient has comprehension of approximately 37-73-year-old if this.  Patient is scared by aggressive medical interventions in the hospital. Amalia Badder notes quality time for patient moving forward would be in a long-term care facility to establish routine in people she is familiar with and not returning to the hospital for further medical interventions.  At this time HCPOA reaching out about possible long-term care placement.  Plan to involve hospice to assist with symptoms once patient is in long-term care due to patient having multiple underlying medical comorbidities and not planning to return to the hospital for interventions..  -  Code Status: Limited: Do not attempt resuscitation (DNR) -DNR-LIMITED -Do Not Intubate/DNI   # Psychosocial Support:  -HCPOA- Bettyjane Brunet   # Discharge Planning: To Be Determined  - Patient's HCPOA reaching out to long-term care facility about care.  Plan to involve hospice at long-term care facility.  Thank you for allowing the palliative care team to participate in the care [name].  Barnett Libel, DO Palliative Care Provider PMT # [phone]  If patient remains symptomatic despite maximum doses, please call PMT at [phone] between 0700 and 1900. Outside of these hours, please call attending, as PMT does not have night coverage.

## 2024-04-29 NOTE — Progress Notes (Signed)
 Progress Note  Patient Name: RAFEEF [name] Date of Encounter: 04/29/2024  Primary Cardiologist: Kardie Tobb, DO  Interval Summary  Chart reviewed.  She continues to diurese on IV Lasix , net urine output of approximately 2700 cc last 24 hours.  Renal function remains stable.  Spoke with patient through sign language interpreter.  Her cousin was also present.  Vital Signs  Vitals:   04/28/24 1146 04/28/24 1945 04/29/24 0509 04/29/24 0900  BP: 106/69 112/71 138/78   Pulse: (!) 50 69 73   Resp: 18 15 17    Temp: 97.9 F (36.6 C) 97.6 F (36.4 C) 97.7 F (36.5 C)   TempSrc: Oral Oral Oral   SpO2: 100% 96% 96%   Weight:    69.2 kg  Height:        Intake/Output Summary (Last 24 hours) at 04/29/2024 1032 Last data filed at 04/29/2024 0828 Gross per 24 hour  Intake 713 ml  Output 3350 ml  Net -2637 ml   Filed Weights   04/25/24 0103 04/26/24 0500 04/29/24 0900  Weight: 68.2 kg 66.7 kg 69.2 kg    Physical Exam  GEN: No acute distress.   Neck: No JVD. Cardiac: RRR, no murmur, rub, or gallop.  Respiratory: Nonlabored. Clear to auscultation bilaterally. MS: Gradually improving peripheral edema.  ECG/Telemetry  Telemetry reviewed, currently in sinus rhythm.  Labs  Chemistry Recent Labs  Lab 04/24/24 1157 04/25/24 0552 04/26/24 0816 04/27/24 0433 04/28/24 0359 04/29/24 0356  NA 127* 128*   < > 129* 130* 131*  K 3.9 3.6   < > 4.3 3.9 3.9  CL 89* 87*   < > 87* 89* 91*  CO2 26 32   < > 34* 32 29  GLUCOSE 107* 128*   < > 161* 135* 131*  BUN 17 15   < > 22 21 26*  CREATININE 0.85 0.71   < > 0.83 0.85 0.78  CALCIUM  8.7* 8.5*   < > 8.7* 8.7* 8.8*  PROT 7.2 6.8  --   --   --   --   ALBUMIN 3.7 3.5  --   --   --   --   AST 27 22  --   --   --   --   ALT 30 27  --   --   --   --   ALKPHOS 83 78  --   --   --   --   BILITOT 1.0 0.9  --   --   --   --   GFRNONAA >60 >60   < > >60 >60 >60  ANIONGAP 12 9   < > 8 9 11    < > = values in this interval not displayed.     Hematology Recent Labs  Lab 04/27/24 0433 04/28/24 0359 04/29/24 0356  WBC 10.2 8.2 8.0  RBC 4.54 4.82 4.96  HGB 11.2* 12.0 12.5  HCT 38.1 40.1 40.9  MCV 83.9 83.2 82.5  MCH 24.7* 24.9* 25.2*  MCHC 29.4* 29.9* 30.6  RDW 15.0 14.7 15.0  PLT 395 391 385   Cardiac Studies  Echocardiogram 04/26/2024:  1. Septal dyssynerg in setting of LBBB. Left ventricular ejection  fraction, by estimation, is 30 to 35%. The left ventricle has moderately  decreased function. The left ventricle demonstrates global hypokinesis.  There is mild concentric left ventricular  hypertrophy. Left ventricular diastolic parameters are consistent with  Grade I diastolic dysfunction (impaired relaxation).   2. Right ventricular systolic function is  moderately reduced. The right  ventricular size is normal.   3. Left atrial size was mildly dilated.   4. Right atrial size was mildly dilated.   5. The mitral valve is degenerative. Mild mitral valve regurgitation. No  evidence of mitral stenosis.   6. The aortic valve is tricuspid. There is mild calcification of the  aortic valve. Aortic valve regurgitation is not visualized. Aortic valve  sclerosis/calcification is present, without any evidence of aortic  stenosis.   7. The inferior vena cava is normal in size with greater than 50%  respiratory variability, suggesting right atrial pressure of 3 mmHg.   Assessment & Plan  1.  Acute HFrEF, echocardiogram this admission demonstrates LVEF 30 to 35% with global hypokinesis and septal motion consistent with left bundle branch block.  RV contraction also moderately reduced.  Clinically improving with IV diuresis and optimization of GDMT.  2.  Primary hypertension.  Recent systolics 120s to 130s.  3.  Mixed hyperlipidemia.  Continue Lipitor  80 mg daily..  4.  History of stroke.  Continues on aspirin  81 mg daily and Plavix  75 mg daily.  No change to present regimen.  Currently on Farxiga  10 mg daily, Coreg  6.25 mg  twice daily, Entresto  24/26 mg twice daily, Aldactone  12.5 mg daily, and IV Lasix  40 mg twice daily with potassium supplement.  Continued IV diuresis through the weekend, reassess BMET in a.m.  Likely nearing discharge from a cardiovascular perspective.  For questions or updates, please contact [facility] Please consult [email or web address] for contact info under   Signed, Teddie Favre, MD  04/29/2024, 10:32 AM

## 2024-04-29 NOTE — Progress Notes (Addendum)
 PROGRESS NOTE    Laura [name]  SWF:[id number] DOB: 04/04/1941 DOA: 04/24/2024 PCP: Helyn Lobstein, MD   Brief Narrative: 83 year old female, resident of retirement community, lives with caregiver/HCPOA, ambulates with the help of a walker, communicates by sign language, medical history significant for cognitive impairment/autism spectrum, generalized anxiety disorder, bilateral deafness, HLD, HTN, hypothyroid, RLS, stroke, seizure disorder, stage IIIa CKD, DM 2 presented to the ED on 6/2 due to complaints of lower extremity swelling, dyspnea on exertion, abdominal distention. Limited history due to cognitive impairment and language/communication barrier on admission. Admitted for suspected new onset acute CHF, started on IV Lasix .   Assessment & Plan:   Principal Problem:   Volume overload Active Problems:   Hypothyroidism   Dyslipidemia   RESTLESS LEG SYNDROME   Essential hypertension   Normocytic anemia   Autism spectrum disorder   Deaf   Diabetes mellitus type 2 in nonobese [facility])   Asymptomatic bacteriuria   Hyponatremia   Cardiomegaly   Acute on chronic systolic heart failure (HCC)   Counseling and coordination of care   Palliative care encounter   DNR (do not resuscitate)   Need for emotional support   Goals of care, counseling/discussion    Acute on chronic diastolic CHF: Presented with progressive shortness of breath, leg swelling pain BNP elevated 659 CT chest edema versus air trapping versus airspace infiltrate. Echo 07/15/2018 showed LVEF of 55-60% and  G1DD> TTE 04/26/24-LBBB. Lvef  is 30-35%,decreased function, GHKN, Mild concentric lvh G1DD Appreciate cardiology input Cont on IV Lasix  40 twice daily, GDMT:Aldactone , Farxiga , Entresto - cont to adjust meds.   Bilateral leg cellulitis, left > right: In the setting of fluid overload  Doppler negative for DVT.   Complete doxycycline  x 5 days   Hypovolemic hyponatremia: Level improving, continue diuresis     Normocytic anemia May be anemia of chronic disease.  Monitor.     Hypothyroidism Last TSH in 2019: 12.31.  TSH slightly up 7.9, continue home Synthroid    Acute metabolic encephalopathy Chronic hypercapnia PCO2 58-69-ph stable Cognitive impairment/autism spectrum/bilateral deafness History of stroke/Seizure Patient was lethargic difficult to arouse subsequently improved 6/4 and was seen at the bedside no focal weakness> had CT scan of the head 6/4 no new changes.  She is mobilizing well does have left eye lid issues chronic and does not close properly. Cont supportive care Continue her Keppra  Requip  and her Asa 81 Mg daily + Plavix  75  and statins.   Seizure disorder Continue Keppra    Dyslipidemia Continue atorvastatin  80 mg daily   Essential hypertension BP remains elevated despite Coreg , Aldactone  entresto  lasix  Added norvasc  (she pulled out her IV access)   Type II DM with controlled hyperglycemia A1c 8.5 on 04/24/2024.=> Decrease correctional scale to 0 to 6 units  cont to hold metformin  while hospitalized continue sliding scale insulin .  Continue Farxiga , noted hypoglycemia 6/5 monitor    RLS Continue Requip    Generalized anxiety disorder Does not appear to be on any medications PTA.  Monitor.   Goals of care: Patient with poor functional status, now with systolic CHF and dysfunction multiple complex comorbidities she would benefit palliative care evaluation and has been consulted She is DNR    Estimated body mass index is 27.9 kg/m as calculated from the following:   Height as of this encounter: 5\' 2"  (1.575 m).   Weight as of this encounter: 69.2 kg.  DVT prophylaxis: Lovenox   code Status: DNR  family Communication: None at bedside today  disposition Plan:  Status is: Inpatient Remains inpatient appropriate because: Acute illness   Consultants:  Cardiology  Procedures: None Antimicrobials: None  Subjective: Seen sitting up in bed interpreter in the room  concerned why she is not getting eyedrops feels breathing has improved  Objective: Vitals:   04/28/24 1146 04/28/24 1945 04/29/24 0509 04/29/24 0900  BP: 106/69 112/71 138/78   Pulse: (!) 50 69 73   Resp: 18 15 17    Temp: 97.9 F (36.6 C) 97.6 F (36.4 C) 97.7 F (36.5 C)   TempSrc: Oral Oral Oral   SpO2: 100% 96% 96%   Weight:    69.2 kg  Height:        Intake/Output Summary (Last 24 hours) at 04/29/2024 1306 Last data filed at 04/29/2024 1610 Gross per 24 hour  Intake 476 ml  Output 2350 ml  Net -1874 ml   Filed Weights   04/25/24 0103 04/26/24 0500 04/29/24 0900  Weight: 68.2 kg 66.7 kg 69.2 kg    Examination:  General exam: Appears in no acute distress Respiratory system: diminished bs Cardiovascular system: reg Gastrointestinal system: Abdomen is nondistended, soft and nontender. No organomegaly or masses felt. Normal bowel sounds heard. Central nervous system: Alert and oriented.  Extremities: Bilateral lower extremity edema  Data Reviewed: I have personally reviewed following labs and imaging studies  CBC: Recent Labs  Lab 04/24/24 1157 04/25/24 0552 04/26/24 0430 04/27/24 0433 04/28/24 0359 04/29/24 0356  WBC 10.1 7.9 7.9 10.2 8.2 8.0  NEUTROABS 8.0*  --   --   --   --   --   HGB 11.7* 11.3* 11.5* 11.2* 12.0 12.5  HCT 38.4 37.3 37.4 38.1 40.1 40.9  MCV 81.9 82.5 82.4 83.9 83.2 82.5  PLT 381 364 332 395 391 385   Basic Metabolic Panel: Recent Labs  Lab 04/25/24 0552 04/26/24 0816 04/27/24 0433 04/28/24 0359 04/29/24 0356  NA 128* 130* 129* 130* 131*  K 3.6 4.1 4.3 3.9 3.9  CL 87* 91* 87* 89* 91*  CO2 32 34* 34* 32 29  GLUCOSE 128* 171* 161* 135* 131*  BUN 15 20 22 21  26*  CREATININE 0.71 0.88 0.83 0.85 0.78  CALCIUM  8.5* 7.9* 8.7* 8.7* 8.8*   GFR: Estimated Creatinine Clearance: 50.2 mL/min (by C-G formula based on SCr of 0.78 mg/dL). Liver Function Tests: Recent Labs  Lab 04/24/24 1157 04/25/24 0552  AST 27 22  ALT 30 27  ALKPHOS  83 78  BILITOT 1.0 0.9  PROT 7.2 6.8  ALBUMIN 3.7 3.5   No results for input(s): "LIPASE", "AMYLASE" in the last 168 hours. Recent Labs  Lab 04/26/24 1820  AMMONIA 13   Coagulation Profile: No results for input(s): "INR", "PROTIME" in the last 168 hours. Cardiac Enzymes: No results for input(s): "CKTOTAL", "CKMB", "CKMBINDEX", "TROPONINI" in the last 168 hours. BNP (last 3 results) No results for input(s): "PROBNP" in the last 8760 hours. HbA1C: No results for input(s): "HGBA1C" in the last 72 hours. CBG: Recent Labs  Lab 04/28/24 1146 04/28/24 1654 04/28/24 2137 04/29/24 0736 04/29/24 1159  GLUCAP 164* 132* 256* 125* 217*   Lipid Profile: No results for input(s): "CHOL", "HDL", "LDLCALC", "TRIG", "CHOLHDL", "LDLDIRECT" in the last 72 hours. Thyroid  Function Tests: Recent Labs    04/26/24 1820  TSH 5.687*   Anemia Panel: Recent Labs    04/26/24 1820  VITAMINB12 208   Sepsis Labs: Recent Labs  Lab 04/24/24 1202  LATICACIDVEN 1.4    No results found for this or any previous  visit (from the past 240 hours).   Radiology Studies: No results found.  Scheduled Meds:  aspirin   81 mg Oral Daily   atorvastatin   80 mg Oral QHS   carvedilol   6.25 mg Oral BID WC   cholecalciferol   1,000 Units Oral Daily   clopidogrel   75 mg Oral Daily   dapagliflozin  propanediol  10 mg Oral Daily   doxycycline   100 mg Oral Q12H   enoxaparin  (LOVENOX ) injection  40 mg Subcutaneous Q24H   fluticasone   2 spray Each Nare Daily   furosemide   40 mg Oral BID   insulin  aspart  0-6 Units Subcutaneous TID WC   levETIRAcetam   500 mg Oral BID   levothyroxine   75 mcg Oral QAC breakfast   loratadine   10 mg Oral q AM   polyethylene glycol  17 g Oral Daily   potassium chloride   40 mEq Oral Daily   rOPINIRole   2 mg Oral QHS   sacubitril -valsartan   1 tablet Oral BID   senna-docusate  1 tablet Oral BID   spironolactone   12.5 mg Oral Daily   Continuous Infusions:   LOS: 4 days    Barbee Lew, MD 04/29/2024, 1:06 PM

## 2024-04-29 NOTE — Progress Notes (Signed)
 Mobility Specialist - Progress Note   04/29/24 1204  Mobility  Activity Ambulated with assistance in hallway  Level of Assistance Contact guard assist, steadying assist  Assistive Device Four wheel walker  Distance Ambulated (ft) 250 ft  Range of Motion/Exercises Active  Activity Response Tolerated well  Mobility Referral Yes  Mobility visit 1 Mobility  Mobility Specialist Start Time (ACUTE ONLY) 1135  Mobility Specialist Stop Time (ACUTE ONLY) 1200  Mobility Specialist Time Calculation (min) (ACUTE ONLY) 25 min   Pt was found sitting EOB and agreeable to ambulate. No complaints with session and at EOS returned to recliner chair with all needs met. Call bell in reach and NT in room.  Lorna Rose,  Mobility Specialist Can be reached via Secure Chat

## 2024-04-30 DIAGNOSIS — E8770 Fluid overload, unspecified: Secondary | ICD-10-CM | POA: Diagnosis not present

## 2024-04-30 LAB — GLUCOSE, CAPILLARY
Glucose-Capillary: 172 mg/dL — ABNORMAL HIGH (ref 70–99)
Glucose-Capillary: 192 mg/dL — ABNORMAL HIGH (ref 70–99)
Glucose-Capillary: 137 mg/dL — ABNORMAL HIGH (ref 70–99)
Glucose-Capillary: 185 mg/dL — ABNORMAL HIGH (ref 70–99)

## 2024-04-30 LAB — BASIC METABOLIC PANEL WITH GFR
Anion gap: 10 (ref 5–15)
BUN: 33 mg/dL — ABNORMAL HIGH (ref 8–23)
CO2: 30 mmol/L (ref 22–32)
Calcium: 8.8 mg/dL — ABNORMAL LOW (ref 8.9–10.3)
Chloride: 94 mmol/L — ABNORMAL LOW (ref 98–111)
Creatinine, Ser: 1.13 mg/dL — ABNORMAL HIGH (ref 0.44–1.00)
GFR, Estimated: 49 mL/min — ABNORMAL LOW (ref >60)
Glucose, Bld: 165 mg/dL — ABNORMAL HIGH (ref 70–99)
Potassium: 4.3 mmol/L (ref 3.5–5.1)
Sodium: 134 mmol/L — ABNORMAL LOW (ref 135–145)

## 2024-04-30 LAB — CBC
HCT: 42.2 % (ref 36.0–46.0)
Hemoglobin: 12.6 g/dL (ref 12.0–15.0)
MCH: 24.8 pg — ABNORMAL LOW (ref 26.0–34.0)
MCHC: 29.9 g/dL — ABNORMAL LOW (ref 30.0–36.0)
MCV: 83.1 fL (ref 80.0–100.0)
Platelets: 397 10*3/uL (ref 150–400)
RBC: 5.08 MIL/uL (ref 3.87–5.11)
RDW: 15 % (ref 11.5–15.5)
WBC: 6.7 10*3/uL (ref 4.0–10.5)
nRBC: 0 % (ref 0.0–0.2)

## 2024-04-30 MED ORDER — POTASSIUM CHLORIDE 20 MEQ PO PACK
40.0000 meq | PACK | Freq: Every day | ORAL | Status: DC
  Administered 2024-04-30 – 2024-05-01 (×2): 40 meq via ORAL
  Filled 2024-04-30 (×2): qty 2

## 2024-04-30 MED ORDER — FUROSEMIDE 40 MG PO TABS
40.0000 mg | ORAL_TABLET | Freq: Every day | ORAL | Status: DC
  Administered 2024-05-01: 40 mg via ORAL
  Filled 2024-04-30: qty 1

## 2024-04-30 MED ORDER — CARVEDILOL 3.125 MG PO TABS
3.1250 mg | ORAL_TABLET | Freq: Two times a day (BID) | ORAL | Status: DC
  Administered 2024-04-30 – 2024-05-01 (×2): 3.125 mg via ORAL
  Filled 2024-04-30 (×2): qty 1

## 2024-04-30 NOTE — Plan of Care (Signed)
 Carvedilol  dose reduced due to transient bradycardia last evening/overnight while patient was asleep.  Otherwise uneventful shift.

## 2024-04-30 NOTE — Progress Notes (Signed)
 Daily Progress Note   Patient Name: Laura [name]       Date: 04/30/2024 DOB: 1940/11/26  Age: 83 y.o. MRN#: [id number] Attending Physician: Laura Rape, MD Primary Care Physician: Laura Lobstein, MD Admit Date: 04/24/2024 Length of Stay: 5 days  Reason for Consultation/Follow-up: Establishing goals of care  Subjective:   Reviewed EMR prior to presenting to bedside.  Reviewed recent documentation by cardiology and hospitalist.   Presented to bedside to see patient.  Patient laying comfortably.  Interpreter present at bedside.  Able to meet patient's cousin, Laura [name], who is also present at bedside.  Patient's HCPOA was not present at bedside at this time.  Introduced myself as a member of the palliative medicine team my role in patient's medical journey.  Reviewed planning moving forward had been discussed with patient's Laura [name].  Patient needs long-term care in skilled nursing facility with addition of hospice.  Plan is to focus on patient's quality of life and enjoy time outside of the hospital.  Do not want to bring patient back to hospital unless needed for comfort.  Cousin supporting this.  Spent time answering questions regarding medical care and diagnosis of heart failure.  Patient currently noting she is "[name]" and just wants some soap and her eyedrops.  Noted would notify RN.  All questions answered at that time.  Palliative medicine team continue to follow along with patient's medical journey.  Objective:   Vital Signs:  BP (!) 154/99 (BP Location: Left Arm)   Pulse 79   Temp 98 F (36.7 C)   Resp 20   Ht 5\' 2"  (1.575 m)   Wt 69.2 kg   SpO2 100%   BMI 27.90 kg/m   Physical Exam: General: NAD, awake, chronically ill-appearing, laying in bed Cardiovascular: RRR Respiratory: no increased work of breathing noted, not in respiratory distress Neuro: Awake, interactive   Assessment & Plan:   Assessment: Patient is an 83 year old female with a past medical history of  cognitive impairment/autism spectrum, generalized anxiety disorder, bilateral deafness, hyperlipidemia, hypertension, hypothyroidism, RLS, CVA, seizure disorder, CKD, diabetes mellitus who was admitted on 04/24/2024 for management of lower extremity swelling, dyspnea on exertion, and abdominal distention. Patient normally resides in [facility] retirement community with caregiver/HCPOA. Patient communicates with sign language and previously was able to ambulate with help of the walker. During hospitalization, patient found to have acute on chronic heart failure. Prior EF had shown diastolic heart failure with EF of 60% though repeat echo now showing EF 30-35%. Cardiology consulted for recommendations. Palliative medicine team consulted to assist with complex medical decision making.   Recommendations/Plan: # Complex medical decision making/goals of care:  - Patient unable to participate in complex medical decision making due to underlying medical status.  - Discussed care extensively with patient's HCPOA/interpreter, Laura [name], on 04/28/2024.  Updated patient's cousin, Laura [name], at bedside.  Laura [name] had already noted that family can discuss patient's care.  Discussed pathways for medical care moving forward.  Patient has comprehension of approximately 6-82-year-old if this.  Patient is scared by aggressive medical interventions in the hospital. Laura [name] notes quality time for patient moving forward would be in a long-term care facility to establish routine in people she is familiar with and not returning to the hospital for further medical interventions.  At this time HCPOA reaching out about possible long-term care placement.  Plan to involve hospice to assist with symptoms once patient is in long-term care due to patient having multiple underlying medical comorbidities and not planning  to return to the hospital for interventions.  TOC consulted to assist with this.  -  Code Status: Limited: Do not attempt resuscitation (DNR)  -DNR-LIMITED -Do Not Intubate/DNI    - Patient would likely need MOST form for facility to prevent rehospitalizations.  Have been attempting to complete though patient's HCPOA not present at bedside during visit.  Will attempt to continue completing.  Patient does have signed DNR form on paper chart.  # Psychosocial Support:  -HCPOA- Laura [name]   # Discharge Planning: To Be Determined  - Patient's HCPOA reaching out to long-term care facility about care.  Plan to involve hospice at long-term care facility.  Thank you for allowing the palliative care team to participate in the care Laura [name].  Laura Libel, DO Palliative Care Provider PMT # [phone]  If patient remains symptomatic despite maximum doses, please call PMT at [phone] between 0700 and 1900. Outside of these hours, please call attending, as PMT does not have night coverage.   Personally spent 35 minutes in patient care including extensive chart review (labs, imaging, progress/consult notes, vital signs), medically appropraite exam, discussed with treatment team, education to patient, family, and staff, documenting clinical information, medication review and management, coordination of care, and available advanced directive documents.

## 2024-04-30 NOTE — Progress Notes (Signed)
 PROGRESS NOTE Laura [name]  ZOX:[id number] DOB: 01-28-1941 DOA: 04/24/2024 PCP: Helyn Lobstein, MD  Brief Narrative/Hospital Course: 83 year old female, resident of retirement community, lives with caregiver/HCPOA, ambulates with the help of a walker, communicates by sign language, medical history significant for cognitive impairment/autism spectrum, generalized anxiety disorder, bilateral deafness, HLD, HTN, hypothyroid, RLS, stroke, seizure disorder, stage IIIa CKD, DM 2 presented to the ED on 6/2 due to complaints of lower extremity swelling, dyspnea on exertion, abdominal distention.  Limited history due to cognitive impairment and language/communication barrier on admission.  Admitted for suspected new onset acute CHF, started on IV Lasix . Cardiology consulted> echo obtained with reduced EF of 30-25%, continue on diuresis, GDMT being adjusted. Patient has had significant fluid overload leg edema and is steadily improving  Subjective: Seen and examined this morning Causing and interpreter at the bedside Overnight having bradycardia Coreg  dose was decreased per cardiology BP in 150s, on room air, denies shortness of breath chest pain.  She appears pleasant  Labs reviewed creatinine slightly at 1.13 stable lites and CBC otherwise Negative -6.9L SO FAR, weight at 140 pound on admission 150-152  Assessment and plan:  Acute HFrEF: Chronic diastolic CHF history presented with progressive shortness of breath, significant leg edema and BNP 659 CT chest edema versus air trapping versus airspace infiltrate. Echo 07/15/2018 showed LVEF of 55-60% and  G1DD> TTE 6/4-LBBB  Lvef  is 30-35%,decreased function, GHKN, Mild concentric lvh G1DD Cardiology following adjusting diuretics GDMT.  Now with bradycardia Coreg  dose decreased. Leg edema significantly improved, cont po Lasix :GDMT:Aldactone , Farxiga , Entresto , Coreg  and dose being adjusted due to bradycardia Monitor intake output Daily weight  electrolytes, Keep on  salt/fluid restricted diet and monitor in tele. Net IO Since Admission: -6,869 mL [04/30/24 1044]  Filed Weights   04/26/24 0500 04/29/24 0900 04/30/24 0707  Weight: 66.7 kg 69.2 kg 63.8 kg    Recent Labs  Lab 04/24/24 1157 04/25/24 0552 04/26/24 0816 04/27/24 0433 04/28/24 0359 04/29/24 0356 04/30/24 0329  BNP 659.6*  --   --   --   --   --   --   BUN 17   < > 20 22 21  26* 33*  CREATININE 0.85   < > 0.88 0.83 0.85 0.78 1.13*  K 3.9   < > 4.1 4.3 3.9 3.9 4.3   < > = values in this interval not displayed.   Essential hypertension Continue GDMT as above, due to uncontrolled hypertension amlodipine  started 6/7.    Bilateral leg cellulitis, left > right: In the setting of fluid overload and also has tendency to pick up causing wounds. Multiple scabs and also had wheezing previously.  Doppler negative for DVT.   Completed doxycycline  x 5 days   Hypovolemic hyponatremia: Sodium improved    Normocytic anemia Hemoglobin stable/normal at 12    Hypothyroidism Last TSH in 2019: 12.31.  TSH slightly up 7.9, continue home Synthroid   Acute metabolic encephalopathy Chronic hypercapnia PCO2 58-69-ph stable Cognitive impairment/autism spectrum/bilateral deafness History of stroke/Seizure Generalized anxiety disorder: Patient was lethargic difficult to arouse subsequently improved 6/4 and was seen at the bedside no focal weakness>had CT scan of the head 6/4 no new changes.  Mentation improved alert awake oriented at baseline.  She does have chronic left eye redness and lid issues does not close properly per history. Continue her Keppra  Requip  and her Asa 81 Mg daily + Plavix  75  and statins.   Dyslipidemia Continue atorvastatin  80 mg daily   Type II DM with  controlled hyperglycemia A1c 8.5 on 04/24/2024.  Overall stable continue on 0 to 6 unit SSI holding metformin  and Farxiga  as above.  No more hypoglycemia. Recent Labs  Lab 04/24/24 2104 04/25/24 0728  04/29/24 1159 04/29/24 1641 04/29/24 2122 04/29/24 2131 04/30/24 0733  GLUCAP  --    < > 217* 141* 182* 165* 172*  HGBA1C 8.5*  --   --   --   --   --   --    < > = values in this interval not displayed.    RLS Continue Requip    Goals of care: Patient with poor functional status, now with systolic CHF and dysfunction multiple complex comorbidities  Palliative care was consulted and patient CODE STATUS has been changed to DNR limited.  At high risk of readmissions    DVT prophylaxis: enoxaparin  (LOVENOX ) injection 40 mg Start: 04/24/24 2200 Code Status:   Code Status: Limited: Do not attempt resuscitation (DNR) -DNR-LIMITED -Do Not Intubate/DNI  Family Communication: plan of care discussed with patient/family cousin at bedside. Patient status is: Remains hospitalized because of severity of illness Level of care: Progressive   Dispo: The patient is from: home            Anticipated disposition: Anticipate return to facility tomorrow. She wants to return to the facility  Objective: Vitals last 24 hrs: Vitals:   04/29/24 2048 04/29/24 2115 04/30/24 0406 04/30/24 0707  BP: (!) 156/90  (!) 154/99   Pulse: (!) 41 62 79   Resp: 20  20   Temp: 97.6 F (36.4 C)  98 F (36.7 C)   TempSrc: Oral     SpO2: 91%  100%   Weight:    63.8 kg  Height:        Physical Examination: General exam: alert awake, communicative  sign language HEENT:Oral mucosa moist, Ear/Nose WNL grossly Respiratory system: Bilaterally clear BS,no use of accessory muscle Cardiovascular system: S1 & S2 +, No JVD. Gastrointestinal system: Abdomen soft,NT,ND, BS+ Nervous System: Alert, awake, moving all extremities,and following commands. Extremities: LE edema improved with skin wrinkling Skin: No rashes,no icterus. MSK: Normal muscle bulk,tone, power   Data Reviewed: I have personally reviewed following labs and imaging studies ( see epic result tab) CBC: Recent Labs  Lab 04/24/24 1157 04/25/24 0552  04/26/24 0430 04/27/24 0433 04/28/24 0359 04/29/24 0356 04/30/24 0329  WBC 10.1   < > 7.9 10.2 8.2 8.0 6.7  NEUTROABS 8.0*  --   --   --   --   --   --   HGB 11.7*   < > 11.5* 11.2* 12.0 12.5 12.6  HCT 38.4   < > 37.4 38.1 40.1 40.9 42.2  MCV 81.9   < > 82.4 83.9 83.2 82.5 83.1  PLT 381   < > 332 395 391 385 397   < > = values in this interval not displayed.   CMP: Recent Labs  Lab 04/26/24 0816 04/27/24 0433 04/28/24 0359 04/29/24 0356 04/30/24 0329  NA 130* 129* 130* 131* 134*  K 4.1 4.3 3.9 3.9 4.3  CL 91* 87* 89* 91* 94*  CO2 34* 34* 32 29 30  GLUCOSE 171* 161* 135* 131* 165*  BUN 20 22 21  26* 33*  CREATININE 0.88 0.83 0.85 0.78 1.13*  CALCIUM  7.9* 8.7* 8.7* 8.8* 8.8*   GFR: Estimated Creatinine Clearance: 34.3 mL/min (A) (by C-G formula based on SCr of 1.13 mg/dL (H)). Recent Labs  Lab 04/24/24 1157 04/25/24 0552  AST 27 22  ALT 30 27  ALKPHOS 83 78  BILITOT 1.0 0.9  PROT 7.2 6.8  ALBUMIN 3.7 3.5   No results for input(s): "LIPASE", "AMYLASE" in the last 168 hours.  Recent Labs  Lab 04/26/24 1820  AMMONIA 13   Coagulation Profile: No results for input(s): "INR", "PROTIME" in the last 168 hours. Unresulted Labs (From admission, onward)     Start     Ordered   04/27/24 0500  Basic metabolic panel with GFR  Daily,   R     Question:  Specimen collection method  Answer:  Lab=Lab collect   04/26/24 1254   04/27/24 0500  CBC  Daily,   R     Question:  Specimen collection method  Answer:  Lab=Lab collect   04/26/24 1254           Antimicrobials/Microbiology: Anti-infectives (From admission, onward)    Start     Dose/Rate Route Frequency Ordered Stop   04/25/24 1000  doxycycline  (VIBRA -TABS) tablet 100 mg        100 mg Oral Every 12 hours 04/25/24 0900 04/29/24 2104         Component Value Date/Time   SDES URINE, CLEAN CATCH 07/25/2018 1058   SPECREQUEST  07/25/2018 1058    NONE Performed at [facility], [address], [place],  [place] [address]    CULT MULTIPLE SPECIES PRESENT, SUGGEST RECOLLECTION (A) 07/25/2018 1058   REPTSTATUS 07/26/2018 FINAL 07/25/2018 1058    Procedures:  Medications reviewed:  Scheduled Meds:  amLODipine   5 mg Oral Daily   aspirin   81 mg Oral Daily   atorvastatin   80 mg Oral QHS   carvedilol   3.125 mg Oral BID WC   cholecalciferol   1,000 Units Oral Daily   clopidogrel   75 mg Oral Daily   dapagliflozin  propanediol  10 mg Oral Daily   enoxaparin  (LOVENOX ) injection  40 mg Subcutaneous Q24H   fluticasone   2 spray Each Nare Daily   [START ON 05/01/2024] furosemide   40 mg Oral Daily   insulin  aspart  0-6 Units Subcutaneous TID WC   levETIRAcetam   500 mg Oral BID   levothyroxine   75 mcg Oral QAC breakfast   loratadine   10 mg Oral q AM   polyethylene glycol  17 g Oral Daily   potassium chloride   40 mEq Oral Daily   rOPINIRole   2 mg Oral QHS   sacubitril -valsartan   1 tablet Oral BID   senna-docusate  1 tablet Oral BID   spironolactone   12.5 mg Oral Daily   Continuous Infusions:  Lesa Rape, MD [facility] 04/30/2024, 10:45 AM

## 2024-04-30 NOTE — Progress Notes (Signed)
 Progress Note  Patient Name: Laura [name] Date of Encounter: 04/30/2024  Primary Cardiologist: Kardie Tobb, DO  Interval Summary  Chart reviewed.  Patient has had some sinus pauses and junctional bradycardia when asleep, baseline heart rate in the 60s to 70s.  Net urine output of approximately 700 cc last 24 hours, although incomplete collection.  Vital Signs  Vitals:   04/29/24 2048 04/29/24 2115 04/30/24 0406 04/30/24 0707  BP: (!) 156/90  (!) 154/99   Pulse: (!) 41 62 79   Resp: 20  20   Temp: 97.6 F (36.4 C)  98 F (36.7 C)   TempSrc: Oral     SpO2: 91%  100%   Weight:    63.8 kg  Height:        Intake/Output Summary (Last 24 hours) at 04/30/2024 0828 Last data filed at 04/30/2024 0428 Gross per 24 hour  Intake 1094 ml  Output 2025 ml  Net -931 ml   Filed Weights   04/26/24 0500 04/29/24 0900 04/30/24 0707  Weight: 66.7 kg 69.2 kg 63.8 kg    Physical Exam  GEN: No acute distress.   Neck: No JVD. Cardiac: RRR, no murmur, rub, or gallop.  Respiratory: Nonlabored. Clear to auscultation bilaterally. MS: Gradually improving peripheral edema.  ECG/Telemetry  Telemetry reviewed, currently in sinus rhythm.  Labs  Chemistry Recent Labs  Lab 04/24/24 1157 04/25/24 0552 04/26/24 0816 04/28/24 0359 04/29/24 0356 04/30/24 0329  NA 127* 128*   < > 130* 131* 134*  K 3.9 3.6   < > 3.9 3.9 4.3  CL 89* 87*   < > 89* 91* 94*  CO2 26 32   < > 32 29 30  GLUCOSE 107* 128*   < > 135* 131* 165*  BUN 17 15   < > 21 26* 33*  CREATININE 0.85 0.71   < > 0.85 0.78 1.13*  CALCIUM  8.7* 8.5*   < > 8.7* 8.8* 8.8*  PROT 7.2 6.8  --   --   --   --   ALBUMIN 3.7 3.5  --   --   --   --   AST 27 22  --   --   --   --   ALT 30 27  --   --   --   --   ALKPHOS 83 78  --   --   --   --   BILITOT 1.0 0.9  --   --   --   --   GFRNONAA >60 >60   < > >60 >60 49*  ANIONGAP 12 9   < > 9 11 10    < > = values in this interval not displayed.    Hematology Recent Labs  Lab  04/28/24 0359 04/29/24 0356 04/30/24 0329  WBC 8.2 8.0 6.7  RBC 4.82 4.96 5.08  HGB 12.0 12.5 12.6  HCT 40.1 40.9 42.2  MCV 83.2 82.5 83.1  MCH 24.9* 25.2* 24.8*  MCHC 29.9* 30.6 29.9*  RDW 14.7 15.0 15.0  PLT 391 385 397   Cardiac Studies  Echocardiogram 04/26/2024:  1. Septal dyssynerg in setting of LBBB. Left ventricular ejection  fraction, by estimation, is 30 to 35%. The left ventricle has moderately  decreased function. The left ventricle demonstrates global hypokinesis.  There is mild concentric left ventricular  hypertrophy. Left ventricular diastolic parameters are consistent with  Grade I diastolic dysfunction (impaired relaxation).   2. Right ventricular systolic function is moderately reduced. The right  ventricular size is normal.   3. Left atrial size was mildly dilated.   4. Right atrial size was mildly dilated.   5. The mitral valve is degenerative. Mild mitral valve regurgitation. No  evidence of mitral stenosis.   6. The aortic valve is tricuspid. There is mild calcification of the  aortic valve. Aortic valve regurgitation is not visualized. Aortic valve  sclerosis/calcification is present, without any evidence of aortic  stenosis.   7. The inferior vena cava is normal in size with greater than 50%  respiratory variability, suggesting right atrial pressure of 3 mmHg.   Assessment & Plan  1.  Acute HFrEF, echocardiogram this admission demonstrates LVEF 30 to 35% with global hypokinesis and septal motion consistent with left bundle branch block.  RV contraction also moderately reduced.  Clinically improving with IV diuresis and optimization of GDMT.  2.  Primary hypertension.  3.  Mixed hyperlipidemia.  Continue Lipitor  80 mg daily..  4.  History of stroke.  Continues on aspirin  81 mg daily and Plavix  75 mg daily.  5.  Intermittent sinus pauses and junctional bradycardia while sleeping.  Not obviously symptomatic otherwise.  Potentially associated with  undiagnosed OSA, although cutting back beta-blocker dose.  Creatinine has bumped up to 1.13, GFR 49.  Plan to stop IV Lasix  after this morning's dose and can convert to oral tomorrow.  Decrease Coreg  to 3.125 mg twice daily and continue to follow telemetry.  Otherwise on Farxiga  10 mg daily, Entresto  24/26 mg twice daily, and Aldactone  12.5 mg daily.  For questions or updates, please contact [facility] Please consult [email or web address] for contact info under   Signed, Teddie Favre, MD  04/30/2024, 8:28 AM

## 2024-05-01 ENCOUNTER — Other Ambulatory Visit: Payer: Self-pay | Admitting: Student

## 2024-05-01 ENCOUNTER — Other Ambulatory Visit

## 2024-05-01 ENCOUNTER — Encounter: Payer: Self-pay | Admitting: *Deleted

## 2024-05-01 ENCOUNTER — Other Ambulatory Visit (HOSPITAL_COMMUNITY): Payer: Self-pay

## 2024-05-01 DIAGNOSIS — I5021 Acute systolic (congestive) heart failure: Secondary | ICD-10-CM

## 2024-05-01 DIAGNOSIS — E8770 Fluid overload, unspecified: Secondary | ICD-10-CM | POA: Diagnosis not present

## 2024-05-01 DIAGNOSIS — I455 Other specified heart block: Secondary | ICD-10-CM

## 2024-05-01 DIAGNOSIS — E7800 Pure hypercholesterolemia, unspecified: Secondary | ICD-10-CM

## 2024-05-01 DIAGNOSIS — I420 Dilated cardiomyopathy: Secondary | ICD-10-CM

## 2024-05-01 LAB — BASIC METABOLIC PANEL WITH GFR
Anion gap: 10 (ref 5–15)
BUN: 38 mg/dL — ABNORMAL HIGH (ref 8–23)
CO2: 28 mmol/L (ref 22–32)
Calcium: 8.8 mg/dL — ABNORMAL LOW (ref 8.9–10.3)
Chloride: 96 mmol/L — ABNORMAL LOW (ref 98–111)
Creatinine, Ser: 1.08 mg/dL — ABNORMAL HIGH (ref 0.44–1.00)
GFR, Estimated: 52 mL/min — ABNORMAL LOW (ref >60)
Glucose, Bld: 145 mg/dL — ABNORMAL HIGH (ref 70–99)
Potassium: 4.5 mmol/L (ref 3.5–5.1)
Sodium: 134 mmol/L — ABNORMAL LOW (ref 135–145)

## 2024-05-01 LAB — CBC
HCT: 43.1 % (ref 36.0–46.0)
Hemoglobin: 12.9 g/dL (ref 12.0–15.0)
MCH: 24.8 pg — ABNORMAL LOW (ref 26.0–34.0)
MCHC: 29.9 g/dL — ABNORMAL LOW (ref 30.0–36.0)
MCV: 82.7 fL (ref 80.0–100.0)
Platelets: 407 10*3/uL — ABNORMAL HIGH (ref 150–400)
RBC: 5.21 MIL/uL — ABNORMAL HIGH (ref 3.87–5.11)
RDW: 15.1 % (ref 11.5–15.5)
WBC: 6.9 10*3/uL (ref 4.0–10.5)
nRBC: 0 % (ref 0.0–0.2)

## 2024-05-01 LAB — GLUCOSE, CAPILLARY
Glucose-Capillary: 139 mg/dL — ABNORMAL HIGH (ref 70–99)
Glucose-Capillary: 243 mg/dL — ABNORMAL HIGH (ref 70–99)

## 2024-05-01 MED ORDER — DAPAGLIFLOZIN PROPANEDIOL 10 MG PO TABS
10.0000 mg | ORAL_TABLET | Freq: Every day | ORAL | 0 refills | Status: AC
  Filled 2024-05-01: qty 30, 30d supply, fill #0

## 2024-05-01 MED ORDER — SPIRONOLACTONE 25 MG PO TABS
12.5000 mg | ORAL_TABLET | Freq: Every day | ORAL | 0 refills | Status: AC
  Filled 2024-05-01: qty 15, 30d supply, fill #0

## 2024-05-01 MED ORDER — SENNOSIDES-DOCUSATE SODIUM 8.6-50 MG PO TABS
1.0000 | ORAL_TABLET | Freq: Two times a day (BID) | ORAL | 0 refills | Status: AC
  Filled 2024-05-01: qty 60, 30d supply, fill #0

## 2024-05-01 MED ORDER — SACUBITRIL-VALSARTAN 49-51 MG PO TABS
1.0000 | ORAL_TABLET | Freq: Two times a day (BID) | ORAL | 0 refills | Status: AC
  Filled 2024-05-01: qty 60, 30d supply, fill #0

## 2024-05-01 MED ORDER — SACUBITRIL-VALSARTAN 49-51 MG PO TABS: 1.0000 | ORAL_TABLET | Freq: Two times a day (BID) | ORAL | Status: DC

## 2024-05-01 MED ORDER — FUROSEMIDE 40 MG PO TABS
40.0000 mg | ORAL_TABLET | Freq: Every day | ORAL | 0 refills | Status: AC
  Filled 2024-05-01: qty 30, 30d supply, fill #0

## 2024-05-01 MED ORDER — POLYETHYLENE GLYCOL 3350 17 GM/SCOOP PO POWD
17.0000 g | Freq: Every day | ORAL | 0 refills | Status: AC
  Filled 2024-05-01: qty 238, 14d supply, fill #0

## 2024-05-01 MED ORDER — POTASSIUM CHLORIDE 20 MEQ PO PACK
40.0000 meq | PACK | Freq: Every day | ORAL | 0 refills | Status: AC
  Filled 2024-05-01: qty 60, 30d supply, fill #0

## 2024-05-01 NOTE — Progress Notes (Signed)
 14 day cardiac monitor for pauses. Current plan is for patient to be discharged to SNF. Dr Emmette Harms to read.

## 2024-05-01 NOTE — Discharge Summary (Signed)
 Physician Discharge Summary  Laura [name] RUE:[id number] DOB: 1940-11-28 DOA: 04/24/2024  PCP: Helyn Lobstein, MD  Admit date: 04/24/2024 Discharge date: 05/01/2024 Recommendations for Outpatient Follow-up:  Follow up with PCP in 1 weeks-call for appointment Continue heart monitor x 2 weeks Follow-up with cardiology Please obtain BMP/CBC in one week  Discharge Dispo: alf Discharge Condition: Stable Code Status:   Code Status: Limited: Do not attempt resuscitation (DNR) -DNR-LIMITED -Do Not Intubate/DNI  Diet recommendation:  Diet Order             Diet Heart Room service appropriate? Yes; Fluid consistency: Thin  Diet effective now                    Brief/Interim Summary: 83 year old female, resident of retirement community, lives with caregiver/HCPOA, ambulates with the help of a walker, communicates by sign language, medical history significant for cognitive impairment/autism spectrum, generalized anxiety disorder, bilateral deafness, HLD, HTN, hypothyroid, RLS, stroke, seizure disorder, stage IIIa CKD, DM 2 presented to the ED on 6/2 due to complaints of lower extremity swelling, dyspnea on exertion, abdominal distention.  Limited history due to cognitive impairment and language/communication barrier on admission.  Admitted for suspected new onset acute CHF, started on IV Lasix . Cardiology consulted> echo obtained with reduced EF of 30-25%, continue on diuresis, GDMT being adjusted. Patient has had significant fluid overload leg edema and is steadily improving.  GDMT has been adjusted and now on oral diuretics Seen and cleared by cardiology today.  Patient is clinically improved.  Plan is for discharge back to facility ALF  Subjective: Patient seen and examined this morning she mobilized in the hallway doing well on room air.  Leg edema has resolved HCPOA and cousin at the bedside Overnight afebrile BP 140s-160s, on room air Labs with stable renal function and  CBC. Overall/clinically improved and eager to go home> her weight is down to 63.2 kg  Discharge diagnosis :  Acute HFrEF w. Ef 30-35%: Chronic diastolic CHF history presented with progressive shortness of breath, significant leg edema and BNP 659 CT chest edema versus air trapping versus airspace infiltrate. Echo 07/15/2018 showed LVEF of 55-60% and  G1DD> TTE 6/4-LBBB  Lvef  is 30-35%,decreased function, GHKN, Mild concentric lvh G1DD Cardiology following adjusting diuretics GDMT.  Now with bradycardia Coreg  dose decreased 6/8 and stopped 6/9 due to pauses and braducardia. Leg edema significantly improved, cont po Lasix :GDMT:Aldactone , Farxiga , Entresto , Coreg  and dose being adjusted due to bradycardia on 6/8 and heart rate remained stable at this time. Net IO Since Admission: -7,870 mL [05/01/24 1321]  Filed Weights   04/29/24 0900 04/30/24 0707 05/01/24 0401  Weight: 69.2 kg 63.8 kg 63.2 kg   Intermittent pause and junctional bradycardia: Per cardiology beta-blocker has been discontinued.  Probably from sleep apnea but are not a good candidate for CPAP studies.  Plan for outpatient cardiac monitor.  Continue to monitor as per cardiology as OP. NO MORE BETA BLOCKER cont heart monitor and fu as OP.  Essential hypertension Continue GDMT as above, due to uncontrolled hypertension amlodipine  started 6/7.    Bilateral leg cellulitis, left > right: In the setting of fluid overload and also has tendency to pick up causing wounds. Multiple scabs and also had wheezing previously.  Doppler negative for DVT.  Completed doxycycline  x 5 days   Hypovolemic hyponatremia: Sodium improved    Normocytic anemia Hemoglobin stable/normal at 12    Hypothyroidism Last TSH in 2019: 12.31.  TSH slightly up 7.9, continue  home Synthroid   Acute metabolic encephalopathy Chronic hypercapnia PCO2 58-69-ph stable Cognitive impairment/autism spectrum/bilateral deafness History of stroke/Seizure Generalized  anxiety disorder: Patient was lethargic difficult to arouse subsequently improved 6/4 and was seen at the bedside no focal weakness>had CT scan of the head 6/4 no new changes.  Mentation improved alert awake oriented at baseline.  She does have chronic left eye redness and lid issues does not close properly per history. Continue her Keppra  Requip  and her Asa 81 Mg daily + Plavix  75  and statins.   Dyslipidemia Continue atorvastatin  80 mg daily   Type II DM with controlled hyperglycemia A1c 8.5 on 04/24/2024.  Overall stable continue on 0 to 6 unit SSI holding metformin  and Farxiga  as above. Resume home meds Recent Labs  Lab 04/24/24 2104 04/25/24 0728 04/30/24 1133 04/30/24 1655 04/30/24 2144 05/01/24 0745 05/01/24 1124  GLUCAP  --    < > 192* 137* 185* 139* 243*  HGBA1C 8.5*  --   --   --   --   --   --    < > = values in this interval not displayed.    RLS Continue Requip    Goals of care: Patient with poor functional status, now with systolic CHF and dysfunction multiple complex comorbidities  Palliative care was consulted and patient CODE STATUS has been changed to DNR limited.  At high risk of readmissions    Discharge Exam: Vitals:   05/01/24 0354 05/01/24 1209  BP: (!) 163/72 101/83  Pulse: 69 (!) 56  Resp:  18  Temp: 97.6 F (36.4 C) 97.9 F (36.6 C)  SpO2: 96% 100%   General: Pt is alert, awake, not in acute distress Cardiovascular: RRR, S1/S2 +, no rubs, no gallops Respiratory: CTA bilaterally, no wheezing, no rhonchi Abdominal: Soft, NT, ND, bowel sounds + Extremities: no edema, no cyanosis  Discharge Instructions  Discharge Instructions     (HEART FAILURE PATIENTS) Call MD:  Anytime you have any of the following symptoms: 1) 3 pound weight gain in 24 hours or 5 pounds in 1 week 2) shortness of breath, with or without a dry hacking cough 3) swelling in the hands, feet or stomach 4) if you have to sleep on extra pillows at night in order to breathe.   Complete  by: As directed    Discharge instructions   Complete by: As directed    Please call call MD or return to ER for similar or worsening recurring problem that brought you to hospital or if any fever,nausea/vomiting,abdominal pain, uncontrolled pain, chest pain,  shortness of breath or any other alarming symptoms.  Please follow-up your doctor as instructed in a week time and call the office for appointment.  Please avoid alcohol, smoking, or any other illicit substance and maintain healthy habits including taking your regular medications as prescribed.  You were cared for by a hospitalist during your hospital stay. If you have any questions about your discharge medications or the care you received while you were in the hospital after you are discharged, you can call the unit and ask to speak with the hospitalist on call if the hospitalist that took care of you is not available.  Once you are discharged, your primary care physician will handle any further medical issues. Please note that NO REFILLS for any discharge medications will be authorized once you are discharged, as it is imperative that you return to your primary care physician (or establish a relationship with a primary care physician if you  do not have one) for your aftercare needs so that they can reassess your need for medications and monitor your lab values   Increase activity slowly   Complete by: As directed       Allergies as of 05/01/2024   No Known Allergies      Medication List     TAKE these medications    acetaminophen  325 MG tablet Commonly known as: TYLENOL  Take 1-2 tablets (325-650 mg total) by mouth every 4 (four) hours as needed for mild pain. What changed:  when to take this reasons to take this   alendronate  70 MG tablet Commonly known as: FOSAMAX  TAKE 1 TABLET BY MOUTH ONCE A WEEK .  TAKE  WITH  A  FULL  GLASS  OF  WATER  ON  AN  EMPTY  STOMACH What changed:  how much to take how to take this when to  take this additional instructions   aspirin  81 MG chewable tablet Chew 81 mg by mouth daily.   atorvastatin  80 MG tablet Commonly known as: LIPITOR  Take 1 tablet (80 mg total) by mouth daily at 6 PM. What changed: when to take this   clopidogrel  75 MG tablet Commonly known as: PLAVIX  Take 1 tablet (75 mg total) by mouth daily.   dapagliflozin  propanediol 10 MG Tabs tablet Commonly known as: FARXIGA  Take 1 tablet (10 mg total) by mouth daily. Start taking on: May 02, 2024   fluticasone  50 MCG/ACT nasal spray Commonly known as: FLONASE  Place 2 sprays into both nostrils in the morning and at bedtime.   furosemide  40 MG tablet Commonly known as: LASIX  Take 1 tablet (40 mg total) by mouth daily. Start taking on: May 02, 2024 What changed:  medication strength how much to take when to take this   levETIRAcetam  500 MG tablet Commonly known as: KEPPRA  Take 1 tablet (500 mg total) by mouth 2 (two) times daily.   levothyroxine  75 MCG tablet Commonly known as: SYNTHROID  Take 75 mcg by mouth daily before breakfast.   loratadine  10 MG tablet Commonly known as: CLARITIN  Take 1 tablet (10 mg total) by mouth daily as needed for allergies. What changed: when to take this   metFORMIN  500 MG 24 hr tablet Commonly known as: GLUCOPHAGE -XR Take 1 tablet (500 mg total) by mouth daily with breakfast. What changed: how much to take   multivitamin tablet Take 1 tablet by mouth daily with breakfast.   polyethylene glycol 17 g packet Commonly known as: MIRALAX  / GLYCOLAX  Take 17 g by mouth daily. Start taking on: May 02, 2024   potassium chloride  20 MEQ packet Commonly known as: KLOR-CON  Take 40 mEq by mouth daily. Start taking on: May 02, 2024   rOPINIRole  2 MG tablet Commonly known as: REQUIP  Take 2 mg by mouth at bedtime.   sacubitril -valsartan  49-51 MG Commonly known as: ENTRESTO  Take 1 tablet by mouth 2 (two) times daily.   senna-docusate 8.6-50 MG  tablet Commonly known as: Senokot-S Take 1 tablet by mouth 2 (two) times daily.   spironolactone  25 MG tablet Commonly known as: ALDACTONE  Take 0.5 tablets (12.5 mg total) by mouth daily. Start taking on: May 02, 2024   Systane 0.4-0.3 % Soln Generic drug: Polyethyl Glycol-Propyl Glycol Place 1 drop into both eyes See admin instructions. Instill 1 drop into both eyes twice a day SCHEDULED and up to an additional dose every 12 hours as needed for dryness   Vitamin D3 25 MCG (1000 UT) Caps Take 1,000  Units by mouth daily.               Durable Medical Equipment  (From admission, onward)           Start     Ordered   04/27/24 1559  For home use only DME standard manual wheelchair with seat cushion  Once       Comments: Patient suffers from [facility] which impairs their ability to perform daily activities like toileting in the home.  A walker will not resolve issue with performing activities of daily living. A wheelchair will allow patient to safely perform daily activities. Patient can safely propel the wheelchair in the home or has a caregiver who can provide assistance. Length of need Lifetime. Accessories: elevating leg rests (ELRs), wheel locks, extensions and anti-tippers.   04/27/24 1559            Follow-up Information     Helyn Lobstein, MD Follow up in 1 week(s).   Specialty: Family Medicine Contact information: [address] [place] [address] [phone]                No Known Allergies  The results of significant diagnostics from this hospitalization (including imaging, microbiology, ancillary and laboratory) are listed below for reference.    Microbiology: No results found for this or any previous visit (from the past 240 hours).  Procedures/Studies: ECHOCARDIOGRAM COMPLETE Result Date: 04/26/2024    ECHOCARDIOGRAM REPORT   Patient Name:   AZALIAH CARRERO Date of Exam: 04/26/2024 Medical Rec #:  [id number]        Height:       62.0 in Accession #:    [id number]      Weight:       147.1 lb Date of Birth:  10-20-1941       BSA:          1.678 m Patient Age:    83 years        BP:           141/66 mmHg Patient Gender: F               HR:           81 bpm. Exam Location:  Inpatient Procedure: 2D Echo, Color Doppler and Cardiac Doppler (Both Spectral and Color            Flow Doppler were utilized during procedure). Indications:    CHF  History:        Patient has prior history of Echocardiogram examinations, most                 recent 07/15/2018. Risk Factors:Diabetes.  Sonographer:    Jeralene Mom Referring Phys: [id number] DAVID MANUEL ORTIZ IMPRESSIONS  1. Septal dyssynerg in setting of LBBB. Left ventricular ejection fraction, by estimation, is 30 to 35%. The left ventricle has moderately decreased function. The left ventricle demonstrates global hypokinesis. There is mild concentric left ventricular hypertrophy. Left ventricular diastolic parameters are consistent with Grade I diastolic dysfunction (impaired relaxation).  2. Right ventricular systolic function is moderately reduced. The right ventricular size is normal.  3. Left atrial size was mildly dilated.  4. Right atrial size was mildly dilated.  5. The mitral valve is degenerative. Mild mitral valve regurgitation. No evidence of mitral stenosis.  6. The aortic valve is tricuspid. There is mild calcification of the aortic valve. Aortic valve regurgitation is not visualized. Aortic valve sclerosis/calcification is present, without any evidence  of aortic stenosis.  7. The inferior vena cava is normal in size with greater than 50% respiratory variability, suggesting right atrial pressure of 3 mmHg. FINDINGS  Left Ventricle: Septal dyssynerg in setting of LBBB. Left ventricular ejection fraction, by estimation, is 30 to 35%. The left ventricle has moderately decreased function. The left ventricle demonstrates global hypokinesis. The left ventricular internal  cavity  size was normal in size. There is mild concentric left ventricular hypertrophy. Abnormal (paradoxical) septal motion, consistent with left bundle branch block. Left ventricular diastolic parameters are consistent with Grade I diastolic dysfunction (impaired relaxation). Right Ventricle: The right ventricular size is normal. No increase in right ventricular wall thickness. Right ventricular systolic function is moderately reduced. Left Atrium: Left atrial size was mildly dilated. Right Atrium: Right atrial size was mildly dilated. Pericardium: There is no evidence of pericardial effusion. Mitral Valve: The mitral valve is degenerative in appearance. Mild mitral valve regurgitation. No evidence of mitral valve stenosis. Tricuspid Valve: The tricuspid valve is normal in structure. Tricuspid valve regurgitation is mild . No evidence of tricuspid stenosis. Aortic Valve: The aortic valve is tricuspid. There is mild calcification of the aortic valve. Aortic valve regurgitation is not visualized. Aortic valve sclerosis/calcification is present, without any evidence of aortic stenosis. Pulmonic Valve: The pulmonic valve was normal in structure. Pulmonic valve regurgitation is not visualized. No evidence of pulmonic stenosis. Aorta: The aortic root is normal in size and structure. Venous: The inferior vena cava is normal in size with greater than 50% respiratory variability, suggesting right atrial pressure of 3 mmHg. IAS/Shunts: No atrial level shunt detected by color flow Doppler.   LV Volumes (MOD) LV vol d, MOD A2C: 94.2 ml LV vol d, MOD A4C: 95.3 ml LV vol s, MOD A2C: 56.0 ml LV vol s, MOD A4C: 51.6 ml LV SV MOD A2C:     38.2 ml LV SV MOD A4C:     95.3 ml LV SV MOD BP:      38.9 ml Jules Oar MD Electronically signed by Jules Oar MD Signature Date/Time: 04/26/2024/8:22:14 PM    Final    CT HEAD WO CONTRAST ( ) Result Date: 04/26/2024 CLINICAL DATA:  Delirium, stroke-like symptoms. Left-sided facial droop  and lethargy. EXAM: CT HEAD WITHOUT CONTRAST TECHNIQUE: Contiguous axial images were obtained from the base of the skull through the vertex without intravenous contrast. RADIATION DOSE REDUCTION: This exam was performed according to the departmental dose-optimization program which includes automated exposure control, adjustment of the mA and/or kV according to patient size and/or use of iterative reconstruction technique. COMPARISON:  CT head 05/13/2019. FINDINGS: Brain: No acute intracranial hemorrhage. No CT evidence of acute infarct. Encephalomalacia in the left temporal lobe suggestive of remote infarct. Additional remote infarct involving the left basal ganglia. Nonspecific hypoattenuation in the periventricular and subcortical white matter favored to reflect chronic microvascular ischemic changes. Mild parenchymal volume loss. No edema, mass effect, or midline shift. The basilar cisterns are patent. Ventricles: Ex vacuo dilatation of the left lateral ventricle. Vascular: Atherosclerotic calcifications of the carotid siphons. No hyperdense vessel. Skull: No acute or aggressive finding. Orbits: Orbits are symmetric. Sinuses: Mild mucosal thickening in the ethmoid and maxillary sinuses. Other: Mastoid air cells are clear. IMPRESSION: No CT evidence of acute intracranial abnormality. Similar encephalomalacia in the left occipital lobe. Chronic microvascular ischemic changes. Electronically Signed   By: Denny Flack M.D.   On: 04/26/2024 16:59   DG Chest Port 1 View Result Date: 04/26/2024 CLINICAL DATA:  10026  Shortness of breath 786.05.ICD-9-CM EXAM: PORTABLE CHEST - 1 VIEW COMPARISON:  April 24, 2024 FINDINGS: No focal airspace consolidation, pleural effusion, or pneumothorax. Unchanged moderate to severe cardiomegaly. Tortuosity/ectasia of the aorta, unchanged. Prominence of the right hilar region, likely due to patient's positioning and tortuosity of the aorta. No acute fracture or destructive lesions.  Multilevel thoracic osteophytosis. Remote right rib fractures. IMPRESSION: Unchanged cardiomegaly.  No pneumonia or pulmonary edema. Electronically Signed   By: Rance Burrows M.D.   On: 04/26/2024 13:44   VAS US  LOWER EXTREMITY VENOUS (DVT) (ONLY [facility] & [facility]) Result Date: 04/25/2024  Lower Venous DVT Study Patient Name:  HAVA MASSINGALE  Date of Exam:   04/24/2024 Medical Rec #: [id number]        Accession #:    [id number] Date of Birth: Aug 20, 1941        Patient Gender: F Patient Age:   47 years Exam Location:  [facility] Procedure:      VAS US  LOWER EXTREMITY VENOUS (DVT) Referring Phys: Rice Chamorro MURPHY --------------------------------------------------------------------------------  Indications: Edema, Erythema, and SOB.  Limitations: Body habitus, poor ultrasound/tissue interface and Pitting edema, patient unable to tolerate compression maneuvers or lie flat. Oxygen level dropping, patient instructed to breathe with pursed lips. Comparison Study: Prior negative bilateral LEV done 06/27/2018 Performing Technologist: Carleene Chase RVS  Examination Guidelines: A complete evaluation includes B-mode imaging, spectral Doppler, color Doppler, and power Doppler as needed of all accessible portions of each vessel. Bilateral testing is considered an integral part of a complete examination. Limited examinations for reoccurring indications may be performed as noted. The reflux portion of the exam is performed with the patient in reverse Trendelenburg.  +---------+---------------+---------+-----------+---------------+--------------+ RIGHT    CompressibilityPhasicitySpontaneityProperties     Thrombus Aging +---------+---------------+---------+-----------+---------------+--------------+ CFV      Full           Yes      No         pulsatile                                                                 waveforms                      +---------+---------------+---------+-----------+---------------+--------------+ SFJ      Full                                                             +---------+---------------+---------+-----------+---------------+--------------+ FV Prox  Full           Yes      No         pulsatile                                                                 waveforms                     +---------+---------------+---------+-----------+---------------+--------------+  FV Mid   Full                                                             +---------+---------------+---------+-----------+---------------+--------------+ FV DistalFull                                                             +---------+---------------+---------+-----------+---------------+--------------+ PFV      Full                                                             +---------+---------------+---------+-----------+---------------+--------------+ POP                     Yes      No         pulsatile                                                                 waveforms                     +---------+---------------+---------+-----------+---------------+--------------+ PTV                                                        Not well                                                                  visualized     +---------+---------------+---------+-----------+---------------+--------------+ PERO                                                       Not well                                                                  visualized     +---------+---------------+---------+-----------+---------------+--------------+   +---------+---------------+---------+-----------+---------------+--------------+ LEFT     CompressibilityPhasicitySpontaneityProperties     Thrombus Aging +---------+---------------+---------+-----------+---------------+--------------+  CFV  Full                                                             +---------+---------------+---------+-----------+---------------+--------------+ SFJ      Full                                                             +---------+---------------+---------+-----------+---------------+--------------+ FV Prox  Full           Yes      No         pulsatile                                                                 waveforms                     +---------+---------------+---------+-----------+---------------+--------------+ FV Mid   Full                                                             +---------+---------------+---------+-----------+---------------+--------------+ FV DistalFull                                                             +---------+---------------+---------+-----------+---------------+--------------+ PFV      Full                                                             +---------+---------------+---------+-----------+---------------+--------------+ POP      Full                                                             +---------+---------------+---------+-----------+---------------+--------------+ PTV                                                        Not well  visualized     +---------+---------------+---------+-----------+---------------+--------------+ PERO                                                       Not well                                                                  visualized     +---------+---------------+---------+-----------+---------------+--------------+     Summary: RIGHT: - There is no evidence of deep vein thrombosis in the lower extremity.  - No cystic structure found in the popliteal fossa.  LEFT: - There is no evidence of deep vein thrombosis in the lower extremity.  - No cystic structure  found in the popliteal fossa.  *See table(s) above for measurements and observations. Electronically signed by Delaney Fearing on 04/25/2024 at 10:09:12 AM.    Final    CT CHEST W CONTRAST Result Date: 04/24/2024 CLINICAL DATA:  Abnormal chest radiograph. Redness, swelling, and pain to the lower legs. EXAM: CT CHEST WITH CONTRAST TECHNIQUE: Multidetector CT imaging of the chest was performed during intravenous contrast administration. RADIATION DOSE REDUCTION: This exam was performed according to the departmental dose-optimization program which includes automated exposure control, adjustment of the mA and/or kV according to patient size and/or use of iterative reconstruction technique. CONTRAST:  75mL OMNIPAQUE  IOHEXOL  300 MG/ML  SOLN COMPARISON:  Chest radiograph 04/24/2024 and 07/29/2018 FINDINGS: Cardiovascular: Cardiac enlargement. No pericardial effusions. Normal caliber thoracic aorta. No aortic dissection. Scattered calcification in the aorta and coronary arteries. Central pulmonary arteries are patent without evidence of significant pulmonary embolus. Mediastinum/Nodes: Esophagus is decompressed. No significant lymphadenopathy. Thyroid  gland is unremarkable. Prominent mediastinal fat likely accounts for right hilar chest radiographic abnormality. Lungs/Pleura: Mosaic attenuation pattern to the lungs may be due to edema, airspace disease, or air trapping. Mild atelectasis or infiltration in the right middle lung. Upper Abdomen: No acute abnormalities. Musculoskeletal: Degenerative changes in the spine. No acute bony abnormalities. IMPRESSION: 1. Cardiac enlargement. 2. Mosaic attenuation pattern to the lungs may represent edema, air trapping, or airspace infiltrates. 3. Prominent mediastinal fat likely accounts for right hilar changes on prior chest radiograph. Electronically Signed   By: Boyce Byes M.D.   On: 04/24/2024 20:11   DG Chest Port 1 View Result Date: 04/24/2024 CLINICAL DATA:  Shortness of  breath EXAM: PORTABLE CHEST 1 VIEW COMPARISON:  Chest radiograph dated 07/29/2018 FINDINGS: Low lung volumes with bronchovascular crowding. New rounded right hilar opacity. Bibasilar patchy and interstitial opacities. Trace blunting of bilateral costophrenic angles. No pneumothorax. Markedly enlarged cardiomediastinal silhouette. Bilateral healing and old rib fractures. IMPRESSION: 1. New rounded right hilar opacity, which may represent a pulmonary mass or lymphadenopathy. Recommend further evaluation with contrast-enhanced chest CT. 2. Bibasilar patchy and interstitial opacities, which may represent atelectasis, edema, or infection. 3. Markedly enlarged cardiomediastinal silhouette. 4. Trace blunting of bilateral costophrenic angles, which may represent trace pleural effusions. Electronically Signed   By: Limin  Xu M.D.   On: 04/24/2024 13:10  Labs: BNP (last 3 results) Recent Labs    04/24/24 1157  BNP 659.6*   Basic Metabolic Panel: Recent Labs  Lab 04/27/24 0433 04/28/24 0359 04/29/24 0356 04/30/24 0329 05/01/24 0355  NA 129* 130* 131* 134* 134*  K 4.3 3.9 3.9 4.3 4.5  CL 87* 89* 91* 94* 96*  CO2 34* 32 29 30 28   GLUCOSE 161* 135* 131* 165* 145*  BUN 22 21 26* 33* 38*  CREATININE 0.83 0.85 0.78 1.13* 1.08*  CALCIUM  8.7* 8.7* 8.8* 8.8* 8.8*   Liver Function Tests: Recent Labs  Lab 04/25/24 0552  AST 22  ALT 27  ALKPHOS 78  BILITOT 0.9  PROT 6.8  ALBUMIN 3.5   No results for input(s): "LIPASE", "AMYLASE" in the last 168 hours. Recent Labs  Lab 04/26/24 1820  AMMONIA 13   CBC: Recent Labs  Lab 04/27/24 0433 04/28/24 0359 04/29/24 0356 04/30/24 0329 05/01/24 0355  WBC 10.2 8.2 8.0 6.7 6.9  HGB 11.2* 12.0 12.5 12.6 12.9  HCT 38.1 40.1 40.9 42.2 43.1  MCV 83.9 83.2 82.5 83.1 82.7  PLT 395 391 385 397 407*  CBG: Recent Labs  Lab 04/30/24 1133 04/30/24 1655 04/30/24 2144 05/01/24 0745 05/01/24 1124  GLUCAP 192* 137* 185* 139* 243*      Component Value  Date/Time   COLORURINE YELLOW 04/24/2024 1136   APPEARANCEUR CLEAR 04/24/2024 1136   LABSPEC 1.005 04/24/2024 1136   PHURINE 7.0 04/24/2024 1136   GLUCOSEU NEGATIVE 04/24/2024 1136   GLUCOSEU NEGATIVE 09/08/2016 1419   HGBUR NEGATIVE 04/24/2024 1136   BILIRUBINUR NEGATIVE 04/24/2024 1136   KETONESUR NEGATIVE 04/24/2024 1136   PROTEINUR 30 (A) 04/24/2024 1136   UROBILINOGEN 0.2 09/08/2016 1419   NITRITE NEGATIVE 04/24/2024 1136   LEUKOCYTESUR NEGATIVE 04/24/2024 1136   Sepsis Labs Recent Labs  Lab 04/28/24 0359 04/29/24 0356 04/30/24 0329 05/01/24 0355  WBC 8.2 8.0 6.7 6.9   Microbiology No results found for this or any previous visit (from the past 240 hours). Time coordinating discharge: 35  minutes  SIGNED: Lesa Rape, MD  [facility] 05/01/2024, 1:21 PM  If 7PM-7AM, please contact night-coverage [email or web address]

## 2024-05-01 NOTE — Progress Notes (Signed)
 [facility] Liaison Note  Received request from [facility] for hospice services at home after discharge. Spoke with patient's caregiver Amalia Badder to initiate education related to hospice philosophy, services and team approach to care. Amalia Badder verbalized understanding of information given. Per discussion, the plan is for discharge home today.   DME needs discussed. Patient has the following equipment in the home: w/c. Family requests the following equipment for delivery: raised toilet seat  Please send signed and completed DNR home with patient/family. Please provide prescriptions at discharge as needed to ensure ongoing symptom management.  [facility] information and contact numbers given to Gregory. Please call with any concerns.  Thank you for the opportunity to participate in this patient's care.   Ardine Beckwith, LPN [facility] Liaison [phone]

## 2024-05-01 NOTE — Progress Notes (Unsigned)
 Enrolled for [employer] to mail a ZIO AT Live Telemetry monitor to patients address on file.   Letter with instructions mailed to patient.  Dr. Emmette Harms to read.

## 2024-05-01 NOTE — TOC Progression Note (Addendum)
 Transition of Care [facility]) - Progression Note    Patient Details  Name: Laura [name] MRN: [id number] Date of Birth: [birth year]  Transition of Care [facility]) CM/SW Contact  Gertha Ku, LCSW Phone Number: 05/01/2024, 8:59 AM  Clinical Narrative:    Noted consult for hospice at pt's ALF. CSW spoke with Jerlene Moody, the receptionist at [facility], who reported that the RN responsible for decisions regarding residents' return is out today. She stated she will send an email to the RN and the facility Interior and spatial designer.  CSW also spoke with the pt's POA, Amalia Badder, who confirmed that the pt has received the wheelchair. She mentioned she is also waiting to hear back from the facility. TOC to follow.  Adden  11:00am  CSW received a call from [facility] reporting contract with [facility]. Referral made to [facility] for home with hospice. They are requesting the pt's FL2  to be faxed to [phone]. They reported that the pt can return to the facility tomorrow if stable.   SW left VM with updated for Amalia Badder pt's POA. TOC to follow.    Expected Discharge Plan: Assisted Living Barriers to Discharge: Continued Medical Work up  Expected Discharge Plan and Services       Living arrangements for the past 2 months: Assisted Living Facility                                       Social Determinants of Health (SDOH) Interventions SDOH Screenings   Food Insecurity: No Food Insecurity (04/25/2024)  Housing: Low Risk  (04/25/2024)  Transportation Needs: No Transportation Needs (04/25/2024)  Utilities: Not At Risk (04/25/2024)  Social Connections: Unknown (04/25/2024)  Tobacco Use: Low Risk  (04/24/2024)    Readmission Risk Interventions     No data to display

## 2024-05-01 NOTE — Progress Notes (Signed)
 Occupational Therapy Treatment and Discharge  Patient Details Name: Laura [name] MRN: [id number] DOB: 01/17/1941 Today's Date: 05/01/2024   History of present illness Kaydynce Pat is an 83 year old female, resident of retirement community, lives with caregiver/HCPOA, ambulates with the help of a walker, communicates by sign language, medical history significant for cognitive impairment/autism spectrum, generalized anxiety disorder, bilateral deafness, HLD, HTN, hypothyroid, RLS, stroke, seizure disorder, stage IIIa CKD, DM 2 presented to the ED on 6/2 due to complaints of lower extremity swelling, dyspnea on exertion, abdominal distention and admitted for Acute on chronic diastolic CHF   OT comments  Patient seen for skilled OT treatment and discharge session. Cousin, POA and ASL Interpreter present and a visit by MD during session for care coordination. Patient open and tolerated all therapy presented. Issued and trained in simple UE HEP for home carryover. Use of rollator for typical device use at home with excellent results. See current functional status and SpO2 remained 98-100% on RA including hallway ambulation. No further OT needs identified other than support and assistance at ILF as POA mentioned was being set up. OT sign off with all goals met.       If plan is discharge home, recommend the following:  A little help with walking and/or transfers;A little help with bathing/dressing/bathroom;Assistance with cooking/housework;Direct supervision/assist for medications management;Direct supervision/assist for financial management;Assist for transportation;Help with stairs or ramp for entrance;Supervision due to cognitive status   Equipment Recommendations  None recommended by OT       Precautions / Restrictions Precautions Precautions: Fall Restrictions Weight Bearing Restrictions Per Provider Order: No       Mobility Bed Mobility Overal bed mobility: Modified Independent                   Transfers Overall transfer level: Needs assistance Equipment used: Rollator (4 wheels) Transfers: Sit to/from Stand, Bed to chair/wheelchair/BSC Sit to Stand: Modified independent (Device/Increase time)     Step pivot transfers: Modified independent (Device/Increase time)     General transfer comment: with rollator in room, supervision for whole unit hallway amb with rollator     Balance Overall balance assessment: No apparent balance deficits (not formally assessed)                                         ADL either performed or assessed with clinical judgement   ADL Overall ADL's : Needs assistance/impaired (due to novel set up setting but otherwise able to complete) Eating/Feeding: Modified independent;Sitting   Grooming: Wash/dry hands;Wash/dry face;Oral care;Sitting;Modified independent   Upper Body Bathing: Modified independent;Sitting   Lower Body Bathing: Modified independent;Sit to/from stand   Upper Body Dressing : Modified independent;Sitting   Lower Body Dressing: Modified independent;Sit to/from stand   Toilet Transfer: Modified Independent;Rollator (4 wheels)   Toileting- Clothing Manipulation and Hygiene: Modified independent;Sit to/from stand       Functional mobility during ADLs: Modified independent;Rollator (4 wheels) General ADL Comments: trial with patient's home device of rollator with significant gains this session    Extremity/Trunk Assessment Upper Extremity Assessment Upper Extremity Assessment: Right hand dominant;Overall [facility] for tasks assessed   Lower Extremity Assessment Lower Extremity Assessment: Overall WFL for tasks assessed        Vision   Vision Assessment?: Wears glasses for reading;No apparent visual deficits         Communication Communication Communication: Impaired Factors Affecting Communication:  Hearing impaired;Other (comment) (ASL interpreter present)   Cognition Arousal:  Alert Behavior During Therapy: WFL for tasks assessed/performed Cognition: History of cognitive impairments             OT - Cognition Comments: interpreter for ASL, guardian and cousin all present with verification of baseline cog status                 Following commands: Intact        Cueing   Cueing Techniques: Gestural cues, Tactile cues, Visual cues  Exercises Exercises: Other exercises (foam grasp ball squeeze traiing with family and POA carryover for home use 25 reps Bly)       General Comments B LE edema resolved, SpO2 98-100% SpO2 on RA before and after unit amb    Pertinent Vitals/ Pain       Pain Assessment Pain Assessment: No/denies pain   Progress Toward Goals  OT Goals(current goals can now be found in the care plan section)  Progress towards OT goals: Goals met/education completed, patient discharged from OT      AM-PAC OT "6 Clicks" Daily Activity     Outcome Measure   Help from another person eating meals?: None Help from another person taking care of personal grooming?: None Help from another person toileting, which includes using toliet, bedpan, or urinal?: None Help from another person bathing (including washing, rinsing, drying)?: None Help from another person to put on and taking off regular upper body clothing?: None Help from another person to put on and taking off regular lower body clothing?: None 6 Click Score: 24    End of Session Equipment Utilized During Treatment: Gait belt;Rollator (4 wheels)  OT Visit Diagnosis: Unsteadiness on feet (R26.81)   Activity Tolerance Patient tolerated treatment well   Patient Left in chair;with call bell/phone within reach;with chair alarm set   Nurse Communication Mobility status;Other (comment) (SpO2 on RA)        Time: 0925-1000 OT Time Calculation (min): 35 min  Charges: OT General Charges $OT Visit: 1 Visit OT Treatments $Therapeutic Activity: 23-37 mins  Dashanique Brownstein OT/L Acute  Rehabilitation Department  [phone]  05/01/2024, 11:37 AM

## 2024-05-01 NOTE — NC FL2 (Signed)
 [facility]  MEDICAID FL2 LEVEL OF CARE FORM     IDENTIFICATION  Patient Name: Laura [name] Birthdate: 22-Jun-1941 Sex: female Admission Date (Current Location): 04/24/2024  [facility] [place] and [place] Number:  Producer, television/film/video and Address:  [facility],  [address] [place] [address]      Provider Number: [id number]  Attending Physician Name and Address:  Lesa Rape, MD  Relative Name and Phone Number:  Bettyjane Brunet (Other)  [phone] (Work Phone    Current Level of Care: Hospital Recommended Level of Care: Assisted Living Facility Prior Approval Number:    Date Approved/Denied:   PASRR Number:    Discharge Plan: Other (Comment) (ALF)    Current Diagnoses: Patient Active Problem List   Diagnosis Date Noted   DCM (dilated cardiomyopathy) (HCC) 05/01/2024   Pure hypercholesterolemia 05/01/2024   Sinus pause 05/01/2024   Acute congestive heart failure (HCC) 04/29/2024   DNR (do not resuscitate) 04/28/2024   Need for emotional support 04/28/2024   Goals of care, counseling/discussion 04/28/2024   Counseling and coordination of care 04/27/2024   Palliative care encounter 04/27/2024   Acute HFrEF (heart failure with reduced ejection fraction) (HCC) 04/26/2024   Volume overload 04/24/2024   Asymptomatic bacteriuria 04/24/2024   Hyponatremia 04/24/2024   Cardiomegaly 04/24/2024   Acute ischemic left anterior cerebral artery (ACA) stroke (HCC) 07/19/2018   History of CVA (cerebrovascular accident) without residual deficits    Sinus tachycardia    Diabetes mellitus type 2 in nonobese [facility])    Acute encephalopathy 07/14/2018   Autism spectrum disorder 07/14/2018   Deaf 07/14/2018   Bradycardia 05/16/2018   Cognitive changes 06/03/2017   Normocytic anemia 05/18/2017   CVA (cerebral vascular accident) (HCC) 04/06/2017   Bleeding, intracranial (HCC)    Sepsis (HCC)    Subdural hematoma (HCC)    Trauma    Osteoporosis 03/23/2017   Menopause 03/25/2016    Hyperglycemia 08/07/2015   Contusion of left chest wall 02/05/2014   Routine general medical examination at a health care facility 01/19/2014   Left ventricular hypertrophy 07/15/2011   Encounter for long-term (current) use of other medications 06/29/2011   PARONYCHIA, RIGHT GREAT TOE 12/23/2009   Trigger finger, acquired 10/30/2009   Hypothyroidism 04/15/2009   ABNORMAL ELECTROCARDIOGRAM 04/15/2009   RESTLESS LEG SYNDROME 10/01/2008   Headache 08/27/2008   TACHYCARDIA 08/27/2008   Primary hypertension 05/07/2008   Abnormal involuntary movement 11/21/2007   Anxiety state 10/10/2007   Dyslipidemia 06/22/2007    Orientation RESPIRATION BLADDER Height & Weight     Self  Normal Continent Weight: 139 lb 5.3 oz (63.2 kg) Height:  5\' 2"  (157.5 cm)  BEHAVIORAL SYMPTOMS/MOOD NEUROLOGICAL BOWEL NUTRITION STATUS      Continent Diet (Heart Healthy)  AMBULATORY STATUS COMMUNICATION OF NEEDS Skin   Independent Non-Verbally (Sign Language) Normal                       Personal Care Assistance Level of Assistance  Bathing, Feeding, Dressing Bathing Assistance: Limited assistance Feeding assistance: Independent Dressing Assistance: Limited assistance     Functional Limitations Info  Sight, Hearing, Speech Sight Info: Impaired (Glasses) Hearing Info: Impaired (death) Speech Info: Impaired    SPECIAL CARE FACTORS FREQUENCY                       Contractures Contractures Info: Not present    Additional Factors Info  Code Status, Allergies Code Status Info: DNR Allergies Info: No Known  Allergies           Current Medications (05/01/2024):  This is the current hospital active medication list Current Facility-Administered Medications  Medication Dose Route Frequency Provider Last Rate Last Admin   acetaminophen  (TYLENOL ) tablet 650 mg  650 mg Oral Q6H PRN Danice Dural, MD   650 mg at 04/29/24 1646   Or   acetaminophen  (TYLENOL ) suppository 650 mg  650 mg Rectal  Q6H PRN Danice Dural, MD       albuterol  (PROVENTIL ) (2.5 MG/3ML) 0.083% nebulizer solution 2.5 mg  2.5 mg Nebulization Q2H PRN Lesa Rape, MD   2.5 mg at 04/27/24 0434   aspirin  chewable tablet 81 mg  81 mg Oral Daily Danice Dural, MD   81 mg at 05/01/24 0845   atorvastatin  (LIPITOR ) tablet 80 mg  80 mg Oral QHS Danice Dural, MD   80 mg at 04/30/24 2100   cholecalciferol  (VITAMIN D3) 25 MCG (1000 UNIT) tablet 1,000 Units  1,000 Units Oral Daily Danice Dural, MD   1,000 Units at 05/01/24 0845   clopidogrel  (PLAVIX ) tablet 75 mg  75 mg Oral Daily Danice Dural, MD   75 mg at 05/01/24 0844   dapagliflozin  propanediol (FARXIGA ) tablet 10 mg  10 mg Oral Daily Tobb, Kardie, DO   10 mg at 05/01/24 0847   enoxaparin  (LOVENOX ) injection 40 mg  40 mg Subcutaneous Q24H Danice Dural, MD   40 mg at 04/30/24 2102   fluticasone  (FLONASE ) 50 MCG/ACT nasal spray 2 spray  2 spray Each Nare Daily Ortiz, David Manuel, MD   2 spray at 05/01/24 1610   furosemide  (LASIX ) tablet 40 mg  40 mg Oral Daily McDowell, Samuel G, MD   40 mg at 05/01/24 0845   insulin  aspart (novoLOG ) injection 0-6 Units  0-6 Units Subcutaneous TID WC Lesa Rape, MD   2 Units at 05/01/24 1157   labetalol  (NORMODYNE ) injection 10 mg  10 mg Intravenous Q2H PRN Hongalgi, Anand D, MD       levETIRAcetam  (KEPPRA ) tablet 500 mg  500 mg Oral BID Danice Dural, MD   500 mg at 05/01/24 0845   levothyroxine  (SYNTHROID ) tablet 75 mcg  75 mcg Oral QAC breakfast Danice Dural, MD   75 mcg at 05/01/24 0442   lip balm (CARMEX) ointment   Topical PRN Hongalgi, Anand D, MD       loratadine  (CLARITIN ) tablet 10 mg  10 mg Oral q AM Danice Dural, MD   10 mg at 05/01/24 0513   naphazoline-glycerin  (CLEAR EYES REDNESS) ophth solution 1 drop  1 drop Both Eyes BID PRN Danice Dural, MD   1 drop at 04/30/24 2304   ondansetron  (ZOFRAN ) tablet 4 mg  4 mg Oral Q6H PRN Danice Dural, MD       Or    ondansetron  (ZOFRAN ) injection 4 mg  4 mg Intravenous Q6H PRN Danice Dural, MD       Oral care mouth rinse  15 mL Mouth Rinse PRN Danice Dural, MD       polyethylene glycol (MIRALAX  / GLYCOLAX ) packet 17 g  17 g Oral Daily Hongalgi, Anand D, MD   17 g at 05/01/24 9604   potassium chloride  (KLOR-CON ) packet 40 mEq  40 mEq Oral Daily Lesa Rape, MD   40 mEq at 05/01/24 0845   rOPINIRole  (REQUIP ) tablet 2 mg  2 mg Oral QHS Danice Dural, MD  2 mg at 04/30/24 2100   sacubitril -valsartan  (ENTRESTO ) 49-51 mg per tablet  1 tablet Oral BID Adams, Zane, PA-C       senna-docusate (Senokot-S) tablet 1 tablet  1 tablet Oral BID Hongalgi, Anand D, MD   1 tablet at 05/01/24 0845   spironolactone  (ALDACTONE ) tablet 12.5 mg  12.5 mg Oral Daily Goodrich, Callie E, PA-C   12.5 mg at 05/01/24 9811     Discharge Medications: Medication List       TAKE these medications     acetaminophen  325 MG tablet Commonly known as: TYLENOL  Take 1-2 tablets (325-650 mg total) by mouth every 4 (four) hours as needed for mild pain. What changed:  when to take this reasons to take this    alendronate  70 MG tablet Commonly known as: FOSAMAX  TAKE 1 TABLET BY MOUTH ONCE A WEEK .  TAKE  WITH  A  FULL  GLASS  OF  WATER  ON  AN  EMPTY  STOMACH What changed:  how much to take how to take this when to take this additional instructions    aspirin  81 MG chewable tablet Chew 81 mg by mouth daily.    atorvastatin  80 MG tablet Commonly known as: LIPITOR  Take 1 tablet (80 mg total) by mouth daily at 6 PM. What changed: when to take this    clopidogrel  75 MG tablet Commonly known as: PLAVIX  Take 1 tablet (75 mg total) by mouth daily.    dapagliflozin  propanediol 10 MG Tabs tablet Commonly known as: FARXIGA  Take 1 tablet (10 mg total) by mouth daily. Start taking on: May 02, 2024    fluticasone  50 MCG/ACT nasal spray Commonly known as: FLONASE  Place 2 sprays into both nostrils in the morning and  at bedtime.    furosemide  40 MG tablet Commonly known as: LASIX  Take 1 tablet (40 mg total) by mouth daily. Start taking on: May 02, 2024 What changed:  medication strength how much to take when to take this    levETIRAcetam  500 MG tablet Commonly known as: KEPPRA  Take 1 tablet (500 mg total) by mouth 2 (two) times daily.    levothyroxine  75 MCG tablet Commonly known as: SYNTHROID  Take 75 mcg by mouth daily before breakfast.    loratadine  10 MG tablet Commonly known as: CLARITIN  Take 1 tablet (10 mg total) by mouth daily as needed for allergies. What changed: when to take this    metFORMIN  500 MG 24 hr tablet Commonly known as: GLUCOPHAGE -XR Take 1 tablet (500 mg total) by mouth daily with breakfast. What changed: how much to take    multivitamin tablet Take 1 tablet by mouth daily with breakfast.    polyethylene glycol 17 g packet Commonly known as: MIRALAX  / GLYCOLAX  Take 17 g by mouth daily. Start taking on: May 02, 2024    potassium chloride  20 MEQ packet Commonly known as: KLOR-CON  Take 40 mEq by mouth daily. Start taking on: May 02, 2024    rOPINIRole  2 MG tablet Commonly known as: REQUIP  Take 2 mg by mouth at bedtime.    sacubitril -valsartan  49-51 MG Commonly known as: ENTRESTO  Take 1 tablet by mouth 2 (two) times daily.    senna-docusate 8.6-50 MG tablet Commonly known as: Senokot-S Take 1 tablet by mouth 2 (two) times daily.    spironolactone  25 MG tablet Commonly known as: ALDACTONE  Take 0.5 tablets (12.5 mg total) by mouth daily. Start taking on: May 02, 2024    Systane 0.4-0.3 % Soln Generic drug: Polyethyl  Glycol-Propyl Glycol Place 1 drop into both eyes See admin instructions. Instill 1 drop into both eyes twice a day SCHEDULED and up to an additional dose every 12 hours as needed for dryness    Vitamin D3 25 MCG (1000 UT) Caps Take 1,000 Units by mouth daily.      Relevant Imaging Results:  Relevant Lab Results:   Additional  Information SSN:[id number]  Arta Lark Katelee Schupp, LCSW

## 2024-05-01 NOTE — TOC Transition Note (Signed)
 Transition of Care [facility]) - Discharge Note   Patient Details  Name: Laura [name] MRN: [id number] Date of Birth: 07-22-1941  [facility]) CM/SW Contact:  Gertha Ku, LCSW Phone Number: 05/01/2024, 2:42 PM   Clinical Narrative:     CSW spoke with Shakeitha with [facility] she has confirmed she has received pt's FL2 and d/c summary. She reports pt can d/c back today instead of tomorrow. Pt to d/c back to [facility] with hospice services. Pt's family will provide transport at time of d/c. No further TOC needs , TOC sign off.  Final next level of care: Assisted Living (with hospice)    Patient Goals and CMS Choice            Discharge Placement                  Name of family member notified: Bettyjane Brunet (Other)  [phone] (Work Phone) Patient and family notified of of transfer: 05/01/24  Discharge Plan and Services Additional resources added to the After Visit Summary for                                       Social Drivers of Health (SDOH) Interventions SDOH Screenings   Food Insecurity: No Food Insecurity (04/25/2024)  Housing: Low Risk  (04/25/2024)  Transportation Needs: No Transportation Needs (04/25/2024)  Utilities: Not At Risk (04/25/2024)  Social Connections: Unknown (04/25/2024)  Tobacco Use: Low Risk  (04/24/2024)     Readmission Risk Interventions     No data to display

## 2024-05-01 NOTE — Care Management Important Message (Signed)
 Important Message  Patient Details No IM Letter given due to discharging to Hospice.  Name: Laura [name] MRN: [id number] Date of Birth: 21-Jan-1941   Important Message Given:  No     Curtiss Dowdy 05/01/2024, 3:04 PM

## 2024-05-01 NOTE — Progress Notes (Signed)
 Daily Progress Note   Patient Name: Laura [name]       Date: 05/01/2024 DOB: [birth year]  Age: 83 y.o. MRN#: [id number] Attending Physician: Lesa Rape, MD Primary Care Physician: Helyn Lobstein, MD Admit Date: 04/24/2024 Length of Stay: 6 days  Reason for Consultation/Follow-up: Establishing goals of care  Subjective:   Reviewed EMR prior to presenting to bedside.  Discussed care with hospitalist and TOC to coordinate care.  Patient able to discharge back to [name] today with hospice services.  Presented to bedside to see patient.  Patient sitting up in bedside chair comfortably.  Patient's nephew and Laura [name], Laura [name], present at bedside.  Patient pleasant and interacting easily at this time. Able to speak with patient's HCPOA, Laura [name], regarding completion of MOST form.  Reviewed goals for patient's comfort moving forward.  Patient to return to long-term care with hospice support layered in.  Do not want patient to have to return to the hospital as the hospital setting is distressing to her.  Hoping patient can enjoy quality time outside of the hospital.  Provided Laura [name] with completed MOST and DNR form.  Also completed MOST form.  DNR form to be scanned into EMR.  All questions answered at that time.  Noted palliative medicine team would be available if needed.  Objective:   Vital Signs:  BP (!) 163/72 (BP Location: Right Arm)   Pulse 69   Temp 97.6 F (36.4 C) (Oral)   Resp 18   Ht 5\' 2"  (1.575 m)   Wt 63.2 kg   SpO2 96%   BMI 25.48 kg/m   Physical Exam: General: NAD, awake, chronically ill-appearing, laying in bed Cardiovascular: RRR Respiratory: no increased work of breathing noted, not in respiratory distress Neuro: Awake, interactive  Assessment & Plan:   Assessment: Patient is an 83 year old female with a past medical history of cognitive impairment/autism spectrum, generalized anxiety disorder, bilateral deafness, hyperlipidemia, hypertension,  hypothyroidism, RLS, CVA, seizure disorder, CKD, diabetes mellitus who was admitted on 04/24/2024 for management of lower extremity swelling, dyspnea on exertion, and abdominal distention. Patient normally [facility] in [facility] with caregiver/HCPOA. Patient communicates with sign language and previously was able to ambulate with help of the walker. During hospitalization, patient found to have acute on chronic heart failure. Prior EF had shown diastolic heart failure with EF of 60% though repeat echo now showing EF 30-35%. Cardiology consulted for recommendations. Palliative medicine team consulted to assist with complex medical decision making.   Recommendations/Plan: # Complex medical decision making/goals of care:  - Patient unable to participate in complex medical decision making due to underlying medical status.  - Have discussed care with patient's HCPOA, Laura [name], and cousin, Laura [name].  Patient has comprehension of approximately 82-53-year-old if this.  Patient is scared by aggressive medical interventions in the hospital. Laura [name] noted quality time for patient moving forward would be in a long-term care facility to establish routine in people she is familiar with and not returning to the hospital for further medical interventions.  Patient returning to long-term care facility with hospice layered in.  Provided HCPOA with completed DNR and MOST form.  Also placed completed DNR and MOST form in patient's paper chart to return to facility.  -  Code Status: Limited: Do not attempt resuscitation (DNR) -DNR-LIMITED -Do Not Intubate/DNI   # Psychosocial Support:  -HCPOA- Laura [name]   # Discharge Planning: Skilled Nursing Facility with Hospice  Thank you for allowing the palliative care team to participate  in the care Laura [name].  Barnett Libel, DO Palliative Care Provider PMT # [phone]  If patient remains symptomatic despite maximum doses, please call PMT at [phone] between  0700 and 1900. Outside of these hours, please call attending, as PMT does not have night coverage.

## 2024-05-01 NOTE — Progress Notes (Addendum)
  Progress Note  Patient Name: Laura [name] Date of Encounter: 05/01/2024 [facility] Cardiologist: Kardie Tobb, DO   Interval Summary   Patient has autism, is deaf, and is a poor historian.  In the visit the patient expressed her desire to go home.  Denied any shortness of breath or lightheadedness.  Vital Signs Vitals:   04/30/24 1451 04/30/24 2146 05/01/24 0354 05/01/24 0401  BP: (!) 151/74 (!) 147/80 (!) 163/72   Pulse: 65 70 69   Resp: 18     Temp: 98.1 F (36.7 C) 98 F (36.7 C) 97.6 F (36.4 C)   TempSrc: Oral Oral Oral   SpO2: 93% 96% 96%   Weight:    63.2 kg  Height:        Intake/Output Summary (Last 24 hours) at 05/01/2024 1031 Last data filed at 05/01/2024 0900 Gross per 24 hour  Intake 934 ml  Output 1575 ml  Net -641 ml      05/01/2024    4:01 AM 04/30/2024    7:07 AM 04/29/2024    9:00 AM  Last 3 Weights  Weight (lbs) 139 lb 5.3 oz 140 lb 10.5 oz 152 lb 8.9 oz  Weight (kg) 63.2 kg 63.8 kg 69.2 kg      Telemetry/ECG  Normal sinus rhythm with rates typically in the 50s to 70s.  Multiple pauses were observed overnight typically lasting about 2.5 seconds.  Pauses happened at about 11:-12  PM and about 3-3:30 AM. also has episodes of junctional bradycardia around the same time as the pauses. No pauses were seen during the day - Personally Reviewed  Physical Exam  GEN: No acute distress.   Neck: No JVD Cardiac: RRR, no murmurs, rubs, or gallops.  Respiratory: Clear to auscultation bilaterally. GI: Soft, nontender, non-distended  MS: No edema  Assessment & Plan  BO ROGUE is a 83 y.o. female with a hx of deafness, Autism, hyperlipidemia, hypertension, hypothyroidism, restless leg syndrome, history of CVA, type 2 diabetes who is being seen 04/25/2024 for the evaluation of cardiomyopathy at the request of Dr. Sherrod Dolphin.  Acute HFrEF  echocardiogram this admission demonstrates LVEF 30 to 35% with global hypokinesis and septal motion consistent with  left bundle branch block.  RV contraction also moderately reduced.  Clinically improving with IV diuresis.  I/O are net out 7.8 L.  Blood pressures overnight have been slightly elevated Continue oral Lasix  40 mg daily - Schedule outpatient follow-up with cardiology in 2 weeks.  GDMT Stop Coreg  3.125 twice daily Continue Farxiga  10 mg daily Increase Entresto  49-51 twice daily Continue spironolactone  12.5 daily   Primary hypertension Stop amlodipine  5 mg daily.  Manage GDMT as above  History of stroke Mixed hyperlipidemia.   - Continue Lipitor  80 mg daily. - Continues on aspirin  81 mg daily and Plavix  75 mg daily.   Intermittent sinus pauses and junctional bradycardia  Episodes happened while sleeping.  Not obviously symptomatic otherwise.  Potentially associated with undiagnosed OSA.  Hold off ordering outpatient sleep study as patient is not a good candidate for CPAP. - Order 14-day outpatient cardiac monitor - Stop Coreg .  As has continued to have some pauses on monitor.      For questions or updates, please contact [facility] Please consult [email or web address] for contact info under       Signed, Jolee Critcher, PA-C

## 2024-05-01 NOTE — Progress Notes (Signed)
 1 week post hospital needs BMET.

## 2024-05-08 DIAGNOSIS — Z79899 Other long term (current) drug therapy: Secondary | ICD-10-CM | POA: Diagnosis not present

## 2024-05-08 NOTE — Progress Notes (Signed)
 Cardiology Office Note   Date:  05/22/2024  ID:  Calissa, Swenor 1941/03/10, MRN [id number] PCP: Aisha Harvey, MD  [facility] Providers Cardiologist:  Dub Huntsman, DO    History of Present Illness ADEAN MILOSEVIC is a 83 y.o. female with a past medical history of deafness, Autism, hyperlipidemia, hypertension, hypothyroidism, restless leg syndrome, history of CVA, type 2 diabetes, cardiomyopathy. She presents today for a hospital follow up   Patient was recently admitted from 6/2-6/9. Patient presented to the ED on 6/2 for evaluation of bilateral leg swelling, pain, redness. Caregiver also reported increased shortness of breath.  Labs in the ED showed NA 127, creatinine 0.85, WBC 10.1, hemoglobin 11.7.  BNP elevated to 659. Chest x-ray showed new rounded right hilar opacity, bibasilar patchy and interstitial opacities, markedly enlarged cardiomediastinal silhouette.  CT chest showed cardiac enlargement, mosaic attenuation pattern to the lungs which may represent edema, air trapping, and airspace infiltrates.  There was prominent mediastinal fat that likely accounted for right hilar changes on prior chest x-ray. She was admitted to the internal medicine service for acute on chronic heart failure, bilateral leg cellulitis, hyponatremia. Echocardiogram 04/26/24 showed EF 30-35%, septal dyssynergy in the setting of LBBB, moderately reduced RV systolic function, mild MR. She was started on entresto , farxiga , lasix , spironolactone . Was started on carvedilol  but had bradycardia/pauses when asleep. Was discharged with an outpatient monitor. After discussion with POA, it was felt that it would be best to avoid invasive procedures and pursue medical management   Patient accompanied by her power of attorney.  An ASL translator present throughout the duration of the visit.  Patient reports that she has been feeling well since being discharged from the hospital.  Denies shortness of breath.  Has some  lower extremity swelling that comes and goes, today is a pretty good day for her swelling.  Denies dizziness, syncope, near syncope.  Her power of attorney reports that she is starting hospice care to have an extra set of eyes on her.  Due to patient's autism and use of sign language, the people who care for her sometimes do not know how to communicate with her effectively.  POA concerned that sometimes symptoms go missed or unnoticed.  Studies Reviewed Cardiac Studies & Procedures   ______________________________________________________________________________________________     ECHOCARDIOGRAM  ECHOCARDIOGRAM COMPLETE 04/26/2024  Narrative ECHOCARDIOGRAM REPORT    Patient Name:   ALLIYA MARCON Date of Exam: 04/26/2024 Medical Rec #:  [id number]       Height:       62.0 in Accession #:    [id number]      Weight:       147.1 lb Date of Birth:  22-Oct-1941       BSA:          1.678 m Patient Age:    83 years        BP:           141/66 mmHg Patient Gender: F               HR:           81 bpm. Exam Location:  Inpatient  Procedure: 2D Echo, Color Doppler and Cardiac Doppler (Both Spectral and Color Flow Doppler were utilized during procedure).  Indications:    CHF  History:        Patient has prior history of Echocardiogram examinations, most recent 07/15/2018. Risk Factors:Diabetes.  Sonographer:    Benard Stallion Referring Phys: [phone]  DAVID MANUEL ORTIZ  IMPRESSIONS   1. Septal dyssynerg in setting of LBBB. Left ventricular ejection fraction, by estimation, is 30 to 35%. The left ventricle has moderately decreased function. The left ventricle demonstrates global hypokinesis. There is mild concentric left ventricular hypertrophy. Left ventricular diastolic parameters are consistent with Grade I diastolic dysfunction (impaired relaxation). 2. Right ventricular systolic function is moderately reduced. The right ventricular size is normal. 3. Left atrial size was mildly  dilated. 4. Right atrial size was mildly dilated. 5. The mitral valve is degenerative. Mild mitral valve regurgitation. No evidence of mitral stenosis. 6. The aortic valve is tricuspid. There is mild calcification of the aortic valve. Aortic valve regurgitation is not visualized. Aortic valve sclerosis/calcification is present, without any evidence of aortic stenosis. 7. The inferior vena cava is normal in size with greater than 50% respiratory variability, suggesting right atrial pressure of 3 mmHg.  FINDINGS Left Ventricle: Septal dyssynerg in setting of LBBB. Left ventricular ejection fraction, by estimation, is 30 to 35%. The left ventricle has moderately decreased function. The left ventricle demonstrates global hypokinesis. The left ventricular internal cavity size was normal in size. There is mild concentric left ventricular hypertrophy. Abnormal (paradoxical) septal motion, consistent with left bundle branch block. Left ventricular diastolic parameters are consistent with Grade I diastolic dysfunction (impaired relaxation).  Right Ventricle: The right ventricular size is normal. No increase in right ventricular wall thickness. Right ventricular systolic function is moderately reduced.  Left Atrium: Left atrial size was mildly dilated.  Right Atrium: Right atrial size was mildly dilated.  Pericardium: There is no evidence of pericardial effusion.  Mitral Valve: The mitral valve is degenerative in appearance. Mild mitral valve regurgitation. No evidence of mitral valve stenosis.  Tricuspid Valve: The tricuspid valve is normal in structure. Tricuspid valve regurgitation is mild . No evidence of tricuspid stenosis.  Aortic Valve: The aortic valve is tricuspid. There is mild calcification of the aortic valve. Aortic valve regurgitation is not visualized. Aortic valve sclerosis/calcification is present, without any evidence of aortic stenosis.  Pulmonic Valve: The pulmonic valve was normal  in structure. Pulmonic valve regurgitation is not visualized. No evidence of pulmonic stenosis.  Aorta: The aortic root is normal in size and structure.  Venous: The inferior vena cava is normal in size with greater than 50% respiratory variability, suggesting right atrial pressure of 3 mmHg.  IAS/Shunts: No atrial level shunt detected by color flow Doppler.    LV Volumes (MOD) LV vol d, MOD A2C: 94.2 ml LV vol d, MOD A4C: 95.3 ml LV vol s, MOD A2C: 56.0 ml LV vol s, MOD A4C: 51.6 ml LV SV MOD A2C:     38.2 ml LV SV MOD A4C:     95.3 ml LV SV MOD BP:      38.9 ml  Toribio Fuel MD Electronically signed by Toribio Fuel MD Signature Date/Time: 04/26/2024/8:22:14 PM    Final          ______________________________________________________________________________________________       Risk Assessment/Calculations           Physical Exam VS:  BP 118/62   Pulse 68   Ht 4' 11 (1.499 m)   Wt 136 lb 3.2 oz (61.8 kg)   SpO2 94%   BMI 27.51 kg/m    Wt Readings from Last 3 Encounters:  05/22/24 136 lb 3.2 oz (61.8 kg)  05/01/24 139 lb 5.3 oz (63.2 kg)  10/06/21 137 lb 9.6 oz (62.4 kg)  GEN: Well nourished, well developed in no acute distress. Sitting upright on the exam table  NECK: No JVD  CARDIAC:  RRR, no murmurs, rubs, gallops RESPIRATORY:  Clear to auscultation without rales, wheezing or rhonchi. Normal WOB on room air  ABDOMEN: Soft, non-tender, non-distended EXTREMITIES:  Mild edema in BLE; No deformity   ASSESSMENT AND PLAN   Chronic HFrEF  LBBB Mild MR  - Patient recently admitted with bilateral leg cellulitis/swelling, shortness of breath. Echo 04/26/24 showed EF 30-35%, septal dyssynergy in the setting of LBBB, moderately reduced RV systolic function, mild MR  - During recent admission, patient was diuresed with IV lasix . Discharged on PO lasix  40 mg daily - Patient has mild ankle edema but otherwise appears euvolemic and lungs are clear. She is  on the Autism spectrum, and her POA reports that she often does not complain of symptoms, even when something is wrong. POA has noted some shortness of breath and leg swelling  - Ordered BNP to further evaluate volume status given patient's communication difficulties  - Continue lasix  40 mg daily  - Continue farxiga  10 mg daily  - Continue entresto  49-51 mg BID  - Continue spironolactone  12.5 mg daily  - No BB with history of sinus pauses during recent admission  - Per discussion with POA during recent admission- pursuing medical management and avoiding invasive procedures  - Ordered BMP for medication monitoring   HTN  -  BP well controlled. No dizziness, syncope, near syncope  - Continue GDMT as above  - BMP for medication monitoring   HLD  History of CVA  - On statin therapy for history of CVA. Labs followed by PCP  - Continue lipitor  80 mg daily  - Continue ASA, plavix    Sinus Pauses  - When on carvedilol , patient had occasional sinus pauses less than 3 seconds during sleep. Was asymptomatic. Possibly related to untreated OSA but given her age and autism, decided not to pursue sleep study. Carvedilol  held  - Cardiac monitor was ordered at discharge, but patient could not tolerate monitor and took it off. HR is 68 BPM today in clinic. Denies syncope, near syncope, fatigue - Will hold off repeat monitor for now as patient could not tolerate. Continue to avoid AV nodal medications    Dispo: Follow up in 2 months with Dr. Sheena   Signed, Rollo FABIENE Louder, PA-C

## 2024-05-15 DIAGNOSIS — R718 Other abnormality of red blood cells: Secondary | ICD-10-CM | POA: Diagnosis not present

## 2024-05-15 DIAGNOSIS — E875 Hyperkalemia: Secondary | ICD-10-CM | POA: Diagnosis not present

## 2024-05-22 ENCOUNTER — Encounter: Payer: Self-pay | Admitting: Cardiology

## 2024-05-22 ENCOUNTER — Ambulatory Visit: Attending: Cardiology | Admitting: Cardiology

## 2024-05-22 VITALS — BP 118/62 | HR 68 | Ht 59.0 in | Wt 136.2 lb

## 2024-05-22 DIAGNOSIS — I63422 Cerebral infarction due to embolism of left anterior cerebral artery: Secondary | ICD-10-CM | POA: Diagnosis not present

## 2024-05-22 DIAGNOSIS — I5022 Chronic systolic (congestive) heart failure: Secondary | ICD-10-CM

## 2024-05-22 DIAGNOSIS — I10 Essential (primary) hypertension: Secondary | ICD-10-CM

## 2024-05-22 DIAGNOSIS — I340 Nonrheumatic mitral (valve) insufficiency: Secondary | ICD-10-CM | POA: Diagnosis not present

## 2024-05-22 DIAGNOSIS — I447 Left bundle-branch block, unspecified: Secondary | ICD-10-CM

## 2024-05-22 DIAGNOSIS — I455 Other specified heart block: Secondary | ICD-10-CM | POA: Diagnosis not present

## 2024-05-22 DIAGNOSIS — I5021 Acute systolic (congestive) heart failure: Secondary | ICD-10-CM | POA: Diagnosis not present

## 2024-05-22 NOTE — Patient Instructions (Signed)
 Medication Instructions:  No changes *If you need a refill on your cardiac medications before your next appointment, please call your pharmacy*  Lab Work: Today we are going to draw a Bmet, and BNP If you have labs (blood work) drawn today and your tests are completely normal, you will receive your results only by: MyChart Message (if you have MyChart) OR A paper copy in the mail If you have any lab test that is abnormal or we need to change your treatment, we will call you to review the results.  Testing/Procedures: No testing  Follow-Up: At [facility], you and your health needs are our priority.  As part of our continuing mission to provide you with exceptional heart care, our providers are all part of one team.  This team includes your primary Cardiologist (physician) and Advanced Practice Providers or APPs (Physician Assistants and Nurse Practitioners) who all work together to provide you with the care you need, when you need it.  Your next appointment:   3 month(s)  Provider:   Kardie Tobb, DO    We recommend signing up for the patient portal called MyChart.  Sign up information is provided on this After Visit Summary.  MyChart is used to connect with patients for Virtual Visits (Telemedicine).  Patients are able to view lab/test results, encounter notes, upcoming appointments, etc.  Non-urgent messages can be sent to your provider as well.   To learn more about what you can do with MyChart, go to [email or web address].

## 2024-05-23 ENCOUNTER — Ambulatory Visit: Payer: Self-pay | Admitting: Cardiology

## 2024-05-23 ENCOUNTER — Telehealth: Payer: Self-pay | Admitting: Student in an Organized Health Care Education/Training Program

## 2024-05-23 DIAGNOSIS — E875 Hyperkalemia: Secondary | ICD-10-CM

## 2024-05-23 DIAGNOSIS — I447 Left bundle-branch block, unspecified: Secondary | ICD-10-CM

## 2024-05-23 DIAGNOSIS — I5022 Chronic systolic (congestive) heart failure: Secondary | ICD-10-CM

## 2024-05-23 DIAGNOSIS — I455 Other specified heart block: Secondary | ICD-10-CM

## 2024-05-23 LAB — BASIC METABOLIC PANEL WITH GFR
BUN/Creatinine Ratio: 22 (ref 12–28)
BUN: 24 mg/dL (ref 8–27)
CO2: 22 mmol/L (ref 20–29)
Calcium: 9.3 mg/dL (ref 8.7–10.3)
Chloride: 99 mmol/L (ref 96–106)
Creatinine, Ser: 1.08 mg/dL — ABNORMAL HIGH (ref 0.57–1.00)
Glucose: 80 mg/dL (ref 70–99)
Potassium: 5.8 mmol/L (ref 3.5–5.2)
Sodium: 135 mmol/L (ref 134–144)
eGFR: 52 mL/min/{1.73_m2} — ABNORMAL LOW (ref >59)

## 2024-05-23 LAB — BRAIN NATRIURETIC PEPTIDE: BNP: 92.7 pg/mL (ref 0.0–100.0)

## 2024-05-23 NOTE — Telephone Encounter (Signed)
 Left message to call back

## 2024-05-23 NOTE — Telephone Encounter (Signed)
 Please tell patient that her renal function is stable. However, her potassium is elevated to 5.8. She should not take her spironolactone  today. It is possible that this is a lab error as she has never had high potassium in the past. Please order repeat BMP to be collected today Note- call goes to her POA [facility]    Thanks KJ   Talked with Rollo Louder PA-C she will just order a potassium level to be preformed at [facility]

## 2024-05-23 NOTE — Telephone Encounter (Signed)
 Attempted to call patient regarding hyperK. Left VM and placed repeat BMP.

## 2024-05-24 NOTE — Telephone Encounter (Signed)
-----   Message from Rollo FABIENE Louder sent at 05/23/2024  7:33 AM EDT ----- Laura [name]-   Please tell patient that her renal function is stable. However, her potassium is elevated to 5.8. She should not take her spironolactone  today. It is possible that this is a lab error as she has  never had high potassium in the past. Please order repeat BMP to be collected today Note- call goes to her POA [name]   Thanks KJ  ----- Message ----- From: Rebecka Memos Lab Results In Sent: 05/23/2024   6:37 AM EDT To: Rollo JONELLE Louder, PA-C

## 2024-05-24 NOTE — Telephone Encounter (Signed)
 Called patient nursing home left message If patient is unable to have labs done until Monday please have patient hold Spironolactone  until labs are drawn. Per Rollo Louder PA-C

## 2024-05-24 NOTE — Telephone Encounter (Signed)
 [facility]) is returning call

## 2024-05-24 NOTE — Telephone Encounter (Signed)
 Currently reaching out to patients facility [name]) Left Message with [name] If we receive a call back please send message [name] CMA as he needs to discuss with nurse about meds and labs.

## 2024-05-25 NOTE — Telephone Encounter (Signed)
 Called patient nursing home left message If patient is unable to have labs done until Monday please have patient hold Spironolactone  until labs are drawn. Per Rollo Louder PA-C

## 2024-05-25 NOTE — Telephone Encounter (Signed)
-----   Message from Rollo FABIENE Louder sent at 05/23/2024  7:33 AM EDT ----- Leontine-   Please tell patient that her renal function is stable. However, her potassium is elevated to 5.8. She should not take her spironolactone  today. It is possible that this is a lab error as she has  never had high potassium in the past. Please order repeat BMP to be collected today Note- call goes to her POA De[facility]   Thanks KJ  ----- Message ----- From: Rebecka Memos Lab Results In Sent: 05/23/2024   6:37 AM EDT To: Rollo JONELLE Louder, PA-C

## 2024-05-29 DIAGNOSIS — I5022 Chronic systolic (congestive) heart failure: Secondary | ICD-10-CM | POA: Diagnosis not present

## 2024-05-29 DIAGNOSIS — I455 Other specified heart block: Secondary | ICD-10-CM | POA: Diagnosis not present

## 2024-05-29 DIAGNOSIS — I447 Left bundle-branch block, unspecified: Secondary | ICD-10-CM | POA: Diagnosis not present

## 2024-05-29 LAB — POTASSIUM: Potassium: 5 mmol/L (ref 3.5–5.2)

## 2024-05-29 NOTE — Telephone Encounter (Signed)
 Called [facility] spoke to front desk asked them to message Nursing staff to give office a call back We need to make changes please contact Ronald Londo when staff reached out.

## 2024-05-29 NOTE — Telephone Encounter (Signed)
 Left Message at [facility] nurse desk to call us  back Patient needs to have a potassium level drawn soon. If labs are unable to be drawn today patient will need to hold Spirolactone until labs are able to be drawn.

## 2024-05-29 NOTE — Telephone Encounter (Signed)
-----   Message from Rollo FABIENE Louder sent at 05/23/2024  7:33 AM EDT ----- Leontine-   Please tell patient that her renal function is stable. However, her potassium is elevated to 5.8. She should not take her spironolactone  today. It is possible that this is a lab error as she has  never had high potassium in the past. Please order repeat BMP to be collected today Note- call goes to her POA De[facility]   Thanks KJ  ----- Message ----- From: Rebecka Memos Lab Results In Sent: 05/23/2024   6:37 AM EDT To: Rollo JONELLE Louder, PA-C

## 2024-05-30 NOTE — Telephone Encounter (Signed)
 Left message to call back

## 2024-05-30 NOTE — Telephone Encounter (Signed)
-----   Message from Rollo FABIENE Louder sent at 05/30/2024  7:40 AM EDT ----- Please tell patient that her potassium is within normal limits at 5.0. This is at the upper limit of normal, so we will stop her potassium chloride  40 mEq daily. OK to continue spironolactone . Repeat  BMP in 2 weeks   Thanks,  KJ    ----- Message ----- From: Interface, [facility] Lab Results In Sent: 05/29/2024  11:35 PM EDT To: Rollo JONELLE Louder, PA-C

## 2024-06-01 NOTE — Telephone Encounter (Signed)
 Caller Laura [name]) returned staff call regarding results.

## 2024-06-01 NOTE — Telephone Encounter (Signed)
 Called patient advised of below they verbalized understanding.

## 2024-08-04 ENCOUNTER — Encounter (HOSPITAL_BASED_OUTPATIENT_CLINIC_OR_DEPARTMENT_OTHER): Payer: Self-pay | Admitting: *Deleted

## 2024-08-07 ENCOUNTER — Encounter: Payer: Self-pay | Admitting: *Deleted

## 2024-08-07 DIAGNOSIS — Z0100 Encounter for examination of eyes and vision without abnormal findings: Secondary | ICD-10-CM | POA: Diagnosis not present

## 2024-08-08 ENCOUNTER — Ambulatory Visit: Attending: Cardiology | Admitting: Cardiology
